# Patient Record
Sex: Male | Born: 1940 | Race: White | Hispanic: No | Marital: Married | Smoking: Former smoker
Health system: Southern US, Community
[De-identification: ages and names within clinical notes are randomized; demographics above are authoritative.]

## PROBLEM LIST (undated history)

## (undated) DIAGNOSIS — J189 Pneumonia, unspecified organism: Secondary | ICD-10-CM

## (undated) DIAGNOSIS — K259 Gastric ulcer, unspecified as acute or chronic, without hemorrhage or perforation: Secondary | ICD-10-CM

## (undated) DIAGNOSIS — N2589 Other disorders resulting from impaired renal tubular function: Secondary | ICD-10-CM

## (undated) DIAGNOSIS — I82409 Acute embolism and thrombosis of unspecified deep veins of unspecified lower extremity: Secondary | ICD-10-CM

## (undated) DIAGNOSIS — I10 Essential (primary) hypertension: Secondary | ICD-10-CM

## (undated) DIAGNOSIS — T7840XA Allergy, unspecified, initial encounter: Secondary | ICD-10-CM

## (undated) DIAGNOSIS — I219 Acute myocardial infarction, unspecified: Secondary | ICD-10-CM

## (undated) DIAGNOSIS — J449 Chronic obstructive pulmonary disease, unspecified: Secondary | ICD-10-CM

## (undated) DIAGNOSIS — R011 Cardiac murmur, unspecified: Secondary | ICD-10-CM

## (undated) DIAGNOSIS — M810 Age-related osteoporosis without current pathological fracture: Secondary | ICD-10-CM

## (undated) DIAGNOSIS — IMO0001 Reserved for inherently not codable concepts without codable children: Secondary | ICD-10-CM

## (undated) DIAGNOSIS — N184 Chronic kidney disease, stage 4 (severe): Secondary | ICD-10-CM

## (undated) DIAGNOSIS — I209 Angina pectoris, unspecified: Secondary | ICD-10-CM

## (undated) DIAGNOSIS — F419 Anxiety disorder, unspecified: Secondary | ICD-10-CM

## (undated) DIAGNOSIS — I2699 Other pulmonary embolism without acute cor pulmonale: Secondary | ICD-10-CM

## (undated) DIAGNOSIS — G709 Myoneural disorder, unspecified: Secondary | ICD-10-CM

## (undated) DIAGNOSIS — I059 Rheumatic mitral valve disease, unspecified: Secondary | ICD-10-CM

## (undated) DIAGNOSIS — D649 Anemia, unspecified: Secondary | ICD-10-CM

## (undated) DIAGNOSIS — I639 Cerebral infarction, unspecified: Secondary | ICD-10-CM

## (undated) DIAGNOSIS — K909 Intestinal malabsorption, unspecified: Secondary | ICD-10-CM

## (undated) DIAGNOSIS — H269 Unspecified cataract: Secondary | ICD-10-CM

## (undated) DIAGNOSIS — I2510 Atherosclerotic heart disease of native coronary artery without angina pectoris: Secondary | ICD-10-CM

## (undated) DIAGNOSIS — D689 Coagulation defect, unspecified: Secondary | ICD-10-CM

## (undated) DIAGNOSIS — Z8719 Personal history of other diseases of the digestive system: Secondary | ICD-10-CM

## (undated) DIAGNOSIS — Z5189 Encounter for other specified aftercare: Secondary | ICD-10-CM

## (undated) DIAGNOSIS — R17 Unspecified jaundice: Secondary | ICD-10-CM

## (undated) DIAGNOSIS — I509 Heart failure, unspecified: Secondary | ICD-10-CM

## (undated) DIAGNOSIS — R0602 Shortness of breath: Secondary | ICD-10-CM

## (undated) DIAGNOSIS — D500 Iron deficiency anemia secondary to blood loss (chronic): Secondary | ICD-10-CM

## (undated) DIAGNOSIS — E7800 Pure hypercholesterolemia, unspecified: Secondary | ICD-10-CM

## (undated) DIAGNOSIS — D631 Anemia in chronic kidney disease: Secondary | ICD-10-CM

## (undated) DIAGNOSIS — G4730 Sleep apnea, unspecified: Secondary | ICD-10-CM

## (undated) HISTORY — PX: CATARACT EXTRACTION W/ INTRAOCULAR LENS  IMPLANT, BILATERAL: SHX1307

## (undated) HISTORY — DX: Myoneural disorder, unspecified: G70.9

## (undated) HISTORY — DX: Coagulation defect, unspecified: D68.9

## (undated) HISTORY — DX: Iron deficiency anemia secondary to blood loss (chronic): D50.0

## (undated) HISTORY — DX: Intestinal malabsorption, unspecified: K90.9

## (undated) HISTORY — DX: Atherosclerotic heart disease of native coronary artery without angina pectoris: I25.10

## (undated) HISTORY — DX: Chronic obstructive pulmonary disease, unspecified: J44.9

## (undated) HISTORY — DX: Chronic kidney disease, stage 4 (severe): N18.4

## (undated) HISTORY — DX: Anemia in chronic kidney disease: D63.1

## (undated) HISTORY — DX: Age-related osteoporosis without current pathological fracture: M81.0

## (undated) HISTORY — DX: Allergy, unspecified, initial encounter: T78.40XA

## (undated) HISTORY — DX: Other disorders resulting from impaired renal tubular function: N25.89

## (undated) HISTORY — DX: Anxiety disorder, unspecified: F41.9

## (undated) HISTORY — DX: Unspecified cataract: H26.9

---

## 1985-12-03 DIAGNOSIS — I639 Cerebral infarction, unspecified: Secondary | ICD-10-CM

## 1985-12-03 HISTORY — DX: Cerebral infarction, unspecified: I63.9

## 1986-12-03 DIAGNOSIS — I219 Acute myocardial infarction, unspecified: Secondary | ICD-10-CM

## 1986-12-03 DIAGNOSIS — I2699 Other pulmonary embolism without acute cor pulmonale: Secondary | ICD-10-CM

## 1986-12-03 HISTORY — PX: CORONARY ANGIOPLASTY: SHX604

## 1986-12-03 HISTORY — DX: Other pulmonary embolism without acute cor pulmonale: I26.99

## 1986-12-03 HISTORY — DX: Acute myocardial infarction, unspecified: I21.9

## 1991-12-04 HISTORY — PX: EXPLORATORY LAPAROTOMY W/ BOWEL RESECTION: SHX1544

## 1991-12-04 HISTORY — PX: TRACHEOSTOMY: SUR1362

## 2001-12-03 HISTORY — PX: CORONARY ANGIOPLASTY WITH STENT PLACEMENT: SHX49

## 2002-08-11 ENCOUNTER — Inpatient Hospital Stay (HOSPITAL_COMMUNITY): Admission: EM | Admit: 2002-08-11 | Discharge: 2002-08-20 | Payer: Self-pay | Admitting: Cardiology

## 2002-09-23 ENCOUNTER — Inpatient Hospital Stay (HOSPITAL_COMMUNITY): Admission: EM | Admit: 2002-09-23 | Discharge: 2002-10-05 | Payer: Self-pay | Admitting: Cardiology

## 2002-09-26 ENCOUNTER — Encounter: Payer: Self-pay | Admitting: Cardiology

## 2002-09-27 ENCOUNTER — Encounter: Payer: Self-pay | Admitting: *Deleted

## 2006-02-12 ENCOUNTER — Ambulatory Visit (HOSPITAL_COMMUNITY): Admission: RE | Admit: 2006-02-12 | Discharge: 2006-02-12 | Payer: Self-pay | Admitting: Ophthalmology

## 2007-06-11 ENCOUNTER — Ambulatory Visit: Payer: Self-pay | Admitting: Physician Assistant

## 2007-06-13 ENCOUNTER — Ambulatory Visit: Payer: Self-pay | Admitting: Cardiology

## 2007-06-19 ENCOUNTER — Ambulatory Visit: Payer: Self-pay | Admitting: Cardiology

## 2007-07-01 ENCOUNTER — Ambulatory Visit: Payer: Self-pay | Admitting: Cardiology

## 2007-07-08 ENCOUNTER — Ambulatory Visit: Payer: Self-pay | Admitting: Cardiology

## 2007-07-30 ENCOUNTER — Ambulatory Visit: Payer: Self-pay | Admitting: Cardiology

## 2007-08-13 ENCOUNTER — Ambulatory Visit: Payer: Self-pay | Admitting: Cardiology

## 2007-08-21 ENCOUNTER — Ambulatory Visit: Payer: Self-pay | Admitting: Cardiology

## 2007-09-03 ENCOUNTER — Ambulatory Visit: Payer: Self-pay | Admitting: Cardiology

## 2007-09-18 ENCOUNTER — Ambulatory Visit: Payer: Self-pay | Admitting: Cardiology

## 2007-09-25 ENCOUNTER — Ambulatory Visit: Payer: Self-pay | Admitting: Cardiology

## 2007-10-09 ENCOUNTER — Ambulatory Visit: Payer: Self-pay | Admitting: Cardiology

## 2007-11-03 ENCOUNTER — Ambulatory Visit: Payer: Self-pay | Admitting: Cardiology

## 2007-11-17 ENCOUNTER — Ambulatory Visit: Payer: Self-pay | Admitting: Cardiology

## 2007-12-15 ENCOUNTER — Ambulatory Visit: Payer: Self-pay | Admitting: Cardiology

## 2007-12-29 ENCOUNTER — Ambulatory Visit: Payer: Self-pay | Admitting: Cardiology

## 2008-01-26 ENCOUNTER — Ambulatory Visit: Payer: Self-pay | Admitting: Cardiology

## 2008-02-16 ENCOUNTER — Ambulatory Visit: Payer: Self-pay | Admitting: Cardiology

## 2008-02-23 ENCOUNTER — Ambulatory Visit: Payer: Self-pay | Admitting: Cardiology

## 2008-03-22 ENCOUNTER — Encounter: Payer: Self-pay | Admitting: Cardiology

## 2008-03-23 ENCOUNTER — Ambulatory Visit: Payer: Self-pay | Admitting: Cardiology

## 2008-04-20 ENCOUNTER — Ambulatory Visit: Payer: Self-pay | Admitting: Cardiology

## 2008-05-19 ENCOUNTER — Ambulatory Visit: Payer: Self-pay | Admitting: Cardiology

## 2008-06-16 ENCOUNTER — Ambulatory Visit: Payer: Self-pay | Admitting: Cardiology

## 2008-07-08 ENCOUNTER — Ambulatory Visit: Payer: Self-pay | Admitting: Cardiology

## 2008-08-05 ENCOUNTER — Ambulatory Visit: Payer: Self-pay | Admitting: Cardiology

## 2008-09-03 ENCOUNTER — Ambulatory Visit: Payer: Self-pay | Admitting: Cardiology

## 2008-09-21 ENCOUNTER — Ambulatory Visit: Payer: Self-pay | Admitting: Cardiology

## 2008-09-26 ENCOUNTER — Encounter: Payer: Self-pay | Admitting: Cardiology

## 2008-09-27 ENCOUNTER — Encounter: Payer: Self-pay | Admitting: Cardiology

## 2008-10-11 ENCOUNTER — Encounter: Payer: Self-pay | Admitting: Cardiology

## 2008-10-19 ENCOUNTER — Ambulatory Visit: Payer: Self-pay | Admitting: Cardiology

## 2008-10-26 ENCOUNTER — Ambulatory Visit: Payer: Self-pay | Admitting: Cardiology

## 2008-10-27 ENCOUNTER — Encounter: Payer: Self-pay | Admitting: Cardiology

## 2008-11-05 ENCOUNTER — Ambulatory Visit: Payer: Self-pay | Admitting: Cardiology

## 2008-11-23 ENCOUNTER — Ambulatory Visit: Payer: Self-pay | Admitting: Cardiology

## 2008-12-03 DIAGNOSIS — J189 Pneumonia, unspecified organism: Secondary | ICD-10-CM

## 2008-12-03 HISTORY — DX: Pneumonia, unspecified organism: J18.9

## 2008-12-13 ENCOUNTER — Encounter: Payer: Self-pay | Admitting: Cardiology

## 2008-12-24 ENCOUNTER — Ambulatory Visit: Payer: Self-pay | Admitting: Cardiology

## 2009-01-21 ENCOUNTER — Ambulatory Visit: Payer: Self-pay | Admitting: Cardiology

## 2009-02-18 ENCOUNTER — Ambulatory Visit: Payer: Self-pay | Admitting: Cardiology

## 2009-03-17 ENCOUNTER — Encounter: Payer: Self-pay | Admitting: Cardiology

## 2009-03-22 ENCOUNTER — Ambulatory Visit: Payer: Self-pay | Admitting: Cardiology

## 2009-04-26 ENCOUNTER — Ambulatory Visit: Payer: Self-pay | Admitting: Cardiology

## 2009-05-10 ENCOUNTER — Ambulatory Visit: Payer: Self-pay

## 2009-05-27 ENCOUNTER — Ambulatory Visit: Payer: Self-pay

## 2009-06-24 ENCOUNTER — Ambulatory Visit: Payer: Self-pay

## 2009-07-13 ENCOUNTER — Encounter: Payer: Self-pay | Admitting: Cardiology

## 2009-07-18 ENCOUNTER — Encounter: Payer: Self-pay | Admitting: *Deleted

## 2009-07-26 ENCOUNTER — Ambulatory Visit: Payer: Self-pay | Admitting: Cardiology

## 2009-07-26 LAB — CONVERTED CEMR LAB
POC INR: 2.2
Prothrombin Time: 18.1 s

## 2009-08-16 DIAGNOSIS — I959 Hypotension, unspecified: Secondary | ICD-10-CM

## 2009-08-16 DIAGNOSIS — I2510 Atherosclerotic heart disease of native coronary artery without angina pectoris: Secondary | ICD-10-CM

## 2009-08-16 DIAGNOSIS — E785 Hyperlipidemia, unspecified: Secondary | ICD-10-CM

## 2009-08-23 ENCOUNTER — Ambulatory Visit: Payer: Self-pay | Admitting: Cardiology

## 2009-08-23 LAB — CONVERTED CEMR LAB: POC INR: 4.3

## 2009-09-09 ENCOUNTER — Ambulatory Visit: Payer: Self-pay | Admitting: Cardiology

## 2009-09-09 LAB — CONVERTED CEMR LAB: POC INR: 4.1

## 2009-09-20 ENCOUNTER — Ambulatory Visit: Payer: Self-pay | Admitting: Cardiology

## 2009-09-20 LAB — CONVERTED CEMR LAB: POC INR: 3.3

## 2009-09-27 ENCOUNTER — Encounter: Payer: Self-pay | Admitting: Cardiology

## 2009-10-11 ENCOUNTER — Ambulatory Visit: Payer: Self-pay | Admitting: Cardiology

## 2009-10-11 ENCOUNTER — Encounter (INDEPENDENT_AMBULATORY_CARE_PROVIDER_SITE_OTHER): Payer: Self-pay | Admitting: *Deleted

## 2009-10-11 ENCOUNTER — Encounter: Payer: Self-pay | Admitting: Cardiology

## 2009-10-11 DIAGNOSIS — N2589 Other disorders resulting from impaired renal tubular function: Secondary | ICD-10-CM

## 2009-10-11 DIAGNOSIS — I82409 Acute embolism and thrombosis of unspecified deep veins of unspecified lower extremity: Secondary | ICD-10-CM | POA: Insufficient documentation

## 2009-10-11 DIAGNOSIS — K55059 Acute (reversible) ischemia of intestine, part and extent unspecified: Secondary | ICD-10-CM

## 2009-10-11 DIAGNOSIS — J984 Other disorders of lung: Secondary | ICD-10-CM | POA: Insufficient documentation

## 2009-10-11 DIAGNOSIS — E119 Type 2 diabetes mellitus without complications: Secondary | ICD-10-CM | POA: Insufficient documentation

## 2009-10-11 DIAGNOSIS — I259 Chronic ischemic heart disease, unspecified: Secondary | ICD-10-CM | POA: Insufficient documentation

## 2009-10-11 DIAGNOSIS — G4733 Obstructive sleep apnea (adult) (pediatric): Secondary | ICD-10-CM | POA: Insufficient documentation

## 2009-10-11 DIAGNOSIS — I2510 Atherosclerotic heart disease of native coronary artery without angina pectoris: Secondary | ICD-10-CM | POA: Insufficient documentation

## 2009-10-11 DIAGNOSIS — Z86718 Personal history of other venous thrombosis and embolism: Secondary | ICD-10-CM

## 2009-10-12 ENCOUNTER — Encounter: Payer: Self-pay | Admitting: Cardiology

## 2009-10-20 ENCOUNTER — Encounter (INDEPENDENT_AMBULATORY_CARE_PROVIDER_SITE_OTHER): Payer: Self-pay | Admitting: *Deleted

## 2009-11-08 ENCOUNTER — Ambulatory Visit: Payer: Self-pay | Admitting: Cardiology

## 2009-11-08 LAB — CONVERTED CEMR LAB: POC INR: 3.5

## 2009-11-15 ENCOUNTER — Ambulatory Visit: Payer: Self-pay | Admitting: Cardiology

## 2009-11-15 ENCOUNTER — Encounter: Payer: Self-pay | Admitting: Cardiology

## 2009-11-16 ENCOUNTER — Encounter: Payer: Self-pay | Admitting: Cardiology

## 2009-11-22 ENCOUNTER — Encounter (INDEPENDENT_AMBULATORY_CARE_PROVIDER_SITE_OTHER): Payer: Self-pay | Admitting: *Deleted

## 2009-12-09 ENCOUNTER — Ambulatory Visit: Payer: Self-pay | Admitting: Cardiology

## 2009-12-09 LAB — CONVERTED CEMR LAB: POC INR: 2.2

## 2010-01-06 ENCOUNTER — Ambulatory Visit: Payer: Self-pay | Admitting: Cardiology

## 2010-01-06 LAB — CONVERTED CEMR LAB: POC INR: 2.4

## 2010-01-25 ENCOUNTER — Encounter: Payer: Self-pay | Admitting: Cardiology

## 2010-02-03 ENCOUNTER — Ambulatory Visit: Payer: Self-pay | Admitting: Cardiology

## 2010-02-03 LAB — CONVERTED CEMR LAB: POC INR: 2.4

## 2010-03-03 ENCOUNTER — Ambulatory Visit: Payer: Self-pay | Admitting: Cardiology

## 2010-03-03 LAB — CONVERTED CEMR LAB: POC INR: 1.4

## 2010-03-17 ENCOUNTER — Ambulatory Visit: Payer: Self-pay | Admitting: Cardiology

## 2010-04-14 ENCOUNTER — Ambulatory Visit: Payer: Self-pay | Admitting: Cardiology

## 2010-04-14 LAB — CONVERTED CEMR LAB: POC INR: 2.6

## 2010-04-17 ENCOUNTER — Encounter: Payer: Self-pay | Admitting: Physician Assistant

## 2010-05-03 ENCOUNTER — Encounter: Payer: Self-pay | Admitting: Cardiology

## 2010-05-12 ENCOUNTER — Ambulatory Visit: Payer: Self-pay | Admitting: Cardiology

## 2010-06-09 ENCOUNTER — Ambulatory Visit: Payer: Self-pay | Admitting: Cardiology

## 2010-07-14 ENCOUNTER — Ambulatory Visit: Payer: Self-pay | Admitting: Cardiology

## 2010-08-04 ENCOUNTER — Ambulatory Visit: Payer: Self-pay | Admitting: Cardiology

## 2010-08-04 LAB — CONVERTED CEMR LAB: POC INR: 2.7

## 2010-08-23 ENCOUNTER — Encounter: Payer: Self-pay | Admitting: Cardiology

## 2010-09-05 ENCOUNTER — Ambulatory Visit: Payer: Self-pay | Admitting: Cardiology

## 2010-09-05 LAB — CONVERTED CEMR LAB: POC INR: 3.3

## 2010-10-02 ENCOUNTER — Telehealth (INDEPENDENT_AMBULATORY_CARE_PROVIDER_SITE_OTHER): Payer: Self-pay | Admitting: *Deleted

## 2010-10-03 ENCOUNTER — Ambulatory Visit: Payer: Self-pay | Admitting: Cardiology

## 2010-10-13 ENCOUNTER — Ambulatory Visit: Payer: Self-pay | Admitting: Cardiology

## 2010-10-13 ENCOUNTER — Encounter: Payer: Self-pay | Admitting: Cardiology

## 2010-10-13 DIAGNOSIS — I4891 Unspecified atrial fibrillation: Secondary | ICD-10-CM

## 2010-10-20 ENCOUNTER — Ambulatory Visit: Payer: Self-pay | Admitting: Cardiology

## 2010-10-23 ENCOUNTER — Ambulatory Visit: Payer: Self-pay | Admitting: Cardiology

## 2010-10-27 ENCOUNTER — Ambulatory Visit: Payer: Self-pay | Admitting: Cardiology

## 2010-10-31 ENCOUNTER — Encounter (INDEPENDENT_AMBULATORY_CARE_PROVIDER_SITE_OTHER): Payer: Self-pay | Admitting: *Deleted

## 2010-10-31 ENCOUNTER — Encounter: Payer: Self-pay | Admitting: Cardiology

## 2010-11-02 HISTORY — PX: OTHER SURGICAL HISTORY: SHX169

## 2010-11-03 ENCOUNTER — Ambulatory Visit: Payer: Self-pay | Admitting: Cardiology

## 2010-11-03 LAB — CONVERTED CEMR LAB: POC INR: 3

## 2010-11-07 ENCOUNTER — Ambulatory Visit: Payer: Self-pay | Admitting: Cardiology

## 2010-11-07 LAB — CONVERTED CEMR LAB: POC INR: 2.7

## 2010-11-14 ENCOUNTER — Ambulatory Visit: Payer: Self-pay | Admitting: Cardiology

## 2010-11-14 LAB — CONVERTED CEMR LAB: POC INR: 3.3

## 2010-11-24 ENCOUNTER — Encounter (INDEPENDENT_AMBULATORY_CARE_PROVIDER_SITE_OTHER): Payer: Self-pay | Admitting: *Deleted

## 2010-11-24 ENCOUNTER — Ambulatory Visit: Payer: Self-pay | Admitting: Cardiology

## 2010-11-24 ENCOUNTER — Encounter: Payer: Self-pay | Admitting: Physician Assistant

## 2010-11-24 DIAGNOSIS — I5022 Chronic systolic (congestive) heart failure: Secondary | ICD-10-CM | POA: Insufficient documentation

## 2010-11-26 ENCOUNTER — Encounter: Payer: Self-pay | Admitting: Cardiology

## 2010-11-27 ENCOUNTER — Encounter: Payer: Self-pay | Admitting: Cardiology

## 2010-11-28 ENCOUNTER — Encounter: Payer: Self-pay | Admitting: Cardiology

## 2010-11-29 ENCOUNTER — Encounter: Payer: Self-pay | Admitting: Cardiology

## 2010-11-30 ENCOUNTER — Encounter: Payer: Self-pay | Admitting: Cardiology

## 2010-12-01 ENCOUNTER — Ambulatory Visit: Payer: Self-pay | Admitting: Cardiology

## 2010-12-02 ENCOUNTER — Encounter: Payer: Self-pay | Admitting: Cardiology

## 2010-12-05 ENCOUNTER — Encounter: Payer: Self-pay | Admitting: Cardiology

## 2010-12-05 ENCOUNTER — Telehealth (INDEPENDENT_AMBULATORY_CARE_PROVIDER_SITE_OTHER): Payer: Self-pay | Admitting: *Deleted

## 2010-12-07 ENCOUNTER — Ambulatory Visit: Admission: RE | Admit: 2010-12-07 | Discharge: 2010-12-07 | Payer: Self-pay | Source: Home / Self Care

## 2010-12-07 ENCOUNTER — Encounter: Payer: Self-pay | Admitting: Internal Medicine

## 2010-12-07 ENCOUNTER — Encounter: Payer: Self-pay | Admitting: Cardiology

## 2010-12-08 ENCOUNTER — Telehealth: Payer: Self-pay | Admitting: Cardiology

## 2010-12-08 ENCOUNTER — Encounter: Payer: Self-pay | Admitting: Cardiology

## 2010-12-11 ENCOUNTER — Encounter: Payer: Self-pay | Admitting: Cardiology

## 2010-12-11 ENCOUNTER — Telehealth: Payer: Self-pay | Admitting: Cardiology

## 2010-12-11 LAB — CONVERTED CEMR LAB: POC INR: 2.9

## 2010-12-12 ENCOUNTER — Encounter: Payer: Self-pay | Admitting: Cardiology

## 2010-12-13 ENCOUNTER — Encounter: Payer: Self-pay | Admitting: Cardiology

## 2010-12-13 ENCOUNTER — Telehealth (INDEPENDENT_AMBULATORY_CARE_PROVIDER_SITE_OTHER): Payer: Self-pay | Admitting: *Deleted

## 2010-12-13 ENCOUNTER — Encounter: Payer: Self-pay | Admitting: Physician Assistant

## 2010-12-14 ENCOUNTER — Telehealth: Payer: Self-pay | Admitting: Cardiology

## 2010-12-14 ENCOUNTER — Encounter: Payer: Self-pay | Admitting: Cardiology

## 2010-12-14 LAB — CONVERTED CEMR LAB: POC INR: 2.8

## 2010-12-15 ENCOUNTER — Encounter: Payer: Self-pay | Admitting: Cardiology

## 2010-12-20 ENCOUNTER — Encounter: Payer: Self-pay | Admitting: Cardiology

## 2010-12-20 ENCOUNTER — Telehealth: Payer: Self-pay | Admitting: Cardiology

## 2010-12-21 ENCOUNTER — Encounter: Payer: Self-pay | Admitting: Cardiology

## 2010-12-21 ENCOUNTER — Ambulatory Visit
Admission: RE | Admit: 2010-12-21 | Discharge: 2010-12-21 | Payer: Self-pay | Source: Home / Self Care | Attending: Cardiology | Admitting: Cardiology

## 2010-12-21 ENCOUNTER — Encounter (INDEPENDENT_AMBULATORY_CARE_PROVIDER_SITE_OTHER): Payer: Self-pay | Admitting: *Deleted

## 2010-12-21 DIAGNOSIS — Z9861 Coronary angioplasty status: Secondary | ICD-10-CM | POA: Insufficient documentation

## 2010-12-21 DIAGNOSIS — R0689 Other abnormalities of breathing: Secondary | ICD-10-CM

## 2010-12-21 DIAGNOSIS — R0600 Dyspnea, unspecified: Secondary | ICD-10-CM | POA: Insufficient documentation

## 2010-12-22 ENCOUNTER — Encounter: Payer: Self-pay | Admitting: Cardiology

## 2010-12-22 ENCOUNTER — Telehealth: Payer: Self-pay | Admitting: Cardiology

## 2010-12-26 ENCOUNTER — Telehealth (INDEPENDENT_AMBULATORY_CARE_PROVIDER_SITE_OTHER): Payer: Self-pay | Admitting: *Deleted

## 2010-12-28 ENCOUNTER — Encounter (INDEPENDENT_AMBULATORY_CARE_PROVIDER_SITE_OTHER): Payer: Self-pay | Admitting: *Deleted

## 2010-12-28 ENCOUNTER — Ambulatory Visit
Admission: RE | Admit: 2010-12-28 | Discharge: 2010-12-28 | Payer: Self-pay | Source: Home / Self Care | Attending: Cardiology | Admitting: Cardiology

## 2010-12-28 ENCOUNTER — Inpatient Hospital Stay: Admission: AD | Admit: 2010-12-28 | Payer: Self-pay | Source: Home / Self Care | Admitting: Cardiology

## 2010-12-29 ENCOUNTER — Encounter: Payer: Self-pay | Admitting: Cardiology

## 2010-12-31 LAB — CONVERTED CEMR LAB
POC INR: 2.1
POC INR: 2.5
POC INR: 3.3

## 2011-01-01 ENCOUNTER — Encounter: Payer: Self-pay | Admitting: Cardiology

## 2011-01-02 ENCOUNTER — Ambulatory Visit: Admission: RE | Admit: 2011-01-02 | Discharge: 2011-01-02 | Payer: Self-pay | Source: Home / Self Care

## 2011-01-02 ENCOUNTER — Encounter: Payer: Self-pay | Admitting: Cardiology

## 2011-01-02 NOTE — Medication Information (Signed)
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 no    Recent/future dental: no  Any missed doses?: no       Is patient compliant with meds? yes       Allergies: 1)  ! Tylenol 2)  Sulfa  Anticoagulation Management History:      The patient is taking warfarin and comes in today for a routine follow up visit.  Positive risk factors for bleeding include an age of 72 years or older and presence of serious comorbidities.  The bleeding index is 'intermediate risk'.  Positive CHADS2 values include History of Diabetes.  Negative CHADS2 values include Age > 104 years old.  The start date was 12/03/1985.  Anticoagulation responsible provider: Diona Browner MD, Remi Deter.  INR POC: 1.4.  Cuvette Lot#: [id number].  Exp: 10/11.    Anticoagulation Management Assessment/Plan:      The patient's current anticoagulation dose is Warfarin sodium 5 mg tabs: take as directed per coumadin clinic.  The target INR is 2 - 3.  The next INR is due 03/17/2010.  Anticoagulation instructions were given to patient.  Results were reviewed/authorized by Vashti Hey, RN.  He was notified by Vashti Hey RN.         Prior Anticoagulation Instructions: INR 2.4 Continue coumadin 2.5mg  once daily except 5mg  on Mondays and Fridays  Current Anticoagulation Instructions: INR 1.4 Take coumadin 10mg  tonight then resume 2.5mg  once daily except 5mg  on Mondays and Fridays States he had been taken off ASA by Dermatologist but has started back on it recently.

## 2011-01-02 NOTE — Medication Information (Signed)
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 no    Recent/future dental: no  Any missed doses?: no       Is patient compliant with meds? yes       Allergies: 1)  ! Tylenol 2)  Sulfa  Anticoagulation Management History:      The patient is taking warfarin and comes in today for a routine follow up visit.  Positive risk factors for bleeding include an age of 23 years or older and presence of serious comorbidities.  The bleeding index is 'intermediate risk'.  Positive CHADS2 values include History of Diabetes.  Negative CHADS2 values include Age > 52 years old.  The start date was 12/03/1985.  Anticoagulation responsible provider: Diona Browner MD, Remi Deter.  INR POC: 2.2.  Cuvette Lot#: [id number].  Exp: 10/11.    Anticoagulation Management Assessment/Plan:      The patient's current anticoagulation dose is Warfarin sodium 5 mg tabs: take as directed per coumadin clinic.  The target INR is 2 - 3.  The next INR is due 01/06/2010.  Anticoagulation instructions were given to patient.  Results were reviewed/authorized by Vashti Hey, RN.  He was notified by Vashti Hey RN.         Prior Anticoagulation Instructions: INR 3.5 Hold coumadin x 2 then resume 2.5mg  once daily except 5mg  on Mondays and Fridays Has another 1 1/2 weeks on Antibiotic  Current Anticoagulation Instructions: INR 2.2  Continue coumadin 2.5mg  once daily except 5mg  on Mondays and Fridays

## 2011-01-02 NOTE — Medication Information (Signed)
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 no    Recent/future dental: no  Any missed doses?: no       Is patient compliant with meds? yes       Allergies: 1)  ! Tylenol 2)  Sulfa  Anticoagulation Management History:      The patient is taking warfarin and comes in today for a routine follow up visit.  Positive risk factors for bleeding include an age of 10 years or older and presence of serious comorbidities.  The bleeding index is 'intermediate risk'.  Positive CHADS2 values include History of Diabetes.  Negative CHADS2 values include Age > 87 years old.  The start date was 12/03/1985.  Anticoagulation responsible provider: Diona Browner MD, Remi Deter.  INR POC: 1.7.  Cuvette Lot#: [id number].  Exp: 10/11.    Anticoagulation Management Assessment/Plan:      The patient's current anticoagulation dose is Warfarin sodium 5 mg tabs: take as directed per coumadin clinic.  The target INR is 2 - 3.  The next INR is due 06/30/2010.  Anticoagulation instructions were given to patient.  Results were reviewed/authorized by Vashti Hey, RN.  He was notified by Vashti Hey RN.         Prior Anticoagulation Instructions: INR 2.0 Continue coumadin 2.5mg  once daily except 5mg  on Mondays and Fridays  Current Anticoagulation Instructions: INR 1.7 Take coumadin 1 1/2 tablets tonight, 1 tablet tomorrow night then resume 1/2 tablet once daily except 1 tablet on Mondays and Fridays

## 2011-01-02 NOTE — Progress Notes (Signed)
Summary: sob with exertion      Phone Note Call from Patient   Summary of Call: At beach appro[place] one [place] ago. Got extremely SOB with going up steps.  Had OV with PMD whe he returned.  Did labs and was told to doulble up on fluid meds.  Did feel some better.  Then went back for 2 wk check after that.  Does notice some chest tightness with exertion also.  Not feeing good last couple of months, but seems to be getting worse.   Has OV scheduled for 11/22. Initial call taken by: [name], LPN,  October 02, 2010 11:26 AM  Follow-up for Phone Call        If he gets worse he needs to go to ER. I am not sure where we are  going to work him in before that time. Could you look at schedule and maybe add on split day or [facility] day this week. Not tomorrow, because Gene not here.  Follow-up by: Lewayne Bunting, MD, [facility],  October 02, 2010 2:03 PM  Additional Follow-up for Phone Call Additional follow up Details #1::        Left message to return call.  [name], LPN  October 03, 2010 4:05 PM   Left message to return call.  [name], LPN  October 04, 2010 1:58 PM   patient returned call. He has been out of town Claudette Laws  October 05, 2010 1:41 PM    Additional Follow-up for Phone Call Additional follow up Details #2::    Patient notified.   OV scheduled for Tuesday, November 8 at 8:30.   [name], LPN  October 06, 2010 11:27 AM

## 2011-01-02 NOTE — Progress Notes (Signed)
Summary: BP READINGS  BP READINGS   Imported By: Zachary George 10/27/2010 09:40:07  _____________________________________________________________________  External Attachment:    Type:   Image     Comment:   External Document

## 2011-01-02 NOTE — Medication Information (Signed)
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 no    Recent/future dental: no  Any missed doses?: no       Is patient compliant with meds? yes       Allergies: 1)  ! Tylenol 2)  Sulfa  Anticoagulation Management History:      The patient is taking warfarin and comes in today for a routine follow up visit.  Positive risk factors for bleeding include an age of 9 years or older and presence of serious comorbidities.  The bleeding index is 'intermediate risk'.  Positive CHADS2 values include History of Diabetes.  Negative CHADS2 values include Age > 77 years old.  The start date was 12/03/1985.  Anticoagulation responsible provider: Myrtis Ser MD, Tinnie Gens.  INR POC: 2.6.  Cuvette Lot#: [id number].  Exp: 10/11.    Anticoagulation Management Assessment/Plan:      The patient's current anticoagulation dose is Warfarin sodium 5 mg tabs: take as directed per coumadin clinic.  The target INR is 2 - 3.  The next INR is due 05/12/2010.  Anticoagulation instructions were given to patient.  Results were reviewed/authorized by Vashti Hey, RN.  He was notified by Vashti Hey RN.         Prior Anticoagulation Instructions: INR 2.1 Continue coumadin 2.5mg  once daily except 5mg  on Mondays and Fridays  Current Anticoagulation Instructions: INR 2.6 Continue coumadin 2.5mg  once daily except 5mg  on Mondays and Fridays

## 2011-01-02 NOTE — Miscellaneous (Signed)
Summary: Orders Update  Clinical Lists Changes  Orders: Added new Referral order of 2-D Echocardiogram (2D Echo) - Signed 

## 2011-01-02 NOTE — Medication Information (Signed)
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 no    Recent/future dental: no  Any missed doses?: no       Is patient compliant with meds? yes       Allergies: 1)  ! Tylenol 2)  Sulfa  Anticoagulation Management History:      The patient is taking warfarin and comes in today for a routine follow up visit.  Positive risk factors for bleeding include an age of 72 years or older and presence of serious comorbidities.  The bleeding index is 'intermediate risk'.  Positive CHADS2 values include History of Diabetes.  Negative CHADS2 values include Age > 24 years old.  The start date was 12/03/1985.  Anticoagulation responsible provider: Andee Lineman MD, Michelle Piper.  INR POC: 2.1.  Cuvette Lot#: [id number].  Exp: 10/11.    Anticoagulation Management Assessment/Plan:      The patient's current anticoagulation dose is Warfarin sodium 5 mg tabs: take as directed per coumadin clinic.  The target INR is 2 - 3.  The next INR is due 04/14/2010.  Anticoagulation instructions were given to patient.  Results were reviewed/authorized by Vashti Hey, RN.  He was notified by Vashti Hey RN.         Prior Anticoagulation Instructions: INR 1.4 Take coumadin 10mg  tonight then resume 2.5mg  once daily except 5mg  on Mondays and Fridays States he had been taken off ASA by Dermatologist but has started back on it recently.  Current Anticoagulation Instructions: INR 2.1 Continue coumadin 2.5mg  once daily except 5mg  on Mondays and Fridays

## 2011-01-02 NOTE — Medication Information (Signed)
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 no    Recent/future dental: no  Any missed doses?: no       Is patient compliant with meds? yes       Allergies: 1)  ! Tylenol 2)  Sulfa  Anticoagulation Management History:      The patient is taking warfarin and comes in today for a routine follow up visit.  Positive risk factors for bleeding include an age of 72 years or older and presence of serious comorbidities.  The bleeding index is 'intermediate risk'.  Positive CHADS2 values include History of Diabetes.  Negative CHADS2 values include Age > 85 years old.  The start date was 12/03/1985.  Anticoagulation responsible provider: Andee Lineman MD, Michelle Piper.  INR POC: 4.1.  Cuvette Lot#: [id number].  Exp: 10/11.    Anticoagulation Management Assessment/Plan:      The patient's current anticoagulation dose is Warfarin sodium 5 mg tabs: take as directed per coumadin clinic.  The target INR is 2 - 3.  The next INR is due 10/13/2010.  Anticoagulation instructions were given to patient.  Results were reviewed/authorized by Hayden Hey, RN.  He was notified by Hayden Hey RN.         Prior Anticoagulation Instructions: INR 3.3 Hold coumadin tonight then resume 2.5mg  once daily except 5mg  on M,W,F  Current Anticoagulation Instructions: INR 4.1 Hold coumadin tonight then decrease dose to 2.5mg  once daily except 5mg  on Mondays and Fridays Furosemide dose has been doubled

## 2011-01-02 NOTE — Medication Information (Signed)
Summary: ccr-lr  Anticoagulant Therapy  Managed by: Vashti Hey, RN PCP: Dr. 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 yes       Details: 11/01/10  Started amiodarone 400mg  x 1 month then 200mg  after that  Recent/future dental: no  Any missed doses?: no       Is patient compliant with meds? yes       Allergies: 1)  Sulfa  Anticoagulation Management History:      The patient is taking warfarin and comes in today for a routine follow up visit.  Positive risk factors for bleeding include an age of 25 years or older and presence of serious comorbidities.  The bleeding index is 'intermediate risk'.  Positive CHADS2 values include History of Diabetes.  Negative CHADS2 values include Age > 24 years old.  The start date was 12/03/1985.  Anticoagulation responsible Mayci Haning: Andee Lineman MD, Michelle Piper.  INR POC: 3.0.  Cuvette Lot#: [id number].  Exp: 10/11.    Anticoagulation Management Assessment/Plan:      The patient's current anticoagulation dose is Warfarin sodium 5 mg tabs: take as directed per coumadin clinic.  The target INR is 2 - 3.  The next INR is due 11/07/2010.  Anticoagulation instructions were given to patient.  Results were reviewed/authorized by Vashti Hey, RN.  He was notified by Vashti Hey RN.        Coagulation management information includes: 11/01/10 pending DCCV after 4 weeks on amiodarone and INRs    11/01/10  Started amiodarone 400mg  x 1 month then 200mg  after that.  Prior Anticoagulation Instructions: INR 2.5  ( 1st post DCCV) Continue coumadin 2.5mg  once daily except 5mg   on Mondays and Fridays  Current Anticoagulation Instructions: INR 3.0  (1st on amiodarone) Decrease coumadin to 2.5mg  once daily except 5mg  on Mondays

## 2011-01-02 NOTE — Medication Information (Signed)
Summary: RX Folder/ [facility]  RX Folder/ [facility]   Imported By: Dorise Hiss 04/18/2010 09:24:15  _____________________________________________________________________  External Attachment:    Type:   Image     Comment:   External Document

## 2011-01-02 NOTE — Miscellaneous (Signed)
Summary: Orders Update- cardioversion  Clinical Lists Changes  Orders: Added new Referral order of Cardioversion (Cardioversion) - Signed

## 2011-01-02 NOTE — Medication Information (Signed)
Summary: ccr-lr  Anticoagulant Therapy  Managed by: Vashti Hey, RN PCP: Dr. 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 Site: [place] INR POC 2.7  Dietary changes: no    Health status changes: no    Bleeding/hemorrhagic complications: no    Recent/future hospitalizations: yes       Details: pending DCCV  Any changes in medication regimen? no    Recent/future dental: no  Any missed doses?: no       Is patient compliant with meds? yes       Allergies: 1)  Sulfa  Anticoagulation Management History:      The patient is taking warfarin and comes in today for a routine follow up visit.  Positive risk factors for bleeding include an age of 72 years or older and presence of serious comorbidities.  The bleeding index is 'intermediate risk'.  Positive CHADS2 values include History of Diabetes.  Negative CHADS2 values include Age > 65 years old.  The start date was 12/03/1985.  Anticoagulation responsible provider: Antoine Poche MD, Fayrene Fearing.  INR POC: 2.7.  Cuvette Lot#: [id number].  Exp: 10/11.    Anticoagulation Management Assessment/Plan:      The patient's current anticoagulation dose is Warfarin sodium 5 mg tabs: take as directed per coumadin clinic.  The target INR is 2 - 3.  The next INR is due 11/14/2010.  Anticoagulation instructions were given to patient.  Results were reviewed/authorized by Vashti Hey, RN.  He was notified by Vashti Hey RN.         Prior Anticoagulation Instructions: INR 3.0  (1st on amiodarone) Decrease coumadin to 2.5mg  once daily except 5mg  on Mondays   Current Anticoagulation Instructions: INR 2.7  (2nd DCCV/on amioadrone) Continue coumadin 2.5mg  once daily except 5mg  on Mondays

## 2011-01-02 NOTE — Medication Information (Signed)
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 no    Recent/future dental: no  Any missed doses?: no       Is patient compliant with meds? yes       Allergies: 1)  ! Tylenol 2)  Sulfa  Anticoagulation Management History:      The patient is taking warfarin and comes in today for a routine follow up visit.  Positive risk factors for bleeding include an age of 72 years or older and presence of serious comorbidities.  The bleeding index is 'intermediate risk'.  Positive CHADS2 values include History of Diabetes.  Negative CHADS2 values include Age > 49 years old.  The start date was 12/03/1985.  Anticoagulation responsible provider: Andee Lineman MD, Michelle Piper.  INR POC: 3.3.  Cuvette Lot#: [id number].  Exp: 10/11.    Anticoagulation Management Assessment/Plan:      The patient's current anticoagulation dose is Warfarin sodium 5 mg tabs: take as directed per coumadin clinic.  The target INR is 2 - 3.  The next INR is due 11/03/2010.  Anticoagulation instructions were given to patient.  Results were reviewed/authorized by Vashti Hey, RN.  He was notified by Vashti Hey RN.         Prior Anticoagulation Instructions: INR 4.1 Hold coumadin tonight then decrease dose to 2.5mg  once daily except 5mg  on Mondays and Fridays Furosemide dose has been doubled  Current Anticoagulation Instructions: INR 3.3 Take coumadin 1/2 tablet tonight then resume 1/2 tablet once daily except 1 tablet on Mondays and Fridays

## 2011-01-02 NOTE — Letter (Signed)
Summary: Generic Engineer, agricultural at [facility] [address]   [place], [place] [address]   Phone: [phone]  Fax: [phone]        October 31, 2010 MRN: [id number]    [facility] [address] [place], [place]  [address]      1.  Start Amiodarone 400mg  daily for one month 2.  Decrease to 200mg  daily after above.    3.  Follow PT INR more frequently with amiodarone  4.  Reschedule patient for cardioversion after 4 weeks on Amiodarone. 5.  Simvastatin need to be discontinued due to interaction.  6.  Start Pravachol 40mg  at bedtime  Starting medication on Wednesday, 11/30 #1 PT check on Friday, 12/2           Sincerely,  [name], LPN  This letter has been electronically signed by your physician.

## 2011-01-02 NOTE — Medication Information (Signed)
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 no    Recent/future dental: no  Any missed doses?: no       Is patient compliant with meds? yes       Allergies: 1)  ! Tylenol 2)  Sulfa  Anticoagulation Management History:      The patient is taking warfarin and comes in today for a routine follow up visit.  Positive risk factors for bleeding include an age of 72 years or older and presence of serious comorbidities.  The bleeding index is 'intermediate risk'.  Positive CHADS2 values include History of Diabetes.  Negative CHADS2 values include Age > 98 years old.  The start date was 12/03/1985.  Anticoagulation responsible provider: Diona Browner MD, Remi Deter.  INR POC: 2.0.  Cuvette Lot#: [id number].  Exp: 10/11.    Anticoagulation Management Assessment/Plan:      The patient's current anticoagulation dose is Warfarin sodium 5 mg tabs: take as directed per coumadin clinic.  The target INR is 2 - 3.  The next INR is due 06/09/2010.  Anticoagulation instructions were given to patient.  Results were reviewed/authorized by Vashti Hey, RN.  He was notified by Vashti Hey RN.         Prior Anticoagulation Instructions: INR 2.6 Continue coumadin 2.5mg  once daily except 5mg  on Mondays and Fridays  Current Anticoagulation Instructions: INR 2.0 Continue coumadin 2.5mg  once daily except 5mg  on Mondays and Fridays

## 2011-01-02 NOTE — Medication Information (Signed)
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 yes       Details: Been on ceftin 500mg  bid x 10 days   Finishes today  and benzonatate 100mg  for cough  Recent/future dental: no  Any missed doses?: yes       Is patient compliant with meds? yes       Allergies: 1)  ! Tylenol 2)  Sulfa  Anticoagulation Management History:      The patient is taking warfarin and comes in today for a routine follow up visit.  Positive risk factors for bleeding include an age of 72 years or older and presence of serious comorbidities.  The bleeding index is 'intermediate risk'.  Positive CHADS2 values include History of Diabetes.  Negative CHADS2 values include Age > 66 years old.  The start date was 12/03/1985.  Anticoagulation responsible provider: Diona Browner MD, Remi Deter.  INR POC: 2.4.  Cuvette Lot#: [id number].  Exp: 10/11.    Anticoagulation Management Assessment/Plan:      The patient's current anticoagulation dose is Warfarin sodium 5 mg tabs: take as directed per coumadin clinic.  The target INR is 2 - 3.  The next INR is due 03/03/2010.  Anticoagulation instructions were given to patient.  Results were reviewed/authorized by Vashti Hey, RN.  He was notified by Vashti Hey RN.         Prior Anticoagulation Instructions: INR 2.4 Continue coumadin 2.5mg  once daily except 5mg  on Mondays and Fridays  Current Anticoagulation Instructions: Same as Prior Instructions.

## 2011-01-02 NOTE — Medication Information (Signed)
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 no    Recent/future dental: no  Any missed doses?: no       Is patient compliant with meds? yes       Allergies: 1)  ! Tylenol 2)  Sulfa  Anticoagulation Management History:      The patient is taking warfarin and comes in today for a routine follow up visit.  Positive risk factors for bleeding include an age of 70 years or older and presence of serious comorbidities.  The bleeding index is 'intermediate risk'.  Positive CHADS2 values include History of Diabetes.  Negative CHADS2 values include Age > 25 years old.  The start date was 12/03/1985.  Anticoagulation responsible provider: Myrtis Ser MD, [name].  INR POC: 3.3.  Cuvette Lot#: [id number].  Exp: 10/11.    Anticoagulation Management Assessment/Plan:      The patient's current anticoagulation dose is Warfarin sodium 5 mg tabs: take as directed per coumadin clinic.  The target INR is 2 - 3.  The next INR is due 10/03/2010.  Anticoagulation instructions were given to patient.  Results were reviewed/authorized by Hayden Hey, RN.  He was notified by Hayden Hey RN.         Prior Anticoagulation Instructions: INR 2.7 Continue coumadin 2.5mg  once daily except 5mg  on M,W,F  Current Anticoagulation Instructions: INR 3.3 Hold coumadin tonight then resume 2.5mg  once daily except 5mg  on M,W,F

## 2011-01-02 NOTE — Assessment & Plan Note (Signed)
Summary: ccr per degent  Nurse Visit  Comments EKG done (see scanned in).   INR - 2.5.  Recheck on 12/2.  See also blood pressure readings scanned in. [name], LPN  October 27, 2010 9:33 AM    Allergies: 1)  Sulfa Start amiodarone 400 mg daily for one month then change to 200 mg daily. Follow PT INR more frequently with amiodarone and reschedule patient for cardioversion after 4 weeks on amiodarone.Simvastatin last to be discontinued due to interaction. Start Pravachol 40 mg p.o. at bedtime  Patient notified.   Will send rx to [facility].  Next PT check is 12/2. [name], LPN  October 31, 2010 1:25 PM

## 2011-01-02 NOTE — Miscellaneous (Signed)
Summary: rx - pravastatin, amiodarone  Clinical Lists Changes  Medications: Added new medication of AMIODARONE HCL 400 MG TABS (AMIODARONE HCL) Take 1 tablet by mouth once a day x one month - Signed Changed medication from SIMVASTATIN 40 MG TABS (SIMVASTATIN) Take 1 tablet by mouth at bedtime to PRAVACHOL 40 MG TABS (PRAVASTATIN SODIUM) Take 1 tab by mouth at bedtime - Signed Added new medication of AMIODARONE HCL 200 MG TABS (AMIODARONE HCL) Take 1 tablet by mouth once a day - Signed Rx of AMIODARONE HCL 400 MG TABS (AMIODARONE HCL) Take 1 tablet by mouth once a day x one month;  #30 x 0;  Signed;  Entered by: [name], LPN;  Authorized by: Lewayne Bunting, MD, [facility];  Method used: Electronically to [facility]  [address] [place] [address], [place], [place], [place]  [address], Ph: [phone], Fax: [phone] Rx of PRAVACHOL 40 MG TABS (PRAVASTATIN SODIUM) Take 1 tab by mouth at bedtime;  #30 x 6;  Signed;  Entered by: [name], LPN;  Authorized by: Lewayne Bunting, MD, [facility];  Method used: Electronically to [facility]  [address] [place], [place], [place]  [address], Ph: [phone], Fax: [phone] Rx of AMIODARONE HCL 200 MG TABS (AMIODARONE HCL) Take 1 tablet by mouth once a day;  #30 x 6;  Signed;  Entered by: [name], LPN;  Authorized by: Lewayne Bunting, MD, [facility];  Method used: Faxed to [facility]  [address] [facility], [place], [place], [place]  [address], Ph: [phone], Fax: [phone]    Prescriptions: AMIODARONE HCL 200 MG TABS (AMIODARONE HCL) Take 1 tablet by mouth once a day  #30 x 6   Entered by:   [name], LPN   Authorized by:   Lewayne Bunting, MD, [facility]   Signed by:   [name], LPN on 43/32/9518   Method used:   Faxed to ...       [facility] (retail)       [address]       [place], [place]  [address]       Ph: [phone]       Fax: [phone]   RxID:   [id number] PRAVACHOL 40 MG TABS (PRAVASTATIN SODIUM) Take 1 tab by  mouth at bedtime  #30 x 6   Entered by:   [name], LPN   Authorized by:   Lewayne Bunting, MD, [facility]   Signed by:   [name], LPN on 17/61/6073   Method used:   Electronically to        [facility] (retail)       [address]       [place], [place]  [address]       Ph: [phone]       Fax: [phone]   RxID:   [id number] AMIODARONE HCL 400 MG TABS (AMIODARONE HCL) Take 1 tablet by mouth once a day x one month  #30 x 0   Entered by:   [name], LPN   Authorized by:   Lewayne Bunting, MD, [facility]   Signed by:   [name], LPN on 75/09/2584   Method used:   Electronically to        [facility] (retail)       [address]  [place], [place]  [address]       Ph: [phone]       Fax: [phone]   RxID:   [id number]

## 2011-01-02 NOTE — Miscellaneous (Signed)
Summary: Orders Update - bmet, pt/inr  Clinical Lists Changes  Orders: Added new Test order of T-Basic Metabolic Panel [phone]) - Signed Added new Test order of T-Protime, Auto ([id number]) - Signed

## 2011-01-02 NOTE — Medication Information (Signed)
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 no    Recent/future dental: no  Any missed doses?: no       Is patient compliant with meds? yes       Allergies: 1)  ! Tylenol 2)  Sulfa  Anticoagulation Management History:      The patient is taking warfarin and comes in today for a routine follow up visit.  Positive risk factors for bleeding include an age of 72 years or older and presence of serious comorbidities.  The bleeding index is 'intermediate risk'.  Positive CHADS2 values include History of Diabetes.  Negative CHADS2 values include Age > 72 years old.  The start date was 12/03/1985.  Anticoagulation responsible provider: Myrtis Ser MD, Tinnie Gens.  INR POC: 1.7.  Cuvette Lot#: [id number].  Exp: 10/11.    Anticoagulation Management Assessment/Plan:      The patient's current anticoagulation dose is Warfarin sodium 5 mg tabs: take as directed per coumadin clinic.  The target INR is 2 - 3.  The next INR is due 08/04/2010.  Anticoagulation instructions were given to patient.  Results were reviewed/authorized by Vashti Hey, RN.  He was notified by Vashti Hey RN.         Prior Anticoagulation Instructions: INR 1.7 Take coumadin 1 1/2 tablets tonight, 1 tablet tomorrow night then resume 1/2 tablet once daily except 1 tablet on Mondays and Fridays  Current Anticoagulation Instructions: INR 1.7 Take coumadin 7.5mg  tonight then increase dose to 2.5mg  once daily except 5mg  on Mondays, Wednesdays and Fridays

## 2011-01-02 NOTE — Medication Information (Signed)
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 no    Recent/future dental: no  Any missed doses?: no       Is patient compliant with meds? yes       Allergies: 1)  ! Tylenol 2)  Sulfa  Anticoagulation Management History:      The patient is taking warfarin and comes in today for a routine follow up visit.  Positive risk factors for bleeding include an age of 72 years or older and presence of serious comorbidities.  The bleeding index is 'intermediate risk'.  Positive CHADS2 values include History of Diabetes.  Negative CHADS2 values include Age > 71 years old.  The start date was 12/03/1985.  Anticoagulation responsible provider: Diona Browner MD, Remi Deter.  INR POC: 2.4.  Cuvette Lot#: [id number].  Exp: 10/11.    Anticoagulation Management Assessment/Plan:      The patient's current anticoagulation dose is Warfarin sodium 5 mg tabs: take as directed per coumadin clinic.  The target INR is 2 - 3.  The next INR is due 02/03/2010.  Anticoagulation instructions were given to patient.  Results were reviewed/authorized by Vashti Hey, RN.  He was notified by Vashti Hey RN.         Prior Anticoagulation Instructions: INR 2.2  Continue coumadin 2.5mg  once daily except 5mg  on Mondays and Fridays  Current Anticoagulation Instructions: INR 2.4 Continue coumadin 2.5mg  once daily except 5mg  on Mondays and Fridays

## 2011-01-02 NOTE — Procedures (Signed)
Summary: Holter and Event/ [facility]  Holter and Event/ [facility]   Imported By: Dorise Hiss 10/23/2010 09:10:08  _____________________________________________________________________  External Attachment:    Type:   Image     Comment:   External Document

## 2011-01-02 NOTE — Medication Information (Signed)
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 no    Recent/future dental: no  Any missed doses?: no       Is patient compliant with meds? yes       Allergies: 1)  ! Tylenol 2)  Sulfa  Anticoagulation Management History:      The patient is taking warfarin and comes in today for a routine follow up visit.  Positive risk factors for bleeding include an age of 72 years or older and presence of serious comorbidities.  The bleeding index is 'intermediate risk'.  Positive CHADS2 values include History of Diabetes.  Negative CHADS2 values include Age > 72 years old.  The start date was 12/03/1985.  Anticoagulation responsible provider: Diona Browner MD, Remi Deter.  INR POC: 2.7.  Cuvette Lot#: [id number].  Exp: 10/11.    Anticoagulation Management Assessment/Plan:      The patient's current anticoagulation dose is Warfarin sodium 5 mg tabs: take as directed per coumadin clinic.  The target INR is 2 - 3.  The next INR is due 09/05/2010.  Anticoagulation instructions were given to patient.  Results were reviewed/authorized by Vashti Hey, RN.  He was notified by Vashti Hey RN.         Prior Anticoagulation Instructions: INR 1.7 Take coumadin 7.5mg  tonight then increase dose to 2.5mg  once daily except 5mg  on Mondays, Wednesdays and Fridays  Current Anticoagulation Instructions: INR 2.7 Continue coumadin 2.5mg  once daily except 5mg  on M,W,F

## 2011-01-02 NOTE — Medication Information (Signed)
Summary: Coumadin Clinic  Anticoagulant Therapy  Managed by: Vashti Hey, RN PCP: Dr. 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 no    Recent/future dental: no  Any missed doses?: no       Is patient compliant with meds? yes       Allergies: 1)  Sulfa  Anticoagulation Management History:      The patient is taking warfarin and comes in today for a routine follow up visit.  Positive risk factors for bleeding include an age of 72 years or older and presence of serious comorbidities.  The bleeding index is 'intermediate risk'.  Positive CHADS2 values include History of Diabetes.  Negative CHADS2 values include Age > 31 years old.  The start date was 12/03/1985.  Anticoagulation responsible provider: Andee Lineman MD, Michelle Piper.  INR POC: 2.5.  Cuvette Lot#: [id number].  Exp: 10/11.    Anticoagulation Management Assessment/Plan:      The patient's current anticoagulation dose is Warfarin sodium 5 mg tabs: take as directed per coumadin clinic.  The target INR is 2 - 3.  The next INR is due 11/03/2010.  Anticoagulation instructions were given to patient.  Results were reviewed/authorized by Vashti Hey, RN.  He was notified by Vashti Hey RN.        Coagulation management information includes: S/P successful DCCV 10/23/10.  Prior Anticoagulation Instructions: INR 3.3 Take coumadin 1/2 tablet tonight then resume 1/2 tablet once daily except 1 tablet on Mondays and Fridays  Current Anticoagulation Instructions: INR 2.5  ( 1st post DCCV) Continue coumadin 2.5mg  once daily except 5mg  on Mondays and Fridays

## 2011-01-02 NOTE — Assessment & Plan Note (Signed)
Summary: dyspnea with exertion --agh   Visit Type:  Follow-up Primary Provider:  Dr. Ernestine Conrad  CC:  SOB.  History of Present Illness: telephone note/ At beach approx one month ago. Got extremely SOB with going up steps.  Had OV with PMD whe he returned.  Did labs and was told to doulble up on fluid meds.  Did feel some better.  Then went back for 2 wk check after that.  Does notice some chest tightness with exertion also.  Not feeing good last couple of months, but seems to be getting worse.  The patient is a 70-year male with history of coronary artery disease, status post inferior wall myocardial function in 1988, treated with PCI of the RCA as well as stenting to the mid LAD with bare-metal stent in 2003. The procedure was complicated by in-stent thrombosis in the setting of no Plavix therapy. There was spontaneous reperfusion at the time of catheterization. The patient's ejection fraction has improved to 72% BNP level was within normal limits The patient has moderate renal insufficiency creatinine 1.5 09 August 2010  hemoglobin 12.8 hematocrit 40.5 improvement in his creatinine from June of 2011 the patient has hypercoagulable state and is on chronic warfarin therapy. EKG demonstrates atrial fibrillation slow ventricular response and old inferior infarct pattern as well as possible old anteroseptal infarct pattern.previous EKG from 2010 shows normal sinus rhythm with left bundle branch block The patient is also on BiPAP. 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 Sees Dr Fausto Skillern. Bicycle 15 min feels SOB. Decreased exercise tolerance feels irregular heartbeat several months.  La six all at onr time.  1+ edema New on St afib. Cardiolitr 12/10 scar inferior and anteroseptal no ischemia. Ef 38%  Preventive Screening-Counseling & Management  Alcohol-Tobacco     Smoking Status: quit  Year Quit: 1988  Current Medications (verified): 1)  Carvedilol 6.25 Mg Tabs (Carvedilol) .... Take 1 Tablet By Mouth Twice A Day 2)  Simvastatin 40 Mg Tabs (Simvastatin) .... Take 1 Tablet By Mouth At Bedtime 3)  Warfarin Sodium 5 Mg Tabs (Warfarin Sodium) .... Take As Directed Per Coumadin Clinic 4)  Digoxin 0.125 Mg Tabs (Digoxin) .... Take 1 Tablet By Mouth Once A Day 5)  Furosemide 40 Mg Tabs (Furosemide) .... Take 2 Tablets By Mouth Once A Day 6)  Aspirin 81 Mg Tbec (Aspirin) .... Take 1 Tablet By Mouth Once A Day 7)  Ranitidine Hcl 150 Mg Caps (Ranitidine Hcl) .... Take 1 Tablet By Mouth Two Times A Day 8)  Gabapentin 300 Mg Caps (Gabapentin) .... Take 1 Tablet By Mouth Two Times A Day 9)  Fish Oil Double Strength 1200 Mg Caps (Omega-3 Fatty Acids) .... Take 1 Tablet By Mouth Once A Day 10)  Allopurinol 100 Mg Tabs (Allopurinol) .... Take 2 Tablet By Mouth Once A Day 11)  Suphera 10-5 % Crea (Sulfacetamide Sodium-Sulfur) .... Apply Daily To Face For Ros[place] 12)  Lorazepam 1 Mg Tabs (Lorazepam) .... Take 1 Tablet By Mouth Once A Day At Bedtime As Needed 13)  Hydrocodone-Acetaminophen 5-500 Mg Tabs (Hydrocodone-Acetaminophen) .... Take 1 Tablet By Mouth Four Times A Day As Needed 14)  Colchicine 0.6 Mg Tabs (Colchicine) .... Take 1 Tablet By Mouth Four Times A Day As Needed 15)  Glimepiride 2 Mg Tabs (Glimepiride) .... Take 1 Tablet By Mouth Two Times A Day 16)  Guaifenesin 400 Mg Tabs (Guaifenesin) .Marland KitchenMarland KitchenMarland Kitchen  One Tablet Every Six Hours As Needed  Allergies: 1)  Sulfa  Comments:  Nurse/Medical Assistant: The patient's medications were reviewed with the patient and were updated in the Medication List. Pt brought a list of medications to office visit.  Cyril Loosen, RN, BSN (October 13, 2010 10:03 AM)  Past History:  Past Medical History: Last updated: 10/11/2009 Diabetes mellitus Type 4 renal tubular acidosis Gout CAD, NATIVE VESSEL (ICD-414.01) HYPOTENSION, ORTHOSTATIC  (ICD-458.0) HYPERLIPIDEMIA-MIXED (ICD-272.4) 1. Coronary arteries with mild left ventricular dysfunction, details     above. 2. Dyslipidemia. 3. Hypertension. 4. 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 5. Gout. 6. History of cholelithiasis. 7. Type 4 renal tubular acidosis. 8. Chronic Coumadin therapy. 9. Bilateral lower extremity pain secondary to neuropathy. 10.Diabetes mellitus. 11.Recent pneumonia.     Family History: Last updated: 08/16/2009 Family History of Coronary Artery Disease:   Social History: Last updated: 08/16/2009 Married  Tobacco Use - Former.  Alcohol Use - no  Risk Factors: Smoking Status: quit (10/13/2010)  Past Cardiac History:  Cardiac Interventions: The patient is a 70-year male with history of coronary artery disease, status post inferior wall myocardial function in 1988, treated with PCI of the RCA as well as stenting to the mid LAD with bare-metal stent in 2003.  The procedure was complicated by in-stent thrombosis in the setting of no Plavix therapy.  There was spontaneous reperfusion at the time of catheterization.  The patient's ejection fraction has improved to 72%  Cardiac Catheterization, PTCA  Review of Systems       The patient complains of palpitations, shortness of breath, muscle weakness, and leg swelling.  The patient denies fatigue, malaise, fever, vision loss, decreased hearing, hoarseness, chest pain, prolonged cough, wheezing, sleep apnea, coughing up blood, abdominal pain, blood in stool, nausea, vomiting, diarrhea, heartburn, incontinence, blood in urine, joint pain, rash, skin lesions, headache, fainting, dizziness, depression, anxiety, enlarged lymph nodes, easy bruising or bleeding, and environmental allergies.    Vital Signs:  Patient profile:   70 year old male Height:      70 inches Weight:      251.25 pounds BMI:      36.18 Pulse rate:   57 / minute BP sitting:   140 / 64  (left arm) Cuff size:   large  Vitals Entered By: Cyril Loosen, RN, BSN (October 13, 2010 9:57 AM)  Nutrition Counseling: Patient's BMI is greater than 25 and therefore counseled on weight management options. CC: SOB Comments c/o shortness of breath for a while that has recently gotten worse   Physical Exam  Additional Exam:  General: Well-developed, well-nourished in no distress head: Normocephalic and atraumatic eyes PERRLA/EOMI intact, conjunctiva and lids normal nose: No deformity or lesions mouth normal dentition, normal posterior pharynx neck: Supple, no JVD.  No masses, thyromegaly or abnormal cervical nodes lungs: Normal breath sounds bilaterally without wheezing.  Normal percussion heart:a regular rate and rhythm with normal S1 and S2, no S3 or S4.  PMI is normal.  No pathological murmurs abdomen: Normal bowel sounds, abdomen is soft and nontender without masses, organomegaly or hernias noted.  No hepatosplenomegaly musculoskeletal: Back normal, normal gait muscle strength and tone normal pulsus: Pulse is normal in all 4 extremities Extremities: No peripheral pitting edema neurologic: Alert and oriented x 3 skin: Intact without lesions or rashes cervical nodes: No significant adenopathy psychologic: Normal affect    Impression &  Recommendations:  Problem # 1:  ATRIAL FIBRILLATION (ICD-427.31) the patient appears to have new-onset atrial fibrillation which is likely to begin to shortness of breath. He states fibrillation is also relatively slow. We will check a Holter monitor to make sure it is in permanent atrial fibrillation. If so we will proceed with an elective cardioversion. His updated medication list for this problem includes:    Carvedilol 6.25 Mg Tabs (Carvedilol) .[name]... Take 1 tablet by mouth twice a day    Warfarin Sodium 5 Mg Tabs (Warfarin sodium) .[name]... Take as directed per coumadin clinic     Digoxin 0.125 Mg Tabs (Digoxin) .[name]... Take 1 tablet by mouth once a day    Aspirin 81 Mg Tbec (Aspirin) .[name]... Take 1 tablet by mouth once a day  Orders: Cardionet/Event Monitor (Cardionet/Event) 2-D Echocardiogram (2D Echo)  Problem # 2:  COUMADIN THERAPY (ICD-V58.61) the patient has had hypercoagulable state that has been therapeutic and actually his INRs have been running high.the patient should be safe to proceed with cardioversion next week if he is in permanent atrial fibrillation.  Problem # 3:  PULMONARY NODULE (ICD-518.89) Assessment: Comment Only  Problem # 4:  CORONARY ARTERY DISEASE, S/P PTCA (ICD-414.9) the patient had a stress test done approximately a year ago. There was inferior and anterior scar but no ischemia. I do not think his symptoms of shortness of breath aren't angina equivalent. However we will check an echocardiogram to reassess his ejection fraction which has been anywhere between 38% and 42%. His ejection fraction is markedly declined and remained so after conversion to normal sinus rhythm for further ischemia workup may be required. His updated medication list for this problem includes:    Carvedilol 6.25 Mg Tabs (Carvedilol) .[name]... Take 1 tablet by mouth twice a day    Warfarin Sodium 5 Mg Tabs (Warfarin sodium) .[name]... Take as directed per coumadin clinic    Aspirin 81 Mg Tbec (Aspirin) .[name]... Take 1 tablet by mouth once a day  Other Orders: EKG w/ Interpretation (93000)  Patient Instructions: 1)  2D Echo  2)  48 hour holter monitor 3)  Once results available from holter - schedule Cardioversion  4)  Follow up in  6 weeks

## 2011-01-04 NOTE — Miscellaneous (Signed)
Summary: Home Care Report/ [facility]  Home Care Report/ [facility]   Imported By: Dorise Hiss 12/18/2010 16:33:38  _____________________________________________________________________  External Attachment:    Type:   Image     Comment:   External Document

## 2011-01-04 NOTE — Letter (Signed)
Summary: Lexiscan or Dobutamine Pharmacist, community at [facility]  [address]   [place], [place] [address]   Phone: [phone]  Fax: [phone]      [facility]  Lexiscan or Dobutamine Cardiolite Strss Test    [facility]  Appointment Date:_  Appointment Time:_  Your doctor has ordered a CARDIOLITE STRESS TEST using a medication to stimulate exercise so that you will not have to walk on the treadmill to determine the condition of your heart during stress. If you take blood pressure medication, ask your doctor if you should take it the day of your test. You should not have anything to eat or drink at least 4 hours before your test is scheduled, and no caffeine, including decaffeinated tea and coffee, chocolate, and soft drinks for 24 hours before your test.  You will need to register at the Outpatient/Main Entrance at the hospital 15 minutes before your appointment time. It is a good idea to bring a copy of your order with you. They will direct you to the Diagnostic Imaging (Radiology) Department.  You will be asked to undress from the waist up and given a hospital gown to wear, so dress comfortably from the waist down for example: Sweat pants, shorts, or skirt Rubber soled lace up shoes (tennis shoes)  Plan on about three hours from registration to release from the hospital

## 2011-01-04 NOTE — Progress Notes (Signed)
Summary: need O2 order   ---- Converted from flag ---- ---- 12/11/2010 10:32 AM, Vashti Hey RN wrote: Morning Gayle. Received call from Lelon Huh RN [facility] about Sena Hitch.  She has been trying to get an order for O2 for him since he came home from his cardiac arrest in [place].  States Christain Sacramento said he would fax the order but they have never received.  Have called [facility] office multiple times.  She is trying to get an order to [facility].  He is on 2L [place] continuous.  Will Dr Andee Lineman give them the order.  Don't know why she called me...last ditch effort I guess. Thanks, Misty Stanley ------------------------------  Phone Note Outgoing Call   Summary of Call: Spoke with Angelique Blonder with [facility] earlier this morning.  Did state that when nurse was out to see patient over weekend (1/1) that 02 sat  had dropped to around 83%.  Did receive initial order verbally from Christain Sacramento, NP but never received written order from him.  Advised her that we would go ahead and fax order for this initail time, but long term management would need to come from PMD.  Also, at some point will probably need to be re-evaluated for long term need anyway.  Has OV in the next couple weeks for follow up with Dr. Andee Lineman.  Denise verbalized understanding.   Initial call taken by: [name], LPN,  December 13, 2010 2:56 PM

## 2011-01-04 NOTE — Miscellaneous (Signed)
Summary: oxygen order to [facility]  Clinical Lists Changes  Medications: Added new medication of * OXYGEN may use 2 liter/minute via nasal cannula continuous - Signed Rx of OXYGEN may use 2 liter/minute via nasal cannula continuous;  #1 x 0;  Signed;  Entered by: [name], LPN;  Authorized by: Lewayne Bunting, MD, [facility];  Method used: Printed then faxed to [facility]  [address] [place], [place], [place]  [address], Ph: [phone], Fax: [phone]    Prescriptions: OXYGEN may use 2 liter/minute via nasal cannula continuous  #1 x 0   Entered by:   [name], LPN   Authorized by:   Lewayne Bunting, MD, [facility]   Signed by:   [name], LPN on 09/29/2535   Method used:   Printed then faxed to ...       [facility]  [address] [facility] (retail)       [address]       [place], [place]  [address]       Ph: [phone]       Fax: [phone]   RxID:   [id number]

## 2011-01-04 NOTE — Medication Information (Signed)
Summary: RX Folder/ [facility]  RX Folder/ [facility]   Imported By: Dorise Hiss 12/07/2010 16:41:09  _____________________________________________________________________  External Attachment:    Type:   Image     Comment:   External Document

## 2011-01-04 NOTE — Progress Notes (Signed)
Summary: holding coumadin  Phone Note Outgoing Call   Summary of Call: Notified patient to take last dose of Coumadin on Sunday, 1/22.  Pharmacy for Lovenox will be [facility].  States he has done injections before.  Advised him that you would be in contact with him on Monday.  Patient verbalized understanding.  Gayle Hill, LPN  December 22, 2010 4:33 PM   Follow-up for Phone Call        Pt did not take coumadin Sunday night as ordered so Lovenox will be started this afternoon at 100mg subcutaneously two times a day.  He will take last dose of lovenox night before procedure.  He will resume coumadin night of procedure and lovenox Saturday morning. INR on 01/02/11  Pt and wife verbalize understanding. Follow-up by: Lisa Reid RN,  December 25, 2010 2:21 PM    New/Updated Medications: ENOXAPARIN SODIUM 100 MG/ML SOLN (ENOXAPARIN SODIUM) Administer 1 syringe subcutaneously bid Prescriptions: ENOXAPARIN SODIUM 100 MG/ML SOLN (ENOXAPARIN SODIUM) Administer 1 syringe subcutaneously bid  #10 x 1   Entered by:   Lisa Reid RN   Authorized by:   Guy DeGent, MD, FACC   Signed by:   Lisa Reid RN on 12/25/2010   Method used:   Electronically to        [facility]  [address] (retail)       [address]       [place], [place]  [address]       Ph: [phone]       Fax: [phone]   RxID:   [id number]

## 2011-01-04 NOTE — Cardiovascular Report (Signed)
Summary[facility]  Card Device Clinic/ [facility]   Imported By: Dorise Hiss 12/07/2010 17:02:06  _____________________________________________________________________  External Attachment:    Type:   Image     Comment:   External Document

## 2011-01-04 NOTE — Consult Note (Signed)
Summary: Consultation Report/ [facility]  Consultation Report/ [facility]   Imported By: Dorise Hiss 12/07/2010 16:17:41  _____________________________________________________________________  External Attachment:    Type:   Image     Comment:   External Document

## 2011-01-04 NOTE — Miscellaneous (Signed)
Summary: Home Care Report/ [facility]  Home Care Report/ [facility]   Imported By: Dorise Hiss 12/12/2010 15:15:18  _____________________________________________________________________  External Attachment:    Type:   Image     Comment:   External Document

## 2011-01-04 NOTE — Progress Notes (Signed)
Summary: coumadin management  Phone Note Other Incoming   Caller: Carolin Guernsey RN [facility] Reason for Call: Discuss lab or test results Summary of Call: Called with results of INR obtained on pt today.   INR 3.2   Order given for pt to take coumadin 1.25mg  tonight then resume 2.5mg  once daily and [facility] to recheck INR on 12/27/10. Initial call taken by: Vashti Hey RN,  December 20, 2010 2:20 PM     Anticoagulant Therapy  Managed by: Vashti Hey, RN PCP: Dr. 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 no    Recent/future dental: no  Any missed doses?: no       Is patient compliant with meds? yes         Anticoagulation Management History:      His anticoagulation is being managed by telephone today.  Positive risk factors for bleeding include an age of 72 years or older and presence of serious comorbidities.  The bleeding index is 'intermediate risk'.  Positive CHADS2 values include History of CHF and History of Diabetes.  Negative CHADS2 values include Age > 19 years old.  The start date was 12/03/1985.  Anticoagulation responsible provider: Diona Browner MD, Remi Deter.  INR POC: 3.2.  Exp: 10/11.    Anticoagulation Management Assessment/Plan:      The patient's current anticoagulation dose is Warfarin sodium 5 mg tabs: take as directed per coumadin clinic.  The target INR is 2 - 3.  The next INR is due 12/27/2010.  Anticoagulation instructions were given to Carolin Guernsey RN [facility].  Results were reviewed/authorized by Vashti Hey, RN.  He was notified by Carolin Guernsey RN [facility].         Prior Anticoagulation Instructions: INR 2.8 Finished  antibiotic yesterday Continue coumadin 2.5mg  once daily   Current Anticoagulation Instructions: INR 3.2 Called with results of INR obtained on pt today.   INR 3.2   Order given for pt to take coumadin 1.25mg  tonight then resume 2.5mg  once daily and [facility] to recheck INR on 12/27/10.

## 2011-01-04 NOTE — Progress Notes (Signed)
Summary: coumadin management  Phone Note Other Incoming   Caller: Carolin Guernsey RN [facility] Reason for Call: Discuss lab or test results Summary of Call: Received call with results of INR obtained on pt today.  INR 2.8   Pt finished antibiotic yesterday.  Order given for pt to continue coumadin 2.5mg  once daily and recheck INR on 12/20/10. Initial call taken by: Vashti Hey RN,  December 14, 2010 2:23 PM     Anticoagulant Therapy  Managed by: Vashti Hey, RN PCP: Dr. 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 no    Recent/future dental: no  Any missed doses?: no       Is patient compliant with meds? yes         Anticoagulation Management History:      The patient is taking warfarin and comes in today for a routine follow up visit.  Positive risk factors for bleeding include an age of 47 years or older and presence of serious comorbidities.  The bleeding index is 'intermediate risk'.  Positive CHADS2 values include History of CHF and History of Diabetes.  Negative CHADS2 values include Age > 75 years old.  The start date was 12/03/1985.  Anticoagulation responsible provider: Diona Browner MD, Remi Deter.  INR POC: 2.8.  Exp: 10/11.    Anticoagulation Management Assessment/Plan:      The patient's current anticoagulation dose is Warfarin sodium 5 mg tabs: take as directed per coumadin clinic.  The target INR is 2 - 3.  The next INR is due 12/20/2010.  Anticoagulation instructions were given to Carolin Guernsey RN [facility].  Results were reviewed/authorized by Vashti Hey, RN.  He was notified by Carolin Guernsey RN [facility].         Prior Anticoagulation  Instructions: INR 2.9 Decrease coumadin to 2.5mg  once daily  Dr Loney Hering started pt on Azythromycin 500mg  x 3 days starting today.  Will finish 12/13/10 [facility] to recheck INR on 12/14/10  Current Anticoagulation Instructions: INR 2.8 Finished antibiotic yesterday Continue coumadin 2.5mg  once daily

## 2011-01-04 NOTE — Medication Information (Signed)
Summary: [facility]   [facility]   Imported By: Claudette Laws 12/06/2010 11:16:36  _____________________________________________________________________  External Attachment:    Type:   Image     Comment:   External Document

## 2011-01-04 NOTE — Letter (Signed)
Summary: [facility]/ [facility]  [facility] Summary/ [facility]   Imported By: Dorise Hiss 12/07/2010 16:12:30  _____________________________________________________________________  External Attachment:    Type:   Image     Comment:   External Document

## 2011-01-04 NOTE — Procedures (Signed)
Summary: [email or web address]   Current Medications (verified): 1)  Carvedilol 6.25 Mg Tabs (Carvedilol) .... Take 1 Tablet By Mouth Twice A Day 2)  Pravachol 40 Mg Tabs (Pravastatin Sodium) .... Take 1 Tab By Mouth At Bedtime 3)  Warfarin Sodium 5 Mg Tabs (Warfarin Sodium) .... Take As Directed Per [facility]madin Clinic 4)  Furosemide 40 Mg Tabs (Furosemide) .... One By Mouth Daily 5)  Ranitidine Hcl 150 Mg Caps (Ranitidine Hcl) .... Take 1 Tablet By Mouth Two Times A Day 6)  Gabapentin 300 Mg Caps (Gabapentin) .... Take 1 Tablet By Mouth Two Times A Day 7)  Fish Oil Double Strength 1200 Mg Caps (Omega-3 Fatty Acids) .... Take 1 Tablet By Mouth Once A Day 8)  Suphera 10-5 % Crea (Sulfacetamide Sodium-Sulfur) .... Apply Daily To Face For Rosesea 9)  Lorazepam 1 Mg Tabs (Lorazepam) .... Take 1 Tablet By Mouth Once A Day At Bedtime As Needed 10)  Hydrocodone-Acetaminophen 5-500 Mg Tabs (Hydrocodone-Acetaminophen) .... Take 1 Tablet By Mouth Four Times A Day As Needed 11)  Glimepiride 2 Mg Tabs (Glimepiride) .... Take 1 Tablet By Mouth Two Times A Day 12)  Guaifenesin 400 Mg Tabs (Guaifenesin) .... One Tablet Every Six Hours As Needed 13)  Ipratropium-Albuterol 0.5-2.5 (3) Mg/43ml Soln (Ipratropium-Albuterol) .... 2 Puffs Every 4 Hours As Needed  Allergies: 1)  ! * Cefuroxime 2)  Sulfa   ICD Specifications Following MD:  Hillis Range, MD     ICD Vendor:  Medtronic     ICD Model Number:  [id number]     ICD Serial Number:  [id number] ICD DOI:  11/28/2010     ICD Implanting MD:  NOT IMPLANTED BY [place]  Lead 1:    Location: RA     DOI: 11/28/2010     Model #: [id number]     Serial #: [id number]     Status: active Lead 2:    Location: RV     DOI: 11/28/2010     Model #: [id number]     Serial #: [id number]     Status: active Lead 3:    Location: LV     DOI: 11/28/2010     Model #: [id number]     Serial #: [id number]      Indications::  CM   ICD Follow Up Remote Check?  No Battery Voltage:  3.21 V     Charge Time:  8.6 seconds      Underlying rhythm:  A-FIB ICD Dependent:  No       ICD Device Measurements Atrium:  Amplitude: 0.3 mV, Impedance: 418 ohms,  Right Ventricle:  Amplitude: 7.6 mV, Impedance: 399 ohms, Threshold: 0.5 V at 0.4 msec Left Ventricle:  Impedance: 722 ohms, Threshold: 1.75 V at 0.5 msec Shock Impedance: 43/58 ohms   Episodes MS Episodes:  507     Percent Mode Switch:  16.9%     Coumadin:  Yes Shock:  0     ATP:  0     Nonsustained:  0     Atrial Pacing:  73.1%     Ventricular Pacing:  99.9%  Brady Parameters Mode DDDR     Lower Rate Limit:  60     Upper Rate Limit 130 PAV 130     Sensed AV Delay:  100  Tachy Zones VF:  207     VT1:  176     Next Cardiology Appt Due:  02/05/2011 Tech Comments:  Steri strips removed by the patient, no redness or edema.  Flutter waves today 0.26mV sensitivity programmed to the same and FFRW is apparent.  A-fib 16.9% is probably inaccurrate.  ROV 3/5 with Dr. Johney Frame in [place]. Altha Harm, LPN  December 07, 2010 5:27 PM

## 2011-01-04 NOTE — Progress Notes (Signed)
Summary: ?? if ABO okay.   Phone Note Outgoing Call   Summary of Call: Called pt to notify about change in cath plans (out pt to in pt) for hydration.  Wife states that Akon started with congestion again so they called Dr. Loney Hering.  He called in 3 day course of Azithromycin 500mg .  No fever.  Want to make sure this is okay to take before they pick up from pharm.   Initial call taken by: [name], LPN,  December 26, 2010 2:41 PM  Follow-up for Phone Call        Per Dr. Andee Lineman - okay for patient to start ABO & have pt come to office to discuss upcoming cath & recent test results.  Wife notified.  OV scheduled for Thursday, 1/26 at 11:00 a.m.   Follow-up by: [name], LPN,  December 26, 2010 4:14 PM     Appended Document: ?? if ABO okay.  noted

## 2011-01-04 NOTE — Cardiovascular Report (Signed)
Summary: Office Visit   Office Visit   Imported By: Roderic Ovens 12/12/2010 14:04:22  _____________________________________________________________________  External Attachment:    Type:   Image     Comment:   External Document

## 2011-01-04 NOTE — Progress Notes (Signed)
Summary: coumadin management  Phone Note Other Incoming   Caller: Carolin Guernsey RN [facility] Reason for Call: Discuss lab or test results Summary of Call: Called with results of INR obtained on pt today.  INR 5.2    Pt reports INR was checked on 12/04/10 and was 2.9 but do not see it documented.  Pt has been on coumadin 2.5mg  once daily except 5mg  on Mondays.  Pt has been in hospital in [place] after cardiac arrest and defib implant.  Pt has finished ceftin.  Order given for pt to hold coumadin tonight and tomorrow night, take 2.5mg  on Sunday and recheck INR on Mon 12/11/10.   Initial call taken by: Vashti Hey RN,  December 08, 2010 2:31 PM     Anticoagulant Therapy  Managed by: Vashti Hey, RN PCP: Dr. 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  Had defib implant  Any changes in medication regimen? no    Recent/future dental: no  Any missed doses?: no       Is patient compliant with meds? yes         Anticoagulation Management History:      His anticoagulation is being managed by telephone today.  Positive risk factors for bleeding include an age of 18 years or older and presence of serious comorbidities.  The bleeding index is 'intermediate risk'.  Positive CHADS2 values include History of CHF and History of Diabetes.  Negative CHADS2 values include Age > 71 years old.  The start date was 12/03/1985.  Anticoagulation responsible provider: Diona Browner MD, Remi Deter.  INR POC: 5.2.  Exp: 10/11.    Anticoagulation Management Assessment/Plan:      The patient's current anticoagulation dose is Warfarin  sodium 5 mg tabs: take as directed per coumadin clinic.  The target INR is 2 - 3.  The next INR is due 12/11/2010.  Anticoagulation instructions were given to Carrolyn Meiers.  Results were reviewed/authorized by Vashti Hey, RN.  He was notified by Carolin Guernsey RN.         Prior Anticoagulation Instructions: INR 3.3  (3rd DCCV/on Amiodarone) Continue coumadin 2.5mg  once daily except 5mg  on Mondays   Current Anticoagulation Instructions: INR 5.2 Called with results of INR obtained on pt today.  INR 5.2    Pt reports INR was checked on 12/04/10 and was 2.9 but do not see it documented.  Pt has been on coumadin 2.5mg  once daily except 5mg  on Mondays.  Pt has been in hospital in [place] after cardiac arrest and defib implant.  Pt has finished ceftin.  Order given for pt to hold coumadin tonight and tomorrow night, take 2.5mg  on Sunday and recheck INR on Mon 12/11/10.

## 2011-01-04 NOTE — Assessment & Plan Note (Signed)
Summary: rov  --agh   Visit Type:  Follow-up Primary Provider:  Dr. Ernestine Conrad   History of Present Illness: the patient is a 70 year old male with a history of ischemic cardiomyopathy prior ejection fraction 35-40%. He has a history of inferior wall myocardial infarction treated in 1988 with PCI of the RCA as well as stenting to the mid LAD with bare-metal stent in 2003. The latter procedure was complicated by in-stent thrombosis in the absence of Plavix therapy. The patient has hypercoagulable state and is on chronic Coumadin therapy. Next  Over the last several months the patient has complained of increased dyspnea. In October he was found to be back in atrial fibrillation and he was scheduled for cardioversion. Normal sinus rhythm was restored to the followup visit the patient is back in atrial fibrillation. Subsequently amiodarone was started and the patient was rescheduled for cardioversion.however her dyspnea did not improve very much. He was seen in late December by the PA who set him up for repeat cardioversion. Of note was that his last stress test was in December of 2010 which was negative for ischemia but with evidence of inferior/anterior scar and ejection fraction of 38%. The patient the next day went to the beach to visit family when he developed acute onset of shortness of breath. He was admitted to [facility] with initial presumptive diagnosis of pneumonia. While on telemetry the patient developed a bradycardic arrest and was rushed to the Cath Lab in a temporary pacemaker was inserted. He apparently required CPR. Fairly acutely also a biventricular ICD placed and a followup echocardiogram demonstrated an ejection fraction of 20-25%. Reviewing the records it's not clear what prompted a cardiac arrest whether this occurred in the setting of heart failure or whether this was a primary arrhythmogenic abnormality. No mention was made in the discharge summary the patient ruled out for  myocardial infarction. Also no diagnostic catheterization was performed either before or after the ICD implant. on discharge from [facility] the patient was still short of breath and apparently was hypoxemic. He was placed on oxygen therapy. When [facility] home health came out in January 1 his oxygen saturation was 86% and oxygen was continued however in the office today I discontinued his oxygen saturation was 95%. I also had the patient perform an abbreviated 6 minute walk any saturation actually increased to 97%. The patient still reports shortness of breath on exertion with occasional chest pressure. Aspirin was not restarted upon discharge and I asked him to reinitiate this.his PT/INR yesterday done by home health was 1.3. He is currently not on amiodarone anymore although his electrocardiogram demonstrates underlying atrial fibrillation with ventricular pacing at 64 beats per minute but with a relatively wide QRS complex and unclear if there is good left ventricular lead capture. Of note again was that the patient ejection fraction in November 2011 was 35-40% and a most recent ejection fraction was obtained immediately after cardiac arrest.  Preventive Screening-Counseling & Management  Alcohol-Tobacco     Smoking Status: quit     Year Quit: 1988  Current Medications (verified): 1)  Carvedilol 6.25 Mg Tabs (Carvedilol) .... Take 1 Tablet By Mouth Twice A Day 2)  Pravachol 40 Mg Tabs (Pravastatin Sodium) .... Take 1 Tab By Mouth At Bedtime 3)  Warfarin Sodium 5 Mg Tabs (Warfarin Sodium) .... Take As Directed Per Coumadin Clinic 4)  Furosemide 40 Mg Tabs (Furosemide) .... Take 1 Tablet By Mouth Once A Day Alternating With 2 Daily 5)  Ranitidine Hcl 150 Mg Caps (Ranitidine Hcl) .... Take 1 Tablet By Mouth Two Times A Day 6)  Gabapentin 300 Mg Caps (Gabapentin) .... Take 1 Tablet By Mouth Two Times A Day 7)  Fish Oil Double Strength 1200 Mg Caps (Omega-3 Fatty Acids) .... Take 1 Tablet By  Mouth Once A Day 8)  Suphera 10-5 % Crea (Sulfacetamide Sodium-Sulfur) .... Apply Daily To Face For Rosesea 9)  Lorazepam 1 Mg Tabs (Lorazepam) .... Take 1 Tablet By Mouth Once A Day At Bedtime As Needed 10)  Percocet 5-325 Mg Tabs (Oxycodone-Acetaminophen) .... Take One By Mouth Every 4 Hours As Needed 11)  Glimepiride 2 Mg Tabs (Glimepiride) .... Take 1 Tablet By Mouth Two Times A Day 12)  Mucinex 600 Mg Xr12h-Tab (Guaifenesin) .... Take 1 Tablet By Mouth Two Times A Day As Needed 13)  Ipratropium-Albuterol 0.5-2.5 (3) Mg/46ml Soln (Ipratropium-Albuterol) .... 2 Puffs Every 4 Hours As Needed 14)  Diphenhydramine Hcl 25 Mg Tabs (Diphenhydramine Hcl) .... As Needed 15)  Aspirin 81 Mg Tbec (Aspirin) .... Take 1 Tablet By Mouth Once A Day  Allergies (verified): 1)  ! * Cefuroxime 2)  Sulfa  Comments:  Nurse/Medical Assistant: The patient's medication list and allergies were reviewed with the patient and were updated in the Medication and Allergy Lists.  Past History:  Family History: Last updated: 08/16/2009 Family History of Coronary Artery Disease:   Social History: Last updated: 08/16/2009 Married  Tobacco Use - Former.  Alcohol Use - no  Risk Factors: Smoking Status: quit (12/21/2010)  Past Medical History: Diabetes mellitus Type 4 renal tubular acidosis Gout CAD, NATIVE VESSEL (ICD-414.01) HYPOTENSION, ORTHOSTATIC (ICD-458.0) HYPERLIPIDEMIA-MIXED (ICD-272.4) 1. Coronary arteries with mild left ventricular dysfunction,.2010 history of inferior wall myocardial infarction 1988 status post PTCI of the RCA and stent placement to the mid LAD with bare-metal stent in 2003 complicated by iin-stent thrombosis in the absence of Plavix therapy 2. Dyslipidemia. 3. Hypertension. 4. 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 5. Gout. 6. History of cholelithiasis. 7. Type 4 renal  tubular acidosis. 8. Chronic Coumadin therapy. 9. Bilateral lower extremity pain secondary to neuropathy. 10.Diabetes mellitus. 11.Recent pneumonia.  Chronic systolic heart failure Ischemic cardiomyopathy... EF 35-40%, by 2-D echo, 11/11... non-ischemic CL; inferior/inferoseptal scar, 12/10 Status post cardiac arrest questionable complete heart block versus asystole at [facility] status post CRT implantation [no ischemia evaluation performed at that time] Pneumonia December 2012 LV dysfunction December 2012 ejection fraction 20-25% post cardiac arrest and CPR    Past Cardiac History:  Cardiac Interventions: The patient is a 67-year male with history of coronary artery disease, status post inferior wall myocardial function in 1988, treated with PCI of the RCA as well as stenting to the mid LAD with bare-metal stent in 2003.  The procedure was complicated by in-stent thrombosis in the setting of no Plavix therapy.  There was spontaneous reperfusion at the time of catheterization.  The patient's ejection fraction has improved to 42%.  Cardiac Catheterization, PTCA  Review of Systems       The patient complains of shortness of breath, sleep apnea, muscle weakness, joint pain, and dizziness.  The patient denies fatigue, malaise, fever, weight gain/loss, vision loss, decreased hearing, hoarseness, chest pain, palpitations, prolonged cough, wheezing, coughing up blood, abdominal pain, blood in stool, nausea, vomiting, diarrhea, heartburn, incontinence, blood in urine, leg swelling, rash, skin lesions, headache, fainting, depression, anxiety,  enlarged lymph nodes, easy bruising or bleeding, and environmental allergies.    Vital Signs:  Patient profile:   70 year old male Height:      70 inches Weight:      226 pounds O2 Sat:      98 % on 2 L/min via [place] Pulse rate:   64 / minute BP sitting:   108 / 72  (left arm) Cuff size:   large  Vitals Entered By: Carlye Grippe (December 21, 2010 9:58 AM)  O2 Flow:  2 L/min via [place]  Physical Exam  Additional Exam:  General: no apparent distress on oxygen therapy head: Normocephalic and atraumatic eyes PERRLA/EOMI intact, conjunctiva and lids normal nose: No deformity or lesions mouth normal dentition, normal posterior pharynx neck: Supple, no JVD.  No masses, thyromegaly or abnormal cervical nodes Chest: ICD in place in the left subclavicular area. lungs: Normal breath sounds bilaterally without wheezing.  Normal percussion heart: regular rate and rhythm with normal S1 and S2, no S3 or S4.  PMI is normal.  No pathological murmurs abdomen: Normal bowel sounds, abdomen is soft and nontender without masses, organomegaly or hernias noted.  No hepatosplenomegaly musculoskeletal: Back normal, normal gait muscle strength and tone normal pulsus: Pulse is normal in all 4 extremities Extremities: No peripheral pitting edema neurologic: Alert and oriented x 3 skin: Intact without lesions or rashes cervical nodes: No significant adenopathy psychologic: Normal affect    EKG  Procedure date:  12/21/2010  Findings:      atrial fibrillation ventricular pacing heart rate 64 beats per minute. Relative wide QRS 180 ms.   ICD Specifications Following MD:  Hillis Range, MD     ICD Vendor:  Medtr[employer]     ICD Model Number:  [id number]     ICD Serial Number:  [id number] ICD DOI:  11/28/2010     ICD Implanting MD:  NOT IMPLANTED BY [place]  Lead 1:    Location: RA     DOI: 11/28/2010     Model #: [id number]     Serial #: [id number]     Status: active Lead 2:    Location: RV     DOI: 11/28/2010     Model #: 1027     Serial #: [id number]     Status: active Lead 3:    Location: LV     DOI: 11/28/2010     Model #: [id number]     Serial #: [id number]      Indications::  CM   ICD Follow Up ICD Dependent:  No      Episodes Coumadin:  Yes  Brady Parameters Mode DDDR     Lower Rate Limit:  60     Upper Rate Limit 130 PAV 130     Sensed AV Delay:   100  Tachy Zones VF:  207     VT1:  176     Impression & Recommendations:  Problem # 1:  CHRONIC SYSTOLIC HEART FAILURE (ICD-428.22) the patient post cardiac arrest developed worsening LV dysfunction although suspect that his ejection fraction has improved. I am not entirely certain his LV lead is capturing properly and we will have his Medtronic device interrogated. I will also order chest x-ray PA and lateral confirmed lead placement an echocardiogram to reassess his LV function. The patient's presentation around Christmas in [place] was rather acute sudden onset shortness of breath and I do feel further ischemia evaluation is very important. I scheduled the patient for left heart  catheterization to be done from the arm with Dr. Clarise Cruz. The patient has a hypercoagulable state and will need bridging with Lovenox. His updated medication list for this problem includes:    Carvedilol 6.25 Mg Tabs (Carvedilol) .[name]... Take 1 tablet by mouth twice a day    Warfarin Sodium 5 Mg Tabs (Warfarin sodium) .[name]... Take as directed per coumadin clinic    Furosemide 40 Mg Tabs (Furosemide) .[name]... Take 1 tablet by mouth once a day alternating with 2 daily    Aspirin 81 Mg Tbec (Aspirin) .[name]... Take 1 tablet by mouth once a day  Problem # 2:  ATRIAL FIBRILLATION (ICD-427.31) patient remains in apparent atrial fibrillation. He will need to continue long-term on warfarin but will be temporal discontinued at the time of his catheterization His updated medication list for this problem includes:    Carvedilol 6.25 Mg Tabs (Carvedilol) .[name]... Take 1 tablet by mouth twice a day    Warfarin Sodium 5 Mg Tabs (Warfarin sodium) .[name]... Take as directed per coumadin clinic    Aspirin 81 Mg Tbec (Aspirin) .[name]... 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 Will schedule for a diagnostic heart catheterization. I discussed risks and benefits of the procedure with the patient and his wife and is willing to proceed His updated medication list for this problem includes:    Carvedilol 6.25 Mg Tabs (Carvedilol) .[name]... Take 1 tablet by mouth twice a day    Warfarin Sodium 5 Mg Tabs (Warfarin sodium) .[name]... Take as directed per coumadin clinic    Aspirin 81 Mg Tbec (Aspirin) .[name]... Take 1 tablet by mouth once a day  Other Orders: T-Chest x-ray, 2 views (76160) 2-D Echocardiogram (2D Echo) Cardiac Catheterization (Cardiac Cath)  Patient Instructions: 1)  Left JV Cath next week - radial case 2)  Lovenox bridging will be needed  3)  Begin Aspirin 81mg  daily 4)  Chest x-ray  5)  2D Echo 6)  Stop Oxygen  7)  Follow up in  1 month

## 2011-01-04 NOTE — Letter (Signed)
Summary: Cardiac Cath Instructions - [facility]  [facility] [address]   [place], [place] [address]   Phone: [phone]  Fax: [phone]     12/21/2010 MRN: [id number]  [name] [address] [place], [place]  [address]  Dear [name],   You are scheduled for a Cardiac Catheterization on Friday, January 27 at 8:30 a.m. with Dr. Arvilla Meres.  Please arrive to the 1st floor of the Heart and Vascular Center at [facility] at 7:30 am on the day of your procedure. Please do not arrive before 6:30 a.m. Call the Heart and Vascular Center at [phone] if you are unable to make your appointmnet. The Code to get into the parking garage under the building is 0030. Take the elevators to the 1st floor. You must have someone to drive you home. Someone must be with you for the first 24 hours after you arrive home. Please wear clothes that are easy to get on and off and wear slip-on shoes. Do not eat or drink after midnight except water with your medications that morning. Bring all your medications and current insurance cards with you.  _X__ DO NOT take these medications before your procedure:  Warfarin (follow bridging instructions given by Misty Stanley), Furosemide - morning of procedure, Glimepiride - morning of procedure.  __X_ Make sure you take your aspirin.  ___ You may take ALL of your medications with water that morning.  ___ DO NOT take ANY medications before your procedure.  ___ Pre-med instructions:  ________________________________________________________________________  The usual length of stay after your procedure is 2 to 3 hours. This can vary.  If you have any questions, please call the office at the number listed above.  [name], LPN               Directions to the [facility]  Please Note : Park in [place] under the building not the parking deck.  From [employer]: Turn onto  [address] Left onto [address] (1st stoplight) Right at the brick entrance to the hospital ([address] circle [address]) Bear to the right and you will see a blue sign "Heart and Vascular Center" Parking garage is a sharp right'to get through the gate out in the code _______. Once you park, take the elevator to the first floor. Please do not arrive before 0630am. The building will be dark before that time.   From [address] Turn onto [address] Turn left into the brick entrance to the hospital (Main circle drive) [place] to the right and you will see a blue sign "[facility] Center" Parking garage is a sharp right, to get thru the gate put in the code ____. Once you park, take the elevator to the first floor. Please do not arrive before 0630am. The building will be dark before that time

## 2011-01-04 NOTE — Medication Information (Signed)
Summary: ccr-lr  Anticoagulant Therapy  Managed by: Vashti Hey, RN PCP: Dr. Ernestine Conrad Supervising MD: Andee Lineman MD, Michelle Piper Indication 1: Pulmonary Embolism and Infarction (ICD-415.1) Indication 2: embolism & thrombosis of unspecified site (mesente Lab Used: [facility] Site: [place] INR POC 3.3  Dietary changes: no    Health status changes: no    Bleeding/hemorrhagic complications: no    Recent/future hospitalizations: no    Any changes in medication regimen? no    Recent/future dental: no  Any missed doses?: no       Is patient compliant with meds? yes       Allergies: 1)  Sulfa  Anticoagulation Management History:      The patient is taking warfarin and comes in today for a routine follow up visit.  Positive risk factors for bleeding include an age of 72 years or older and presence of serious comorbidities.  The bleeding index is 'intermediate risk'.  Positive CHADS2 values include History of Diabetes.  Negative CHADS2 values include Age > 72 years old.  The start date was 12/03/1985.  Anticoagulation responsible provider: Andee Lineman MD, Michelle Piper.  INR POC: 3.3.  Cuvette Lot#: [id number].  Exp: 10/11.    Anticoagulation Management Assessment/Plan:      The patient's current anticoagulation dose is Warfarin sodium 5 mg tabs: take as directed per coumadin clinic.  The target INR is 2 - 3.  The next INR is due 11/24/2010.  Anticoagulation instructions were given to patient.  Results were reviewed/authorized by Vashti Hey, RN.  He was notified by Vashti Hey RN.         Prior Anticoagulation Instructions: INR 2.7  (2nd DCCV/on amioadrone) Continue coumadin 2.5mg  once daily except 5mg  on Mondays  Current Anticoagulation Instructions: INR 3.3  (3rd DCCV/on Amiodarone) Continue coumadin 2.5mg  once daily except 5mg  on Mondays

## 2011-01-04 NOTE — Consult Note (Signed)
Summary: [facility]  [facility]   Imported By: Claudette Laws 12/06/2010 11:18:09  _____________________________________________________________________  External Attachment:    Type:   Image     Comment:   External Document

## 2011-01-04 NOTE — Miscellaneous (Signed)
Summary: Home Care Report/ [facility]  Home Care Report/ [facility]   Imported By: Dorise Hiss 12/22/2010 11:57:36  _____________________________________________________________________  External Attachment:    Type:   Image     Comment:   External Document

## 2011-01-04 NOTE — Progress Notes (Signed)
[place] - p[place]/defib put [place]   Phone Note Other Incoming   Caller: wife - Johnny Bridge Summary of Call: Had episode over holdiay while at the beach.  Had pacer/defib put in.  Wants to f/u here in office to discuss med and current issues.  Also, c/o of having alot of congestion.  Advised wife that Dr. Andee Lineman is out of office till 1/5.  If having current issue with congestion, should probably call to be seen by PMD ([place]).  Also, asked pt to sign release so that we could obtain records from this recent hospitalization.  Has wound check for device scheduled for [facility]. office on 1/5 at 4:00 and Dr. Johney Frame for new device evaluation for 3/5 at 3:30.  These visits were scheduled per hospital d/c.  Nothing scheduled for primary cardiologist.  When do you want to see him and where at on your schedule as you are pretty over booked?  Also cancelled his DCCV scheduled for 1/5 at 8:30.   Initial call taken by: [name], LPN,  December 05, 2010 2:19 PM  Follow-up for Phone Call        I hope they did cardiac catheterization before putting in a defibrillator.  His ejection fraction was greater than 35% he recently.  He did have shortness of breath and chest pain and we were contemplating a cardiac catheterization.Gene saw him last. Need to review the records first in particularly in the deceiver cardiac catheterization was done probable need to see him first available Follow-up by: Lewayne Bunting, MD, [facility],  December 05, 2010 2:32 PM  Additional Follow-up for Phone Call Additional follow up Details #1::        OV scheduled for 1/19 at 9:45 with GD.   Additional Follow-up by: [name], LPN,  December 05, 2010 4:21 PM     Appended Document: episode at beach - pacer/defib put in  Medical records received from [facility] in [place], [place] today.  All notes scanned in for your review.    Appended Document: episode at beach - pacer/defib put in  reviewed

## 2011-01-04 NOTE — Letter (Signed)
Summary: Cardioversion/TEE Cardioversion Letter  All     ,     Phone:   Fax:     11/24/2010 MRN: [id number]  [facility] [address] [place], [place]  [address]    You are scheduled for a Cardioversion / TEE Cardioversion on _THURSDAY, JANUARY 5, 2012___________________________ with Dr. ___________DE GENT______________________________.   Please arrive at [facility] at _______0630______ a.m. on the day of your procedure.  1)   DIET:  A)   Nothing to eat or drink after midnight except your medications with a sip of water.  B)   May have clear liquid breakfast, then nothing to eat or drink after _________ a.m. / p.m.      Clear liquids include:  water, broth, Sprite, Ginger Ale, black coffee, tea (no sugar),      cranberry / grape / apple juice, jello (not red), popsicle from clear juices (not red).  2)   [facility] on [facility]______ at ____________ a.m./p.m. for your pre-procedure visit.  3)   MAKE SURE YOU TAKE YOUR COUMADIN.  4)   A)   DO NOT TAKE these medications before your procedure:      __HOLD COREG 48 HOURS BEFORE CARDIOVERSION. HOLD DIGOXIN AM OF CARDIOVERSION. _________________________________________________________________     ___________________________________________________________________     ___________________________________________________________________  B)   YOU MAY TAKE ALL of your remaining medications with a small amount of water.    C)   START NEW medications:       ___________________________________________________________________     ___________________________________________________________________  5)   Must have a responsible person to drive you home.  6)   Bring a current list of your medications and current insurance cards.   * Special Note:  Every effort is made to have your procedure done on time. Occasionally there are emergencies that present themselves at the hospital that may cause delays. Please be  patient if a delay does occur.  * If you have any questions after you get home, please call the office at [phone].

## 2011-01-04 NOTE — Cardiovascular Report (Signed)
Summary: [facility]ter[facility]  [facility]   Imported By: Dorise Hiss 12/07/2010 16:20:00  _____________________________________________________________________  External Attachment:    Type:   Image     Comment:   External Document

## 2011-01-04 NOTE — Progress Notes (Signed)
Summary: Coumadin management  Phone Note Other Incoming   Caller: Pat w/ Home Health Summary of Call: INR 2.9 / please call with directions / tg Initial call taken by: Raechel Ache [facility],  December 11, 2010 3:00 PM     Anticoagulant Therapy  Managed by: Vashti Hey, RN PCP: Dr. 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 yes       Details: Azythromycin 500mg  x 3 days starting today   Recent/future dental: no  Any missed doses?: no       Is patient compliant with meds? yes         Anticoagulation Management History:      His anticoagulation is being managed by telephone today.  Positive risk factors for bleeding include an age of 72 years or older and presence of serious comorbidities.  The bleeding index is 'intermediate risk'.  Positive CHADS2 values include History of CHF and History of Diabetes.  Negative CHADS2 values include Age > 72 years old.  The start date was 12/03/1985.  Anticoagulation responsible provider: Dietrich Pates MD, Molly Maduro.  INR POC: 2.9.  Exp: 10/11.    Anticoagulation Management Assessment/Plan:      The patient's current anticoagulation dose is Warfarin sodium 5 mg tabs: take as directed per coumadin clinic.  The target INR is 2 - 3.  The next INR is due 12/14/2010.  Anticoagulation instructions were given to wife.  Results were reviewed/authorized by Vashti Hey, RN.  He was notified by Vashti Hey RN.         Prior Anticoagulation Instructions: INR 5.2 Called with results of INR obtained on pt today.  INR 5.2    Pt reports INR was checked on 12/04/10 and was 2.9 but do not see it documented.  Pt has  been on coumadin 2.5mg  once daily except 5mg  on Mondays.  Pt has been in hospital in [place] after cardiac arrest and defib implant.  Pt has finished ceftin.  Order given for pt to hold coumadin tonight and tomorrow night, take 2.5mg  on Sunday and recheck INR on Mon 12/11/10.    Current Anticoagulation Instructions: INR 2.9 Decrease coumadin to 2.5mg  once daily  Dr Loney Hering started pt on Azythromycin 500mg  x 3 days starting today.  Will finish 12/13/10 [facility] to recheck INR on 12/14/10

## 2011-01-04 NOTE — Miscellaneous (Signed)
Summary: Home Care Report/ [facility]  Home Care Report/ [facility]   Imported By: Dorise Hiss 12/08/2010 09:27:46  _____________________________________________________________________  External Attachment:    Type:   Image     Comment:   External Document

## 2011-01-04 NOTE — Assessment & Plan Note (Signed)
Summary: f6w/needs INR check  --agh   Visit Type:  Follow-up Primary Chandni Gagan:  Dr. Ernestine Conrad   History of Present Illness: patient presents for post cardioversion followup.  Although NSR was initially restored, by Dr. Andee Lineman, following one application of 100 J, on November 21st, he was found to be back in atrial fibrillation, when he returned to the clinic for post procedure EKG, 4 days later. At that time, Dr. Andee Lineman initiated amiodarone treatment at 400 mg daily (x1 month Preventive Screening-Counseling & Management  Alcohol-Tobacco     Smoking Status: quit  Comments: Quit smoking 1988  Current Medications (verified): 1)  Carvedilol 6.25 Mg Tabs (Carvedilol) .... Take 1 Tablet By Mouth Twice A Day 2)  Pravachol 40 Mg Tabs (Pravastatin Sodium) .... Take 1 Tab By Mouth At Bedtime 3)  Warfarin Sodium 5 Mg Tabs (Warfarin Sodium) .... Take As Directed Per Coumadin Clinic 4)  Digoxin 0.125 Mg Tabs (Digoxin) .... Take 1 Tablet By Mouth Once A Day 5)  Furosemide 40 Mg Tabs (Furosemide) .... Take 2 Tablets By Mouth Once A Day 6)  Aspirin 81 Mg Tbec (Aspirin) .... Take 1 Tablet By Mouth Once A Day 7)  Ranitidine Hcl 150 Mg Caps (Ranitidine Hcl) .... Take 1 Tablet By Mouth Two Times A Day 8)  Gabapentin 300 Mg Caps (Gabapentin) .... Take 1 Tablet By Mouth Two Times A Day 9)  Fish Oil Double Strength 1200 Mg Caps (Omega-3 Fatty Acids) .... Take 1 Tablet By Mouth Once A Day 10)  Allopurinol 100 Mg Tabs (Allopurinol) .... Take 2 Tablet By Mouth Once A Day 11)  Suphera 10-5 % Crea (Sulfacetamide Sodium-Sulfur) .... Apply Daily To Face For Ros[place] 12)  Lorazepam 1 Mg Tabs (Lorazepam) .... Take 1 Tablet By Mouth Once A Day At Bedtime As Needed 13)  Hydrocodone-Acetaminophen 5-500 Mg Tabs (Hydrocodone-Acetaminophen) .... Take 1 Tablet By Mouth Four Times A Day As Needed 14)  Colchicine 0.6 Mg Tabs (Colchicine) .... Take 1 Tablet By Mouth Four Times A Day As Needed 15)  Glimepiride 2 Mg Tabs  (Glimepiride) .... Take 1 Tablet By Mouth Two Times A Day 16)  Guaifenesin 400 Mg Tabs (Guaifenesin) .... One Tablet Every Six Hours As Needed 17)  Amiodarone Hcl 400 Mg Tabs (Amiodarone Hcl) .... Take 1 Tablet By Mouth Once A Day X One Month 18)  Amiodarone Hcl 200 Mg Tabs (Amiodarone Hcl) .... Take 1 Tablet By Mouth Once A Day  Allergies: 1)  Sulfa  Comments:  Nurse/Medical Assistant: The patient's medications and allergies were reviewed with the patient and were updated in the Medication and Allergy Lists. Pt verbally confirmed medications.  Cyril Loosen, RN, BSN (November 24, 2010 11:08 AM)  Past History:  Past Medical History: Diabetes mellitus Type 4 renal tubular acidosis Gout CAD, NATIVE VESSEL (ICD-414.01) HYPOTENSION, ORTHOSTATIC (ICD-458.0) HYPERLIPIDEMIA-MIXED (ICD-272.4) 1. Coronary arteries with mild left ventricular dysfunction, details     above. 2. Dyslipidemia. 3. Hypertension. 4. 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 5. Gout. 6. History of cholelithiasis. 7. Type 4 renal tubular acidosis. 8. Chronic Coumadin therapy. 9. Bilateral lower extremity pain secondary to neuropathy. 10.Diabetes mellitus. 11.Recent pneumonia.  Chronic systolic heart failure Ischemic cardiomyopathy... EF 35-40%, by 2-D echo, 11/11... non-ischemic CL; inferior/inferoseptal scar, 12/10    Past Cardiac History:  Cardiac Interventions: The patient is a 70-year male with history of coronary artery disease, status post inferior  wall myocardial function in 1988, treated with PCI of the RCA as well as stenting to the mid LAD with bare-metal stent in 2003.  The procedure was complicated by in-stent thrombosis in the setting of no Plavix therapy.  There was spontaneous reperfusion at the time of catheterization.  The patient's ejection fraction has improved to 42%.  Cardiac  Catheterization, PTCA  Review of Systems       No fevers, chills, hemoptysis, dysphagia, melena, hematocheezia, hematuria, rash, claudication, orthopnea, pnd, pedal edema. All other systems negative.   Vital Signs:  Patient profile:   70 year old male Height:      70 inches Weight:      245.75 pounds Pulse rate:   61 / minute BP sitting:   147 / 70  (left arm) Cuff size:   large  Vitals Entered By: Cyril Loosen, RN, BSN (November 24, 2010 11:02 AM) Comments Follow up viist   Physical Exam  Additional Exam:  GEN: 70 year old male, obese, no distress HEENT: NCAT,PERRLA,EOMI NECK: palpable pulses, no bruits; no JVD; no TM LUNGS: CTA bilaterally HEART: irregular irregular (S1S2); no significant murmurs; no rubs; no gallops ABD: soft, NT; intact BS EXT: intact distal pulses; no significant edema SKIN: warm, dry MUSC: no obvious deformity NEURO: A/O (x3)     EKG  Procedure date:  11/24/2010  Findings:      atrial fibrillation at 61 bpm; chronic LBBB  Impression & Recommendations:  Problem # 1:  ATRIAL FIBRILLATION (ICD-427.31)  Patient remains in A. fib with SVR. Therefore, we will arrange for a second attempt at restoring NSR, with DC cardioversion next week, with Dr. Andee Lineman. Digoxin and carvedilol will be placed on hold (as before). Labs will be drawn on a.m. of procedure. Amiodarone to be continued at 200 mg daily, as previously outlined. Post cardioversion followup will be arranged, per Dr. Andee Lineman, following the procedure.  Problem # 2:  COUMADIN THERAPY (ICD-V58.61)  Patient was initially treated with Coumadin anticoagulation, for treatment of hypercoagulable state. In the event he will need cardiac catheterization in the near future, he'll require bridging overlap with heparin.  Problem # 3:  CAD, NATIVE VESSEL (ICD-414.01)  patient presents with a history of chronic stable angina. His last stress test, 12/10, was negative for definite ischemia, but with  evidence of inferior/anterior scar; EF 38%. Patient may require a restudy in near future, if he remains symptomatic and reports accelerating anginal symptoms.  Problem # 4:  CHRONIC SYSTOLIC HEART FAILURE (ICD-428.22)  patient appears euvolemic by history and presentation. He has diuresis approximately 5 pounds, since last visit.  Other Orders: EKG w/ Interpretation (93000) T-Basic Metabolic Panel (04540-98119) T-Protime, Auto ([id number]) Cardioversion (Cardioversion) T-BNP  (B Natriuretic Peptide) (30865-78469)  Patient Instructions: 1)  Your physician has recommended that you have a cardioversion (DCCV).  Electrical cardioversion uses a jolt of electricity to your heart either through paddles or wired patches attached to your chest. This is a controlled, usually prescheduled, procedure. Defibrillation is done under light anesthesia in the hospital, and you usually go home the day of the procedure. This is done to get your heart back into a normal rhythm. You are not awake for the procedure. Please see the instruction sheet given to you today.

## 2011-01-04 NOTE — Miscellaneous (Signed)
Summary: Home Care Report/ [facility]  Home Care Report/ [facility]   Imported By: Dorise Hiss 12/18/2010 16:37:36  _____________________________________________________________________  External Attachment:    Type:   Image     Comment:   External Document

## 2011-01-04 NOTE — Miscellaneous (Signed)
Summary: Home Care Report/ [facility]  Home Care Report/ [facility]   Imported By: Dorise Hiss 12/12/2010 15:14:00  _____________________________________________________________________  External Attachment:    Type:   Image     Comment:   External Document

## 2011-01-04 NOTE — Letter (Signed)
Summary: [facility]  [facility]   Imported By: Claudette Laws 12/06/2010 11:17:37  _____________________________________________________________________  External Attachment:    Type:   Image     Comment:   External Document

## 2011-01-04 NOTE — Letter (Signed)
Summary: Letter/  [facility]  Letter/  [facility]   Imported By: Dorise Hiss 12/13/2010 11:09:27  _____________________________________________________________________  External Attachment:    Type:   Image     Comment:   External Document

## 2011-01-10 ENCOUNTER — Encounter: Payer: Self-pay | Admitting: Cardiology

## 2011-01-10 ENCOUNTER — Ambulatory Visit (INDEPENDENT_AMBULATORY_CARE_PROVIDER_SITE_OTHER): Payer: Medicare Other | Admitting: Cardiology

## 2011-01-10 DIAGNOSIS — R0602 Shortness of breath: Secondary | ICD-10-CM

## 2011-01-10 DIAGNOSIS — I5022 Chronic systolic (congestive) heart failure: Secondary | ICD-10-CM

## 2011-01-10 LAB — CONVERTED CEMR LAB: POC INR: 1.5

## 2011-01-10 NOTE — Miscellaneous (Signed)
Summary: Home Care Report/ [facility]  Home Care Report/ [facility]   Imported By: Dorise Hiss 01/04/2011 10:46:14  _____________________________________________________________________  External Attachment:    Type:   Image     Comment:   External Document

## 2011-01-10 NOTE — Medication Information (Signed)
Summary: ccr-lr  Anticoagulant Therapy  Managed by: Vashti Hey, RN PCP: Dr. 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 yes       Details: see coumadin note  Recent/future dental: no  Any missed doses?: no       Is patient compliant with meds? yes       Allergies: 1)  ! * Cefuroxime 2)  Sulfa  Anticoagulation Management History:      His anticoagulation is being managed by telephone today.  Positive risk factors for bleeding include an age of 72 years or older and presence of serious comorbidities.  The bleeding index is 'intermediate risk'.  Positive CHADS2 values include History of CHF and History of Diabetes.  Negative CHADS2 values include Age > 55 years old.  The start date was 12/03/1985.  Anticoagulation responsible provider: Andee Lineman MD, Michelle Piper.  INR POC: 3.0.  Exp: 10/11.    Anticoagulation Management Assessment/Plan:      The patient's current anticoagulation dose is Warfarin sodium 5 mg tabs: take as directed per coumadin clinic.  The target INR is 2 - 3.  The next INR is due 01/08/2011.  Anticoagulation instructions were given to [facility].  Results were reviewed/authorized by Vashti Hey, RN.  He was notified by Fredia Beets RN [facility].         Prior Anticoagulation Instructions: INR 3.2 Called with results of INR obtained on pt today.   INR 3.2   Order given for pt to take coumadin 1.25mg  tonight then resume 2.5mg  once daily and [facility] to recheck INR on 12/27/10.  Current Anticoagulation Instructions: INR 3.0 INR on 1/28 was 1.5 per pt.  Took  coumadin 10mg  Sat night and 5mg  on Sun/Mon night Took last dose of Lovenox Sunday night Resume coumadin 2.5mg  once daily except 5mg  on Mondays  [facility] to recheck INR on 01/08/11

## 2011-01-10 NOTE — Miscellaneous (Signed)
Summary: Home Care Report/ [facility]  Home Care Report/ [facility]   Imported By: Dorise Hiss 01/01/2011 14:38:54  _____________________________________________________________________  External Attachment:    Type:   Image     Comment:   External Document

## 2011-01-10 NOTE — Miscellaneous (Signed)
Summary: Home Care Report/ [facility]  Home Care Report/ [facility]   Imported By: Dorise Hiss 01/04/2011 10:44:50  _____________________________________________________________________  External Attachment:    Type:   Image     Comment:   External Document

## 2011-01-17 ENCOUNTER — Telehealth: Payer: Self-pay | Admitting: Cardiology

## 2011-01-17 ENCOUNTER — Encounter: Payer: Self-pay | Admitting: Cardiology

## 2011-01-17 LAB — CONVERTED CEMR LAB: POC INR: 2.8

## 2011-01-18 ENCOUNTER — Encounter: Payer: Self-pay | Admitting: Cardiology

## 2011-01-18 NOTE — Assessment & Plan Note (Signed)
Summary: discuss test results & pending cath --agh   Visit Type:  Follow-up Primary Provider:  Dr. Ernestine Conrad   History of Present Illness: the patient is a 70 year old male with a history of an ischemic cardiomyopathy prior ejection fraction of 30-40%. He has a history of inferior wall myocardial infarction treated in 1988 with PCI of the RCA as well as stenting to the mid LAD with bare-metal stent in 2003. The latter procedure was complicated by in-stent or bolus in the absence of Plavix therapy. The patient also has a hypercoagulable state is on chronic Coumadin therapy.  More recently the patient was admitted to an outside hospital at [facility] with shortness of breath and was found to be hypoxemic. Apparently at that time the patient suffered a cardiac arrest and ICD was implanted. This appeared to be bradycardic arrest and the patient was rested the Cath Lab where temporary pacemaker was inserted. He briefly required CPR. A biventricular ICD was placed and an echocardiogram demonstrates an ejection fraction of 20-25%  Note is that prior to this event in October the patient had been cardioverted for atrial fibrillation and was restored to normal sinus rhythm. His ejection fraction was 38% at that time and a stress test in December of 2010 was negative for ischemia. The study at that time was consistent with a significant scar inferior and inferolateral as well as antral apical.  The patient did not undergo cardiac catheterization after his cardiac arrest. We initially planned to proceed with cardiac catheterization but the patient is at high risk given his renal insufficiency and him and his wife are quite concerned about proceeding with cardiac catheterization therefore I had him come back to the office to further discuss this at length. Of note is that the present time also the patient is complaining of some yellow phlegm and a cough. Early this month even have to take a  Z-Pak.  Preventive Screening-Counseling & Management  Alcohol-Tobacco     Smoking Status: quit     Year Quit: 1988  Current Medications (verified): 1)  Carvedilol 6.25 Mg Tabs (Carvedilol) .... Take 1 Tablet By Mouth Twice A Day 2)  Pravachol 40 Mg Tabs (Pravastatin Sodium) .... Take 1 Tab By Mouth At Bedtime 3)  Warfarin Sodium 5 Mg Tabs (Warfarin Sodium) .... Take As Directed Per Coumadin Clinic 4)  Furosemide 40 Mg Tabs (Furosemide) .... Take 1 Tablet By Mouth Once A Day Alternating With 2 Daily 5)  Ranitidine Hcl 150 Mg Caps (Ranitidine Hcl) .... Take 1 Tablet By Mouth Two Times A Day 6)  Gabapentin 300 Mg Caps (Gabapentin) .... Take 1 Tablet By Mouth Two Times A Day 7)  Fish Oil Double Strength 1200 Mg Caps (Omega-3 Fatty Acids) .... Take 1 Tablet By Mouth Once A Day 8)  Suphera 10-5 % Crea (Sulfacetamide Sodium-Sulfur) .... Apply Daily To Face For [place] 9)  Lorazepam 1 Mg Tabs (Lorazepam) .... Take 1 Tablet By Mouth Once A Day At Bedtime As Needed 10)  Percocet 5-325 Mg Tabs (Oxycodone-Acetaminophen) .... Take One By Mouth Every 4 Hours As Needed 11)  Glimepiride 2 Mg Tabs (Glimepiride) .... Take 1 Tablet By Mouth Two Times A Day 12)  Mucinex 600 Mg Xr12h-Tab (Guaifenesin) .... Take 1 Tablet By Mouth Two Times A Day As Needed 13)  Ipratropium-Albuterol 0.5-2.5 (3) Mg/46ml Soln (Ipratropium-Albuterol) .... 2 Puffs Every 4 Hours As Needed 14)  Diphenhydramine Hcl 25 Mg Tabs (Diphenhydramine Hcl) .... As Needed 15)  Aspirin 81 Mg  Tbec (Aspirin) .... Take 1 Tablet By Mouth Once A Day 16)  Enoxaparin Sodium 100 Mg/ml Soln (Enoxaparin Sodium) .... Administer 1 Syringe Subcutaneously Bid 17)  Azithromycin 500 Mg Tabs (Azithromycin) .... Use As Directed 18)  Medrol (Pak) 4 Mg Tabs (Methylprednisolone) .... Take As Directed  Allergies (verified): 1)  ! * Cefuroxime 2)  Sulfa  Comments:  Nurse/Medical Assistant: The patient's medication list and allergies were reviewed with the  patient and were updated in the Medication and Allergy Lists.  Past History:  Past Medical History: Last updated: 12/21/2010 Diabetes mellitus Type 4 renal tubular acidosis Gout CAD, NATIVE VESSEL (ICD-414.01) HYPOTENSION, ORTHOSTATIC (ICD-458.0) HYPERLIPIDEMIA-MIXED (ICD-272.4) 1. Coronary arteries with mild left ventricular dysfunction,.2010 history of inferior wall myocardial infarction 1988 status post PTCI of the RCA and stent placement to the mid LAD with bare-metal stent in 2003 complicated by iin-stent thrombosis in the absence of Plavix therapy 2. Dyslipidemia. 3. Hypertension. 4. 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 5. Gout. 6. History of cholelithiasis. 7. Type 4 renal tubular acidosis. 8. Chronic Coumadin therapy. 9. Bilateral lower extremity pain secondary to neuropathy. 10.Diabetes mellitus. 11.Recent pneumonia.  Chronic systolic heart failure Ischemic cardiomyopathy... EF 35-40%, by 2-D echo, 11/11... non-ischemic CL; inferior/inferoseptal scar, 12/10 Status post cardiac arrest questionable complete heart block versus asystole at [facility] status post CRT implantation [no ischemia evaluation performed at that time] Pneumonia December 2012 LV dysfunction December 2012 ejection fraction 20-25% post cardiac arrest and CPR    Family History: Last updated: 08/16/2009 Family History of Coronary Artery Disease:   Social History: Last updated: 08/16/2009 Married  Tobacco Use - Former.  Alcohol Use - no  Risk Factors: Smoking Status: quit (12/28/2010)  Past Cardiac History:  Cardiac Interventions: The patient is a 67-year male with history of coronary artery disease, status post inferior wall myocardial function in 1988, treated with PCI of the RCA as well as stenting to the mid LAD with bare-metal stent in 2003.  The procedure was complicated  by in-stent thrombosis in the setting of no Plavix therapy.  There was spontaneous reperfusion at the time of catheterization.  The patient's ejection fraction has improved to 42%.  Cardiac Catheterization, PTCA  Review of Systems       The patient complains of fatigue, shortness of breath, prolonged cough, dizziness, and anxiety.  The patient denies malaise, fever, weight gain/loss, vision loss, decreased hearing, hoarseness, chest pain, palpitations, wheezing, sleep apnea, coughing up blood, abdominal pain, blood in stool, nausea, vomiting, diarrhea, heartburn, incontinence, blood in urine, muscle weakness, joint pain, leg swelling, rash, skin lesions, headache, fainting, depression, enlarged lymph nodes, easy bruising or bleeding, and environmental allergies.    Vital Signs:  Patient profile:   70 year old male Height:      70 inches Weight:      219 pounds O2 Sat:      96 % on Room air Pulse rate:   74 / minute BP sitting:   106 / 71  (left arm) Cuff size:   large  Vitals Entered By: Carlye Grippe (December 28, 2010 1:59 PM)  O2 Flow:  Room air  Physical Exam  Additional Exam:  General: no apparent distress on oxygen therapy head: Normocephalic and atraumatic eyes PERRLA/EOMI intact, conjunctiva and lids normal nose: No deformity or lesions mouth normal dentition, normal posterior pharynx neck: Supple, no JVD.  No masses, thyromegaly  or abnormal cervical nodes Chest: ICD in place in the left subclavicular area. lungs: Normal breath sounds bilaterally without wheezing.  Normal percussion heart: regular rate and rhythm with normal S1 and S2, no S3 or S4.  PMI is normal.  No pathological murmurs abdomen: Normal bowel sounds, abdomen is soft and nontender without masses, organomegaly or hernias noted.  No hepatosplenomegaly musculoskeletal: Back normal, normal gait muscle strength and tone normal pulsus: Pulse is normal in all 4 extremities Extremities: No peripheral pitting  edema neurologic: Alert and oriented x 3 skin: Intact without lesions or rashes cervical nodes: No significant adenopathy psychologic: Normal affect     ICD Specifications Following MD:  Hillis Range, MD     ICD Vendor:  [employer]     ICD Model Number:  [id number]     ICD Serial Number:  [id number] ICD DOI:  11/28/2010     ICD Implanting MD:  NOT IMPLANTED BY [place]  Lead 1:    Location: RA     DOI: 11/28/2010     Model #: [id number]     Serial #: [id number]     Status: active Lead 2:    Location: RV     DOI: 11/28/2010     Model #: [id number]     Serial #: [id number]     Status: active Lead 3:    Location: LV     DOI: 11/28/2010     Model #: [id number]     Serial #: [id number]      Indications::  CM   ICD Follow Up ICD Dependent:  No      Episodes Coumadin:  Yes  Brady Parameters Mode DDDR     Lower Rate Limit:  60     Upper Rate Limit 130 PAV 130     Sensed AV Delay:  100  Tachy Zones VF:  207     VT1:  176     Impression & Recommendations:  Problem # 1:  POSTSURG PERCUT TRANSLUMINAL COR ANGPLSTY STS (ICD-V45.82) the patient has a severe ischemic cardiomyopathy with prior ejection fraction of 35-40% now declined to 20-25% and is status post cardiac arrest requiring a biventricular ICD. No cardiac catheterization was performed at that time. We initially scheduled the patient for cardiac catheterization procedures an ischemic substrate however the patient is a high-risk catheterization because of renal insufficiency. Him and his wife are quite concerned and therefore had brought the patient back to the office to further discuss the risk and benefits of the procedure. Given his severe LV dysfunction I felt it was reasonable to hold off on the catheterization and to proceed with another nuclear imaging study and compared to his prior study. It has continued scar which was extensive previously with no viability, and I do not think a repeat cardiac catheterization is indicated particularly because of his  high-risk status in addition to his hypercoagulable state. More recent data also do not support that revascularization is necessarily beneficial inpatient with LV dysfunction. Orders: Nuclear Med (Nuc Med)  Problem # 2:  SHORTNESS OF BREATH (ICD-786.05) the patient appears to have bronchitis. We will obtain a sputum specimen. In addition we will evaluate his nuclear scan for ischemia. He may need further adjustment of his medical therapy Ace on the findings.I also given the patient a Medrol Dosepak. His updated medication list for this problem includes:    Carvedilol 6.25 Mg Tabs (Carvedilol) .[name]... Take 1 tablet by mouth twice a day    Furosemide 40 Mg Tabs (  Furosemide) .[name]... Take 1 tablet by mouth once a day alternating with 2 daily    Aspirin 81 Mg Tbec (Aspirin) .[name]... Take 1 tablet by mouth once a day  Orders: Nuclear Med (Nuc Med) T-Culture, Sputum & Gram Stain ([id number])  Problem # 3:  ATRIAL FIBRILLATION (ICD-427.31) patient remained in normal sinus rhythm. Status post prior cardioversion and he will remain on Coumadin. His updated medication list for this problem includes:    Carvedilol 6.25 Mg Tabs (Carvedilol) .[name]... Take 1 tablet by mouth twice a day    Warfarin Sodium 5 Mg Tabs (Warfarin sodium) .[name]... Take as directed per coumadin clinic    Aspirin 81 Mg Tbec (Aspirin) .[name]... Take 1 tablet by mouth once a day  Problem # 4:  COUMADIN THERAPY (ICD-V58.61) the patient was bridged with Lovenox. I told the patient that we are not planning for catheterization and we will restart Coumadin 5 mg q. daily with close followup. I will have the patient followup with me by phone over the weekend regarding his INR.  Patient Instructions: 1)  Medrol dose pack 2)  Sputum gram stain - culture  & sensitivity  3)  Lexiscan stress test  4)  Continue Lovenox 5)  Resume Coumadin 5mg  every evening (tonight) 6)  [facility] to check PT/INR on Saturday & call results to Dr. Andee Lineman.    7)  Follow up in  2 weeks 8)  [facility]) notified that Oxygen was stopped on 1/19. 9)  Okay to drive per Dr. Andee Lineman. Prescriptions: MEDROL (PAK) 4 MG TABS (METHYLPREDNISOLONE) take as directed  #1 x 0   Entered by:   [name], LPN   Authorized by:   Lewayne Bunting, MD, [facility]   Signed by:   [name], LPN on 21/30/8657   Method used:   Electronically to        [facility] (retail)       [address]       [place], [place]  [address]       Ph: [phone]       Fax: [phone]   RxID:   [id number]

## 2011-01-18 NOTE — Medication Information (Signed)
Summary: coumadin management  Anticoagulant Therapy  Managed by: Vashti Hey, RN PCP: Dr. Ernestine Conrad Supervising MD: Andee Lineman MD, Michelle Piper Indication 1: Pulmonary Embolism and Infarction (ICD-415.1) Indication 2: embolism & thrombosis of unspecified site (mesente Lab Used: [facility] [place] Site: [place] INR POC 1.5  Dietary changes: no    Health status changes: no    Bleeding/hemorrhagic complications: no    Recent/future hospitalizations: no    Any changes in medication regimen? no    Recent/future dental: no  Any missed doses?: no       Is patient compliant with meds? yes      Comments: Received call from Carolin Guernsey RN [facility] with results of INR obtained on pt today.   INR 1.5    Order given for pt to increase coumadin to 2.5mg  once daily except 5mg  on M,W,F and recheck INR on 01/17/11.  Allergies: 1)  ! * Cefuroxime 2)  Sulfa  Anticoagulation Management History:      His anticoagulation is being managed by telephone today.  Positive risk factors for bleeding include an age of 72 years or older and presence of serious comorbidities.  The bleeding index is 'intermediate risk'.  Positive CHADS2 values include History of CHF and History of Diabetes.  Negative CHADS2 values include Age > 72 years old.  The start date was 12/03/1985.  Anticoagulation responsible provider: Andee Lineman MD, Michelle Piper.  INR POC: 1.5.  Exp: 10/11.    Anticoagulation Management Assessment/Plan:      The patient's current anticoagulation dose is Warfarin sodium 5 mg tabs: take as directed per coumadin clinic.  The target INR is 2 - 3.  The next INR is due 01/17/2011.  Anticoagulation instructions were given to Carolin Guernsey RN [facility]  Results were reviewed/authorized by Vashti Hey, RN.  He was notified by Carolin Guernsey RN [facility].         Prior Anticoagulation Instructions: INR 3.0 INR on 1/28 was 1.5 per pt.  Took coumadin 10mg  Sat night and 5mg  on Sun/Mon night Took last dose of Lovenox Sunday night Resume coumadin  2.5mg  once daily except 5mg  on Mondays  [facility] to recheck [facility]R on 01/08/11  Current Anticoagulation Instructions: INR 1.5 Received call from Carolin Guernsey RN [facility] with results of INR obtained on pt today.   INR 1.5    Order given for pt to increase coumadin to 2.5mg  once daily except 5mg  on M,W,F and recheck INR on 01/17/11.

## 2011-01-23 ENCOUNTER — Ambulatory Visit: Payer: Self-pay | Admitting: Cardiology

## 2011-01-24 NOTE — Progress Notes (Signed)
Summary: coumadin management  Phone Note Other Incoming   Caller: Thalia Bloodgood RN [facility] Reason for Call: Discuss lab or test results Summary of Call: Called with results of INR obtained on pt today.  INR 2.8  Order given for pt to continue coumadin 2.5mg  once daily except 5mg  on M,W,F and recheck INR on 2/12/11/10. Initial call taken by: Vashti Hey RN,  January 17, 2011 10:46 AM     Anticoagulant Therapy  Managed by: Vashti Hey, RN PCP: Dr. Ernestine Conrad Supervising MD: Diona Browner MD, Remi Deter Indication 1: Pulmonary Embolism and Infarction (ICD-415.1) Indication 2: embolism & thrombosis of unspecified site (mesente Lab Used: [facility] Site: [place] INR POC 2.8  Dietary changes: no    Health status changes: no    Bleeding/hemorrhagic complications: no    Recent/future hospitalizations: no    Any changes in medication regimen? no    Recent/future dental: no  Any missed doses?: no       Is patient compliant with meds? yes         Anticoagulation Management History:      His anticoagulation is being managed by telephone today.  Positive risk factors for bleeding include an age of 72 years or older and presence of serious comorbidities.  The bleeding index is 'intermediate risk'.  Positive CHADS2 values include History of CHF and History of Diabetes.  Negative CHADS2 values include Age > 72 years old.  The start date was 12/03/1985.  Anticoagulation responsible provider: Diona Browner MD, Remi Deter.  INR POC: 2.8.  Exp: 10/11.    Anticoagulation Management Assessment/Plan:      The patient's current anticoagulation dose is Warfarin sodium 5 mg tabs: take as directed per coumadin clinic.  The target INR is 2 - 3.  The next INR is due 01/31/2011.  Anticoagulation instructions were given to Thalia Bloodgood RN [facility].  Results were reviewed/authorized by Vashti Hey, RN.  He was notified by Thalia Bloodgood RN [facility].         Prior Anticoagulation Instructions: INR 1.5 Received call  from Carolin Guernsey RN [facility] with results of INR obtained on pt today.   INR 1.5    Order given for pt to increase coumadin to 2.5mg  once daily except 5mg  on M,W,F and recheck INR on 01/17/11.  Current Anticoagulation Instructions: INR 2.8 Called with results of INR obtained on pt today.  INR 2.8  Order given for pt to continue coumadin 2.5mg  once daily except 5mg  on M,W,F and recheck INR on 2/12/11/10.

## 2011-01-24 NOTE — Miscellaneous (Signed)
Summary: Home Care Report/ [facility]  Home Care Report/ [facility]   Imported By: Dorise Hiss 01/16/2011 12:02:08  _____________________________________________________________________  External Attachment:    Type:   Image     Comment:   External Document

## 2011-01-30 NOTE — Assessment & Plan Note (Signed)
Summary: 2 WK F/U PER 1/26 OV-OKAY W/GD TO ADD IN NEW PT SPOT-JM   Visit Type:  Follow-up Primary Provider:  Dr. Ernestine Conrad   History of Present Illness: the patient is a 70 year old male with a history of an ischemic cardiomyopathy prior ejection fraction of 30-40%. He has a history of inferior wall myocardial infarction treated in 1988 with PCI of the RCA as well as stenting to the mid LAD with bare-metal stent in 2003. The latter procedure was complicated by in-stent or bolus in the absence of Plavix therapy. The patient also has a hypercoagulable state is on chronic Coumadin therapy.  More recently the patient was admitted to an outside hospital at [facility] with shortness of breath and was found to be hypoxemic. Apparently at that time the patient suffered a cardiac arrest and ICD was implanted. This appeared to be bradycardic arrest and the patient was rested the Cath Lab where temporary pacemaker was inserted. He briefly required CPR. A biventricular ICD was placed and an echocardiogram demonstrates an ejection fraction of 20-25%  Note is that prior to this event in October the patient had been cardioverted for atrial fibrillation and was restored to normal sinus rhythm. His ejection fraction was 38% at that time and a stress test in December of 2010 was negative for ischemia. The study at that time was consistent with a significant scar inferior and inferolateral as well as antral apical.  The patient did not undergo cardiac catheterization after his cardiac arrest. We initially planned to proceed with cardiac catheterization but the patient is at high risk given his renal insufficiency and him and his wife are quite concerned about proceeding with cardiac catheterization therefore I had him come back to the office to further discuss this at length. Of note is that the present time also the patient is complaining of some yellow phlegm and a cough. Early this month even have to take a  Z-Pak.in the interim we proceeded with a Cardiolite stress study.  There was no viability in the inferior and inferoseptal walls.  There was a large fixed apical and basal inferior/inferoseptal defect associated with akinesis.  The findings were consistent with a prior myocardial infarction.  There was also a second medium fixed apical to mid anterior defect associated severe hypokinesis this defect was also consistent with a prior myocardial infarction.  Given the current findings of a Cardiolite study which is essentially unchanged with no definite evidence of ischemia and the fact that the patient is high risk for cardiac catheterization I have deferred the procedure.  Preventive Screening-Counseling & Management  Alcohol-Tobacco     Smoking Status: quit     Year Quit: 1988  Current Medications (verified): 1)  Carvedilol 6.25 Mg Tabs (Carvedilol) .... Take 1 Tablet By Mouth Twice A Day 2)  Pravachol 40 Mg Tabs (Pravastatin Sodium) .... Take 1 Tab By Mouth At Bedtime 3)  Warfarin Sodium 5 Mg Tabs (Warfarin Sodium) .... Take As Directed Per Coumadin Clinic 4)  Furosemide 40 Mg Tabs (Furosemide) .... Take 1 Tablet By Mouth Once A Day Alternating With 2 Daily 5)  Ranitidine Hcl 150 Mg Caps (Ranitidine Hcl) .... Take 1 Tablet By Mouth Two Times A Day 6)  Gabapentin 300 Mg Caps (Gabapentin) .... Take 1 Tablet By Mouth Two Times A Day 7)  Fish Oil Double Strength 1200 Mg Caps (Omega-3 Fatty Acids) .... Take 1 Tablet By Mouth Once A Day 8)  Suphera 10-5 % Crea (Sulfacetamide Sodium-Sulfur) .Marland KitchenMarland KitchenMarland Kitchen  Apply Daily To Face For Rosesea 9)  Lorazepam 1 Mg Tabs (Lorazepam) .... Take 1 Tablet By Mouth Once A Day At Bedtime As Needed 10)  Percocet 5-325 Mg Tabs (Oxycodone-Acetaminophen) .... Take One By Mouth Every 4 Hours As Needed 11)  Mucinex 600 Mg Xr12h-Tab (Guaifenesin) .... Take 1 Tablet By Mouth Two Times A Day As Needed 12)  Ipratropium-Albuterol 0.5-2.5 (3) Mg/75ml Soln (Ipratropium-Albuterol) .... 2  Puffs Every 4 Hours As Needed 13)  Diphenhydramine Hcl 25 Mg Tabs (Diphenhydramine Hcl) .... As Needed 14)  Aspirin 81 Mg Tbec (Aspirin) .... Take 1 Tablet By Mouth Once A Day  Allergies (verified): 1)  ! * Cefuroxime 2)  Sulfa  Comments:  Nurse/Medical Assistant: The patient's medication list and allergies were reviewed with the patient and were updated in the Medication and Allergy Lists.  Past History:  Past Medical History: Last updated: 12/21/2010 Diabetes mellitus Type 4 renal tubular acidosis Gout CAD, NATIVE VESSEL (ICD-414.01) HYPOTENSION, ORTHOSTATIC (ICD-458.0) HYPERLIPIDEMIA-MIXED (ICD-272.4) 1. Coronary arteries with mild left ventricular dysfunction,.2010 history of inferior wall myocardial infarction 1988 status post PTCI of the RCA and stent placement to the mid LAD with bare-metal stent in 2003 complicated by iin-stent thrombosis in the absence of Plavix therapy 2. Dyslipidemia. 3. Hypertension. 4. 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 5. Gout. 6. History of cholelithiasis. 7. Type 4 renal tubular acidosis. 8. Chronic Coumadin therapy. 9. Bilateral lower extremity pain secondary to neuropathy. 10.Diabetes mellitus. 11.Recent pneumonia.  Chronic systolic heart failure Ischemic cardiomyopathy... EF 35-40%, by 2-D echo, 11/11... non-ischemic CL; inferior/inferoseptal scar, 12/10 Status post cardiac arrest questionable complete heart block versus asystole at [facility] status post CRT implantation [no ischemia evaluation performed at that time] Pneumonia December 2012 LV dysfunction December 2012 ejection fraction 20-25% post cardiac arrest and CPR    Past Cardiac History:  Cardiac Interventions: The patient is a 67-year male with history of coronary artery disease, status post inferior wall myocardial function in 1988, treated with PCI of the  RCA as well as stenting to the mid LAD with bare-metal stent in 2003.  The procedure was complicated by in-stent thrombosis in the setting of no Plavix therapy.  There was spontaneous reperfusion at the time of catheterization.  The patient's ejection fraction has improved to 42%.  Cardiac Catheterization, PTCA  Review of Systems       The patient complains of shortness of breath, muscle weakness, and dizziness.  The patient denies fatigue, malaise, fever, weight gain/loss, vision loss, decreased hearing, hoarseness, chest pain, palpitations, prolonged cough, wheezing, sleep apnea, coughing up blood, abdominal pain, blood in stool, nausea, vomiting, diarrhea, heartburn, incontinence, blood in urine, joint pain, leg swelling, rash, skin lesions, headache, fainting, depression, anxiety, enlarged lymph nodes, easy bruising or bleeding, and environmental allergies.    Vital Signs:  Patient profile:   70 year old male Height:      70 inches Weight:      212 pounds Pulse rate:   76 / minute BP sitting:   108 / 76  (left arm) Cuff size:   large  Vitals Entered By: Carlye Grippe (January 10, 2011 1:51 PM)  Physical Exam  Additional Exam:  General: no apparent distress on oxygen therapy head: Normocephalic and atraumatic eyes PERRLA/EOMI intact, conjunctiva and lids normal nose: No deformity or lesions mouth normal dentition, normal posterior pharynx neck: Supple, no JVD.  No masses, thyromegaly or abnormal cervical nodes Chest: ICD in place in the left subclavicular area. lungs: Normal breath sounds bilaterally without wheezing.  Normal percussion heart: regular rate and rhythm with normal S1 and S2, no S3 or S4.  PMI is normal.  No pathological murmurs abdomen: Normal bowel sounds, abdomen is soft and nontender without masses, organomegaly or hernias noted.  No hepatosplenomegaly musculoskeletal: Back normal, normal gait muscle strength and tone normal pulsus: Pulse is normal in all 4  extremities Extremities: No peripheral pitting edema neurologic: Alert and oriented x 3 skin: Intact without lesions or rashes cervical nodes: No significant adenopathy psychologic: Normal affect     ICD Specifications Following MD:  Hillis Range, MD     ICD Vendor:  [employer]     ICD Model Number:  [id number]     ICD Serial Number:  [id number] ICD DOI:  11/28/2010     ICD Implanting MD:  NOT IMPLANTED BY [place]  Lead 1:    Location: RA     DOI: 11/28/2010     Model #: [id number]     Serial #: [id number]     Status: active Lead 2:    Location: RV     DOI: 11/28/2010     Model #: [id number]     Serial #: [id number]     Status: active Lead 3:    Location: LV     DOI: 11/28/2010     Model #: 1324     Serial #: [id number]      Indications::  CM   ICD Follow Up ICD Dependent:  No      Episodes Coumadin:  Yes  Brady Parameters Mode DDDR     Lower Rate Limit:  60     Upper Rate Limit 130 PAV 130     Sensed AV Delay:  100  Tachy Zones VF:  207     VT1:  176     Impression & Recommendations:  Problem # 1:  CHRONIC SYSTOLIC HEART FAILURE (ICD-428.22) the patient has severe LV dysfunction.his ejection fraction remains around 20 to 25%.  For now will continue medical therapy.  Is also status-post a CRT-D. The following medications were removed from the medication list:    Enoxaparin Sodium 100 Mg/ml Soln (Enoxaparin sodium) .[name]... Administer 1 syringe subcutaneously bid His updated medication list for this problem includes:    Carvedilol 6.25 Mg Tabs (Carvedilol) .[name]... Take 1 tablet by mouth twice a day    Warfarin Sodium 5 Mg Tabs (Warfarin sodium) .[name]... Take as directed per coumadin clinic    Furosemide 40 Mg Tabs (Furosemide) .[name]... Take 1 tablet by mouth once a day alternating with 2 daily    Aspirin 81 Mg Tbec (Aspirin) .[name]... Take 1 tablet by mouth once a day  Problem # 2:  ATRIAL FIBRILLATION (ICD-427.31) the patient remained chronically for relation.  He will continue on Coumadin. His updated  medication list for this problem includes:    Carvedilol 6.25 Mg Tabs (Carvedilol) .[name]... Take 1 tablet by mouth twice a day    Warfarin Sodium 5 Mg Tabs (Warfarin sodium) .[name]... Take as directed per coumadin clinic    Aspirin 81 Mg Tbec (Aspirin) .[name]... Take 1 tablet by mouth once a day  Problem # 3:  CORONARY ARTERY DISEASE, S/P PTCA (ICD-414.9) the patient does not need any further bridging.  There are no immediate plans for cardiac catheterization.  Medical therapy will be continued. The following medications were removed from the medication  list:    Enoxaparin Sodium 100 Mg/ml Soln (Enoxaparin sodium) .[name]... Administer 1 syringe subcutaneously bid His updated medication list for this problem includes:    Carvedilol 6.25 Mg Tabs (Carvedilol) .[name]... Take 1 tablet by mouth twice a day    Warfarin Sodium 5 Mg Tabs (Warfarin sodium) .[name]... Take as directed per coumadin clinic    Aspirin 81 Mg Tbec (Aspirin) .[name]... Take 1 tablet by mouth once a day  Patient Instructions: 1)  Appointment scheduled for:    March 5 at 3:30 with Dr. Johney Frame here in [place] office 2)  Follow up in  6 months

## 2011-01-30 NOTE — Miscellaneous (Signed)
Summary: Home Care Report/ [facility]  Home Care Report/ [facility]   Imported By: Dorise Hiss 01/23/2011 10:51:50  _____________________________________________________________________  External Attachment:    Type:   Image     Comment:   External Document

## 2011-01-31 ENCOUNTER — Encounter: Payer: Self-pay | Admitting: Cardiology

## 2011-01-31 ENCOUNTER — Telehealth (INDEPENDENT_AMBULATORY_CARE_PROVIDER_SITE_OTHER): Payer: Self-pay | Admitting: *Deleted

## 2011-02-01 ENCOUNTER — Encounter: Payer: Self-pay | Admitting: Cardiology

## 2011-02-05 ENCOUNTER — Encounter: Payer: Self-pay | Admitting: Internal Medicine

## 2011-02-05 ENCOUNTER — Encounter (INDEPENDENT_AMBULATORY_CARE_PROVIDER_SITE_OTHER): Payer: Medicare Other | Admitting: Internal Medicine

## 2011-02-05 ENCOUNTER — Encounter: Payer: Self-pay | Admitting: Cardiology

## 2011-02-05 DIAGNOSIS — I469 Cardiac arrest, cause unspecified: Secondary | ICD-10-CM

## 2011-02-05 DIAGNOSIS — I428 Other cardiomyopathies: Secondary | ICD-10-CM

## 2011-02-05 DIAGNOSIS — I5022 Chronic systolic (congestive) heart failure: Secondary | ICD-10-CM

## 2011-02-05 DIAGNOSIS — I4891 Unspecified atrial fibrillation: Secondary | ICD-10-CM

## 2011-02-05 LAB — CONVERTED CEMR LAB: POC INR: 2.4

## 2011-02-06 ENCOUNTER — Encounter: Payer: Self-pay | Admitting: Cardiology

## 2011-02-08 NOTE — Progress Notes (Signed)
Summary: Coumadin Management  Phone Note Other Incoming Call back at [phone]   Caller: [name] at [facility] Summary of Call: Returned call from [place] at [facility] concerning INR management.  Pt normally takes Coumadin 2.5 mg everday besides 5 mg on MWF.  Last INR on 2/19 was 2.8.  INR tonight was 3.14 with therapeutic range 2-3.  I have asked pt to take 2.5 mg this evening and then continue regular regimen.  Pt and [name] voiced understanding.  Of note the patient is being discharged from home health this eveening and will continue INR checks with [place].  He has an appt to have his pacer checked on Monday 3/5 and I have asked patient to have his INR rechecked at this time. He voiced understanding.  Initial call taken by: Robbi Garter NP-PA,  January 31, 2011 8:51 PM

## 2011-02-08 NOTE — Miscellaneous (Signed)
Summary: Home Care Report/  [facility]  Home Care Report/  [facility]   Imported By: Dorise Hiss 02/02/2011 11:02:28  _____________________________________________________________________  External Attachment:    Type:   Image     Comment:   External Document

## 2011-02-08 NOTE — Medication Information (Addendum)
Summary: Coumadin Clinic  Anticoagulant Therapy  Managed by: Robbi Garter NP PCP: Dr. Ernestine Conrad Supervising MD: Diona Browner MD, Remi Deter Indication 1: Pulmonary Embolism and Infarction (ICD-415.1) Indication 2: embolism & thrombosis of unspecified site (mesente Lab Used: [facility] [place] Site: [place] INR POC 3.14  Dietary changes: no    Health status changes: no    Bleeding/hemorrhagic complications: no    Recent/future hospitalizations: no    Any changes in medication regimen? no    Recent/future dental: no  Any missed doses?: no       Is patient compliant with meds? yes       Allergies: 1)  ! * Cefuroxime 2)  Sulfa  Anticoagulation Management History:      His anticoagulation is being managed by telephone today.  Positive risk factors for bleeding include an age of 72 years or older and presence of serious comorbidities.  The bleeding index is 'intermediate risk'.  Positive CHADS2 values include History of CHF and History of Diabetes.  Negative CHADS2 values include Age > 24 years old.  The start date was 12/03/1985.  Anticoagulation responsible provider: Diona Browner MD, Remi Deter.  INR POC: 3.14.  Exp: 10/11.    Anticoagulation Management Assessment/Plan:      The patient's current anticoagulation dose is Warfarin sodium 5 mg tabs: take as directed per coumadin clinic.  The target INR is 2 - 3.  The next INR is due 02/05/2011.  Anticoagulation instructions were given to Carolin Guernsey  RN [facility].  Results were reviewed/authorized by Robbi Garter NP.  He was notified by Carolin Guernsey  RN [facility].         Prior Anticoagulation Instructions: INR 2.8 Called with results of INR obtained on pt today.  INR 2.8  Order given for pt to continue coumadin 2.5mg  once daily except 5mg  on M,W,F and recheck INR on 2/12/11/10.  Current Anticoagulation Instructions: INR 3.14 Take coumadin 2.5mg  tonight then resume 2.5mg  once daily except 5mg  on M,W,F

## 2011-02-13 NOTE — Assessment & Plan Note (Signed)
Summary: NEW PACER PLACED WEEK OF 12/28 [facility]   Visit Type:  Follow-up Primary Provider:  Dr. Ernestine Conrad   History of Present Illness: [name] is a 70 year old male with a history of CAD s/p PCI of the RCA/LAD, ischemic cardiomyopathy (EF 20-25%) , and recent cardiac arrest who presents today to establish care in the EP device clinic.  [place] the [place] when [place] developed CHF.  While in the hospital, he had a bradycardic arrest and subsequently had a BiV ICD implanted. He has a h/o atrial fibrillation as well as a hypercoagulable state and therefore has been treated previously with coumadin.  The patient did not undergo cardiac catheterization after his cardiac arrest. This has been deferred due to chronic renal failure.  The patient reports doing reasonably well since his discharge.  He feels that his CHF has improved with CRT therapy.  He reports ongoing chest wall soreness s/p CPR but denies exertional symptoms.  He feels that his SOB and edema have significantly improved since his hospital discharge.  He denies palpitations, orthopnea, PND, presyncope, syncope, or ICD shocks.  Preventive Screening-Counseling & Management  Alcohol-Tobacco     Smoking Status: quit     Year Quit: 1988  Current Medications (verified): 1)  Carvedilol 6.25 Mg Tabs (Carvedilol) .... Take 1 Tablet By Mouth Twice A Day 2)  Pravachol 40 Mg Tabs (Pravastatin Sodium) .... Take 1 Tab By Mouth At Bedtime 3)  Warfarin Sodium 5 Mg Tabs (Warfarin Sodium) .... Take As Directed Per Coumadin Clinic 4)  Bumetanide 1 Mg Tabs (Bumetanide) .... Take 1 Tablet By Mouth Once A Day 5)  Ranitidine Hcl 150 Mg Caps (Ranitidine Hcl) .... Take 1 Tablet By Mouth Two Times A Day 6)  Gabapentin 300 Mg Caps (Gabapentin) .... Take 1 Tablet By Mouth Two Times A Day 7)  Fish Oil Double Strength 1200 Mg Caps (Omega-3 Fatty Acids) .... Take 1 Tablet By Mouth Once A Day 8)  Suphera 10-5 % Crea (Sulfacetamide  Sodium-Sulfur) .... Apply Daily To Face For Rosesea 9)  Lorazepam 1 Mg Tabs (Lorazepam) .... Take 1 Tablet By Mouth Once A Day At Bedtime As Needed 10)  Percocet 5-325 Mg Tabs (Oxycodone-Acetaminophen) .... Take One By Mouth Every 4 Hours As Needed 11)  Mucinex 600 Mg Xr12h-Tab (Guaifenesin) .... Take 1 Tablet By Mouth Two Times A Day As Needed 12)  Ipratropium-Albuterol 0.5-2.5 (3) Mg/85ml Soln (Ipratropium-Albuterol) .... 2 Puffs Every 4 Hours As Needed 13)  Diphenhydramine Hcl 25 Mg Tabs (Diphenhydramine Hcl) .... As Needed 14)  Aspirin 81 Mg Tbec (Aspirin) .... Take 1 Tablet By Mouth Once A Day 15)  Prednisone 20 Mg Tabs (Prednisone) .... Use As Directed 16)  Allopurinol 300 Mg Tabs (Allopurinol) .... Take 1 Tablet By Mouth Once A Day  Allergies (verified): 1)  ! * Cefuroxime 2)  Sulfa  Comments:  Nurse/Medical Assistant: The patient's medication list and allergies were reviewed with the patient and were updated in the Medication and Allergy Lists.  Past History:  Past Medical History: Reviewed history from 12/21/2010 and no changes required. Diabetes mellitus Type 4 renal tubular acidosis Gout CAD, NATIVE VESSEL (ICD-414.01) HYPOTENSION, ORTHOSTATIC (ICD-458.0) HYPERLIPIDEMIA-MIXED (ICD-272.4) 1. Coronary arteries with mild left ventricular dysfunction,.2010 history of inferior wall myocardial infarction 1988 status post PTCI of the RCA and stent placement to the mid LAD with bare-metal stent in 2003 complicated by iin-stent thrombosis in the absence of Plavix therapy 2. Dyslipidemia. 3. Hypertension. 4.  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 5. Gout. 6. History of cholelithiasis. 7. Type 4 renal tubular acidosis. 8. Chronic Coumadin therapy. 9. Bilateral lower extremity pain secondary to neuropathy. 10.Diabetes mellitus. 11.Recent pneumonia. Chronic systolic heart  failure Ischemic cardiomyopathy... EF 35-40%, by 2-D echo, 11/11... non-ischemic CL; inferior/inferoseptal scar, 12/10 Status post cardiac arrest questionable complete heart block versus asystole at [facility] status post CRT implantation [no ischemia evaluation performed at that time] Pneumonia December 2012 LV dysfunction December 2012 ejection fraction 20-25% post cardiac arrest and CPR    Past Surgical History: s/p BIV ICD 12/11  Past Cardiac History:  Cardiac Interventions: The patient is a 67-year male with history of coronary artery disease, status post inferior wall myocardial function in 1988, treated with PCI of the RCA as well as stenting to the mid LAD with bare-metal stent in 2003.  The procedure was complicated by in-stent thrombosis in the setting of no Plavix therapy.  There was spontaneous reperfusion at the time of catheterization.  The patient's ejection fraction has improved to 42%.  Cardiac Catheterization, PTCA  Family History: Reviewed history from 08/16/2009 and no changes required. Family History of Coronary Artery Disease:   Social History: Reviewed history from 08/16/2009 and no changes required. Married  Tobacco Use - Former.  Alcohol Use - no  Review of Systems       All systems are reviewed and negative except as listed in the HPI.   Vital Signs:  Patient profile:   70 year old male Height:      70 inches Weight:      219 pounds Pulse rate:   71 / minute BP sitting:   94 / 61  (left arm) Cuff size:   large  Vitals Entered By: Carlye Grippe (February 05, 2011 4:16 PM)  Physical Exam  General:  Well developed, well nourished, in no acute distress. Head:  normocephalic and atraumatic Eyes:  PERRLA/EOM intact; conjunctiva and lids normal. Mouth:  Teeth, gums and palate normal. Oral mucosa normal. Neck:  supple, JVP 8cm Chest Wall:  ICD pocket is well healed Lungs:  Clear bilaterally to auscultation and percussion. Heart:  RRR  (paced), no m/r/g Abdomen:  Bowel sounds positive; abdomen soft and non-tender without masses, organomegaly, or hernias noted. No hepatosplenomegaly. Msk:  Back normal, normal gait. Muscle strength and tone normal. Extremities:  No clubbing or cyanosis.  1+BLE edema Neurologic:  Alert and oriented x 3. Skin:  Intact without lesions or rashes. Psych:  Normal affect.    ICD Specifications Following MD:  Hillis Range, MD     ICD Vendor:  [employer]     ICD Model Number:  [id number]     ICD Serial Number:  [id number] ICD DOI:  11/28/2010     ICD Implanting MD:  NOT IMPLANTED BY [place]  Lead 1:    Location: RA     DOI: 11/28/2010     Model #: [id number]     Serial #: [id number]     Status: active Lead 2:    Location: RV     DOI: 11/28/2010     Model #: [id number]     Serial #: [id number]     Status: active Lead 3:    Location: LV     DOI: 11/28/2010     Model #: [id number]     Serial #: [id number]  Indications::  CM   ICD Follow Up ICD Dependent:  No      Episodes Coumadin:  Yes  Brady Parameters Mode DDDR     Lower Rate Limit:  60     Upper Rate Limit 130 PAV 130     Sensed AV Delay:  100  Tachy Zones VF:  207     VT1:  176     MD Comments:  see scanned report in PACEART  Impression & Recommendations:  Problem # 1:  CHRONIC SYSTOLIC HEART FAILURE (ICD-428.22) Though the patient's optivol is elevated, he appears to be stable at this time.  Salt restriction is advised.   He was recently switched from lasix to Bumex for unclear reasons.  He will weight daily and increase bumex to 2mg  daily if his weight increases by more than 2 lbs. follow-up with Dr Andee Lineman as scheduled.  Problem # 2:  CARDIAC ARREST (ICD-427.5) s/p BIV ICD after a bradycardic arrest normal device function see scanned report in paceart  Problem # 3:  ATRIAL FIBRILLATION (ICD-427.31) The patient has returned to atrial fibrillation, but does not appear to be overly symptomatic I think that rate control and coumadin are a reasonable  longterm strategy  Problem # 4:  CORONARY ARTERY DISEASE, S/P PTCA (ICD-414.9) no ischemic symptoms no changes today given renal failure, cath is deferred  Other Orders: [facility] INR (16109)  Patient Instructions: 1)  Salt restriction 2)  Daily weights 3)  Follow up with Dr. Andee Lineman   Anticoagulant Therapy  Managed by: Robbi Garter NP PCP: Dr. Ernestine Conrad Supervising MD: Diona Browner MD, Remi Deter Indication 1: Pulmonary Embolism and Infarction (ICD-415.1) Indication 2: embolism & thrombosis of unspecified site (mesente Lab Used: [facility] [place] Site: [place]  Vital Signs: Weight: 219 lbs.  Pulse Rate: 71  Blood Pressure:  94 / 61   Dietary changes: no    Health status changes: yes       Details: gout flare,rash on arms,torso  Bleeding/hemorrhagic complications: no    Recent/future hospitalizations: no    Any changes in medication regimen? yes       Details: prednison,allopurinol  Recent/future dental: no  Any missed doses?: no       Is patient compliant with meds? yes

## 2011-02-13 NOTE — Medication Information (Signed)
Summary: Coumadin Clinic  Anticoagulant Therapy  Managed by: [name], LPN Referring MD: Andee Lineman PCP: Dr. Ernestine Conrad Supervising MD: Diona Browner MD, Remi Deter Indication 1: Pulmonary Embolism and Infarction (ICD-415.1) Indication 2: embolism & thrombosis of unspecified site (mesente Lab Used: [facility] [place] Site: [place] INR POC 2.4  Dietary changes: no    Health status changes: no    Bleeding/hemorrhagic complications: no    Recent/future hospitalizations: no    Any changes in medication regimen? no    Recent/future dental: no  Any missed doses?: no       Is patient compliant with meds? yes       Allergies: 1)  ! * Cefuroxime 2)  Sulfa  Anticoagulation Management History:      The patient is taking warfarin and comes in today for a routine follow up visit.  Positive risk factors for bleeding include an age of 73 years or older and presence of serious comorbidities.  The bleeding index is 'intermediate risk'.  Positive CHADS2 values include History of CHF and History of Diabetes.  Negative CHADS2 values include Age > 70 years old.  The start date was 12/03/1985.  Anticoagulation responsible provider: Diona Browner MD, Remi Deter.  INR POC: 2.4.  Cuvette Lot#: [id number].  Exp: 10/11.    Anticoagulation Management Assessment/Plan:      The patient's current anticoagulation dose is Warfarin sodium 5 mg tabs: take as directed per coumadin clinic.  The target INR is 2 - 3.  The next INR is due 02/23/2011.  Anticoagulation instructions were given to patient.  Results were reviewed/authorized by [name], LPN.  He was notified by [name], LPN.         Prior Anticoagulation Instructions: INR 3.14 Take coumadin 2.5mg  tonight then resume 2.5mg  once daily except 5mg  on M,W,F  Current Anticoagulation Instructions: INR 2.4 Continue coumadin 2.5mg  once daily except 5mg  on M,W,F

## 2011-02-20 NOTE — Cardiovascular Report (Signed)
Summary: Office Visit   Office Visit   Imported By: Roderic Ovens 02/12/2011 14:27:09  _____________________________________________________________________  External Attachment:    Type:   Image     Comment:   External Document

## 2011-02-21 ENCOUNTER — Encounter: Payer: Self-pay | Admitting: *Deleted

## 2011-02-21 DIAGNOSIS — Z86718 Personal history of other venous thrombosis and embolism: Secondary | ICD-10-CM

## 2011-02-21 DIAGNOSIS — I82409 Acute embolism and thrombosis of unspecified deep veins of unspecified lower extremity: Secondary | ICD-10-CM

## 2011-02-21 DIAGNOSIS — K55059 Acute (reversible) ischemia of intestine, part and extent unspecified: Secondary | ICD-10-CM

## 2011-02-21 DIAGNOSIS — I4891 Unspecified atrial fibrillation: Secondary | ICD-10-CM

## 2011-02-21 DIAGNOSIS — Z7901 Long term (current) use of anticoagulants: Secondary | ICD-10-CM

## 2011-02-23 ENCOUNTER — Ambulatory Visit (INDEPENDENT_AMBULATORY_CARE_PROVIDER_SITE_OTHER): Payer: Medicare Other | Admitting: *Deleted

## 2011-02-23 DIAGNOSIS — I82409 Acute embolism and thrombosis of unspecified deep veins of unspecified lower extremity: Secondary | ICD-10-CM

## 2011-02-23 DIAGNOSIS — Z86718 Personal history of other venous thrombosis and embolism: Secondary | ICD-10-CM

## 2011-02-23 DIAGNOSIS — Z7901 Long term (current) use of anticoagulants: Secondary | ICD-10-CM

## 2011-02-23 DIAGNOSIS — I4891 Unspecified atrial fibrillation: Secondary | ICD-10-CM

## 2011-02-23 DIAGNOSIS — K55059 Acute (reversible) ischemia of intestine, part and extent unspecified: Secondary | ICD-10-CM

## 2011-02-23 LAB — POCT INR: INR: 3.5

## 2011-03-20 ENCOUNTER — Ambulatory Visit: Payer: Medicare Other | Admitting: Cardiology

## 2011-03-20 ENCOUNTER — Ambulatory Visit (INDEPENDENT_AMBULATORY_CARE_PROVIDER_SITE_OTHER): Payer: Medicare Other | Admitting: *Deleted

## 2011-03-20 DIAGNOSIS — Z7901 Long term (current) use of anticoagulants: Secondary | ICD-10-CM

## 2011-03-20 DIAGNOSIS — I4891 Unspecified atrial fibrillation: Secondary | ICD-10-CM

## 2011-03-20 DIAGNOSIS — I82409 Acute embolism and thrombosis of unspecified deep veins of unspecified lower extremity: Secondary | ICD-10-CM

## 2011-03-20 DIAGNOSIS — K55059 Acute (reversible) ischemia of intestine, part and extent unspecified: Secondary | ICD-10-CM

## 2011-03-20 DIAGNOSIS — Z86718 Personal history of other venous thrombosis and embolism: Secondary | ICD-10-CM

## 2011-03-20 LAB — POCT INR: INR: 2.8

## 2011-04-17 ENCOUNTER — Ambulatory Visit (INDEPENDENT_AMBULATORY_CARE_PROVIDER_SITE_OTHER): Payer: Medicare Other | Admitting: *Deleted

## 2011-04-17 DIAGNOSIS — I82409 Acute embolism and thrombosis of unspecified deep veins of unspecified lower extremity: Secondary | ICD-10-CM

## 2011-04-17 DIAGNOSIS — K55059 Acute (reversible) ischemia of intestine, part and extent unspecified: Secondary | ICD-10-CM

## 2011-04-17 DIAGNOSIS — I4891 Unspecified atrial fibrillation: Secondary | ICD-10-CM

## 2011-04-17 DIAGNOSIS — Z86718 Personal history of other venous thrombosis and embolism: Secondary | ICD-10-CM

## 2011-04-17 DIAGNOSIS — Z7901 Long term (current) use of anticoagulants: Secondary | ICD-10-CM

## 2011-04-17 NOTE — Assessment & Plan Note (Signed)
[facility]                          [facility] NOTE   NAME:Reffett, NIVIN BRANIFF                        MRN:          [id number]  DATE:07/29/2008                            DOB:          Aug 25, 1941    REFERRING PHYSICIAN:  Linward Foster   HISTORY OF PRESENT ILLNESS:  The patient is a 70 year old male with a  history of coronary artery disease, status post inferior wall myocardial  infarction in 1998, treated with PCI to the RCA as well as stenting to  the mid LAD with a bare-metal stent in September 2003.  The procedure  was complicated by in-stent thrombosis in the setting of no Plavix  therapy.  There was spontaneous reperfusion at the time of  catheterization.  The patient's ejection fraction has been variable, but  on the most recent echocardiogram was approximately 50%.  The patient  has had some problems with chronic dyspnea.  At one point we decided to  consider catheterization; however, given the patient's renal  insufficiency, a Cardiolite was performed first and this demonstrated  moderate anterior and periapical defect which was partially reversible  and also a large inferior defect consistent with prior infarct and his  ejection fraction was 42%.  In the interim, the patient also has been  diagnosed with type IV RTA with persistent hyperkalemia.  The patient  required Kayexalate and also started on Florinef; this caused some  hypertension and now the patient has added hydrochlorothiazide to his  medical regimen.  Overall, he has been doing quite well; if anything,  his dyspnea has now improved.  He started an exercise program.  He has  no chest pain, orthopnea or PND.  He is seeing the nephrologist per my  referral.   MEDICATIONS:  1. __________ 200 mg p.o. b.i.d.  2. Warfarin 5 mg injected.  3. Ranitidine.  4. Carvedilol 6.25 b.i.d.  5. Digoxin 0.125 mg p.o. daily.  6. __________  325 daily.  7. Simvastatin 40 mg p.o.  nightly.  8. Florinef 0.1 mg half a tablet b.i.d.   PHYSICAL EXAMINATION:  VITAL SIGNS:  Blood pressure 140/64, heart rate  64.  Weight is 235 pounds.  NECK:  Normal carotid upstroke and no carotid bruits.  LUNGS:  Clear breath sounds bilaterally.  HEART:  Regular rate and rhythm.  Normal S1 and S2.  No murmurs, rubs,  or gallops.  ABDOMEN:  Soft and nontender.  No rebound or guarding.  Good bowel  sounds.  EXTREMITIES:  No cyanosis, clubbing or edema.  NEUROLOGIC:  The patient is alert, oriented and grossly nonfocal.   PROBLEM LIST:  1. Dyspnea.      a.     Multifactorial.      b.     Mild left ventricular dysfunction.      c.     Stable volume status, BNP 287.      d.     Deconditioning.      e.     Ischemic heart disease.  2. Coronary artery disease (see details above).  3. Dyslipidemia.  4. Hypertension.  5.  Hypercoagulable state, followed by Dr. 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  6. Gout.  7. History of cholelithiasis.  8. Type IV renal tubular acidosis.  9. Chronic Coumadin therapy.  10.Bilateral lower extremity pains secondary to neuropathy.  11.Diabetes mellitus.   PLAN:  1. From a cardiovascular perspective, the patient if anything is doing      better.  2. The patient has mild renal insufficiency and has no recurrent      substernal chest pressure.  Based on the findings of his Cardiolite      study, I do feel we can continue to treat him medically for now.  3. I have asked the patient to discuss with Dr. Kenney Houseman for treatments      for his neuropathy.  4. The patient is to follow up now with a nephrologist for his      hyperkalemia secondary to type IV RTA.  5. Potassium was reviewed from a week ago and it was stable.  6. The patient will follow up with me in 6 months.     Learta Codding, MD,FACC  Electronically Signed    GED/MedQ  DD: 07/30/2007  DT:  07/31/2007  Job #: [id number]   cc:   Linward Foster

## 2011-04-17 NOTE — Assessment & Plan Note (Signed)
[facility]                          [facility] NOTE   NAME:[name], [name]                        MRN:          [id number]  DATE:02/16/2008                            DOB:          06-Mar-1941    PRIMARY CARDIOLOGIST:  Dr. Lewayne Bunting.   REASON FOR VISIT:  58-month followup.   [name] returns to the clinic since last seen here in August, by Dr.  Andee Lineman, for ongoing management of known coronary artery disease, type 4  renal tubular acidosis (for which he was placed on fludrocortisone by  Dr. Andee Lineman), mild LV dysfunction, and hypercoagulable state on chronic  Coumadin, previously followed by Dr. Cephas Darby.   Since his last visit, the patient seems to suggest no significant change  in his baseline status.  He continues to participate in cardiac rehab,  which he has done the last several years, and denies any associated  chest discomfort.  He does have some easy fatigability when walking,  however, but also refers to significant peripheral neuropathy.  He was  recently placed on gabapentin for this.   The patient otherwise denies any development of orthopnea, PND, or  worsening of his chronic lower extremity edema.   The patient is followed closely in our Coumadin clinic and is due for a  Pro Time next week.   MEDICATIONS:  Coumadin 5 mg as directed, ranitidine, carvedilol 6.25  b.i.d., digoxin 0.125 daily, coated aspirin 325 daily, simvastatin 40  daily, fludrocortisone 0.05 mg b.i.d. gabapentin 300 b.i.d. CPAP  prednisone of 20 mg p.r.n., Lasix 20 mg p.r.n.   Most recent blood work notable for potassium of 4.5, BUN 38, creatinine  1.5.   PHYSICAL EXAMINATION:  Blood pressure 144/80, pulse 60, regular weight  237.8 (up 2 pounds), sats 97% on room air.  General 70 year old male sitting upright in no distress.  HEENT:  Normocephalic, atraumatic.  NECK:  Palpable bilateral carotid pulses without bruits; no JVD.  LUNGS:  Clear  to auscultation all fields.  HEART:  Regular rate and rhythm (S1, S2) no significant murmurs.  ABDOMEN:  Protuberant, nontender, intact bowel sounds.  EXTREMITIES:  Minimally palpable peripheral pulses with 1+ nonpitting  edema.  NEURO:  Flat affect, but no focal deficit.   IMPRESSION:  1. Multivessel coronary artery disease.      a.     Status post stenting of LAD, September 2003.      b.     Status post acute anterior MI, October 2003 (off Plavix):       spontaneous reperfusion with no residual stenosis, by cardiac       catheterization.      c.     EF 23% by cath; 50% by 2-D echo, July 2008.      d.     Low-risk exercise stress Cardiolite; EF 42%, with peri-       infarct ischemia, July 2008.  2. Hypercoagulable state.      a.     Chronic Coumadin.      b.     History of recurrent DVT, pulmonary embolus,  and history of       mesenteric vein thrombosis.  3. Renal tubular acidosis (type 4).      a.     Started on hydrocortisone, per Dr. Lewayne Bunting.  4. Dyslipidemia.  5. Hypertension.  6. Chronic lower extremity edema.  7. Peripheral neuropathy.  8. Chronic renal insufficiency.   PLAN:  1. Continue current medication regimen.  Down titrate aspirin to 81 mg      daily, given the patient is on concomitant Coumadin.  2. Schedule return clinic followup with myself and Dr. Andee Lineman in 6      months.      Gene Serpe, PA-C  Electronically Signed      Learta Codding, MD,FACC  Electronically Signed   GS/MedQ  DD: 02/16/2008  DT: 02/16/2008  Job #: [id number]   cc:   Linward Foster

## 2011-04-17 NOTE — Assessment & Plan Note (Signed)
[facility]                          [facility] NOTE   NAME:[name], [name]                        MRN:          [id number]  DATE:11/05/2008                            DOB:          July 22, 1941    HISTORY OF PRESENT ILLNESS:  The patient is a 67-year male with history  of coronary artery disease, status post inferior wall myocardial  function in 1988, treated with PCI of the RCA as well as stenting to the  mid LAD with bare-metal stent in 2003.  The procedure was complicated by  in-stent thrombosis in the setting of no Plavix therapy.  There was  spontaneous reperfusion at the time of catheterization.  The patient's  ejection fraction has improved to 42%.  The patient denies any  substernal chest pain.  He was recently hospitalized with pneumonia  under the care of Dr. Gerhard Munch.  He also developed renal insufficiency and  there were number changes made in his medical regimen including his  hydrochlorothiazide that was discontinued as well as his Florinef.  He  will be seeing Dr. Orson Aloe for some concerning adenopathy.  From a  cardiovascular standpoint, however, he is doing well.   MEDICATIONS:  1. Furosemide 40 mg p.o. daily.  2. Aspirin 81 mg a day.  3. Fish oil 1200 mg p.o. daily.  4. Allopurinol 200 mg p.o. daily.  5. Glimepiride 2 mg one-half a tablet p.o. b.i.d.  6. Warfarin 5 mg as directed.  7. Ranitidine 150 mg p.o. daily.  8. Carvedilol 6.5 b.i.d.  9. Digoxin 0.125 daily.  10.Simvastatin 40 mg p.o. nightly.  11.Gabapentin 300 mg b.i.d.   PHYSICAL EXAMINATION:  VITAL SIGNS:  Blood pressure 122/74, heart rate  62, and weight 235 pounds.  NECK:  Normal carotid upstroke.  No carotid bruits.  LUNGS:  Clear breath sounds bilaterally.  HEART:  Regular rate and rhythm.  Normal sinus rhythm.  No murmurs,  rubs, or gallops.  ABDOMEN:  Soft and nontender.  No rebound or guarding.  Good bowel  sounds.  EXTREMITIES:  No cyanosis,  clubbing, or edema.  NEUROLOGIC:  The patient is alert and oriented, grossly nonfocal.   PROBLEM LIST:  1. Coronary arteries with mild left ventricular dysfunction, details      above.  2. Dyslipidemia.  3. Hypertension.  4. Hypercoagulable state, recurrent deep venous thrombosis and      pulmonary embolism and history of intestinal mesenteric vein      thrombosis.  5. Gout.  6. History of cholelithiasis.  7. Type 4 renal tubular acidosis.  8. Chronic Coumadin therapy.  9. Bilateral lower extremity pain secondary to neuropathy.  10.Diabetes mellitus.  11.Recent pneumonia.   PLAN:  1. From a cardiovascular standpoint, The patient is stable.  I made no      change in medical regimen.  2. We will follow the patient after 6 months.      Learta Codding, MD,FACC  Electronically Signed    GED/MedQ  DD: 11/05/2008  DT: 11/06/2008  Job #: [id number]

## 2011-04-17 NOTE — Assessment & Plan Note (Signed)
[facility]                          [facility]   NAME:Glick, DOYCE SALING                        MRN:          [id number]  DATE:06/11/2007                            DOB:          Aug 08, 1941    CARDIOLOGIST:  Dr. Lewayne Bunting.   PRIMARY CARE PHYSICIAN:  Was Dr. Eliberto Ivory, he will be re-established with  someone at Dr. Magnus Ivan office.   CHIEF COMPLAINT:  Two year followup.   HISTORY OF PRESENT ILLNESS:  Mr. Mccadden is a 69 year old male patient  with a history of coronary artery disease status post inferior  myocardial infarction 1988, treated with angioplasty to the RCA at [facility] who underwent stenting of the mid LAD with a  bare metal stent in September of 2003.  The patient also has a history  of hypercoagulable state and has been on Coumadin for quite some time.  He apparently has a history of DVT as well as pulmonary embolus and  mesenteric ischemia, secondary to embolus.  The patient apparently came  off of his Plavix a week after his stent placement in 2003 and re-  presented with acute anterior wall myocardial infarction.  His EF was  depressed at 23%, his LAD had reperfused at the time.  The results of  his catheterization are outlined below in the problem list.  He saw Dr.  Andee Lineman back in followup in October of 2005.  At that point in time he  was set up for a followup echocardiogram.  At that time his EF was 55%  with mild hypokinesis at the base of the inferior wall and hypokinesis  of the distal septum, mild LVH, right heart function was okay.  The  patient returns for a 2 year followup today.   The patient continues to go to cardiac rehab 5 days a week.  He works on  [employer] and bike for several minutes at a time.  He has no symptoms  with this.  However, over the last several months he has noticed  increasing substernal chest discomfort.  He can not really describe  this.  It is not like his  myocardial infarction pain.  There is no  radiation to his neck and arms.  There is no associated nausea,  diaphoresis.  He is short of breath with this.  He notes it mainly with  going up steps or going up hills.  He describes New [place] Heart  Association Class II symptoms.  Denies any orthopnea or paroxysmal  nocturnal dyspnea.  He does have pedal edema but this is chronic and  there has been no significant change recently.  His weight has been  stable.  He denies syncope or near syncope.  He has occasional  palpitations that he describes as skipping.   MEDICATIONS:  1. Ursodiol 200 mg b.i.d.  2. Warfarin as directed, followed by Dr. Magnus Ivan office.  3. Ranitidine 150 mg b.i.d.  4. Carvedilol 6.25 mg b.i.d.  5. Diovan 80 mg a day.  6. Digoxin 0.25 mg a day.  7. Ecotrin 325 mg daily.  8. Allopurinol 450 mg daily.  9. Hydrocodone/APAP 5/500 mg daily.  10.Suphera cream.  11.Lipitor--he stopped this 2 months ago secondary to leg pain.   ALLERGIES:  CARDIZEM, LOPRESSOR, AND ZESTRIL CAUSE A RASH.   SOCIAL HISTORY:  He is an ex smoker.  He quit in 1988.  Denies alcohol  abuse.  He is married.   FAMILY HISTORY:  Positive for coronary artery disease.   REVIEW OF SYSTEMS:  Please see HPI.  He has complaint of bilateral lower  extremity pain.  This has been ongoing for quite some time.  He  describes it as tingling.  He thinks it may be worse at times with  exertion.  He does wear compression hose for venous insufficiency.  He  apparently notes increasing pain when his compression hose are off.  He  denies any fevers, chills, cough, melena, hematochezia, hematuria,  dysuria.  The rest of the review of systems are negative.   PHYSICAL EXAMINATION:  He is a well-nourished, well-developed male in no  distress.  Blood pressure 135/72, pulse 63, weight 245 pounds.  HEENT:  Normal.  NECK:  Without JVD.  CARDIO:  Normal S1-S2; regular rate and rhythm without murmurs, clicks,  rubs, or  gallops.  LUNGS:  Clear to auscultation bilaterally without wheezing, rhonchi, or  rales.  ABDOMEN:  Soft, nontender with normoactive bowel sounds, no  organomegaly.  Midline scar noted.  EXTREMITIES:  With trace edema bilaterally.  Calves soft, nontender.  SKIN:  Warm and dry.  NEUROLOGIC:  He is alert and oriented x3, cranial nerves II-XII grossly  intact.  Carotids without bruits bilaterally.  Femoral artery pulses are 2+  bilaterally without bruits.  Distal pulses are intact.   Electrocardiogram reveals sinus rhythm with heart rate of 65, normal  axis, no acute changes, inferior Q waves.   IMPRESSION:  1. Chest discomfort.  2. Dyspnea on exertion.  3. Coronary artery disease.      a.     Status post inferior wall myocardial infarction 1988 treated       with angioplasty at [facility].      b.     Status post bare metal stenting to the mid left anterior       descending in September of 2003.      c.     Status post acute anterior wall myocardial infarction       October of 2003, secondary to apparent stent thrombosis in the       setting of no Plavix therapy--spontaneously re-perfused at the       time of catheterization.      d.     Cardiac catheterization results September 18, 2002:  Left       anterior descending mid stent patent, 40% mid left anterior       descending stenosis; circumflex provides robust collaterals to the       distal right coronary artery; right coronary artery totally       occluded proximally collateral as from the circumflex.  4. Ischemic cardiomyopathy with an ejection fraction of 23% at cardiac      catheterization in October 2003.      a.     Ejection fraction recovered to 55% at echocardiogram September 28, 2004.  5. Dyslipidemia.  6. Hypertension.  7. Hypercoagulable state.  8. Gout.  9. History of cholelithiasis.  10.Chronic Coumadin therapy followed by Dr. Magnus Ivan office.  11.Bilateral lower extremity  pain.      a.      R/O Claudication   PLAN:  The patient presents to the office today for routine followup.  He does have a complaint of chest discomfort, shortness of breath, as  well as leg pain.  It has been quite some time since he has been worked  up for ischemia.  It has been several years since his last cardiac  catheterization.  His symptoms are somewhat atypical.  At this point in  time I recommend proceeding with stress Cardiolite study.  I discussed  this with Dr. Andee Lineman, who agreed.  Also, will set him up for a followup  2D echocardiogram.  He will also have some blood work to include a CMET,  CBC, TSH, and a BNP.  He tried stopping his Lipitor to see if this  resolved his leg pain.  This did not occur.  He has had some trouble  affording some of his medications and I have offered changing him from  Lipitor to simvastatin 40 mg nightly.  He can get this for 4 dollars.  We will make sure he has followup lipids and LFTs in about 12 weeks.  Regarding his leg pain, this is somewhat atypical for peripheral  arterial disease, but this needs to be ruled out.  At this point in time  I have recommended we go ahead and set him up for ABIs with arterial  Dopplers.  We will have him return for early followup in a couple of  weeks.  If his ABIs are completely normal he may require referral to  Neurology.      Tereso Newcomer, PA-C  Electronically Signed      Learta Codding, MD,FACC  Electronically Signed   SW/MedQ  DD: 06/11/2007  DT: 06/11/2007  Job #: [id number]

## 2011-04-17 NOTE — Assessment & Plan Note (Signed)
[facility]                          [facility] OFFICE NOTE   NAME:[name], [name]                        MRN:          [id number]  DATE:07/01/2007                            DOB:          [birth year]    HISTORY OF PRESENT ILLNESS:  The patient is a 70 year old male with a  history of coronary artery disease status post inferior wall myocardial  infarction in 1988 treated with PCI to the RCA as well as stenting of  the mid LAD with a bare-metal stent September 2003.  This procedure was  complicated by in-stent thrombosis in the setting of no Plavix therapy.  There was spontaneous reperfusion at the time of catheterization,  though.  The patient's ejection fraction has been variable.  On the most  recent echocardiogram, ejection fraction was estimated at 50%.  By  recent nuclear studies, ejection fraction was 42%.  Due to dyspnea, the  patient was recently evaluated with a Cardiolite imaging study.  The  patient was able to achieve 7 METs and had a [facility] exercise treadmill  score of greater than 5, suggesting low-risk exercise parameters.  However, perfusion data were notable for a modest anterior and  periapical defect which was partially reversible consistent with prior  infarct or scarring and residual ischemia.  There was also a large  inferior defect consistent with prior infarct and/or scar.  During the  last office visit on June 19, 2007, although the patient had dyspnea, I  felt no immediate need to proceed with catheterization based on the  nuclear data, particularly in light of the low-risk exercise parameters.  The patient also reported no substernal chest pain.  In addition, he was  found to have renal insufficiency with a grade 2-3 kidney disease.  The  patient has never been treated for diabetes but on fasting blood sugars  he had elevated blood sugars and I suspect the patient has glucose  intolerance.   However, another predominant  complaint the patient had at the last  office visit was pain and burning of the lower extremities suggestive of  a neuropathy.  In the past he has taken prednisone on a p.r.n. basis.  Doppler studies showed no significant arterial insufficiency.   When laboratory work was drawn during this particular office visit the  patient also had a creatinine of 1.4 and a potassium of 5.7.  His blood  sugar was 127 fasting.  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  Reviewing of his laboratory work indeed  revealed persistent elevations of potassium levels.  His EKG at time,  however, showed no untoward cardiac effects from hyperkalemia; in  particular, there were no tented T waves.  I felt at that time that the  patient could likely be treated with a low-dose diuretic in order to  reverse the hyperkalemia.  We have followed the blood work in the  interim very closely and despite diuresis, the patient actually became  more prerenal with a creatinine up to 1.7 to 1.8 and with no appreciable  drop in his  potassium levels.  This was also despite the fact that the  patient has now cut back on his potassium-containing foods.  In  particular, he does not drink his orange juice anymore.  In the past he  used to eat bananas and he has also cut these out.  He is very conscious  now of avoiding excess potassium in his diet.  In addition, we had also  held the patient's Diovan, but again, there has been no appreciable  effect in the lowering of his potassium levels.  He tells me that the  only thing that works for his burning in the lower extremities is  prednisone.  Indeed, the patient took a dose of prednisone 10 mg last  night.  He states that Vicodin does little.  Of note is that the patient  does not have any substernal chest pain.  He has got chronic dyspnea,  NYHA class IIb.  He does not appear to have any unstable symptoms of  chest pain, however.   MEDICATIONS:  1.  Ursodiol 200 mg p.o. b.i.d.  2. Warfarin as directed.  3. Ranitidine 150 mg p.o. b.i.d.  4. Carvedilol 6.25 mg p.o. b.i.d.  5. Digoxin 0.125 mg p.o. daily.  6. Ecotrin 325 mg p.o. daily.  7. Allopurinol 450 mg p.o. daily.  8. Hydrocodone/APAP 5/500 mg p.o. daily.  9. Lipitor discontinue 2 months ago.  10.Simvastatin 40 mg p.o. q.h.s.  11.Lasix 20 mg p.o. daily.   PHYSICAL EXAMINATION:  VITAL SIGNS:  Blood pressure is 132/68, heart  rate is 57, weight not done.  HEENT:  Neck exam with normal carotid upstroke, no carotid bruits.  LUNGS:  Clear breath sounds bilaterally.  HEART:  Regular rate and rhythm.  Normal S1, S2.  No murmurs, rubs, or  gallops.  ABDOMEN:  Soft, nontender.  No rebound or guarding.  Good bowel sounds.  EXTREMITIES:  No cyanosis, clubbing or edema.  NEUROLOGIC:  The patient is alert, oriented, and grossly nonfocal.   PROBLEM LIST:  1. Dyspnea (multifactorial).      a.     Mild left ventricular systolic dysfunction, ejection       fraction 42%.      b.     Rule out volume overload (no peripheral edema currently post       Lasix therapy).      c.     Deconditioning.  2. Coronary artery disease.      a.     Status post inferior wall myocardial infarction 1988 treated       with percutaneous coronary intervention.      b.     Status post bare-metal stent to the mid left anterior       descending artery September 2003.      c.     Status post acute anterior wall myocardial infarction       October 2003 secondary to in-stent thrombosis in the setting of no       Plavix therapy (spontaneous reperfusion during catheterization).      d.     Catheterization September 18, 2002:  Left anterior descending       artery stent patent, 40% mid left anterior descending artery,       circumflex provides for both collaterals to the distal right       coronary artery with the right coronary artery totally occluded       proximally (Q waves on EKG in inferior leads).  3.  Dyslipidemia.  4. Hypertension, stable.  5. Hypercoagulable state (followed by Dr. Cyndie Chime).      a.     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      b.     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  6. Gout.  7. History of cholelithiasis.  8. Chronic Coumadin therapy secondary to hypercoagulable state.  9. Bilateral lower extremity pain secondary to presumed neuropathy.  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  11.(Possible) diabetes mellitus.   PLAN:  1. The patient presents an extremely complicated situation.  However,      it does appear that he has what I suspect is stable coronary artery      disease.  His 12-lead electrocardiogram demonstrates inferior Q      waves consistent with an old inferior wall myocardial infarction      and a chronically occluded right coronary artery.  As a matter of      fact, his Cardiolite stress study shows scar in this area and also      predominantly fixed defect in the anterior wall with a very mild      degree of reversibility.  His ejection fraction is 42%.  In the      absence of symptoms of chest pain and worsening dyspnea, I do not      think a catheterization is indicated.  As a matter of fact, there      is a significant contraindication to proceed with cardiac      catheterization due to the patient's now worsening renal      insufficiency with a creatinine of 1.7 to 1.8.  2. Despite the above measures as outlined above to treat the patient's      what I presume is a type 4 RTA, we have not been able to make an      appreciable dent in his potassium levels.  I did an EKG in the      office today and there is no definite evidence of tented T waves or      evidence of hyperkalemia changes on the electrocardiogram.  I do      feel that we should proceed with giving the patient a dose of      Kayexalate to lower his  potassium levels on a one-time basis.  I      attempted to lower the patient's potassium level with Lasix, but      this has not caused a drop in his potassium levels despite cutting      out potassium in his diet as well as stopping his Diovan.  If      anything, the Lasix has now caused some prerenal azotemia with an      increase in his creatinine, suggesting that the patient is not      volume overloaded; if anything, slightly volume depleted.      Therefore, based on this interpretation, I have started the patient      on fludrocortisone 0.2 mg p.o. daily.  We will need to monitor the      patient's blood pressure very carefully, particularly now that he      is off Diovan.  He is normotensive in the office today.  I also      told him that he can take prednisone on  a p.r.n. basis in      conjunction with his fludrocortisone, as there is no direct      contraindication in untoward effects for his potassium levels.  He      also reports to me that prednisone is only thing that helps him in      regards to his lower extremity pain.  I do not suspect this is gout      but could have a nonspecific arthritis.  3. I have asked the patient to be set up with a primary care physician      which he has done so, with Dr. Gerhard Munch, and he has an appointment in      the next couple of weeks.  4. I also had called earlier Dr. Susa Griffins office but unfortunately,      they have not gotten in touch with the patient yet and we will      reschedule this appointment.  5. From a practical perspective, the patient will now hold his Lasix,      hold his allopurinol in the setting of his worsening renal      insufficiency, I have encouraged him to drink fluids, we have      started him on fludrocortisone 0.2 mg a day, I have also given him      a one-time dose of Kayexalate 30 g x1 today.  6. We will repeat the patient's BMET and BNP level as well as a      digoxin level in the morning,      and I have  asked him to monitor carefully his blood pressure on      fludrocortisone.  7. I will monitor the patient's potassium level in the next couple of      days very carefully.     Learta Codding, MD,FACC  Electronically Signed    GED/MedQ  DD: 07/01/2007  DT: 07/02/2007  Job #: [id number]

## 2011-04-17 NOTE — Assessment & Plan Note (Signed)
[facility]                          [facility] NOTE   NAME:[name], [name]                        MRN:          [id number]  DATE:06/19/2007                            DOB:          Feb 18, 1941    HISTORY OF PRESENT ILLNESS:  The patient is a 70 year old male with  history of coronary artery disease status post inferior wall myocardial  infarction in 1988, treated with PCI to the RCA as well as stenting of  the mid LAD with a bare metal stent in September 2003.  This procedure  was complicated by in-stent thrombosis in the setting of no Plavix  therapy.  There was spontaneous reperfusion at the time of  catheterization.  The patient's ejection fraction has been variable.  On  most recent echocardiogram, the ejection fraction was estimated at 50%.  By recent nuclear study, his ejection fraction was 42%.  At some point  in the past, it actually was down to 25%.   The patient now reports increased symptoms of dyspnea and decreased  exercise tolerance.  Unfortunately, he does little activity.  He also  seems to be complaining of pain and burning in the lower extremities  which I suspect is a neuropathy.  Dopplers were done which actually  showed that they were within normal limits bilaterally with no evidence  of PAD.  Of note, the patient had laboratory work done during this last  visit with significant hyperkalemia with a potassium of 5.7.  He also  has mild chronic renal insufficiency with a creatinine of 1.4.  He was  advised not occupational therapy take any potassium supplement as well  as avoid any potassium-containing foods.  However, on repeat, his  potassium has remained elevated.  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   The patient denies any substernal chest pain.  He also has been recently  diagnosed with sleep apnea and has been placed on CPAP.   PHYSICAL EXAMINATION:  VITAL SIGNS:   Blood pressure is 145/76.  Heart  rate is 54 beats per minute.  Weight is 245 pounds.  GENERAL:  Overweight white male in no apparent distress.  HEENT:  Normal carotid upstroke.  No carotid bruits.  LUNGS:  Clear.  Breath sounds bilaterally.  HEART:  Regular rate and rhythm, normal S1, S2.  No murmurs, rubs, or  gallops.  ABDOMEN:  Soft and nontender.  No rebound or guarding.  Good bowel  sounds.  EXTREMITIES:  No cyanosis, clubbing.  There is 3+ peripheral pitting  edema; however.  NEUROLOGIC:  The patient alert and oriented and grossly nonfocal.   PROBLEM LIST:  1. Dyspnea.      a.     Multifactorial.      b.     Mild left ventricular dysfunction.      c.     Volume overload with peripheral edema.      d.     Deconditioning.  2. Coronary artery disease.      a.     Status post inferior wall  myocardial infarction in 1988 that       treated with angioplasty.      b.     Status post bare metal stent to mid left anterior descending       September 2003.      c.     Status post acute anterior wall myocardial infarction       October 2003 secondary to in-stent thrombosis in the setting of no       Plavix therapy, spontaneously perfuse abdominal catheterization.      d.     Catheterization September 18, 2002, left anterior descending       stent patent, 40% mid left anterior descending, circumflex       provides for both collaterals to the distal right coronary artery.       Right coronary artery totally occluded proximally.  3. Dyslipidemia.  4. Hypertension.  5. Hypercoagulable state.  6. Gout.  7. History of cholelithiasis.  8. Chronic Coumadin therapy.  9. Bilateral lower extremity pain secondary to neuropathy.  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  11.Newly diagnosed diabetes mellitus.  Seeking blood sugars 146 and      127, respectively.   PLAN:  1. The patient's cardinal symptom is dyspnea.  The patient had a      Cardiolite study done which was  ordered by Tereso Newcomer.  This      showed a fixed inferior defect and a partial reversible anterior      defect; however, reviewing the patient's catheterization report and      his coronary anatomy, I suspect his findings are chronic.  He has      no chest pain, and I am not sure that his dyspnea is secondary to      worsening left anterior descending disease.  I still told the      patient that he may ultimately require cardiac catheterization, but      that other issues are determining the clinical picture.  2. I suspect the patient's dyspnea may be secondary to volume overload      in setting of renal insufficiency.  I placed him on Lasix 20 mg      p.o. daily.  He has also significant peripheral edema.  He also has      hyperkalemia which I suspect is due to type 4 hyperaldosteronism in      the setting of newly diagnosed diabetes mellitus.  We will recheck      his potassium next week.  I suspect the patient's lower extremity      pain is likely secondary to diabetic neuropathy.  I have ordered a      hemoglobin A1c to confirm his diagnosis of diabetes.  I asked him      to go see Dr. Channing Mutters to establish a primary care physician as I      suspect he will soon need treatment with oral hypoglycemic therapy.  3. The patient will come back to [place] in the next couple of weeks and if      there is no improvement in his level of dyspnea, we will ultimately      consider cardiac catheterization.  This will be a left and right      heart catheterization.  The patient, however, is concerned that he      is not willing to proceed at this point in time.  Also, the issue      is complicated by  the fact that he has a hypercoagulable state and      likely needs to be bridged with Lovenox therapy prior to an      outpatient cardiac catheterization.    Learta Codding, MD,FACC  Electronically Signed   GED/MedQ  DD: 06/19/2007  DT: 06/20/2007  Job #: [id number]   cc:   Linward Foster

## 2011-04-17 NOTE — Assessment & Plan Note (Signed)
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  We have mainly followed  him over the last several weeks for his hyperkalemia.  He received  Kayexalate, and we placed the patient on Florinef 0.2 mg p.o. daily.  His potassium has been decreased to 5.1.  Unfortunately, he did develop  increasing blood pressure and some lower extremity edema, which I  anticipate.  Overall, the patient states that he is stable and has no  new symptoms of chest pain or shortness of breath.  We have discussed on  the last office visit implications of his Cardiolite study.   MEDICATIONS:  1. Ursodiol 200 mg p.o. b.i.d.  2. Warfarin 5 mg as directed.  3. Ranitidine 150 mg p.o. b.i.d.  4. Carvedilol 6.25 mg b.i.d.  5. Digoxin 0.125 mg p.o. daily.  6. Ecotrin 325 mg p.o. daily.  7. Simvastatin 40 mg p.o. nightly.  8. Florinef 0.2 mg p.o. daily.   PHYSICAL EXAMINATION:  VITAL SIGNS:  Blood pressure 160/81, heart rate  65 beats per minute.  Weight is 240 pounds.  NECK:  Normal carotid upstrokes.  No carotid bruits.  LUNGS:  Clear.  HEART:  Regular rate and rhythm.  Normal S1 and S2.  No murmurs, gallops  or rubs.  ABDOMEN:  Soft, nontender.  No rebound or guarding.  Good bowel sounds.  EXTREMITIES:  No edema.   PROBLEM LIST:  1. Please see my dictated note from July 01, 2007.  2. Hyperkalemia:  Improved, potassium 5.1.  3. Lower extremity edema secondary to Florinef therapy.   PLAN:  1. I have adjusted the patient's Florinef down to 0.1 mg p.o. daily.      I also started him on hydrochlorothiazide 25 mg p.o. daily, which  should help with his hyperkalemia and at the same time hopefully      improve his lower extremity edema and counteract some of the salt      retention.  2. BMET and BNP will be drawn tomorrow.  3. Patient will be seen by Dr. Kristian Covey.  4. I have also told the patient that his blood sugars remain elevated,      and he likely needs to start on treatment for diabetes mellitus.     Learta Codding, MD,FACC  Electronically Signed    GED/MedQ  DD: 07/08/2007  DT: 07/08/2007  Job #: [id number]

## 2011-04-20 NOTE — Cardiovascular Report (Signed)
NAME:  [name], [name]                           ACCOUNT NO.:  [id number]   MEDICAL RECORD NO.:  [id number]                   PATIENT TYPE:  INP   LOCATION:  6525                                 FACILITY:  [facility]   PHYSICIAN:  Salvadore Farber, M.D. [facility]         DATE OF BIRTH:  1941-06-10   DATE OF PROCEDURE:  08/14/2002  DATE OF DISCHARGE:                              CARDIAC CATHETERIZATION   PROCEDURES:  Coronary angiography, left heart catheterization, left  ventriculography, stent to left anterior descending, failed Perclose of the  right femoral artery.   INDICATIONS:  The patient is a 70 year old man who suffered an inferior  myocardial infarction in 1988.  He underwent angioplasty to the RCA at  [facility] then.  He has done relatively well since. He recently has  developed chest pain including chest pain at rest. He was admitted to the  hospital and adenosine Cardiolite demonstrated inferior fixed defect with  superimposed ischemia and anterior reversible defect. He is therefore  referred for diagnostic angiography.   DIAGNOSTIC TECHNIQUE:  Informed consent was obtained. Under 2% lidocaine  local anesthesia, a 6 French sheath was placed in the right femoral artery  using the modified Seldinger technique. Diagnostic angiography was performed  using 6 [place] JL4 and JR4 catheters.  A pigtail catheter was advanced into  the left ventricle.  Pressures were measured. Left ventriculography was  performed by power injection. The case then proceeded to intervention.   DIAGNOSTIC FINDINGS:  1. LV:  EF equals 46% with posterobasal akinesis.  2. No aortic stenosis on pullback.  3. No mitral regurgitation.  4. Left main:  Angiographically normal.  5. LAD:  The LAD is a large vessel which supplies a single very large     diagonal branch. There is a 90% stenosis of the mid vessel just after the     origin of the diagonal branch. Just down stream from this stenosis there     is a  second 60% stenosis. Both lesions are calcified. There is a 50%     stenosis in the ostium of the diagonal branch. The LAD supplies     collateral flow to the distal right coronary.  6. Circumflex:  There is a 30% stenosis of the mid vessel extending into the     ostium of the single obtuse marginal branch. This obtuse marginal also     supplies collateral flow to the PLV.  7. RCA:  The proximal right coronary is totally occluded over a long     segment. There are modest bridging collaterals which supply the distal     vessel.   DIAGNOSTIC IMPRESSION:  Chronically occluded right coronary artery  collateralized from both the right and the left.  The culprit lesion appears  to be a 90% stenosis in the mid left anterior descending. We will therefore  proceed to intervention of the left anterior descending.   INTERVENTIONAL TECHNIQUE:  Anticoagulation was initiated with Integrilin and  heparin to achieve an ACT of greater than 250 seconds.  A 6 French CLS 3.5  guide was advanced over a wire and engaged in the ostium of the left main  coronary artery. The lesion was crossed with a BMW wire which was positioned  in the distal LAD.  A 2.5 x 12 mm Quantum balloon was used to pre-dilate the  lesion at 12 atmospheres. A 2.5 x 12 mm Express stent was advanced and  positioned across the lesion, set just distal to the ostium of the first  diagonal branch. It was then deployed at 14 atmospheres.  The diagonal  remained uncompromised. The stent was then post-dilated with a 2.75 x 12 mm  Quantum at 12 atmospheres. Final angiograms demonstrated no residual  stenosis and TIMI-3 flow. The stenosis at the ostium of the diagonal branch  was unchanged.   IMPRESSION/PLAN:  Successful stenting of the mid left anterior descending  resulting in no residual stenosis. The chronically occluded right coronary  artery is treated with medical therapy.  Unfortunately, he had an  unsuccessful Perclose closure of the  right femoral artery. The artery is  stable with Fem-stop pressure.   We will continue Integrilin for 18 hours. Plavix will be continued for 30  days.  Aspirin will be continued indefinitely.  Given his multiple arterial  and venous thrombotic events, we will keep him in the hospital while he is  re-coumadinized. For 30 days he will be on a combination of aspirin,  Coumadin and Plavix. The patient will need to be cautioned about elevator  risk of bleeding event on this combination of therapies.                                                                 Salvadore Farber, M.D. [facility]    WED/MEDQ  D:  08/14/2002  T:  08/16/2002  Job:  [id number]

## 2011-04-20 NOTE — Op Note (Signed)
NAME:  [name]                           ACCOUNT NO.:  [id number]   MEDICAL RECORD NO.:  [id number]                   PATIENT TYPE:  INP   LOCATION:  4712                                 FACILITY:  [facility]   PHYSICIAN:  Lowell C. Catha Gosselin, M.D.               DATE OF BIRTH:  1940-12-14   DATE OF PROCEDURE:  09/26/2002  DATE OF DISCHARGE:                                 OPERATIVE REPORT   REASON FOR CONSULTATION:  Mr. [name] was seen today by request of  Cardiology Service.   HISTORY OF PRESENT ILLNESS:  His past medical history is quite impressive  from a coagulopathy standpoint.  He is certainly a very well known patient  in the oncology clinic.  He is 70 years old and followed by Dr. Cyndie Hamilton  in [place].  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  He had pulmonary emboli in 1988 treated with Coumadin.  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  He was in the hospital three months with  multiple complications including dialysis for a time.  He has a history of  cholelithiasis but that has been considered too high a risk for gallbladder  surgery. He has not smoked at least since 1998.  No alcohol use.  He has had  a very complete hypercoagulable work-up in the past including a lupus  anticoagulant panel done by hexagonal phase phospholipid assay which is  state of the art.  He has had normal antithrombin 3 level.  He had a  negative factor V of Leiden study and negative study for prothrombin gene  mutation.  He has had a negative study for hyperhomocysteinemia.  Protein S  and C levels have not been checked because he has been on chronic Coumadin  use.   RECOMMENDATIONS:  From a hematology standpoint, it is clear that he needs  lifelong coumadinization with an INR of 2.5 to 3.  When he came into the  hospital he actually had a therapeutic INR during this admission.  On  September 24, 2002 his INR was 2.5.  He was not on any anti platelet agents,  however.  He had taken some aspirin in the past but had taken off of it.   Since being here he has been discovered to have a non Q-wave myocardial  infarction.  There is some suggestion of an LAD thrombosis.  Right now he is  on medical treatment with heparin, Integrilin and aspirin, beta blockers and  nitrates.  It is planned that he will go to cardiac catheterization on  Monday.  His INR is being allowed to drift down while he is on heparin prior  to this procedure.   From our standpoint, it looks pretty clear that he is going  to need lifelong  Coumadinization to be restarted.  That will provide him with venous  protection from thrombosis.  He also will likely need lifelong antiplatelet  therapy as well to protect his coronary arteries.  Some of this will depend  on what is found at cardiac catheterization but again it looks like he will  require antiplatelet agents to protect the arteries and Coumadin to protect  his veins.  There has never been able to be an exact name put on his  coagulopathy but it is pretty clear from his history that he has one and  those will be our best recommendations.   I have discussed this in detail with the patient and his wife and how the  coagulation system works.  They understand this.  At discharge I will set a  return visit up to clinic with  Dr. Cyndie Hamilton in [place].  I left some clinic  records in the chart for your perusal.   If you have any further questions, please feel free to call me.                                               Lowell C. Catha Gosselin, M.D.    LCS/MEDQ  D:  09/26/2002  T:  09/28/2002  Job:  [id number]   cc:   Hayden Millers, MD [facility]   [name], M.D.   [name], M.D.  [address] [facility]  [place]  [place] [address]  Fax: [phone]   [name], M.D.

## 2011-04-20 NOTE — H&P (Signed)
NAME:  [name], [name]                           ACCOUNT NO.:  [id number]   MEDICAL RECORD NO.:  [id number]                   PATIENT TYPE:  INP   LOCATION:  2101                                 FACILITY:  [facility]   PHYSICIAN:  Doylene Canning. Ladona Ridgel, M.D. [facility]           DATE OF BIRTH:  Aug 24, 1941   DATE OF ADMISSION:  09/23/2002  DATE OF DISCHARGE:                                HISTORY & PHYSICAL   ADMISSION DIAGNOSIS:  Acute coronary syndrome with probable non-Q-wave  myocardial infarction.   HISTORY OF PRESENT ILLNESS:  The patient is a very pleasant 70 year old man  who was initially admitted for evaluation back in September.  At that time  he had substernal chest pain.  He subsequently underwent left heart  catheterization demonstrating occluded right coronary artery with minimal  collateralization, and a 90% mid left anterior descending stenosis.  He  subsequently underwent percutaneous angioplasty by Dr. Samule Ohm with stenting.  Because of his chronic Coumadin therapy that he has for his recurrent deep  vein thromboses and pulmonary embolism, a coated stent was not used.  The  patient was re-treated with Coumadin and discharged home.  He did well until  today when at approximately 4:00 this evening he developed recurrent  substernal pain associated with diaphoresis and minimal shortness of breath.  He states that his pain was just like it was prior to his prior  intervention, but more intense.  He presented to the [facility]  where the initial EKG demonstrates inferior ST elevation of less than 1 mm  in lead 3 and lead aVF.  There was ST segment depression in leads V3, 4, 5,  and 6, and questionable T-waves in lead V2 and V1.  The patient was treated  with IV nitroglycerin and heparin, and transferred here for additional  evaluation.  His chest pain has improved, but still present.  He  subsequently has been treated with IV Lopressor resulting in his heart rate  decreasing from  95 down to approximately 80 with improvement in his  symptoms.  The patient denies shortness of breath at the present time.  He  denies any history of palpitations.   PAST MEDICAL HISTORY:  1. Hypertension.  2. Recurrent deep vein thromboses.  3. Chronic Coumadin therapy secondary to his deep vein thromboses.   SOCIAL HISTORY:  The patient smoked cigarettes until 1988 when he stopped  smoking, and has not done so since.  He denies any alcohol abuse.  He is  married.   FAMILY HISTORY:  Positive for coronary disease.   REVIEW OF SYMPTOMS:  Negative for vision or hearing problems.  He denies any  arthritic complaints.  He denies any skin problems.  He denies any chest  pain except as previously noted.  He denies shortness of breath, hemoptysis,  or cough.  He denies nausea, vomiting, diarrhea, constipation, except as  previously noted.  He denies any arthritic  symptoms.  He denies any skin  changes.  He denies any bleeding or other hematologic problems at the  present time.   PHYSICAL EXAMINATION:  GENERAL:  He is a very pleasant middle-aged man who  is somewhat ill-appearing and mildly dyspneic.  VITAL SIGNS:  His blood pressure was initially 145/90, and after 10 of  Lopressor it has decreased to 130/70, heart rate of 70.  Respirations are  now 18.  Previously they were 22.  HEENT:  Normocephalic, atraumatic.  Pupils are equal and round.  The  oropharynx is moist.  NECK:  There is 7 to 8 cm of jugular venous distention, the carotids were 2+  and symmetric, trachea was midline.  There is no obvious thyromegaly.  LUNGS:  Clear bilaterally to auscultation except for rales in the bases  bilaterally.  CARDIOVASCULAR:  Regular rate and rhythm with a S4 gallop.  ABDOMEN:  Soft, nontender, nondistended, no organomegaly.  EXTREMITIES:  No cyanosis, clubbing, or edema.  The pulses were 2+ and  symmetric.   LABORATORY DATA:  The EKG demonstrates normal sinus rhythm with inferior   myocardial infarction, but resolution of the ST-T wave abnormalities  previously described.   IMPRESSION:  1. Acute coronary syndrome with probable non-Q-wave myocardial infarction,     likely secondary to left anterior descending thrombosis.  2. History of hypertension.  3. Hypercoagulable state.  4. Chronic Coumadin therapy with therapeutic INR of 2.2.   DISCUSSION:  I have discussed the treatment options with the family.  I am  concerned about bleeding complications, and for this reason we will not  initially proceed with urgent heart catheterization.  Rather, we will start  the patient on Integrilin, continue IV Lopressor in an attempt to get his  rate pressure product under control.  If his chest discomfort cannot be  relieved, then we will plan to proceed with a somewhat increased risk of  percutaneous coronary intervention.  We will hold Coumadin for now.                                               Doylene Canning. Ladona Ridgel, M.D. [facility]    GWT/MEDQ  D:  09/23/2002  T:  09/23/2002  Job:  [id number]   cc:   Winona Legato, M.D.  Charyl Bigger, M.D. [facility]   Salvadore Farber, M.D. [facility]  [address] [place], [place] [address]  Fax: [phone]

## 2011-04-20 NOTE — Discharge Summary (Signed)
NAME:  [name], [name]                           ACCOUNT NO.:  [id number]   MEDICAL RECORD NO.:  [id number]                   PATIENT TYPE:  INP   LOCATION:  4712                                 FACILITY:  [facility]   PHYSICIAN:  Chinita Pester, C.R.N.P. [facility]           DATE OF BIRTH:  02/20/1941   DATE OF ADMISSION:  09/23/2002  DATE OF DISCHARGE:  10/05/2002                                 DISCHARGE SUMMARY   HISTORY OF PRESENT ILLNESS:  This is a 70 year old gentleman who was  initially admitted back in September. At that time he had substernal chest  pain and subsequently underwent a left heart catheterization demonstrating  occluding right coronary artery with minimal collateral and a 90% mid left  anterior descending stenosis. He subsequently underwent PCI by Dr. Samule Ohm  with stenting. Because of his chronic Coumadin therapy that he has had for  recurrent DVT and pulmonary embolism, a coated stent was not used. The  patient was  retreated with Coumadin and discharged home. He did well until  the day of admission, when at approximately 4 that evening he developed  recurrent substernal pain associated with diaphoresis and minimal shortness  of breath. He states that the pain was just like it was prior to his  intervention but more intense. He presented to [facility] where the  initial EKG demonstrated inferior ST elevation of less than 1 mm in lead 3  and  VF, ST depression in V3, 4, 5 and 6, and T-wave in leads V2 and V1. The  patient was  treated with IV nitroglycerin and heparin and transferred to  [facility].   The patient was treated with Integrelin. The patient was seen  by Dr.  Cyndie Chime for his hypercoagulable state. The patient developed a rash  after he received Lopressor. The Lopressor was then discontinued. He  underwent a cardiac catheterization on September 28, 2002, which showed left  main coronary angiography normal,  LAD descending previously placed stent  in  the mid LAD is widely patent, TIMI 3 flow to the distal vessel, 40% stenosis  in the mid LAD just to the stent. The circumflex had mild luminal  irregularity. The distal circumflex provides robust collaterals to the  distal RCA. The RCA to the right coronary is proximally occluded. The distal  artery is collateralized primarily from the circumflex. His EF was 23% with  posterior basilar and anterolateral akinesis. No aortic stenosis, 1+ mitral  regurgitation.   It was thought that the patient had spontaneous reperfusion with no  residual. He was restarted on Coumadin therapy, as the patient was  to be  treated for his cardiomyopathy. He was started on Coreg, Aldactone and  digoxin and Cozaar. After the patient's INR was therapeutic on the day of  discharge it was 2.1, he was discharged to home in stable condition.   DISCHARGE MEDICATIONS:  1. Plavix 75 mg q.d. x  3 months.  2. Niacin 500 nightly.  3. Zantac 150 q.d.  4. Digoxin 0.125 q.d.  5. Cozaar 25 q.d.  6. Coated aspirin 81 q.d.  7. Coreg 6.25 b.i.d.  8. Aldactone  25 q.d.  9. Coumadin 5 mg alternating with 7.5 mg nightly.  10.      Actigall as before.  11.      Pain management was not applicable.   DISCHARGE INSTRUCTIONS:  Activity had no restrictions. Diet low fat, low  cholesterol, low salt diet. He was to call for any drainage or if he  developed a lump in his groin. A PT, INR was scheduled for Wednesday at 8:30  p.m.   FOLLOW UP:  He was to follow in [facility] with Dr. Cyndie Chime on  October 28, 2002, at 2:40. He was scheduled to see the physician assistant  at [facility] on October 19, 2002, at 1:30 p.m. and Dr. Diona Browner within four  to six weeks and the office will notify him for that.                                                 Chinita Pester, C.R.N.P. [facility]    DS/MEDQ  D:  10/05/2002  T:  10/06/2002  Job:  [id number]   cc:   Doylene Canning. Ladona Ridgel, M.D. [facility]   Genene Churn. Cyndie Chime, M.D.  [address]  [facility]  [place]  [place] [address]  Fax: [phone]

## 2011-04-20 NOTE — Discharge Summary (Signed)
   NAME:  [name], [name]                           ACCOUNT NO.:  [id number]   MEDICAL RECORD NO.:  [id number]                   PATIENT TYPE:  INP   LOCATION:  4714                                 FACILITY:  [facility]   PHYSICIAN:  CBrita Romp, P.A. [facility]            DATE OF BIRTH:  [birth year]   DATE OF ADMISSION:  08/11/2002  DATE OF DISCHARGE:                                 DISCHARGE SUMMARY   ADDENDUM:  Previous dictation number [id number].                                               Annett Fabian, P.A. [facility]    CKM/MEDQ  D:  08/20/2002  T:  08/23/2002  Job:  [id number]   cc:   Genene Churn. Cyndie Chime, M.D.  [address] [facility]  [place]  [place] [address]  Fax: [phone]

## 2011-04-20 NOTE — Discharge Summary (Signed)
NAME:  ELLSWORTH, [name]                           ACCOUNT NO.:  [id number]   MEDICAL RECORD NO.:  [id number]                   PATIENT TYPE:  INP   LOCATION:  4714                                 FACILITY:  [facility]   PHYSICIAN:  CBrita Romp, P.A. [facility]            DATE OF BIRTH:  02-10-1941   DATE OF ADMISSION:  08/11/2002  DATE OF DISCHARGE:                                 DISCHARGE SUMMARY   DISCHARGE DIAGNOSES:  1. Coronary artery disease status post cardiac catheterization for     percutaneous coronary intervention.  2. Hypercoagulable state, history of deep venous thrombosis/pulmonary     embolism/thrombus.  3. Dyslipidemia.  4. Hypertension.   HOSPITAL COURSE:  [name] is a 70 year old male who was initially seen at  [facility] where he underwent a cardiac exam.  This showed  significant ischemia, and the patient was subsequently transferred to [facility] for further evaluation.   On 09/10, the patient was seen by Dr. Andee Lineman.  The patient's INR was 3.8,  and as a result his Coumadin was held.  The plan was to start heparin when  his INR was below 2.  Dr. Andee Lineman noted that the patient's catheterization  would have to be done on heparin given his hypercoagulable state.   On 09/12, the patient was taken to the cath lab by Dr. Randa Evens.   Catheterization results:  1. Left main coronary artery:  Normal.  2. Left anterior descending colon:  90% stenosis just after the first     diagonal followed by a 60% stenosis.  There was also a 50% osteal lesion     in the first diagonal.  3. Left circumflex:  30% lesion in the mid vessel.  4. Right coronary artery:  Chronically totally occluded; no faint bridging     collaterals proximally and distally there was a left to PLV collaterals.  5. Left ventriculogram:  Ejection fraction 46%, posterior basal akinesis.  6. PCI:  Dr. Samule Ohm performed angioplasty extending to the lesion, 90% left     anterior descending  coronary artery.  He elected to use an express tube     stent rather than a cipher because of the patient's need for long-term     Coumadin therapy.  There is no residual stenosis in the vessel and TIMI-     III flow resulted.  The patient was restarted on his Coumadin the evening     after catheterization.   Over the next few days, the patient did well.  No chest pain or shortness of  breath.  He was kept on heparin as his INR was still subtherapeutic.  On  09/15, the patient was seen by Dr. Samule Ohm.  He noted some episodes of  accelerated ventricular rhythm.  However given the patient's lack of syncope  and ejection fraction of 46%, we continued beta blocker.   On 09/16, the  patient underwent an ultrasound of the right groin which  showed no pseudoaneurysm or AV fistula.   On 09/18, the patient was seen by Dr. Andee Lineman.  The patient had no chest  pain, shortness of breath, or nausea and vomiting, and Dr. Andee Lineman felt the  patient was ready for discharge and close followup.   DISCHARGE MEDICATIONS:  1. Enteric-coated aspirin 81 mg q.d.  2. Plavix 75 mg q.d. until gone (30 days).  3. Atenolol 50 mg q.d.  4. Diovan 320 mg q.d.  5. Zantac 150 mg b.i.d.  6. Imdur 60 mg q.d.  7. Coumadin 5 mg q.d.  8. Niaspan 500 mg q.h.s.  9. Actigall as previously taken.   LABORATORY DATA:  White count 4.9, hemoglobin 11.0, hematocrit 32.9,  platelets 150,000, INR 2.1, sodium 145, potassium 4.4, chloride 109, C02 29,  BUN 19, creatinine 1.4, glucose 100, total cholesterol 158, triglycerides  141, HDL 38, LDL 92, VLDL 28, total cholesterol to HDL ratio 4.2.   Electrocardiogram shows a sinus bradycardia of 55.  PR interval 154, QRS  102, QTC 428, axis 6.   DISCHARGE INSTRUCTIONS:  The patient is to return to his normal level of  activity.  He is to follow a low-fat, low-cholesterol diet.  He is to have a  Coumadin check done on 09/19 at 9 in the morning at the [facility] in  [place].  He is to  followup with a PA visit in the [place] office on 09/25 at 2:45  in the afternoon.  Next, he is to followup with Dr. Eliberto Ivory as needed or  scheduled.                                               Annett Fabian, P.A. [facility]    CKM/MEDQ  D:  08/20/2002  T:  08/23/2002  Job:  [id number]   cc:   Weyman Pedro, M.D.  [facility]

## 2011-04-20 NOTE — Cardiovascular Report (Signed)
NAME:  [name], [name]                           ACCOUNT NO.:  [id number]   MEDICAL RECORD NO.:  [id number]                   PATIENT TYPE:  INP   LOCATION:  4712                                 FACILITY:  [facility]   PHYSICIAN:  Salvadore Farber, M.D. [facility]         DATE OF BIRTH:  1941-10-23   DATE OF PROCEDURE:  09/28/2002  DATE OF DISCHARGE:                              CARDIAC CATHETERIZATION   PROCEDURES:  1. Left heart catheterization.  2. Left ventriculography.  3. Coronary angiography.   CARDIOLOGIST:  Salvadore Farber, M.D.   INDICATIONS:  Mr. Mccombie is a 70 year old gentleman who underwent stenting of  his mid LAD on August 14, 2002.  Initially did well post procedure but  represented on September 23, 2002, one week after the cessation of Plavix,  with anterior myocardial infarction.  CK peaked at 2500 and he developed  anterior T-waves.  On arriving to the hospital, he had already developed Q-  waves anteriorly and was pain-free.  Therefore, redo catheterization was  deferred until his INR was less than 1.6.  He is referred for diagnostic  angiography today.   DIAGNOSTIC TECHNIQUE:  Under 1% lidocaine local anesthesia, a 6 French  sheath was placed in the right femoral artery using modified Seldinger  technique.  JL4, JR4 and 6 French pigtail catheters were used to perform  angiography and ventriculography.  The patient tolerated the procedure well  and was transferred to the holding room in stable condition.  She  ______there.   COMPLICATIONS:  None.   FINDINGS:  1. Left main coronary artery:  Angiographically normal.  2. Left anterior descending:  The previously placed stent in the mid LAD is     widely patent.  There is TIMI-3 flow to the distal vessel.  There is a     40% stenosis in the mid LAD just to the stent.  3. Circumflex:  The circumflex has mild luminal irregularities.  The distal     circumflex provides robust collaterals to the distal RCA.  4. Right  coronary artery:  The right coronary artery is proximally occluded.     The distal artery is collateralized primarily from the circumflex.  5. Left ventricle:  EF is 23% with posterior basal and anterolateral     akinesis.  No aortic stenosis.  1+ mitral regurgitation.   IMPRESSION/PLAN:  The patient has clearly suffered an anterior myocardial  infarction.  However, today's films would suggest spontaneous reperfusion  with no residual stenosis.  Given his long-standing history of  hypercoagulability with multiple arterial and venous thrombotic events and  the occurrence of this event one week after cessation of his Plavix, it  seems most likely that he has transiently thrombosed his stent and  subsequently recanalized it.  He now has severe impaired LV systolic  function.  We will begin angiotension receptor inhibitor given his allergy  to Lisinopril (hives).  Given his multiple  thrombotic events we will plan on  bridging Coumadin to  heparin.  His Integrelin will be discontinued.  Heparin will be begun four  hours after the sheath is removed.  Strong consideration will need to given  to long-term therapy with aspirin, Plavix and Coumadin with the risks of  potential bleeding weighed against risks of further thrombotic events.                                               Salvadore Farber, M.D. [facility]    WED/MEDQ  D:  09/28/2002  T:  09/29/2002  Job:  [id number]   cc:   Learta Codding, M.D. [facility]

## 2011-05-10 ENCOUNTER — Encounter: Payer: Medicare Other | Admitting: *Deleted

## 2011-05-15 ENCOUNTER — Encounter: Payer: Self-pay | Admitting: *Deleted

## 2011-05-15 ENCOUNTER — Ambulatory Visit (INDEPENDENT_AMBULATORY_CARE_PROVIDER_SITE_OTHER): Payer: Medicare Other | Admitting: *Deleted

## 2011-05-15 DIAGNOSIS — K55059 Acute (reversible) ischemia of intestine, part and extent unspecified: Secondary | ICD-10-CM

## 2011-05-15 DIAGNOSIS — Z86718 Personal history of other venous thrombosis and embolism: Secondary | ICD-10-CM

## 2011-05-15 DIAGNOSIS — Z7901 Long term (current) use of anticoagulants: Secondary | ICD-10-CM

## 2011-05-15 DIAGNOSIS — I82409 Acute embolism and thrombosis of unspecified deep veins of unspecified lower extremity: Secondary | ICD-10-CM

## 2011-05-15 DIAGNOSIS — I4891 Unspecified atrial fibrillation: Secondary | ICD-10-CM

## 2011-05-15 LAB — POCT INR: INR: 2.5

## 2011-05-17 ENCOUNTER — Ambulatory Visit (INDEPENDENT_AMBULATORY_CARE_PROVIDER_SITE_OTHER): Payer: Medicare Other | Admitting: *Deleted

## 2011-05-17 DIAGNOSIS — I5022 Chronic systolic (congestive) heart failure: Secondary | ICD-10-CM

## 2011-05-17 DIAGNOSIS — I428 Other cardiomyopathies: Secondary | ICD-10-CM

## 2011-05-17 DIAGNOSIS — I469 Cardiac arrest, cause unspecified: Secondary | ICD-10-CM

## 2011-05-21 NOTE — Progress Notes (Signed)
icd remote check  

## 2011-05-24 ENCOUNTER — Encounter: Payer: Self-pay | Admitting: *Deleted

## 2011-06-12 ENCOUNTER — Encounter: Payer: Medicare Other | Admitting: *Deleted

## 2011-06-19 ENCOUNTER — Encounter: Payer: Medicare Other | Admitting: *Deleted

## 2011-06-19 ENCOUNTER — Telehealth: Payer: Self-pay | Admitting: *Deleted

## 2011-06-19 NOTE — Telephone Encounter (Signed)
Wife called to reschedule pt's coumadin appt today.  Pt has gout in both knees and is in too much pain.  Pt will be starting prednisone 20mg  qd until symptoms are better.  Pt to decrease coumadin to 2.5mg  daily and recheck INR on 06/22/11.  Wife verbalized understanding.

## 2011-06-22 ENCOUNTER — Ambulatory Visit (INDEPENDENT_AMBULATORY_CARE_PROVIDER_SITE_OTHER): Payer: Medicare Other | Admitting: *Deleted

## 2011-06-22 DIAGNOSIS — Z7901 Long term (current) use of anticoagulants: Secondary | ICD-10-CM

## 2011-06-22 DIAGNOSIS — I4891 Unspecified atrial fibrillation: Secondary | ICD-10-CM

## 2011-06-22 DIAGNOSIS — Z86718 Personal history of other venous thrombosis and embolism: Secondary | ICD-10-CM

## 2011-06-22 DIAGNOSIS — K55059 Acute (reversible) ischemia of intestine, part and extent unspecified: Secondary | ICD-10-CM

## 2011-06-22 DIAGNOSIS — I82409 Acute embolism and thrombosis of unspecified deep veins of unspecified lower extremity: Secondary | ICD-10-CM

## 2011-06-24 ENCOUNTER — Other Ambulatory Visit: Payer: Self-pay | Admitting: Cardiology

## 2011-07-20 ENCOUNTER — Encounter: Payer: Medicare Other | Admitting: *Deleted

## 2011-07-23 ENCOUNTER — Encounter: Payer: Self-pay | Admitting: *Deleted

## 2011-07-24 ENCOUNTER — Ambulatory Visit (INDEPENDENT_AMBULATORY_CARE_PROVIDER_SITE_OTHER): Payer: Medicare Other | Admitting: *Deleted

## 2011-07-24 DIAGNOSIS — I82409 Acute embolism and thrombosis of unspecified deep veins of unspecified lower extremity: Secondary | ICD-10-CM

## 2011-07-24 DIAGNOSIS — Z7901 Long term (current) use of anticoagulants: Secondary | ICD-10-CM

## 2011-07-24 DIAGNOSIS — K55059 Acute (reversible) ischemia of intestine, part and extent unspecified: Secondary | ICD-10-CM

## 2011-07-24 DIAGNOSIS — Z86718 Personal history of other venous thrombosis and embolism: Secondary | ICD-10-CM

## 2011-07-24 DIAGNOSIS — I4891 Unspecified atrial fibrillation: Secondary | ICD-10-CM

## 2011-08-21 ENCOUNTER — Ambulatory Visit (INDEPENDENT_AMBULATORY_CARE_PROVIDER_SITE_OTHER): Payer: Medicare Other | Admitting: *Deleted

## 2011-08-21 DIAGNOSIS — I4891 Unspecified atrial fibrillation: Secondary | ICD-10-CM

## 2011-08-21 DIAGNOSIS — Z7901 Long term (current) use of anticoagulants: Secondary | ICD-10-CM

## 2011-08-21 DIAGNOSIS — Z86718 Personal history of other venous thrombosis and embolism: Secondary | ICD-10-CM

## 2011-08-21 DIAGNOSIS — I82409 Acute embolism and thrombosis of unspecified deep veins of unspecified lower extremity: Secondary | ICD-10-CM

## 2011-08-21 DIAGNOSIS — K55059 Acute (reversible) ischemia of intestine, part and extent unspecified: Secondary | ICD-10-CM

## 2011-08-22 ENCOUNTER — Encounter: Payer: Self-pay | Admitting: Cardiology

## 2011-08-23 ENCOUNTER — Ambulatory Visit: Payer: Medicare Other | Admitting: Cardiology

## 2011-08-23 ENCOUNTER — Encounter: Payer: Self-pay | Admitting: Internal Medicine

## 2011-08-23 ENCOUNTER — Other Ambulatory Visit: Payer: Self-pay

## 2011-08-23 ENCOUNTER — Ambulatory Visit (INDEPENDENT_AMBULATORY_CARE_PROVIDER_SITE_OTHER): Payer: Medicare Other | Admitting: *Deleted

## 2011-08-23 ENCOUNTER — Encounter: Payer: Self-pay | Admitting: Cardiology

## 2011-08-23 DIAGNOSIS — I509 Heart failure, unspecified: Secondary | ICD-10-CM

## 2011-08-23 DIAGNOSIS — I428 Other cardiomyopathies: Secondary | ICD-10-CM

## 2011-08-26 LAB — REMOTE ICD DEVICE
AL AMPLITUDE: 0.4 mv
AL THRESHOLD: 0.75 V
FVT: 0
LV LEAD THRESHOLD: 1.75 V
PACEART VT: 0
RV LEAD AMPLITUDE: 13.6 mv
RV LEAD IMPEDENCE ICD: 456 Ohm
RV LEAD THRESHOLD: 0.5 V
TZAT-0001ATACH: 1
TZAT-0001ATACH: 3
TZAT-0002ATACH: NEGATIVE
TZAT-0002ATACH: NEGATIVE
TZAT-0011SLOWVT: 10 ms
TZAT-0012ATACH: 150 ms
TZAT-0012FASTVT: 200 ms
TZAT-0018ATACH: NEGATIVE
TZAT-0018ATACH: NEGATIVE
TZAT-0018FASTVT: NEGATIVE
TZAT-0019ATACH: 6 V
TZAT-0019ATACH: 6 V
TZAT-0019FASTVT: 8 V
TZAT-0019SLOWVT: 8 V
TZAT-0020FASTVT: 1.5 ms
TZAT-0020SLOWVT: 1.5 ms
TZST-0001ATACH: 4
TZST-0001ATACH: 5
TZST-0001ATACH: 6
TZST-0001FASTVT: 5
TZST-0001FASTVT: 6
TZST-0001SLOWVT: 3
TZST-0001SLOWVT: 4
TZST-0001SLOWVT: 6
TZST-0002ATACH: NEGATIVE
TZST-0002ATACH: NEGATIVE
TZST-0002FASTVT: NEGATIVE
TZST-0002FASTVT: NEGATIVE
TZST-0003SLOWVT: 35 J
TZST-0003SLOWVT: 35 J
TZST-0003SLOWVT: 35 J
VF: 0

## 2011-08-28 ENCOUNTER — Ambulatory Visit (INDEPENDENT_AMBULATORY_CARE_PROVIDER_SITE_OTHER): Payer: Medicare Other | Admitting: Cardiology

## 2011-08-28 ENCOUNTER — Encounter: Payer: Self-pay | Admitting: Cardiology

## 2011-08-28 VITALS — BP 135/82 | HR 84 | Ht 70.0 in | Wt 227.0 lb

## 2011-08-28 DIAGNOSIS — R609 Edema, unspecified: Secondary | ICD-10-CM | POA: Insufficient documentation

## 2011-08-28 DIAGNOSIS — R0602 Shortness of breath: Secondary | ICD-10-CM

## 2011-08-28 DIAGNOSIS — G629 Polyneuropathy, unspecified: Secondary | ICD-10-CM

## 2011-08-28 DIAGNOSIS — I469 Cardiac arrest, cause unspecified: Secondary | ICD-10-CM

## 2011-08-28 DIAGNOSIS — Z7901 Long term (current) use of anticoagulants: Secondary | ICD-10-CM

## 2011-08-28 DIAGNOSIS — I2510 Atherosclerotic heart disease of native coronary artery without angina pectoris: Secondary | ICD-10-CM

## 2011-08-28 DIAGNOSIS — I259 Chronic ischemic heart disease, unspecified: Secondary | ICD-10-CM

## 2011-08-28 DIAGNOSIS — I4891 Unspecified atrial fibrillation: Secondary | ICD-10-CM

## 2011-08-28 MED ORDER — CARVEDILOL 6.25 MG PO TABS: ORAL_TABLET | ORAL | Status: DC

## 2011-08-28 NOTE — Assessment & Plan Note (Signed)
Dependent edema. No definite evidence of volume overload and I will not increase his diuretic given his renal insufficiency.

## 2011-08-28 NOTE — Assessment & Plan Note (Addendum)
LV function as low as 20-25% at time of cardiac arrest. Prior ejection fraction 38% by nuclear perfusion study. We'll proceed with followup echocardiogram in 3 months. Increase beta blocker, i.e. carvedilol to 9. 375 mg by mouth twice a day. Patient not a candidate for ACE inhibitor secondary to his renal insufficiency.

## 2011-08-28 NOTE — Assessment & Plan Note (Signed)
Recurrent cough of yellow phlegm. I asked him to followup with his primary care physician regarding this to rule out that he does not have chronic bronchitis.

## 2011-08-28 NOTE — Progress Notes (Signed)
HPI The patient is a 70 year old male with history of ischemic cardio myopathy, status post cardiac arrest and status post CRT-D implantation. The patient has underlying chronic renal insufficiency. Is on beta blocker therapy but currently not aspirin. He also has hypercoagulable state and is on chronic Coumadin therapy. He has a prior history of predicted coronary intervention as well as in-stent thrombosis in the absence of Plavix. The patient presents today for followup. He reports numbness and tingling in both lower extremities from the mid thigh down to the feet. He also has some lower extremity edema which is chronic. He still reports a cough with yellow phlegm. This is now chronic. He denies any chest pain shortness of breath orthopnea or PND. He reports no defibrillator discharges.  Allergies  Allergen Reactions  . Cefuroxime   . Sulfonamide Derivatives     Current Outpatient Prescriptions on File Prior to Visit  Medication Sig Dispense Refill  . allopurinol (ZYLOPRIM) 300 MG tablet Take 300 mg by mouth daily.        [name] aspirin 81 MG tablet Take 81 mg by mouth daily.        . bumetanide (BUMEX) 1 MG tablet Take 1 mg by mouth daily.        [name] dextromethorphan-guaiFENesin (MUCINEX DM) 30-600 MG per 12 hr tablet Take 1 tablet by mouth 2 (two) times daily as needed.       . diphenhydrAMINE (SOMINEX) 25 MG tablet Take 25 mg by mouth as needed.        . gabapentin (NEURONTIN) 300 MG capsule Take 300 mg by mouth 2 (two) times daily.        [name] ipratropium-albuterol (DUONEB) 0.5-2.5 (3) MG/3ML SOLN Take 3 mLs by nebulization.        [name] LORazepam (ATIVAN) 1 MG tablet Take 1 mg by mouth at bedtime.       [name] oxyCODONE-acetaminophen (PERCOCET) 5-325 MG per tablet Take 1 tablet by mouth every 4 (four) hours as needed.        . pravastatin (PRAVACHOL) 40 MG tablet Take 40 mg by mouth daily.        . predniSONE (DELTASONE) 20 MG tablet Take 20 mg by mouth as directed.        . ranitidine (ZANTAC) 150 MG  capsule Take 150 mg by mouth 2 (two) times daily.        . Sulfacetamide Sodium-Sulfur (SUPHERA) 10-5 % CREA Apply topically daily.        [name] warfarin (COUMADIN) 5 MG tablet Take 5 mg by mouth as directed.         Past Medical History  Diagnosis Date  . Diabetes mellitus   . 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 Gout   . CAD (coronary artery disease)   . Gout   . Hypotension     ORTHOSTATIC    Past Surgical History  Procedure Date  . Dvt   . Biv     ICD 12/11 MEDTRONIC    No family history on file.  History   Social History  . Marital Status: Married    Spouse Name: N/A    Number of Children: N/A  . Years of Education: N/A   Occupational History  . Not on file.   Social History Main Topics  . Smoking status: Former Smoker -- 1.0 packs/day for 30 years    Types: Cigarettes    Quit date: 12/03/1986  . Smokeless tobacco: Never Used  .  Alcohol Use: No  . Drug Use: Not on file  . Sexually Active: Not on file   Other Topics Concern  . Not on file   Social History Narrative  . No narrative on file   [id number] positives as outlined above. The remainder of the 18  point review of systems is negativ  PHYSICAL EXAM BP 135/82  Pulse 84  Ht 5\' 10"  (1.778 m)  Wt 227 lb (102.967 kg)  BMI 32.57 kg/m2 General: Well-developed, well-nourished in no distress Head: Normocephalic and atraumatic Eyes:PERRLA/EOMI intact, conjunctiva and lids normal Ears: No deformity or lesions Mouth:normal dentition, normal posterior pharynx Neck: Supple, no JVD.  No masses, thyromegaly or abnormal cervical nodes Lungs: Normal breath sounds bilaterally without wheezing.  Normal percussion Cardiac: regular rate and rhythm with normal S1 and S2, no S3 or S4.  PMI is normal.  No pathological murmurs Abdomen: Normal bowel sounds, abdomen is soft and nontender without masses, organomegaly or hernias noted.  No hepatosplenomegaly MSK: Back normal, normal gait muscle strength and tone  normal, patient walks with a cane. Vascular: Pulse is normal in all 4 extremities Extremities: 2+ peripheral pitting edema bilaterally  Neurologic: Alert and oriented x 3 Skin: Intact without lesions or rashes Lymphatics: No significant adenopathy Psychologic: Normal affect   ECG: Not available  ASSESSMENT AND PLAN

## 2011-08-28 NOTE — Assessment & Plan Note (Signed)
Continue chronic Coumadin therapy for hypocoagulable state and chronic atrial fibrillation as well as a prior history of pulmonary embolism.

## 2011-08-28 NOTE — Assessment & Plan Note (Signed)
Status post on cardiac death, status post CRT-D implantation. No defibrillator discharges

## 2011-08-28 NOTE — Assessment & Plan Note (Signed)
Status post prior stent placement. PCI of the RCA stenting of the mid LAD with bare-metal stent in 2003. Status post prior in-stent thrombosis. History of hypercoagulable state. No recurrent chest pain. Cardiolite stress study earlier this year no viability inferior and inferoseptal walls. Fixed defects no ischemia

## 2011-08-28 NOTE — Assessment & Plan Note (Signed)
Patient is in chronic atrial fibrillation. Rate control. Continue medical therapy

## 2011-08-28 NOTE — Patient Instructions (Signed)
   Increase Carvedilol to 9.75mg  twice a day  Echo in 3 months - just before next visit Follow up in  3 months - see above

## 2011-08-28 NOTE — Assessment & Plan Note (Signed)
Patient reports burning in both lower extremities. Suspect peripheral polyneuropathy. I asked him to followup with his primary care physician. No evidence of vascular insufficiency and no definite indication to obtain arterial Dopplers.

## 2011-08-31 NOTE — Progress Notes (Signed)
ICD/ICM checked by remote. 

## 2011-09-18 ENCOUNTER — Ambulatory Visit (INDEPENDENT_AMBULATORY_CARE_PROVIDER_SITE_OTHER): Payer: Medicare Other | Admitting: *Deleted

## 2011-09-18 DIAGNOSIS — I82409 Acute embolism and thrombosis of unspecified deep veins of unspecified lower extremity: Secondary | ICD-10-CM

## 2011-09-18 DIAGNOSIS — I4891 Unspecified atrial fibrillation: Secondary | ICD-10-CM

## 2011-09-18 DIAGNOSIS — Z86718 Personal history of other venous thrombosis and embolism: Secondary | ICD-10-CM

## 2011-09-18 DIAGNOSIS — K55059 Acute (reversible) ischemia of intestine, part and extent unspecified: Secondary | ICD-10-CM

## 2011-09-18 DIAGNOSIS — Z7901 Long term (current) use of anticoagulants: Secondary | ICD-10-CM

## 2011-10-02 ENCOUNTER — Encounter: Payer: Medicare Other | Admitting: *Deleted

## 2011-10-05 ENCOUNTER — Ambulatory Visit (INDEPENDENT_AMBULATORY_CARE_PROVIDER_SITE_OTHER): Payer: Medicare Other | Admitting: *Deleted

## 2011-10-05 DIAGNOSIS — I4891 Unspecified atrial fibrillation: Secondary | ICD-10-CM

## 2011-10-05 DIAGNOSIS — Z7901 Long term (current) use of anticoagulants: Secondary | ICD-10-CM

## 2011-10-05 DIAGNOSIS — K55059 Acute (reversible) ischemia of intestine, part and extent unspecified: Secondary | ICD-10-CM

## 2011-10-05 DIAGNOSIS — Z86718 Personal history of other venous thrombosis and embolism: Secondary | ICD-10-CM

## 2011-10-05 DIAGNOSIS — I82409 Acute embolism and thrombosis of unspecified deep veins of unspecified lower extremity: Secondary | ICD-10-CM

## 2011-10-17 ENCOUNTER — Other Ambulatory Visit: Payer: Self-pay | Admitting: Cardiology

## 2011-10-17 DIAGNOSIS — I4891 Unspecified atrial fibrillation: Secondary | ICD-10-CM

## 2011-10-17 DIAGNOSIS — I2510 Atherosclerotic heart disease of native coronary artery without angina pectoris: Secondary | ICD-10-CM

## 2011-10-26 ENCOUNTER — Ambulatory Visit (INDEPENDENT_AMBULATORY_CARE_PROVIDER_SITE_OTHER): Payer: Medicare Other | Admitting: *Deleted

## 2011-10-26 DIAGNOSIS — Z86718 Personal history of other venous thrombosis and embolism: Secondary | ICD-10-CM

## 2011-10-26 DIAGNOSIS — I4891 Unspecified atrial fibrillation: Secondary | ICD-10-CM

## 2011-10-26 DIAGNOSIS — K55059 Acute (reversible) ischemia of intestine, part and extent unspecified: Secondary | ICD-10-CM

## 2011-10-26 DIAGNOSIS — Z7901 Long term (current) use of anticoagulants: Secondary | ICD-10-CM

## 2011-10-26 DIAGNOSIS — I82409 Acute embolism and thrombosis of unspecified deep veins of unspecified lower extremity: Secondary | ICD-10-CM

## 2011-11-15 ENCOUNTER — Other Ambulatory Visit (INDEPENDENT_AMBULATORY_CARE_PROVIDER_SITE_OTHER): Payer: Medicare Other | Admitting: *Deleted

## 2011-11-15 DIAGNOSIS — I2510 Atherosclerotic heart disease of native coronary artery without angina pectoris: Secondary | ICD-10-CM

## 2011-11-15 DIAGNOSIS — R0602 Shortness of breath: Secondary | ICD-10-CM

## 2011-11-15 DIAGNOSIS — I4891 Unspecified atrial fibrillation: Secondary | ICD-10-CM

## 2011-11-20 ENCOUNTER — Encounter: Payer: Self-pay | Admitting: *Deleted

## 2011-11-20 ENCOUNTER — Ambulatory Visit (INDEPENDENT_AMBULATORY_CARE_PROVIDER_SITE_OTHER): Payer: Medicare Other | Admitting: Cardiology

## 2011-11-20 ENCOUNTER — Encounter: Payer: Self-pay | Admitting: Cardiology

## 2011-11-20 ENCOUNTER — Ambulatory Visit (INDEPENDENT_AMBULATORY_CARE_PROVIDER_SITE_OTHER): Payer: Medicare Other | Admitting: *Deleted

## 2011-11-20 ENCOUNTER — Telehealth: Payer: Self-pay | Admitting: *Deleted

## 2011-11-20 VITALS — BP 121/79 | HR 82 | Ht 70.0 in | Wt 244.0 lb

## 2011-11-20 DIAGNOSIS — I2510 Atherosclerotic heart disease of native coronary artery without angina pectoris: Secondary | ICD-10-CM

## 2011-11-20 DIAGNOSIS — N289 Disorder of kidney and ureter, unspecified: Secondary | ICD-10-CM | POA: Insufficient documentation

## 2011-11-20 DIAGNOSIS — I4891 Unspecified atrial fibrillation: Secondary | ICD-10-CM

## 2011-11-20 DIAGNOSIS — Z7901 Long term (current) use of anticoagulants: Secondary | ICD-10-CM

## 2011-11-20 DIAGNOSIS — K55059 Acute (reversible) ischemia of intestine, part and extent unspecified: Secondary | ICD-10-CM

## 2011-11-20 DIAGNOSIS — Z86718 Personal history of other venous thrombosis and embolism: Secondary | ICD-10-CM

## 2011-11-20 DIAGNOSIS — I340 Nonrheumatic mitral (valve) insufficiency: Secondary | ICD-10-CM

## 2011-11-20 DIAGNOSIS — R0602 Shortness of breath: Secondary | ICD-10-CM

## 2011-11-20 DIAGNOSIS — I059 Rheumatic mitral valve disease, unspecified: Secondary | ICD-10-CM

## 2011-11-20 DIAGNOSIS — I82409 Acute embolism and thrombosis of unspecified deep veins of unspecified lower extremity: Secondary | ICD-10-CM

## 2011-11-20 DIAGNOSIS — I5023 Acute on chronic systolic (congestive) heart failure: Secondary | ICD-10-CM | POA: Insufficient documentation

## 2011-11-20 LAB — POCT INR: INR: 2.6

## 2011-11-20 MED ORDER — BUMETANIDE 2 MG PO TABS: 2.0000 mg | ORAL_TABLET | Freq: Every day | ORAL | Status: DC

## 2011-11-20 NOTE — Assessment & Plan Note (Signed)
Worsening of creatinine to 1.87 over the last several months. We'll monitor this closely on increase Bumex therapy.

## 2011-11-20 NOTE — Patient Instructions (Signed)
Follow up as scheduled. Bumex 2 mg daily. Your physician recommends that you go to the [facility] for lab work: BMET/BNP-DO IN 1 WEEK Referral to [facility]-Dr. Earnestine Leys will arrange Your physician has requested that you have a TEE. During a TEE, sound waves are used to create images of your heart. It provides your doctor with information about the size and shape of your heart and how well your heart's chambers and valves are working. In this test, a transducer is attached to the end of a flexible tube that's guided down your throat and into your esophagus (the tube leading from you mouth to your stomach) to get a more detailed image of your heart. You are not awake for the procedure. Please see the instruction sheet given to you today. For further information please visit [email or web address]

## 2011-11-20 NOTE — Assessment & Plan Note (Signed)
Patient needs to remain on Coumadin. No bleeding complications.

## 2011-11-20 NOTE — Assessment & Plan Note (Signed)
Patient has marked volume overload with a significant increase in weight. He has symptoms of vascular congestion. I've asked him to increase his Bumex to 2 mg daily. Actually told them despite having already taking Bumex this morning that he should take an additional 2 mg a day. I also suggested for the patient received intravenous Lasix in the short stay unit but he declined this. I've asked to restrict his fluid intake up for his wife to e-mail me on a regular basis next week 2 weeks with his daily weights as in may need further adjustments of his diuretic therapy. The patient has significant LV dysfunction but now also has moderate to severe mitral regurgitation. After diuresis this will require further evaluation and the patient will be scheduled for a transesophageal echocardiogram in 2 weeks.

## 2011-11-20 NOTE — Assessment & Plan Note (Signed)
May be significantly contributing to dyspnea. We'll evaluate with TEE after diuresis the degree and etiology of his mitral regurgitation.

## 2011-11-20 NOTE — Telephone Encounter (Signed)
TEE at [facility] December 04, 2010 Arrive 12:00 [facility]) for 1:00 pm procedure Dr. Earnestine Leys

## 2011-11-20 NOTE — Assessment & Plan Note (Signed)
Significant coronary artery disease but with no ischemia by Cardiolite earlier this year and no viability in the inferior and inferoposterior wall.

## 2011-11-20 NOTE — Telephone Encounter (Signed)
No precert required 

## 2011-11-20 NOTE — Progress Notes (Signed)
Hayden Bottoms, MD, [facility] ABIM Board Certified in Adult Cardiovascular Medicine,Internal Medicine and Critical Care Medicine    CC: Followup patient with ischemic heart disease complaining of shortness of breath  HPI:  The patient is a 70 year old male with a history of chronic renal insufficiency, ischemic cardiomyopathy ejection fraction 35-40%. He status post CRT-D placement after cardiac arrest. Ejection fraction by nuclear imaging more recently is 38% with multiple fixed defects without any viability in the inferior and inferoposterior walls. The patient reports a marked increase in his shortness of breath. He states that he has no energy. He has frequent coughing spells. He has one pillow orthopnea he has no PND mainly because with he sleeps with a CPAP mask at night. He states on the slightest things making short of breath. His in NYHA class III symptoms improved with resting. He also has been taking frequent doses of prednisone for what he presumes is gout. His weight is markedly increased since his last office visit from 227 pounds now up to 244 pounds. He reports significant increase in lower extremity edema. He denies any chest pain. He reports no palpitations or presyncope or syncope.     PMH: reviewed and listed in Problem List in Electronic Records (and see below) Past Medical History  Diagnosis Date  . Diabetes mellitus   . Renal tubular acidosis type II creatinine 1.87 GFR of 36 mL per minute      TYPE 4  . Gout   . CAD (coronary artery disease)   . Gout   . Hypotension     ORTHOSTATIC   Hypercoagulable state on chronic warfarin therapy.    Past Surgical History  Procedure Date  . Dvt   . Biv     ICD 12/11 MEDTRONIC      Allergies/SH/FHX : available in Electronic Records for review and reviewed today.   Medications: Current Outpatient Prescriptions  Medication Sig Dispense Refill  . allopurinol (ZYLOPRIM) 300 MG tablet Take 300 mg by mouth daily.          [name] aspirin 81 MG tablet Take 81 mg by mouth daily.        . carvedilol (COREG) 6.25 MG tablet Take 1 1/2 tabs (9.75mg ) twice a day  270 tablet  3  . dextromethorphan-guaiFENesin (MUCINEX DM) 30-600 MG per 12 hr tablet Take 1 tablet by mouth 2 (two) times daily as needed.       . diphenhydrAMINE (SOMINEX) 25 MG tablet Take 25 mg by mouth as needed.        . ergocalciferol (VITAMIN D2) 50000 UNITS capsule Take 50,000 Units by mouth once a week.        . fish oil-omega-3 fatty acids 1000 MG capsule Take 1 g by mouth daily.        [name] gabapentin (NEURONTIN) 300 MG capsule Take 300 mg by mouth 2 (two) times daily.        [name] ipratropium-albuterol (DUONEB) 0.5-2.5 (3) MG/3ML SOLN Take 3 mLs by nebulization.        [name] LORazepam (ATIVAN) 1 MG tablet Take 1 mg by mouth at bedtime.       [name] oxyCODONE-acetaminophen (PERCOCET) 5-325 MG per tablet Take 1 tablet by mouth every 6 (six) hours as needed.       . pravastatin (PRAVACHOL) 40 MG tablet Take 40 mg by mouth daily.        . predniSONE (DELTASONE) 20 MG tablet Take 20 mg by mouth as directed.        [name]  ranitidine (ZANTAC) 150 MG capsule Take 150 mg by mouth 2 (two) times daily.        . Sulfacetamide Sodium-Sulfur (SUPHERA) 10-5 % CREA Apply topically daily.        [name] warfarin (COUMADIN) 2.5 MG tablet Take 2.5 mg by mouth as directed.        . warfarin (COUMADIN) 5 MG tablet Take 5 mg by mouth as directed.       . bumetanide (BUMEX) 2 MG tablet Take 1 tablet (2 mg total) by mouth daily.  30 tablet  6    ROS: No nausea or vomiting. No fever or chills.No melena or hematochezia.No bleeding.No claudication  Physical Exam: BP 121/79  Pulse 82  Ht 5\' 10"  (1.778 m)  Wt 244 lb (110.678 kg)  BMI 35.01 kg/m2 General: Well-nourished white male walking with a cane. Visibly short of breath on minimal exertion Neck: JVP is 12 cm. No thyromegaly nonnodular thyroid. Normal carotid upstroke and no carotid bruits Lungs: Faint crackles at the bases bilaterally Cardiac:  Regular rate and rhythm with normal S1-S2, S3 present no definite evidence of significant pathological murmurs Vascular: 3+ peripheral pitting edema. Dorsalis pedis and posterior tibial pulses are 1+ bilaterally Skin: Warm and dry  12lead ECG: Limited bedside ECHO:N/A   Assessment and Plan  Counseling was provided regarding the current medical condition and included: . Diagnosis, impressions, prognosis, recommended diagnostic studies  . Risks and benefits of treatment options  . Instructions for management, treatment and/or follow-up care  . Importance of compliance with treatment, risk factor reduction  . Patient and/or family education    Time spent counseling was 45 minutes and recorded in the Problem List.

## 2011-11-20 NOTE — Assessment & Plan Note (Signed)
Chronic and rate control. Heart rate of only 82 beats per minute today. The patient remains on warfarin. Consideration may be given to 48 hour Holter monitor to see what his rates are during activity.

## 2011-11-22 ENCOUNTER — Encounter: Payer: Self-pay | Admitting: Internal Medicine

## 2011-11-22 ENCOUNTER — Telehealth: Payer: Self-pay | Admitting: *Deleted

## 2011-11-22 ENCOUNTER — Other Ambulatory Visit: Payer: Self-pay | Admitting: Internal Medicine

## 2011-11-22 ENCOUNTER — Ambulatory Visit (INDEPENDENT_AMBULATORY_CARE_PROVIDER_SITE_OTHER): Payer: Medicare Other | Admitting: *Deleted

## 2011-11-22 DIAGNOSIS — I469 Cardiac arrest, cause unspecified: Secondary | ICD-10-CM

## 2011-11-22 DIAGNOSIS — I5023 Acute on chronic systolic (congestive) heart failure: Secondary | ICD-10-CM

## 2011-11-22 NOTE — Telephone Encounter (Signed)
Calling to report weight on husband.  Stated that she tried to send you e-mail, but not going thru.  Wanted to thank-you for taking care of Albeiro.    Baseline weight at home:  236.7 on 12/18 and this morning was 230.  Does she need to monitor daily?

## 2011-11-23 ENCOUNTER — Telehealth: Payer: Self-pay | Admitting: *Deleted

## 2011-11-23 NOTE — Telephone Encounter (Signed)
Pt walked into the office stating he would like to know if he can get IV Lasix recommended by Dr. Earnestine Leys at recent office visit. Pt states he changed meds as recommended. He lost 6 lbs the first day and hasn't lost any since. He saw kidney doctor today and was told he might want to proceed with getting fluid removed (Lasix). Pt also states he is having some "stinging" in his mid chest. He is aware to go to ER for evaluation of increased fluid and stinging in his chest. Pt verbalized understanding.

## 2011-11-27 NOTE — Telephone Encounter (Signed)
Yes should wget weight daily- if less then 225- will need BMET/BNP and see how much more we need to diurese.

## 2011-11-28 NOTE — Telephone Encounter (Signed)
Left message to return call 

## 2011-11-29 LAB — REMOTE ICD DEVICE
AL IMPEDENCE ICD: 456 Ohm
AL THRESHOLD: 0.75 V
BATTERY VOLTAGE: 3.1214 V
LV LEAD IMPEDENCE ICD: 760 Ohm
RV LEAD AMPLITUDE: 12.5 mv
TOT-0002: 0
TOT-0006: 20111227000000
TZAT-0001FASTVT: 1
TZAT-0001SLOWVT: 1
TZAT-0002ATACH: NEGATIVE
TZAT-0002ATACH: NEGATIVE
TZAT-0002FASTVT: NEGATIVE
TZAT-0004SLOWVT: 8
TZAT-0012ATACH: 150 ms
TZAT-0012ATACH: 150 ms
TZAT-0012FASTVT: 200 ms
TZAT-0013SLOWVT: 3
TZAT-0018FASTVT: NEGATIVE
TZAT-0019ATACH: 6 V
TZAT-0019ATACH: 6 V
TZAT-0019ATACH: 6 V
TZAT-0019SLOWVT: 8 V
TZAT-0020ATACH: 1.5 ms
TZAT-0020ATACH: 1.5 ms
TZAT-0020ATACH: 1.5 ms
TZAT-0020SLOWVT: 1.5 ms
TZON-0003ATACH: 350 ms
TZON-0004SLOWVT: 20
TZON-0005SLOWVT: 12
TZST-0001ATACH: 4
TZST-0001FASTVT: 4
TZST-0001FASTVT: 6
TZST-0001SLOWVT: 2
TZST-0001SLOWVT: 4
TZST-0002ATACH: NEGATIVE
TZST-0002FASTVT: NEGATIVE
TZST-0002FASTVT: NEGATIVE
TZST-0002FASTVT: NEGATIVE
TZST-0003SLOWVT: 35 J
TZST-0003SLOWVT: 35 J
TZST-0003SLOWVT: 35 J

## 2011-11-29 NOTE — Telephone Encounter (Signed)
Patient left message on voice mail this morning returning call.  Returned call & answering machine picked up again.  Left information below on machine.

## 2011-11-30 NOTE — Progress Notes (Signed)
ICD remote with ICM 

## 2011-12-03 ENCOUNTER — Other Ambulatory Visit: Payer: Self-pay | Admitting: *Deleted

## 2011-12-03 DIAGNOSIS — I5023 Acute on chronic systolic (congestive) heart failure: Secondary | ICD-10-CM

## 2011-12-03 DIAGNOSIS — R0602 Shortness of breath: Secondary | ICD-10-CM

## 2011-12-05 ENCOUNTER — Encounter: Payer: Self-pay | Admitting: *Deleted

## 2011-12-05 DIAGNOSIS — I059 Rheumatic mitral valve disease, unspecified: Secondary | ICD-10-CM

## 2011-12-12 ENCOUNTER — Other Ambulatory Visit: Payer: Self-pay | Admitting: Cardiology

## 2011-12-12 ENCOUNTER — Encounter (HOSPITAL_COMMUNITY): Payer: Self-pay | Admitting: Respiratory Therapy

## 2011-12-12 DIAGNOSIS — I509 Heart failure, unspecified: Secondary | ICD-10-CM

## 2011-12-12 DIAGNOSIS — I340 Nonrheumatic mitral (valve) insufficiency: Secondary | ICD-10-CM

## 2011-12-12 NOTE — H&P (Signed)
 Guy De Gent, MD, FACC ABIM Board Certified in Adult Cardiovascular Medicine,Internal Medicine and Critical Care Medicine    CC:ADMISSION FOR RIGHT HEART CATHETERIZATION AND INTRAVENOUS MILRINONE  HPI:  The patient is a 71-year-old male with a history of chronic renal insufficiency, ischemic cardiomyopathy ejection fraction 35-40%. He status post CRT-D placement after cardiac arrest. Ejection fraction by nuclear imaging more recently is 38% with multiple fixed defects without any viability in the inferior and inferoposterior walls.  The patient reports a marked increase in his shortness of breath. He states that he has no energy. He has frequent coughing spells. He has one pillow orthopnea he has no PND mainly because with he sleeps with a CPAP mask at night. He states on the slightest things making short of breath. His in NYHA class III symptoms improved with resting.  He also has been taking frequent doses of prednisone for what he presumes is gout.  His weight is markedly increased since his last office visit from 227 pounds now up to 244 pounds. He reports significant increase in lower extremity edema.  He denies any chest pain. He reports no palpitations or presyncope or syncope. The patient had a TEE last week which showed that he had severe LV systolic dysfunction, ejection fraction approximately 20% and moderate to severe mitral regurgitation secondary to a dilated left ventricle with poor coaptation of the mitral valve leaflets. The patient's functional status has worsened and after discussion with the advanced heart failure team who felt the patient would benefit from an admission and obtain a right heart catheterization to obtain hemodynamics.  Subsequently, he would receive intravenous milrinone in the hope to improve his functional status.   PMH: reviewed and listed in Problem List in Electronic Records (and see below) Past Medical History  Diagnosis Date  . Diabetes mellitus     . 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 Gout   . CAD (coronary artery disease)   . Gout   . Hypotension     ORTHOSTATIC   Past Surgical History  Procedure Date  . Dvt   . Biv     ICD 12/11 MEDTRONIC    Allergies/SH/FHX : available in Electronic Records for review  Allergies  Allergen Reactions  . Cefuroxime   . Sulfonamide Derivatives    History   Social History  . Marital Status: Married    Spouse Name: N/A    Number of Children: N/A  . Years of Education: N/A   Occupational History  . Not on file.   Social History Main Topics  . Smoking status: Former Smoker -- 1.0 packs/day for 30 years    Types: Cigarettes    Quit date: 12/03/1986  . Smokeless tobacco: Never Used  . Alcohol Use: No  . Drug Use: Not on file  . Sexually Active: Not on file   Other Topics Concern  . Not on file   Social History Narrative  . No narrative on file   No family history on file.  Medications: Current Outpatient Prescriptions  Medication Sig Dispense Refill  . allopurinol (ZYLOPRIM) 300 MG tablet Take 300 mg by mouth daily.        . aspirin 81 MG tablet Take 81 mg by mouth daily.        . bumetanide (BUMEX) 2 MG tablet Take 2 mg by mouth daily.      . carvedilol (COREG) 6.25 MG tablet Take 9.375 mg by mouth 2 (two) times   daily with a meal.      . dextromethorphan-guaiFENesin (MUCINEX DM) 30-600 MG per 12 hr tablet Take 1 tablet by mouth 2 (two) times daily as needed. For cough      . diphenhydrAMINE (SOMINEX) 25 MG tablet Take 25 mg by mouth as needed.        . ergocalciferol (VITAMIN D2) 50000 UNITS capsule Take 50,000 Units by mouth once a week.        . fish oil-omega-3 fatty acids 1000 MG capsule Take 1 g by mouth daily.        . gabapentin (NEURONTIN) 300 MG capsule Take 300 mg by mouth 2 (two) times daily.        . LORazepam (ATIVAN) 1 MG tablet Take 1 mg by mouth at bedtime.       . pravastatin (PRAVACHOL) 40 MG tablet Take 40 mg by mouth daily.        .  predniSONE (DELTASONE) 20 MG tablet Take 20 mg by mouth as directed. Take for gout flare ups      . ranitidine (ZANTAC) 150 MG capsule Take 150 mg by mouth 2 (two) times daily.        . Sulfacetamide Sodium-Sulfur (SUPHERA) 10-5 % CREA Apply 1 application topically daily.         ROS: No nausea or vomiting. No fever or chills.No melena or hematochezia.No bleeding.No claudication  Physical Exam: There were no vitals taken for this visit.vitals will be taken on admission.  Vitals were taken during the last office visit .  Used to last office visit as the present history and physical. Refer for physical examination to recent office note. The patient and his family were counseled for approximately 45 min. On Monday prior to this admission to discuss the issues around right heart catheterization, intravenous milrinone and possible evaluation for a future left ventricular assistive device.  12lead ECG: Limited bedside ECHO:N/A Counseling was provided regarding the current medical condition and included: . Diagnosis, impressions, prognosis, recommended diagnostic studies  . Risks and benefits of treatment options  . Instructions for management, treatment and/or follow-up care  . Importance of compliance with treatment, risk factor reduction  . Patient and/or family education    Time spent counseling was 45 minutes and recorded in the Problem List.   Patient Active Problem List  Diagnoses  . DM  . HYPERLIPIDEMIA-MIXED  . OBSTRUCTIVE SLEEP APNEA  . CAD, NATIVE VESSEL  . CORONARY ARTERY DISEASE, S/P PTCA  . ATRIAL FIBRILLATION  . LEFT VENTRICULAR FUNCTION, DECREASED-minimal viability with results as outlined above  . DVT-warfarin therapy  . HYPOTENSION, ORTHOSTATIC  . PULMONARY NODULE  . 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 OTH SPEC D/O RESULT FROM IMPAIRED RENAL FUNCTION  . PULMONARY EMBOLISM, HX OF  . CHRONIC SYSTOLIC HEART FAILURE-with worsening ejection  fraction approximately 20% by recent TEE  . SHORTNESS OF BREATH  . POSTSURG PERCUT TRANSLUMINAL COR ANGPLSTY STS  . CARDIAC ARRESTstatus post ICD  . Encounter for long-term (current) use of anticoagulants  . Edema  . Peripheral neuropathy  . Acute on chronic systolic heart failure-NYHA class IIIB  . Mitral regurgitation-moderate to severe  . Renal insufficiency    PLAN   The patient will be admitted on 12/13/2011, and will be kept n.p.o. After midnight.    The plan is to proceed with a right heart catheterizationOn 12/14/2011, with Dr. Stuckey in the morning.  Coumadin was held prior to admission, but patient does not need a   left heart catheterization.  He has little viable tissue as assessed by a prior nuclear perfusion study.  Right heart catheterization can be performed but a slightly elevated PT INR.  After right heart catheterization if the INR is less than 2 patient will need to be on full dose intravenous heparin and heparin subcutaneous for DVT prophylaxis can be discontinued.  Warfarin can be resumed with overlap of heparin until PT INR is between 2 and 3  Treatment will be guided in hospital IV advanced heart failure team and the plan is to start milrinone after right heart catheterization.  Decision will be made by the advanced heart failure team.  The patient will benefit from intermittent milrinone or hold IV milrinone.  An evaluation for left ventricular assist device will also be made if felt to be appropriate.        

## 2011-12-13 ENCOUNTER — Inpatient Hospital Stay (HOSPITAL_COMMUNITY)
Admission: AD | Admit: 2011-12-13 | Discharge: 2011-12-18 | DRG: 287 | Disposition: A | Discharge status: Home / Self Care | Payer: Medicare Other | Source: Ambulatory Visit | Attending: Cardiology | Admitting: Cardiology

## 2011-12-13 ENCOUNTER — Encounter (HOSPITAL_COMMUNITY)
Admission: AD | Disposition: A | Discharge status: Home / Self Care | Payer: Self-pay | Source: Ambulatory Visit | Attending: Cardiology

## 2011-12-13 ENCOUNTER — Other Ambulatory Visit: Payer: Self-pay

## 2011-12-13 ENCOUNTER — Encounter (HOSPITAL_COMMUNITY): Payer: Self-pay | Admitting: General Practice

## 2011-12-13 ENCOUNTER — Ambulatory Visit (HOSPITAL_COMMUNITY): Admission: RE | Admit: 2011-12-13 | Payer: Medicare Other | Source: Ambulatory Visit | Admitting: Cardiology

## 2011-12-13 DIAGNOSIS — N184 Chronic kidney disease, stage 4 (severe): Secondary | ICD-10-CM | POA: Diagnosis present

## 2011-12-13 DIAGNOSIS — Z7901 Long term (current) use of anticoagulants: Secondary | ICD-10-CM

## 2011-12-13 DIAGNOSIS — Z87891 Personal history of nicotine dependence: Secondary | ICD-10-CM

## 2011-12-13 DIAGNOSIS — G609 Hereditary and idiopathic neuropathy, unspecified: Secondary | ICD-10-CM | POA: Diagnosis present

## 2011-12-13 DIAGNOSIS — R911 Solitary pulmonary nodule: Secondary | ICD-10-CM | POA: Diagnosis present

## 2011-12-13 DIAGNOSIS — I2789 Other specified pulmonary heart diseases: Secondary | ICD-10-CM | POA: Diagnosis present

## 2011-12-13 DIAGNOSIS — N289 Disorder of kidney and ureter, unspecified: Secondary | ICD-10-CM

## 2011-12-13 DIAGNOSIS — E785 Hyperlipidemia, unspecified: Secondary | ICD-10-CM | POA: Diagnosis present

## 2011-12-13 DIAGNOSIS — I4729 Other ventricular tachycardia: Secondary | ICD-10-CM | POA: Diagnosis not present

## 2011-12-13 DIAGNOSIS — I5022 Chronic systolic (congestive) heart failure: Secondary | ICD-10-CM

## 2011-12-13 DIAGNOSIS — I2589 Other forms of chronic ischemic heart disease: Secondary | ICD-10-CM | POA: Diagnosis present

## 2011-12-13 DIAGNOSIS — Z9861 Coronary angioplasty status: Secondary | ICD-10-CM

## 2011-12-13 DIAGNOSIS — I4891 Unspecified atrial fibrillation: Secondary | ICD-10-CM

## 2011-12-13 DIAGNOSIS — G4733 Obstructive sleep apnea (adult) (pediatric): Secondary | ICD-10-CM | POA: Diagnosis present

## 2011-12-13 DIAGNOSIS — M109 Gout, unspecified: Secondary | ICD-10-CM | POA: Diagnosis present

## 2011-12-13 DIAGNOSIS — Z8674 Personal history of sudden cardiac arrest: Secondary | ICD-10-CM

## 2011-12-13 DIAGNOSIS — IMO0002 Reserved for concepts with insufficient information to code with codable children: Secondary | ICD-10-CM

## 2011-12-13 DIAGNOSIS — E119 Type 2 diabetes mellitus without complications: Secondary | ICD-10-CM | POA: Diagnosis present

## 2011-12-13 DIAGNOSIS — Z95810 Presence of automatic (implantable) cardiac defibrillator: Secondary | ICD-10-CM

## 2011-12-13 DIAGNOSIS — Z882 Allergy status to sulfonamides status: Secondary | ICD-10-CM

## 2011-12-13 DIAGNOSIS — I509 Heart failure, unspecified: Secondary | ICD-10-CM

## 2011-12-13 DIAGNOSIS — I059 Rheumatic mitral valve disease, unspecified: Secondary | ICD-10-CM | POA: Diagnosis present

## 2011-12-13 DIAGNOSIS — I2510 Atherosclerotic heart disease of native coronary artery without angina pectoris: Secondary | ICD-10-CM | POA: Diagnosis present

## 2011-12-13 DIAGNOSIS — I472 Ventricular tachycardia, unspecified: Secondary | ICD-10-CM | POA: Diagnosis not present

## 2011-12-13 DIAGNOSIS — Z888 Allergy status to other drugs, medicaments and biological substances status: Secondary | ICD-10-CM

## 2011-12-13 DIAGNOSIS — Z86718 Personal history of other venous thrombosis and embolism: Secondary | ICD-10-CM

## 2011-12-13 DIAGNOSIS — I340 Nonrheumatic mitral (valve) insufficiency: Secondary | ICD-10-CM

## 2011-12-13 DIAGNOSIS — I5023 Acute on chronic systolic (congestive) heart failure: Principal | ICD-10-CM | POA: Diagnosis present

## 2011-12-13 DIAGNOSIS — Z86711 Personal history of pulmonary embolism: Secondary | ICD-10-CM

## 2011-12-13 HISTORY — DX: Rheumatic mitral valve disease, unspecified: I05.9

## 2011-12-13 HISTORY — DX: Unspecified jaundice: R17

## 2011-12-13 HISTORY — DX: Heart failure, unspecified: I50.9

## 2011-12-13 HISTORY — DX: Gastric ulcer, unspecified as acute or chronic, without hemorrhage or perforation: K25.9

## 2011-12-13 HISTORY — DX: Reserved for inherently not codable concepts without codable children: IMO0001

## 2011-12-13 HISTORY — DX: Acute myocardial infarction, unspecified: I21.9

## 2011-12-13 HISTORY — DX: Shortness of breath: R06.02

## 2011-12-13 HISTORY — DX: Encounter for other specified aftercare: Z51.89

## 2011-12-13 HISTORY — DX: Sleep apnea, unspecified: G47.30

## 2011-12-13 HISTORY — DX: Anemia, unspecified: D64.9

## 2011-12-13 HISTORY — PX: CARDIAC CATHETERIZATION: SHX172

## 2011-12-13 HISTORY — DX: Personal history of other diseases of the digestive system: Z87.19

## 2011-12-13 HISTORY — DX: Pure hypercholesterolemia, unspecified: E78.00

## 2011-12-13 HISTORY — DX: Cardiac murmur, unspecified: R01.1

## 2011-12-13 HISTORY — DX: Angina pectoris, unspecified: I20.9

## 2011-12-13 HISTORY — DX: Pneumonia, unspecified organism: J18.9

## 2011-12-13 HISTORY — DX: Essential (primary) hypertension: I10

## 2011-12-13 HISTORY — DX: Cerebral infarction, unspecified: I63.9

## 2011-12-13 HISTORY — PX: RIGHT HEART CATHETERIZATION: SHX5447

## 2011-12-13 HISTORY — DX: Other pulmonary embolism without acute cor pulmonale: I26.99

## 2011-12-13 HISTORY — DX: Acute embolism and thrombosis of unspecified deep veins of unspecified lower extremity: I82.409

## 2011-12-13 LAB — CBC
HCT: 40.2 % (ref 39.0–52.0)
Hemoglobin: 12.7 g/dL — ABNORMAL LOW (ref 13.0–17.0)
MCV: 87 fL (ref 78.0–100.0)
RDW: 17.7 % — ABNORMAL HIGH (ref 11.5–15.5)
WBC: 12.8 10*3/uL — ABNORMAL HIGH (ref 4.0–10.5)

## 2011-12-13 LAB — COMPREHENSIVE METABOLIC PANEL
Albumin: 3.4 g/dL — ABNORMAL LOW (ref 3.5–5.2)
Alkaline Phosphatase: 95 U/L (ref 39–117)
BUN: 39 mg/dL — ABNORMAL HIGH (ref 6–23)
CO2: 34 mEq/L — ABNORMAL HIGH (ref 19–32)
Chloride: 97 mEq/L (ref 96–112)
Creatinine, Ser: 1.98 mg/dL — ABNORMAL HIGH (ref 0.50–1.35)
GFR calc Af Amer: 37 mL/min — ABNORMAL LOW (ref >90)
GFR calc non Af Amer: 32 mL/min — ABNORMAL LOW (ref >90)
Glucose, Bld: 89 mg/dL (ref 70–99)
Total Bilirubin: 0.4 mg/dL (ref 0.3–1.2)

## 2011-12-13 LAB — PROTIME-INR
INR: 2.1 — ABNORMAL HIGH (ref 0.00–1.49)
Prothrombin Time: 23.9 seconds — ABNORMAL HIGH (ref 11.6–15.2)

## 2011-12-13 LAB — DIFFERENTIAL
Basophils Absolute: 0.1 10*3/uL (ref 0.0–0.1)
Basophils Relative: 1 % (ref 0–1)
Eosinophils Absolute: 0.3 10*3/uL (ref 0.0–0.7)
Neutro Abs: 9 10*3/uL — ABNORMAL HIGH (ref 1.7–7.7)
Neutrophils Relative %: 71 % (ref 43–77)

## 2011-12-13 LAB — POCT I-STAT 3, VENOUS BLOOD GAS (G3P V)
Acid-Base Excess: 2 mmol/L (ref 0.0–2.0)
Acid-Base Excess: 3 mmol/L — ABNORMAL HIGH (ref 0.0–2.0)
Acid-Base Excess: 6 mmol/L — ABNORMAL HIGH (ref 0.0–2.0)
Bicarbonate: 28.6 mEq/L — ABNORMAL HIGH (ref 20.0–24.0)
O2 Saturation: 52 %
O2 Saturation: 57 %
TCO2: 30 mmol/L (ref 0–100)
TCO2: 32 mmol/L (ref 0–100)
pCO2, Ven: 53.4 mmHg — ABNORMAL HIGH (ref 45.0–50.0)
pCO2, Ven: 53.8 mmHg — ABNORMAL HIGH (ref 45.0–50.0)
pH, Ven: 7.337 — ABNORMAL HIGH (ref 7.250–7.300)
pH, Ven: 7.388 — ABNORMAL HIGH (ref 7.250–7.300)
pO2, Ven: 31 mmHg (ref 30.0–45.0)

## 2011-12-13 LAB — TSH: TSH: 2.934 u[IU]/mL (ref 0.350–4.500)

## 2011-12-13 SURGERY — RIGHT HEART CATH
Anesthesia: LOCAL

## 2011-12-13 MED ORDER — SODIUM CHLORIDE 0.9 % IV SOLN: 250.0000 mL | INTRAVENOUS | Status: DC | PRN

## 2011-12-13 MED ORDER — NON FORMULARY: Freq: Every day | Status: DC

## 2011-12-13 MED ORDER — OMEGA-3 FATTY ACIDS 1000 MG PO CAPS: 1.0000 g | ORAL_CAPSULE | Freq: Every day | ORAL | Status: DC

## 2011-12-13 MED ORDER — SODIUM CHLORIDE 0.9 % IJ SOLN: 3.0000 mL | Freq: Two times a day (BID) | INTRAMUSCULAR | Status: DC

## 2011-12-13 MED ORDER — MILRINONE IN DEXTROSE 200-5 MCG/ML-% IV SOLN
0.1250 ug/kg/min | INTRAVENOUS | Status: DC
  Administered 2011-12-13 – 2011-12-15 (×4): 0.25 ug/kg/min via INTRAVENOUS
  Administered 2011-12-16: 0.125 ug/kg/min via INTRAVENOUS
  Administered 2011-12-16: 0.25 ug/kg/min via INTRAVENOUS
  Filled 2011-12-13 (×6): qty 100

## 2011-12-13 MED ORDER — SODIUM CHLORIDE 0.9 % IJ SOLN: 3.0000 mL | INTRAMUSCULAR | Status: DC | PRN

## 2011-12-13 MED ORDER — ONDANSETRON HCL 4 MG/2ML IJ SOLN: 4.0000 mg | Freq: Four times a day (QID) | INTRAMUSCULAR | Status: DC | PRN

## 2011-12-13 MED ORDER — PRAVASTATIN SODIUM 40 MG PO TABS
40.0000 mg | ORAL_TABLET | Freq: Every day | ORAL | Status: DC
  Administered 2011-12-13 – 2011-12-17 (×5): 40 mg via ORAL
  Filled 2011-12-13 (×6): qty 1

## 2011-12-13 MED ORDER — ZOLPIDEM TARTRATE 5 MG PO TABS: 10.0000 mg | ORAL_TABLET | Freq: Every evening | ORAL | Status: DC | PRN

## 2011-12-13 MED ORDER — CARVEDILOL 6.25 MG PO TABS
6.2500 mg | ORAL_TABLET | Freq: Two times a day (BID) | ORAL | Status: DC
  Administered 2011-12-14 – 2011-12-18 (×9): 6.25 mg via ORAL
  Filled 2011-12-13 (×11): qty 1

## 2011-12-13 MED ORDER — FUROSEMIDE 10 MG/ML IJ SOLN
80.0000 mg | Freq: Two times a day (BID) | INTRAMUSCULAR | Status: DC
  Administered 2011-12-14: 80 mg via INTRAVENOUS
  Filled 2011-12-13 (×3): qty 8

## 2011-12-13 MED ORDER — ALUM & MAG HYDROXIDE-SIMETH 200-200-20 MG/5ML PO SUSP: 30.0000 mL | Freq: Four times a day (QID) | ORAL | Status: DC | PRN

## 2011-12-13 MED ORDER — ALLOPURINOL 100 MG PO TABS
200.0000 mg | ORAL_TABLET | Freq: Every day | ORAL | Status: DC
  Administered 2011-12-13 – 2011-12-17 (×5): 200 mg via ORAL
  Filled 2011-12-13 (×6): qty 2

## 2011-12-13 MED ORDER — ASPIRIN 81 MG PO TABS: 81.0000 mg | ORAL_TABLET | Freq: Every day | ORAL | Status: DC

## 2011-12-13 MED ORDER — FAMOTIDINE 20 MG PO TABS
20.0000 mg | ORAL_TABLET | Freq: Two times a day (BID) | ORAL | Status: DC
  Administered 2011-12-13 – 2011-12-18 (×10): 20 mg via ORAL
  Filled 2011-12-13 (×13): qty 1

## 2011-12-13 MED ORDER — SODIUM CHLORIDE 0.9 % IJ SOLN
3.0000 mL | Freq: Two times a day (BID) | INTRAMUSCULAR | Status: DC
  Administered 2011-12-13 – 2011-12-18 (×6): 3 mL via INTRAVENOUS

## 2011-12-13 MED ORDER — SODIUM CHLORIDE 0.9 % IJ SOLN
3.0000 mL | Freq: Two times a day (BID) | INTRAMUSCULAR | Status: DC
  Administered 2011-12-13 – 2011-12-18 (×4): 3 mL via INTRAVENOUS

## 2011-12-13 MED ORDER — ONDANSETRON HCL 4 MG PO TABS: 4.0000 mg | ORAL_TABLET | Freq: Four times a day (QID) | ORAL | Status: DC | PRN

## 2011-12-13 MED ORDER — FENTANYL CITRATE 0.05 MG/ML IJ SOLN
INTRAMUSCULAR | Status: AC
  Filled 2011-12-13: qty 2

## 2011-12-13 MED ORDER — ACETAMINOPHEN 650 MG RE SUPP: 650.0000 mg | Freq: Four times a day (QID) | RECTAL | Status: DC | PRN

## 2011-12-13 MED ORDER — LIDOCAINE HCL (PF) 1 % IJ SOLN
INTRAMUSCULAR | Status: AC
  Filled 2011-12-13: qty 30

## 2011-12-13 MED ORDER — POTASSIUM CHLORIDE CRYS ER 20 MEQ PO TBCR
20.0000 meq | EXTENDED_RELEASE_TABLET | Freq: Two times a day (BID) | ORAL | Status: DC
  Administered 2011-12-13 – 2011-12-16 (×6): 20 meq via ORAL
  Filled 2011-12-13 (×7): qty 1

## 2011-12-13 MED ORDER — LORAZEPAM 0.5 MG PO TABS
1.0000 mg | ORAL_TABLET | Freq: Every evening | ORAL | Status: DC | PRN
  Administered 2011-12-14: 1 mg via ORAL
  Filled 2011-12-13: qty 2

## 2011-12-13 MED ORDER — GABAPENTIN 300 MG PO CAPS
300.0000 mg | ORAL_CAPSULE | Freq: Two times a day (BID) | ORAL | Status: DC
  Administered 2011-12-13 – 2011-12-18 (×10): 300 mg via ORAL
  Filled 2011-12-13 (×12): qty 1

## 2011-12-13 MED ORDER — ACETAMINOPHEN 325 MG PO TABS
650.0000 mg | ORAL_TABLET | ORAL | Status: DC | PRN
  Administered 2011-12-15 – 2011-12-17 (×3): 650 mg via ORAL
  Filled 2011-12-13 (×3): qty 2

## 2011-12-13 MED ORDER — HEPARIN (PORCINE) IN NACL 2-0.9 UNIT/ML-% IJ SOLN
INTRAMUSCULAR | Status: AC
  Filled 2011-12-13: qty 1000

## 2011-12-13 MED ORDER — ACETAMINOPHEN 325 MG PO TABS
650.0000 mg | ORAL_TABLET | Freq: Four times a day (QID) | ORAL | Status: DC | PRN
  Administered 2011-12-14: 650 mg via ORAL

## 2011-12-13 MED ORDER — HEPARIN SODIUM (PORCINE) 5000 UNIT/ML IJ SOLN
5000.0000 [IU] | Freq: Three times a day (TID) | INTRAMUSCULAR | Status: DC
  Administered 2011-12-13: 5000 [IU] via SUBCUTANEOUS
  Filled 2011-12-13 (×6): qty 1

## 2011-12-13 MED ORDER — ASPIRIN EC 81 MG PO TBEC
81.0000 mg | DELAYED_RELEASE_TABLET | Freq: Every day | ORAL | Status: DC
  Administered 2011-12-13 – 2011-12-18 (×6): 81 mg via ORAL
  Filled 2011-12-13 (×6): qty 1

## 2011-12-13 MED ORDER — MIDAZOLAM HCL 2 MG/2ML IJ SOLN
INTRAMUSCULAR | Status: AC
  Filled 2011-12-13: qty 2

## 2011-12-13 MED ORDER — ACETAMINOPHEN 500 MG PO TABS
500.0000 mg | ORAL_TABLET | Freq: Two times a day (BID) | ORAL | Status: DC | PRN
  Filled 2011-12-13: qty 1

## 2011-12-13 MED ORDER — OMEGA-3-ACID ETHYL ESTERS 1 G PO CAPS
1.0000 g | ORAL_CAPSULE | Freq: Two times a day (BID) | ORAL | Status: DC
  Administered 2011-12-13 – 2011-12-18 (×10): 1 g via ORAL
  Filled 2011-12-13 (×12): qty 1

## 2011-12-13 NOTE — H&P (Signed)
   Brief admit note:  71 y/o male with CHF and iCM EF 20%, CRI and severe MR followed by Dr. Andee Lineman admitted for worsening HF (wt up 227 ->244) admitted for RHC and evaluation for possible advanced therapies.   RHC cath 1/10:  RA = 14  RV = 51/10/13  PA = 53/28 (37)  PCW = mean 31 with v waves to 45  Fick cardiac output/index = 4.2/1.9  Thermodilution CO/CI = 4.6/2.1  PVR = 1.7  FA sat = 94%  PA sat = 52%, 54%    Plan: Given class IIIb/IV symptoms will start IV milrinone to assist diuresis. Will plan tune-up then possible inpatient CPX to evaluate for possible advanced therapies.   Truman Hayward 6:28 PM

## 2011-12-13 NOTE — Interval H&P Note (Signed)
History and Physical Interval Note:  12/13/2011 5:33 PM  [name]  has presented today for surgery, with the diagnosis of heart failure  The various methods of treatment have been discussed with the patient and family. After consideration of risks, benefits and other options for treatment, the patient has consented to  Procedure(s): RIGHT HEART CATH as a surgical intervention .  The patients' history has been reviewed, patient examined, no change in status, stable for surgery.  I have reviewed the patients' chart and labs.  Questions were answered to the patient's satisfaction.     Gayathri Futrell

## 2011-12-13 NOTE — H&P (View-Only) (Signed)
Hayden Bottoms, MD, [facility] ABIM Board Certified in Adult Cardiovascular Medicine,Internal Medicine and Critical Care Medicine    ZO:[id number] FOR RIGHT HEART CATHETERIZATION AND INTRAVENOUS MILRINONE  HPI:  The patient is a 71 year old male with a history of chronic renal insufficiency, ischemic cardiomyopathy ejection fraction 35-40%. He status post CRT-D placement after cardiac arrest. Ejection fraction by nuclear imaging more recently is 38% with multiple fixed defects without any viability in the inferior and inferoposterior walls.  The patient reports a marked increase in his shortness of breath. He states that he has no energy. He has frequent coughing spells. He has one pillow orthopnea he has no PND mainly because with he sleeps with a CPAP mask at night. He states on the slightest things making short of breath. His in NYHA class III symptoms improved with resting.  He also has been taking frequent doses of prednisone for what he presumes is gout.  His weight is markedly increased since his last office visit from 227 pounds now up to 244 pounds. He reports significant increase in lower extremity edema.  He denies any chest pain. He reports no palpitations or presyncope or syncope. The patient had a TEE last week which showed that he had severe LV systolic dysfunction, ejection fraction approximately 20% and moderate to severe mitral regurgitation secondary to a dilated left ventricle with poor coaptation of the mitral valve leaflets. The patient's functional status has worsened and after discussion with the advanced heart failure team who felt the patient would benefit from an admission and obtain a right heart catheterization to obtain hemodynamics.  Subsequently, he would receive intravenous milrinone in the hope to improve his functional status.   PMH: reviewed and listed in Problem List in Electronic Records (and see below) Past Medical History  Diagnosis Date  . Diabetes mellitus     . 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 Gout   . CAD (coronary artery disease)   . Gout   . Hypotension     ORTHOSTATIC   Past Surgical History  Procedure Date  . Dvt   . Biv     ICD 12/11 MEDTRONIC    Allergies/SH/FHX : available in Electronic Records for review  Allergies  Allergen Reactions  . Cefuroxime   . Sulfonamide Derivatives    History   Social History  . Marital Status: Married    Spouse Name: N/A    Number of Children: N/A  . Years of Education: N/A   Occupational History  . Not on file.   Social History Main Topics  . Smoking status: Former Smoker -- 1.0 packs/day for 30 years    Types: Cigarettes    Quit date: 12/03/1986  . Smokeless tobacco: Never Used  . Alcohol Use: No  . Drug Use: Not on file  . Sexually Active: Not on file   Other Topics Concern  . Not on file   Social History Narrative  . No narrative on file   No family history on file.  Medications: Current Outpatient Prescriptions  Medication Sig Dispense Refill  . allopurinol (ZYLOPRIM) 300 MG tablet Take 300 mg by mouth daily.        [name] aspirin 81 MG tablet Take 81 mg by mouth daily.        . bumetanide (BUMEX) 2 MG tablet Take 2 mg by mouth daily.      . carvedilol (COREG) 6.25 MG tablet Take 9.375 mg by mouth 2 (two) times  daily with a meal.      . dextromethorphan-guaiFENesin (MUCINEX DM) 30-600 MG per 12 hr tablet Take 1 tablet by mouth 2 (two) times daily as needed. For cough      . diphenhydrAMINE (SOMINEX) 25 MG tablet Take 25 mg by mouth as needed.        . ergocalciferol (VITAMIN D2) 50000 UNITS capsule Take 50,000 Units by mouth once a week.        . fish oil-omega-3 fatty acids 1000 MG capsule Take 1 g by mouth daily.        [name] gabapentin (NEURONTIN) 300 MG capsule Take 300 mg by mouth 2 (two) times daily.        [name] LORazepam (ATIVAN) 1 MG tablet Take 1 mg by mouth at bedtime.       . pravastatin (PRAVACHOL) 40 MG tablet Take 40 mg by mouth daily.        .  predniSONE (DELTASONE) 20 MG tablet Take 20 mg by mouth as directed. Take for gout flare ups      . ranitidine (ZANTAC) 150 MG capsule Take 150 mg by mouth 2 (two) times daily.        . Sulfacetamide Sodium-Sulfur (SUPHERA) 10-5 % CREA Apply 1 application topically daily.         ROS: No nausea or vomiting. No fever or chills.No melena or hematochezia.No bleeding.No claudication  Physical Exam: There were no vitals taken for this visit.vitals will be taken on admission.  Vitals were taken during the last office visit .  Used to last office visit as the present history and physical. Refer for physical examination to recent office note. The patient and his family were counseled for approximately 45 min. On Monday prior to this admission to discuss the issues around right heart catheterization, intravenous milrinone and possible evaluation for a future left ventricular assistive device.  12lead ECG: Limited bedside ECHO:N/A Counseling was provided regarding the current medical condition and included: . Diagnosis, impressions, prognosis, recommended diagnostic studies  . Risks and benefits of treatment options  . Instructions for management, treatment and/or follow-up care  . Importance of compliance with treatment, risk factor reduction  . Patient and/or family education    Time spent counseling was 45 minutes and recorded in the Problem List.   Patient Active Problem List  Diagnoses  . DM  . HYPERLIPIDEMIA-MIXED  . OBSTRUCTIVE SLEEP APNEA  . CAD, NATIVE VESSEL  . CORONARY ARTERY DISEASE, S/P PTCA  . ATRIAL FIBRILLATION  . LEFT VENTRICULAR FUNCTION, DECREASED-minimal viability with results as outlined above  . DVT-warfarin therapy  . HYPOTENSION, ORTHOSTATIC  . PULMONARY NODULE  . 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 OTH SPEC D/O RESULT FROM IMPAIRED RENAL FUNCTION  . PULMONARY EMBOLISM, HX OF  . CHRONIC SYSTOLIC HEART FAILURE-with worsening ejection  fraction approximately 20% by recent TEE  . SHORTNESS OF BREATH  . POSTSURG PERCUT TRANSLUMINAL COR ANGPLSTY STS  . CARDIAC ARRESTstatus post ICD  . Encounter for long-term (current) use of anticoagulants  . Edema  . Peripheral neuropathy  . Acute on chronic systolic heart failure-NYHA class IIIB  . Mitral regurgitation-moderate to severe  . Renal insufficiency    PLAN   The patient will be admitted on 12/13/2011, and will be kept n.p.o. After midnight.    The plan is to proceed with a right heart catheterizationOn 12/14/2011, with Dr. Riley Kill in the morning.  Coumadin was held prior to admission, but patient does not need a  left heart catheterization.  He has little viable tissue as assessed by a prior nuclear perfusion study.  Right heart catheterization can be performed but a slightly elevated PT INR.  After right heart catheterization if the INR is less than 2 patient will need to be on full dose intravenous heparin and heparin subcutaneous for DVT prophylaxis can be discontinued.  Warfarin can be resumed with overlap of heparin until PT INR is between 2 and 3  Treatment will be guided in hospital IV advanced heart failure team and the plan is to start milrinone after right heart catheterization.  Decision will be made by the advanced heart failure team.  The patient will benefit from intermittent milrinone or hold IV milrinone.  An evaluation for left ventricular assist device will also be made if felt to be appropriate.

## 2011-12-13 NOTE — Op Note (Signed)
Cardiac Cath Procedure Note:  Indication: CHF  Procedures performed:  1) Right heart catheterization  Description of procedure:   The risks and indication of the procedure were explained. Consent was signed and placed on the chart. An appropriate timeout was taken prior to the procedure. The right groin was prepped and draped in the routine sterile fashion and anesthetized with 1% local lidocaine.   A 7 FR venous sheath was placed in the right femoral vein using a modified Seldinger technique. A standard Swan-Ganz catheter was used for the procedure.   Complications: None apparent.  Findings:  RA = 14 RV =  51/10/13 PA =   53/28 (37) PCW = mean 31 with v waves to 45 Fick cardiac output/index = 4.2/1.9 Thermodilution CO/CI      = 4.6/2.1 PVR = 1.7 FA sat = 94% PA sat = 52%, 54%   Assessment:  1) CHF with markedly elevated L-sided filling pressures and severe MR 2) Low normal cardiac output 3) Moderate pulmonary venous HTN  Plan/Discussion:  Given class IIIb/IV symptoms will start IV milrinone to assist diuresis. Will need to review TEE with Dr. Andee Lineman to review options of high-risk MVR vs clipping or possible advanced therapies.   [name] 6:12 PM

## 2011-12-14 ENCOUNTER — Other Ambulatory Visit: Payer: Self-pay

## 2011-12-14 ENCOUNTER — Inpatient Hospital Stay (HOSPITAL_COMMUNITY): Payer: Medicare Other

## 2011-12-14 DIAGNOSIS — I059 Rheumatic mitral valve disease, unspecified: Secondary | ICD-10-CM

## 2011-12-14 DIAGNOSIS — I4891 Unspecified atrial fibrillation: Secondary | ICD-10-CM

## 2011-12-14 DIAGNOSIS — I5023 Acute on chronic systolic (congestive) heart failure: Principal | ICD-10-CM

## 2011-12-14 LAB — BASIC METABOLIC PANEL
BUN: 39 mg/dL — ABNORMAL HIGH (ref 6–23)
Calcium: 9.1 mg/dL (ref 8.4–10.5)
Creatinine, Ser: 1.87 mg/dL — ABNORMAL HIGH (ref 0.50–1.35)
GFR calc Af Amer: 40 mL/min — ABNORMAL LOW (ref >90)

## 2011-12-14 LAB — PROTIME-INR: Prothrombin Time: 22.6 seconds — ABNORMAL HIGH (ref 11.6–15.2)

## 2011-12-14 MED ORDER — WARFARIN SODIUM 7.5 MG PO TABS
7.5000 mg | ORAL_TABLET | Freq: Once | ORAL | Status: AC
  Administered 2011-12-14: 7.5 mg via ORAL
  Filled 2011-12-14: qty 1

## 2011-12-14 MED ORDER — FUROSEMIDE 10 MG/ML IJ SOLN
80.0000 mg | Freq: Three times a day (TID) | INTRAMUSCULAR | Status: DC
  Administered 2011-12-14 – 2011-12-15 (×4): 80 mg via INTRAVENOUS
  Filled 2011-12-14 (×5): qty 8

## 2011-12-14 MED ORDER — WARFARIN SODIUM 6 MG PO TABS
6.0000 mg | ORAL_TABLET | Freq: Once | ORAL | Status: DC
  Filled 2011-12-14: qty 1

## 2011-12-14 MED ORDER — HEPARIN SOD (PORCINE) IN D5W 100 UNIT/ML IV SOLN
1600.0000 [IU]/h | INTRAVENOUS | Status: DC
  Administered 2011-12-14: 1600 [IU]/h via INTRAVENOUS
  Filled 2011-12-14 (×2): qty 250

## 2011-12-14 NOTE — Progress Notes (Signed)
12/14/11 Nursing Note: Pt ambulated to nursing station and back to room. Pt has no complaints of SOB. Pt free of falls during ambulation. Will continue to monitor pt. Diona Peregoy Scientist, clinical (histocompatibility and immunogenetics).

## 2011-12-14 NOTE — Plan of Care (Signed)
Problem: Phase I Progression Outcomes Goal: Other Phase I Outcomes/Goals Outcome: Completed/Met Date Met:  12/14/11 Post cath orders

## 2011-12-14 NOTE — Plan of Care (Signed)
Problem: Food- and Nutrition-Related Knowledge Deficit (NB-1.1) Goal: Nutrition education Formal process to instruct or train a patient/client in a skill or to impart knowledge to help patients/clients voluntarily manage or modify food choices and eating behavior to maintain or improve health.  Outcome: Completed/Met Date Met:  12/14/11 RD spoke patient and patients wife regarding the diet they follow at home. Wife stated they mostly cook, but she uses lite salt and kosher salt in cooking. RD explained the proper amount to use of these. Patients wife had many questions about a general healthy diet. RD answered all questions. Patient was provided with the low sodium handout from the [employer] of [employer]. RD explained what types of foods to limit and avoid. All questions were answered. Rd reviewed chart, no additional nutrition interventions needed at this time. RD expects fair to good compliance with diet.   [name] MARIE [phone]

## 2011-12-14 NOTE — Progress Notes (Signed)
Advanced Heart Failure Rounding Note   Subjective:    71 y/o male with CHF and iCM EF 20%, CRI and severe MR followed by Dr. Andee Lineman admitted for worsening HF (wt up 227 ->244) admitted for RHC and evaluation for possible advanced therapies.  (Weight down 14 lbs at home since evaluation with Dr. Andee Lineman, currently running 222)  RHC 12-15-10: RA = 14, RV = 5January 13, 2013, PA = 53/28 (37), PCW = mean 31 with v waves to 45, Fick cardiac output/index = 4.2/1.9, PVR = 1.7   IV milrinone started post cath.  Diuresis sluggish but IV lasix just given.  NO SOB/CP/orthopnea/PND.       Objective:    Vital Signs:   Temp:  [97.5 F (36.4 C)-98.5 F (36.9 C)] 97.5 F (36.4 C) (01/11 1400) Pulse Rate:  [71-90] 77  (01/11 1400) Resp:  [18-20] 20  (01/11 1400) BP: (102-136)/(63-87) 102/66 mmHg (01/11 1400) SpO2:  [95 %-98 %] 95 % (01/11 1400) Weight:  [101.9 kg (224 lb 10.4 oz)-102.331 kg (225 lb 9.6 oz)] 102.331 kg (225 lb 9.6 oz) (01/11 0551) Last BM Date: 16-Dec-2011  24-hour weight change: Weight change:   Intake/Output:   Intake/Output Summary (Last 24 hours) at 12/14/11 1448 Last data filed at 12/14/11 1300  Gross per 24 hour  Intake 670.35 ml  Output   1750 ml  Net -1079.65 ml     Physical Exam: General: Well-nourished  Neck: supple, JVP jaw. No thyromegaly nonnodular thyroid. Normal carotid upstroke and no carotid bruits  Lungs: clear to auscultation  Cardiac: Regular rate and rhythm with normal S1-S2,  Vascular: 1+ peripheral pitting edema. Dorsalis pedis and posterior tibial pulses are 1+ bilaterally  Skin: Warm and dry  Telemetry: Vpaced with occ PVCs  Labs: Basic Metabolic Panel:  Lab 12/14/11 1610 12/16/11 1701  NA 143 139  K 4.6 4.4  CL 103 97  CO2 32 34*  GLUCOSE 102* 89  BUN 39* 39*  CREATININE 1.87* 1.98*  CALCIUM 9.1 9.4  MG -- --  PHOS -- --    Liver Function Tests:  Lab December 16, 2011 1701  AST 16  ALT 10  ALKPHOS 95  BILITOT 0.4  PROT 6.9  ALBUMIN 3.4*    No results found for this basename: LIPASE:5,AMYLASE:5 in the last 168 hours No results found for this basename: AMMONIA:3 in the last 168 hours  CBC:  Lab Dec 16, 2011 1701  WBC 12.8*  NEUTROABS 9.0*  HGB 12.7*  HCT 40.2  MCV 87.0  PLT 401*    Cardiac Enzymes: No results found for this basename: CKTOTAL:5,CKMB:5,CKMBINDEX:5,TROPONINI:5 in the last 168 hours  BNP: No components found with this basename: POCBNP:5  CBG: No results found for this basename: GLUCAP:5 in the last 168 hours  Coagulation Studies:  Basename 12/14/11 0500 2011-12-16 1701  LABPROT 22.6* 23.9*  INR 1.95* 2.10*    Other results:   Imaging: X-ray Chest Pa And Lateral   12/14/2011  *RADIOLOGY REPORT*  Clinical Data: Chest pain.  Heart failure.  CHEST - 2 VIEW  Comparison: None.  Findings: 0652 hours. The cardiopericardial silhouette is enlarged. There is pulmonary vascular congestion without overt pulmonary edema.  There is some linear and patchy air density in the left midlung, obscured by the battery pack for the pacer / AICD. Prominent right paratracheal opacity noted.  Probable small left pleural effusion. Bones are diffusely demineralized.  IMPRESSION: Cardiomegaly with vascular congestion and small left pleural effusion.  Question atelectasis or scarring behind the battery pack for the  permanent pacemaker.  As neoplasm cannot be excluded given this appearance, CT chest without contrast is recommended to further evaluate.  Prominent right paratracheal opacity.  This is probably related to ectasia of the arch vessel anatomy, but can also be further assessed at the time of CT.  Original Report Authenticated By: ERIC A. MANSELL, M.D.     Medications:     Scheduled Medications:    . allopurinol  200 mg Oral QHS  . aspirin EC  81 mg Oral Daily  . carvedilol  6.25 mg Oral BID WC  . famotidine  20 mg Oral BID  . fentaNYL      . furosemide  80 mg Intravenous TID  . gabapentin  300 mg Oral BID  .  heparin      . lidocaine      . midazolam      . omega-3 acid ethyl esters  1 g Oral BID  . potassium chloride  20 mEq Oral BID  . pravastatin  40 mg Oral q1800  . sodium chloride  3 mL Intravenous Q12H  . sodium chloride  3 mL Intravenous Q12H  . warfarin  6 mg Oral ONCE-1800  . DISCONTD: aspirin  81 mg Oral Daily  . DISCONTD: fish oil-omega-3 fatty acids  1 g Oral Daily  . DISCONTD: furosemide  80 mg Intravenous BID  . DISCONTD: heparin  5,000 Units Subcutaneous Q8H  . DISCONTD: NON FORMULARY   Oral Daily  . DISCONTD: sodium chloride  3 mL Intravenous Q12H    Infusions:    . heparin 1,600 Units/hr (12/14/11 0636)  . milrinone 0.25 mcg/kg/min (12/14/11 0926)    PRN Medications: sodium chloride, sodium chloride, acetaminophen, alum & mag hydroxide-simeth, LORazepam, ondansetron (ZOFRAN) IV, sodium chloride, zolpidem, DISCONTD: sodium chloride, DISCONTD: acetaminophen, DISCONTD: acetaminophen, DISCONTD: acetaminophen, DISCONTD: ondansetron (ZOFRAN) IV, DISCONTD: ondansetron, DISCONTD: sodium chloride, DISCONTD: sodium chloride   Assessment:   1) Acute on chronic systolic CHF with markedly elevated L-sided filling pressures  2) Low normal cardiac output  3) Moderate pulmonary venous HTN 4) Severe mitral regurgitation  5) CAD 6) iCM EF 20% 7) Chronic renal failure, stage 4   Plan/Discussion:    Renal function improving.  Urine output sluggish. Will continue milrinone and increase lasix.  INR 1.9 so can stop coumadin. Consider in patient CPX test next week as part of work-up for advanced therapies.    Length of Stay: 1  12/14/2011, 2:48 PM Truman Hayward 2:58 PM

## 2011-12-14 NOTE — Progress Notes (Signed)
UR Completed.  [name] 12/14/2011 [phone]  

## 2011-12-14 NOTE — Progress Notes (Addendum)
PHARMACY - ANTICOAGULATION CONSULT INITIAL NOTE  Pharmacy Consult for: Coumadin and Heparin  Indication: H/o VTE    Patient Data:   Allergies: Allergies  Allergen Reactions  . Levaquin Other (See Comments)    Doesn't tolerate  . Sulfonamide Derivatives Other (See Comments)    unknown  . Cefuroxime Rash    Patient Measurements: Height: 5\' 10"  (177.8 cm) Weight: 224 lb 10.4 oz (101.9 kg) (scale A) IBW/kg (Calculated) : 73  Adjusted Body Weight: 94 kg  Vital Signs: Temp:  [98.2 F (36.8 C)-98.5 F (36.9 C)] 98.5 F (36.9 C) (01/10 2015) Pulse Rate:  [71-90] 88  (01/11 0000) Resp:  [20] 20  (01/11 0000) BP: (105-136)/(63-87) 127/63 mmHg (01/11 0000) SpO2:  [95 %-98 %] 98 % (01/11 0000) Weight:  [224 lb 10.4 oz (101.9 kg)] 224 lb 10.4 oz (101.9 kg) (01/10 1646)  Intake/Output from previous day:  Intake/Output Summary (Last 24 hours) at 12/14/11 0554 Last data filed at 12/14/11 0000  Gross per 24 hour  Intake      3 ml  Output    600 ml  Net   -597 ml    Labs:  Basename 12/14/11 0500 12/13/11 1701  HGB -- 12.7*  HCT -- 40.2  PLT -- 401*  APTT -- 38*  LABPROT 22.6* 23.9*  INR 1.95* 2.10*  HEPARINUNFRC -- --  CREATININE -- 1.98*  CKTOTAL -- --  CKMB -- --  TROPONINI -- --   Estimated Creatinine Clearance: 40.4 ml/min (by C-G formula based on Cr of 1.98).  Medical History: Past Medical History  Diagnosis Date  . Diabetes mellitus   . 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 CAD (coronary artery disease)   . Gout   . Hypotension     ORTHOSTATIC  . DVT of lower extremity (deep venous thrombosis)     left  . Hypertension   . High cholesterol   . Pulmonary embolism on left 1988  . Myocardial infarction 1988  . Angina   . CHF (congestive heart failure)   . Heart murmur   . Mitral valve disorder   . Pneumonia 1993; 2009    "both serious cases"  . Pneumonia 2010    "not hospitalized"  . Shortness of breath on exertion   . Sleep apnea   .  Blood transfusion   . Anemia   . H/O hiatal hernia   . Stomach ulcer 1987-1988  . Jaundice     "@ birth & when I was in 7th grade"  . 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 allopurinol  200 mg Oral QHS  . aspirin EC  81 mg Oral Daily  . carvedilol  6.25 mg Oral BID WC  . famotidine  20 mg Oral BID  . fentaNYL      . furosemide  80 mg Intravenous BID  . gabapentin  300 mg Oral BID  . heparin      . heparin  5,000 Units Subcutaneous Q8H  . lidocaine      . midazolam      . omega-3 acid ethyl esters  1 g Oral BID  . potassium chloride  20 mEq Oral BID  . pravastatin  40 mg Oral q1800  . sodium chloride  3 mL Intravenous Q12H  . sodium chloride  3 mL Intravenous Q12H  . DISCONTD: aspirin  81  mg Oral Daily  . DISCONTD: fish oil-omega-3 fatty acids  1 g Oral Daily  . DISCONTD: NON FORMULARY   Oral Daily  . DISCONTD: sodium chloride  3 mL Intravenous Q12H     Assessment:  71 y.o. male admitted on 12/13/2011, with h/o VTE now s/p right heart cath. Pharmacy consulted to manage IV heparin and Coumadin as INR < 2. Home Coumadin regimen of 2.5mg  daily, except 5mg  on Monday and Friday.   Goal of Therapy:  1. Heparin level 0.3-0.7 units/ml 2. INR 2-3  Plan:  1. Heparin IV infusion of 1600 units/hr.  2. Heparin level in 8 hours.  3. Daily CBC, heparin level  4. Coumadin 6mg  po today.   [name], PharmD 12/14/2011, 5:54 AM   Addendum On HF rounds decision was made to stop heparin with INR 1.95 goal 2-3.  Coumadin 7.5 mg ordered x1 home dose 5mg  Mon and Fri and 2.5mg  daily Check INR daily    [name] 12/14/2011

## 2011-12-15 LAB — BASIC METABOLIC PANEL
BUN: 44 mg/dL — ABNORMAL HIGH (ref 6–23)
CO2: 27 mEq/L (ref 19–32)
Calcium: 8.7 mg/dL (ref 8.4–10.5)
Chloride: 96 mEq/L (ref 96–112)
GFR calc non Af Amer: 31 mL/min — ABNORMAL LOW (ref >90)
Glucose, Bld: 184 mg/dL — ABNORMAL HIGH (ref 70–99)
Potassium: 3.8 mEq/L (ref 3.5–5.1)

## 2011-12-15 LAB — CBC
Hemoglobin: 11.1 g/dL — ABNORMAL LOW (ref 13.0–17.0)
MCH: 26.9 pg (ref 26.0–34.0)
MCHC: 31.3 g/dL (ref 30.0–36.0)
MCV: 86.2 fL (ref 78.0–100.0)

## 2011-12-15 LAB — PROTIME-INR: Prothrombin Time: 19.5 seconds — ABNORMAL HIGH (ref 11.6–15.2)

## 2011-12-15 MED ORDER — ISOSORBIDE MONONITRATE ER 30 MG PO TB24
30.0000 mg | ORAL_TABLET | Freq: Every day | ORAL | Status: DC
  Administered 2011-12-15 – 2011-12-18 (×4): 30 mg via ORAL
  Filled 2011-12-15 (×4): qty 1

## 2011-12-15 MED ORDER — HEPARIN SODIUM (PORCINE) 5000 UNIT/ML IJ SOLN
5000.0000 [IU] | Freq: Three times a day (TID) | INTRAMUSCULAR | Status: DC
  Administered 2011-12-15 – 2011-12-17 (×6): 5000 [IU] via SUBCUTANEOUS
  Filled 2011-12-15 (×10): qty 1

## 2011-12-15 MED ORDER — WARFARIN SODIUM 5 MG PO TABS
5.0000 mg | ORAL_TABLET | Freq: Once | ORAL | Status: AC
  Administered 2011-12-15: 5 mg via ORAL
  Filled 2011-12-15: qty 1

## 2011-12-15 MED ORDER — FUROSEMIDE 10 MG/ML IJ SOLN
80.0000 mg | Freq: Two times a day (BID) | INTRAMUSCULAR | Status: DC
  Filled 2011-12-15 (×3): qty 8

## 2011-12-15 MED ORDER — HYDRALAZINE HCL 25 MG PO TABS
12.5000 mg | ORAL_TABLET | Freq: Three times a day (TID) | ORAL | Status: DC
  Administered 2011-12-15: 25 mg via ORAL
  Administered 2011-12-15 – 2011-12-18 (×8): 12.5 mg via ORAL
  Filled 2011-12-15 (×12): qty 0.5

## 2011-12-15 NOTE — Progress Notes (Signed)
ANTICOAGULATION CONSULT NOTE - Follow Up Consult  Pharmacy Consult for Coumadin Indication: Hx of VTE  Allergies  Allergen Reactions  . Levaquin Other (See Comments)    Doesn't tolerate  . Sulfonamide Derivatives Other (See Comments)    unknown  . Cefuroxime Rash    Patient Measurements: Height: 5\' 10"  (177.8 cm) Weight: 223 lb (101.152 kg) (scale A) IBW/kg (Calculated) : 73    Vital Signs: Temp: 98.1 F (36.7 C) (01/12 0520) Temp src: Oral (01/12 0520) BP: 130/68 mmHg (01/12 0520) Pulse Rate: 83  (01/12 0520)  Labs:  Basename 12/15/11 0831 12/15/11 0630 12/14/11 0500 12/13/11 1701  HGB -- 11.1* -- 12.7*  HCT -- 35.5* -- 40.2  PLT -- 319 -- 401*  APTT -- -- -- 38*  LABPROT -- 19.5* 22.6* 23.9*  INR -- 1.62* 1.95* 2.10*  HEPARINUNFRC -- -- -- --  CREATININE 2.06* -- 1.87* 1.98*  CKTOTAL -- -- -- --  CKMB -- -- -- --  TROPONINI -- -- -- --   Estimated Creatinine Clearance: 38.6 ml/min (by C-G formula based on Cr of 2.06).   Medications:  Scheduled:    . allopurinol  200 mg Oral QHS  . aspirin EC  81 mg Oral Daily  . carvedilol  6.25 mg Oral BID WC  . famotidine  20 mg Oral BID  . furosemide  80 mg Intravenous TID  . gabapentin  300 mg Oral BID  . hydrALAZINE  12.5 mg Oral Q8H  . isosorbide mononitrate  30 mg Oral Daily  . omega-3 acid ethyl esters  1 g Oral BID  . potassium chloride  20 mEq Oral BID  . pravastatin  40 mg Oral q1800  . sodium chloride  3 mL Intravenous Q12H  . sodium chloride  3 mL Intravenous Q12H  . warfarin  7.5 mg Oral ONCE-1800  . DISCONTD: furosemide  80 mg Intravenous BID  . DISCONTD: warfarin  6 mg Oral ONCE-1800    Assessment: 71 y.o. male admitted on 12/13/2011, with h/o VTE now s/p right heart cath on 1/11. Home Coumadin regimen of 2.5mg  daily, except 5mg  on Monday and Friday.  INR today of 1.62 subtherapeutic; trend down from yesterday's INR of 1.95. INR on admission 2.10, Coumadin held for right heart cath. Received Coumadin  7.5 mg x1 yesterday. CBC stable.  Goal of Therapy:  INR 2-3   Plan:  1. Coumadin 5mg  PO x1 today 2. Will add heparin 5000 units SQ TID until INR>2 3. Follow up AM labs  Concha Norway 12/15/2011,10:09 AM

## 2011-12-15 NOTE — Progress Notes (Signed)
Advanced Heart Failure Rounding Note   Subjective:    71 y/o male with CHF and iCM EF 20%, CRI and severe MR followed by Dr. Andee Lineman admitted for worsening HF (wt up 227 ->244) admitted for RHC and evaluation for possible advanced therapies.  (Weight down 14 lbs at home since evaluation with Dr. Andee Lineman, currently running 222)  RHC 01/02/2011: RA = 14, RV = 52013-01-31, PA = 53/28 (37), PCW = mean 31 with v waves to 45, Fick cardiac output/index = 4.2/1.9, PVR = 1.7   IV milrinone started post cath.  Beginning to feel better. Ambulating hall without too much difficulty. Weight down 3 pounds - probably not too far from baseline according to previous notes. BMET pending    NO SOB/CP/orthopnea/PND.       Objective:    Vital Signs:   Temp:  [97.5 F (36.4 C)-98.1 F (36.7 C)] 98.1 F (36.7 C) (01/12 0520) Pulse Rate:  [71-83] 83  (01/12 0520) Resp:  [18-20] 18  (01/12 0520) BP: (102-130)/(66-74) 130/68 mmHg (01/12 0520) SpO2:  [95 %-96 %] 96 % (01/12 0520) Weight:  [101.152 kg (223 lb)] 101.152 kg (223 lb) (01/12 0520) Last BM Date: 12/14/11  24-hour weight change: Weight change: -0.748 kg (-1 lb 10.4 oz)  Intake/Output:   Intake/Output Summary (Last 24 hours) at 12/15/11 0801 Last data filed at 12/15/11 0721  Gross per 24 hour  Intake  956.4 ml  Output   2600 ml  Net -1643.6 ml     Physical Exam: General: Well-nourished  Neck: supple, JVP 10. No thyromegaly nonnodular thyroid. Normal carotid upstroke and no carotid bruits  Lungs: clear to auscultation  Cardiac: Regular rate and rhythm with normal S1-S2, 3/6 MR Vascular: 1+ peripheral pitting edema. Dorsalis pedis and posterior tibial pulses are 1+ bilaterally  Skin: Warm and dry  Telemetry: Vpaced with occ PVCs  Labs: Basic Metabolic Panel:  Lab 12/14/11 1610 01-03-2012 1701  NA 143 139  K 4.6 4.4  CL 103 97  CO2 32 34*  GLUCOSE 102* 89  BUN 39* 39*  CREATININE 1.87* 1.98*  CALCIUM 9.1 9.4  MG -- --  PHOS -- --     Liver Function Tests:  Lab 01-03-2012 1701  AST 16  ALT 10  ALKPHOS 95  BILITOT 0.4  PROT 6.9  ALBUMIN 3.4*   No results found for this basename: LIPASE:5,AMYLASE:5 in the last 168 hours No results found for this basename: AMMONIA:3 in the last 168 hours  CBC:  Lab 01-03-2012 1701  WBC 12.8*  NEUTROABS 9.0*  HGB 12.7*  HCT 40.2  MCV 87.0  PLT 401*    Cardiac Enzymes: No results found for this basename: CKTOTAL:5,CKMB:5,CKMBINDEX:5,TROPONINI:5 in the last 168 hours  BNP: No components found with this basename: POCBNP:5  CBG: No results found for this basename: GLUCAP:5 in the last 168 hours  Coagulation Studies:  Basename 12/14/11 0500 01/03/12 1701  LABPROT 22.6* 23.9*  INR 1.95* 2.10*    Other results:   Imaging: X-ray Chest Pa And Lateral   12/14/2011  *RADIOLOGY REPORT*  Clinical Data: Chest pain.  Heart failure.  CHEST - 2 VIEW  Comparison: None.  Findings: 0652 hours. The cardiopericardial silhouette is enlarged. There is pulmonary vascular congestion without overt pulmonary edema.  There is some linear and patchy air density in the left midlung, obscured by the battery pack for the pacer / AICD. Prominent right paratracheal opacity noted.  Probable small left pleural effusion. Bones are diffusely demineralized.  IMPRESSION:  Cardiomegaly with vascular congestion and small left pleural effusion.  Question atelectasis or scarring behind the battery pack for the permanent pacemaker.  As neoplasm cannot be excluded given this appearance, CT chest without contrast is recommended to further evaluate.  Prominent right paratracheal opacity.  This is probably related to ectasia of the arch vessel anatomy, but can also be further assessed at the time of CT.  Original Report Authenticated By: ERIC A. MANSELL, M.D.     Medications:     Scheduled Medications:    . allopurinol  200 mg Oral QHS  . aspirin EC  81 mg Oral Daily  . carvedilol  6.25 mg Oral BID WC  .  famotidine  20 mg Oral BID  . furosemide  80 mg Intravenous TID  . gabapentin  300 mg Oral BID  . omega-3 acid ethyl esters  1 g Oral BID  . potassium chloride  20 mEq Oral BID  . pravastatin  40 mg Oral q1800  . sodium chloride  3 mL Intravenous Q12H  . sodium chloride  3 mL Intravenous Q12H  . warfarin  7.5 mg Oral ONCE-1800  . DISCONTD: furosemide  80 mg Intravenous BID  . DISCONTD: warfarin  6 mg Oral ONCE-1800    Infusions:    . milrinone 0.25 mcg/kg/min (12/14/11 2215)  . 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 Will continue milrinone and lasix. Watch renal fx closely. Add low dose HDL and NTG.  Continue coumadin. Consider inpatient CPX test early next week as part of work-up for advanced therapies. Suspect he may be good VAD candidate. Will need to discuss possibility of coronary angio with Dr. Andee Lineman at some point prior to VAD work-up. However, viability study suggest may not benefit from revasc even if possible and renal function is tenuous.   Length of Stay: 2  12/15/2011, 8:01 AM Truman Hayward 8:01 AM

## 2011-12-16 LAB — CBC
HCT: 35.9 % — ABNORMAL LOW (ref 39.0–52.0)
MCV: 85.5 fL (ref 78.0–100.0)
RBC: 4.2 MIL/uL — ABNORMAL LOW (ref 4.22–5.81)
WBC: 10 10*3/uL (ref 4.0–10.5)

## 2011-12-16 LAB — BASIC METABOLIC PANEL
CO2: 29 mEq/L (ref 19–32)
Chloride: 98 mEq/L (ref 96–112)
Glucose, Bld: 222 mg/dL — ABNORMAL HIGH (ref 70–99)
Potassium: 4.5 mEq/L (ref 3.5–5.1)
Sodium: 138 mEq/L (ref 135–145)

## 2011-12-16 MED ORDER — BUMETANIDE 2 MG PO TABS
2.0000 mg | ORAL_TABLET | Freq: Every day | ORAL | Status: DC
  Administered 2011-12-17 – 2011-12-18 (×2): 2 mg via ORAL
  Filled 2011-12-16 (×2): qty 1

## 2011-12-16 MED ORDER — WARFARIN SODIUM 5 MG PO TABS
5.0000 mg | ORAL_TABLET | Freq: Once | ORAL | Status: AC
  Administered 2011-12-16: 5 mg via ORAL
  Filled 2011-12-16: qty 1

## 2011-12-16 NOTE — Progress Notes (Signed)
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 Home Coumadin regimen of 2.5mg  daily, except 5mg  on Monday and Friday.  INR today just below range at 1.93. INR on admission 2.10, Coumadin held for right heart cath. Received Coumadin 7.5 mg x1 yesterday. CBC stable. SQ heparin was started to cover until INR therapeutic.  Per Dr. Gala Romney, if DVT/PE in past year or previous stroke will need heparin drip to bridge, if not then heparin for DVT ppx. Per pt, no blood clots in past year, says last one in 1993.  Pharmacy System-Based Medication Review: Cardiovascular: CHF EF 20%, severe MR, CAD, mod pulm HTN: s/p RHC milrinone started post-cath. May be a VAD candidate. Meds- ASA 81mg , Coreg, hydralazine, imdur, milrinone 0.125 mcg/kg/min, Lovaza, pravastatin. Weight down 1-2 lbs. Lasix changed back to home bumex  Nephrology: Hx/o CKD stage 4- SCr increased to 2.29-->1.87 (1/11); lytes ok Hematology: CBC ok and stable Home meds: allopurinol 200mg  daily resumed; home meds addressed except ergocalciferol monthly (bumex on hold-receiving furosemide), prednisone on hold (prn) Best practices: Coumadin, famotidine PO   Goal of Therapy:  INR 2-3   Plan:  Coumadin 5mg  PO x1 today. Check INR in AM. Discontinue SQ heparin once INR therapeutic.  [name]. Saul Fordyce, PharmD  12/16/2011 11:42 AM    Allergies  Allergen Reactions  . Levaquin Other (See Comments)    Doesn't tolerate  . Sulfonamide Derivatives Other (See Comments)    unknown  . Cefuroxime Rash    Patient Measurements: Height: 5\' 10"  (177.8 cm) Weight: 224 lb 3.2 oz (101.696 kg) (scale a) IBW/kg (Calculated) : 73    Vital Signs: Temp: 97.8 F (36.6 C) (01/13 0512) BP: 106/70 mmHg (01/13 0512) Pulse Rate: 82  (01/13 0512)  Labs:  Basename 12/16/11 0850 12/16/11 0652 12/15/11 0831 12/15/11 0630  12/14/11 0500 12/13/11 1701  HGB -- 11.3* -- 11.1* -- --  HCT -- 35.9* -- 35.5* -- 40.2  PLT -- 325 -- 319 -- 401*  APTT -- -- -- -- -- 38*  LABPROT -- 22.4* -- 19.5* 22.6* --  INR -- 1.93* -- 1.62* 1.95* --  HEPARINUNFRC -- -- -- -- -- --  CREATININE 2.29* -- 2.06* -- 1.87* --  CKTOTAL -- -- -- -- -- --  CKMB -- -- -- -- -- --  TROPONINI -- -- -- -- -- --   Estimated Creatinine Clearance: 34.8 ml/min (by C-G formula based on Cr of 2.29).   Medications:  Scheduled:     . allopurinol  200 mg Oral QHS  . aspirin EC  81 mg Oral Daily  . bumetanide  2 mg Oral Daily  . carvedilol  6.25 mg Oral BID WC  . famotidine  20 mg Oral BID  . gabapentin  300 mg Oral BID  . heparin subcutaneous  5,000 Units Subcutaneous Q8H  . hydrALAZINE  12.5 mg Oral Q8H  . isosorbide mononitrate  30 mg Oral Daily  . omega-3 acid ethyl esters  1 g Oral BID  . pravastatin  40 mg Oral q1800  . sodium chloride  3 mL Intravenous Q12H  . sodium chloride  3 mL Intravenous Q12H  . warfarin  5 mg Oral ONCE-1800  . DISCONTD: furosemide  80 mg Intravenous TID  . DISCONTD: furosemide  80 mg Intravenous BID  . DISCONTD: potassium chloride  20 mEq Oral BID

## 2011-12-16 NOTE — Progress Notes (Signed)
Advanced Heart Failure Rounding Note   Subjective:    71 y/o male with CHF and iCM EF 20%, CRI and severe MR followed by Dr. Andee Lineman admitted for worsening HF (wt up 227 ->244) admitted for RHC and evaluation for possible advanced therapies.  (Weight down 14 lbs at home since evaluation with Dr. Andee Lineman, currently running 222)  RHC 12/12/10: RA = 14, RV = 51/10/13, PA = 53/28 (37), PCW = mean 31 with v waves to 45, Fick cardiac output/index = 4.2/1.9, PVR = 1.7   IV milrinone started post cath.  Beginning to feel better. Ambulating hall without too much difficulty. Weight down 1-2 pounds - probably not too far from baseline according to previous notes. BMET pending today. Cr 1.87->2.06 yesterday so lasix cut back.     No SOB/CP/orthopnea/PND.    HDL/NTG added yesterday.   Objective:    Vital Signs:   Temp:  [97.4 F (36.3 C)-97.8 F (36.6 C)] 97.8 F (36.6 C) (01/13 0512) Pulse Rate:  [82-89] 82  (01/13 0512) Resp:  [18-20] 20  (01/13 0512) BP: (103-131)/(64-76) 106/70 mmHg (01/13 0512) SpO2:  [94 %-96 %] 94 % (01/13 0512) Weight:  [101.696 kg (224 lb 3.2 oz)] 101.696 kg (224 lb 3.2 oz) (01/13 0512) Last BM Date: 12/15/11  24-hour weight change: Weight change: 0.544 kg (1 lb 3.2 oz)  Intake/Output:   Intake/Output Summary (Last 24 hours) at 12/16/11 0732 Last data filed at 12/16/11 0600  Gross per 24 hour  Intake 1178.55 ml  Output   1350 ml  Net -171.45 ml     Physical Exam: General: Well-nourished  Neck: supple, JVP 10. No thyromegaly nonnodular thyroid. Normal carotid upstroke and no carotid bruits  Lungs: clear to auscultation  Cardiac: Regular rate and rhythm with normal S1-S2, 3/6 MR Vascular: 1+ peripheral pitting edema. Dorsalis pedis and posterior tibial pulses are 1+ bilaterally  Skin: Warm and dry  Telemetry: Vpaced with occ PVCs  Labs: Basic Metabolic Panel:  Lab 12/15/11 1610 12/15/11 0500 12/13/11 1701  NA 134* 143 139  K 3.8 4.6 4.4  CL 96 103 97    CO2 27 32 34*  GLUCOSE 184* 102* 89  BUN 44* 39* 39*  CREATININE 2.06* 1.87* 1.98*  CALCIUM 8.7 9.1 9.4  MG -- -- --  PHOS -- -- --    Liver Function Tests:  Lab 12/13/11 1701  AST 16  ALT 10  ALKPHOS 95  BILITOT 0.4  PROT 6.9  ALBUMIN 3.4*   No results found for this basename: LIPASE:5,AMYLASE:5 in the last 168 hours No results found for this basename: AMMONIA:3 in the last 168 hours  CBC:  Lab 12/15/11 0630 12/13/11 1701  WBC 8.3 12.8*  NEUTROABS -- 9.0*  HGB 11.1* 12.7*  HCT 35.5* 40.2  MCV 86.2 87.0  PLT 319 401*    Cardiac Enzymes: No results found for this basename: CKTOTAL:5,CKMB:5,CKMBINDEX:5,TROPONINI:5 in the last 168 hours  BNP: No components found with this basename: POCBNP:5  CBG: No results found for this basename: GLUCAP:5 in the last 168 hours  Coagulation Studies:  Basename 12/15/11 0630 12/15/11 0500 12/13/11 1701  LABPROT 19.5* 22.6* 23.9*  INR 1.62* 1.95* 2.10*    Other results:   Imaging: No results found.   Medications:     Scheduled Medications:    . allopurinol  200 mg Oral QHS  . aspirin EC  81 mg Oral Daily  . carvedilol  6.25 mg Oral BID WC  . famotidine  20 mg  Oral BID  . furosemide  80 mg Intravenous BID  . gabapentin  300 mg Oral BID  . heparin subcutaneous  5,000 Units Subcutaneous Q8H  . hydrALAZINE  12.5 mg Oral Q8H  . isosorbide mononitrate  30 mg Oral Daily  . omega-3 acid ethyl esters  1 g Oral BID  . potassium chloride  20 mEq Oral BID  . pravastatin  40 mg Oral q1800  . sodium chloride  3 mL Intravenous Q12H  . sodium chloride  3 mL Intravenous Q12H  . warfarin  5 mg Oral ONCE-1800  . DISCONTD: furosemide  80 mg Intravenous TID    Infusions:    . 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 Weight at baseline. Will cut milrinone to 0.125. Switch back to home bumex.   Consider inpatient CPX test early next week as part of work-up for advanced therapies once off of milrinone. Suspect he may be good VAD candidate. Will need to discuss possibility of coronary angio with Dr. Andee Lineman at some point prior to VAD work-up. However, viability study suggest may not benefit from revasc even if possible and renal function is tenuous.   Length of Stay: 3  12/16/2011, 7:32 AM Truman Hayward 7:32 AM

## 2011-12-17 ENCOUNTER — Other Ambulatory Visit (HOSPITAL_COMMUNITY): Payer: Self-pay | Admitting: Adult Health

## 2011-12-17 DIAGNOSIS — I5022 Chronic systolic (congestive) heart failure: Secondary | ICD-10-CM

## 2011-12-17 DIAGNOSIS — I2589 Other forms of chronic ischemic heart disease: Secondary | ICD-10-CM

## 2011-12-17 LAB — CBC
Hemoglobin: 10.5 g/dL — ABNORMAL LOW (ref 13.0–17.0)
MCH: 27.2 pg (ref 26.0–34.0)
MCV: 85 fL (ref 78.0–100.0)
RBC: 3.86 MIL/uL — ABNORMAL LOW (ref 4.22–5.81)

## 2011-12-17 LAB — ANTITHROMBIN III: AntiThromb III Func: 73 % — ABNORMAL LOW (ref 75–120)

## 2011-12-17 MED ORDER — WARFARIN SODIUM 2.5 MG PO TABS
2.5000 mg | ORAL_TABLET | Freq: Once | ORAL | Status: AC
  Administered 2011-12-17: 2.5 mg via ORAL
  Filled 2011-12-17: qty 1

## 2011-12-17 NOTE — Consult Note (Signed)
[address]            [place] [address]          [phone]       [facility] Medical Record #[id number] Date of Birth: [birth year]  Referring: No ref. provider found Primary Care: VYAS,DHRUV B., MD, MD  Chief Complaint:   Progressive dyspnea on exertion with declined and exercise tolerance over the past 6 months  History of Present Illness:      [name] is a 71 year old Caucasian male nondiabetic ex-smoker who was admitted to the hospital for evaluation of advanced heart failure. The patient has a diagnosis of ischemic cardiomyopathy and has had previous myocardial infarction and coronary PCI 10-15 years previously. He is been in atrial fibrillation on chronic Coumadin for approximately 15-20 years. He was last admitted for CHF when visiting relatives one year ago in [place] and was admitted [facility]. At that time he had heart block and arrhythmias and required placement of a biventricular pacer-defibrillator. He states he has not been  admitted to the hospital since then for heart failure. However his exercise tolerance is very poor and his progressively declined. He recently had a 2-D echo at the [facility] office showing moderate to severe mitral regurgitation with left and good dilatation and EF 25-30%. There is a reported viability study which showed little if any myocardial viability. Patient is on chronic Coumadin for his atrial fibrillation. The patient had a right heart cath at this admission demonstrating elevated PA pressures of 55/25 cardiac index of 1.9 CVP of 20 and his creatinine is 1.9. The patient has not had previous heart surgery. His AICD has not discharged.  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 The patient was evaluated by Dr. Justine Null  for a hypercoagulable state but the records from that evaluation are pending. The patient's functional status before his fairly acute decline was good. He enjoys doing yard work exercise machines and travel. He is retired from [employer], [employer].   Current Activity/ Functional Status: Good. No memory problems, no swallowing difficulty.     Past Surgical History  Procedure Date  . Biv 11/2010    ICD 12/11 MEDTRONIC  . Cataract extraction w/ intraocular lens  implant, bilateral 1990's  . Exploratory laparotomy w/ bowel resection 1993  . Tracheostomy 1993  . Coronary angioplasty 1988  . Coronary angioplasty with stent placement 2003    "1"  . Cardiac catheterization 12/13/11    History  Smoking status  . Former Smoker -- 1.0 packs/day for 30 years  . Types: Cigarettes  . Quit date: 12/03/1986  Smokeless tobacco  . Never Used    History  Alcohol Use No    History   Social History  . Marital Status: Married    Spouse Name: N/A    Number of Children: N/A  . Years of Education: N/A   Occupational History  . Not on file.   Social History Main Topics  . Smoking status: Former Smoker -- 1.0 packs/day for 30 years    Types: Cigarettes  Quit date: 12/03/1986  . Smokeless tobacco: Never Used  . Alcohol Use: No  . Drug Use: No  . Sexually Active: No   Other Topics Concern  . Not on file   Social History Narrative  . No narrative on file    Allergies  Allergen Reactions  . Levaquin Other (See Comments)    Doesn't tolerate  . Sulfonamide Derivatives Other (See Comments)    unknown  . Cefuroxime Rash    Current Facility-Administered Medications  Medication Dose Route Frequency Provider Last Rate Last Dose  . 0.9 %  sodium chloride  infusion  250 mL Intravenous PRN Peyton Bottoms, MD 10 mL/hr at 12/17/11 0700 250 mL at 12/17/11 0700  . 0.9 %  sodium chloride infusion  250 mL Intravenous PRN Hadassah Pais, PA      . acetaminophen (TYLENOL) tablet 650 mg  650 mg Oral Q4H PRN Dolores Patty, MD   650 mg at 12/17/11 0428  . allopurinol (ZYLOPRIM) tablet 200 mg  200 mg Oral QHS Dolores Patty, MD   200 mg at 12/16/11 2212  . alum & mag hydroxide-simeth (MAALOX/MYLANTA) 200-200-20 MG/5ML suspension 30 mL  30 mL Oral Q6H PRN Peyton Bottoms, MD      . aspirin EC tablet 81 mg  81 mg Oral Daily Arman Filter, RPH   81 mg at 12/17/11 1027  . bumetanide (BUMEX) tablet 2 mg  2 mg Oral Daily Dolores Patty, MD   2 mg at 12/17/11 1027  . carvedilol (COREG) tablet 6.25 mg  6.25 mg Oral BID WC Dolores Patty, MD   6.25 mg at 12/17/11 1620  . famotidine (PEPCID) tablet 20 mg  20 mg Oral BID Dolores Patty, MD   20 mg at 12/17/11 1027  . gabapentin (NEURONTIN) capsule 300 mg  300 mg Oral BID Dolores Patty, MD   300 mg at 12/17/11 1027  . hydrALAZINE (APRESOLINE) tablet 12.5 mg  12.5 mg Oral Q8H Dolores Patty, MD   12.5 mg at 12/17/11 1721  . isosorbide mononitrate (IMDUR) 24 hr tablet 30 mg  30 mg Oral Daily Dolores Patty, MD   30 mg at 12/17/11 1027  . LORazepam (ATIVAN) tablet 1 mg  1 mg Oral QHS PRN Dolores Patty, MD   1 mg at 12/14/11 2144  . omega-3 acid ethyl esters (LOVAZA) capsule 1 g  1 g Oral BID Peyton Bottoms, MD   1 g at 12/17/11 1027  . ondansetron (ZOFRAN) injection 4 mg  4 mg Intravenous Q6H PRN Dolores Patty, MD      . pravastatin (PRAVACHOL) tablet 40 mg  40 mg Oral q1800 Peyton Bottoms, MD   40 mg at 12/17/11 1721  . sodium chloride 0.9 % injection 3 mL  3 mL Intravenous Q12H Peyton Bottoms, MD   3 mL at 12/15/11 0950  . sodium chloride 0.9 % injection 3 mL  3 mL Intravenous Q12H Hadassah Pais, PA   3 mL at 12/16/11 2217  . sodium chloride 0.9 % injection 3 mL  3 mL Intravenous PRN  Hadassah Pais, PA      . warfarin (COUMADIN) tablet 2.5 mg  2.5 mg Oral ONCE-1800 [address], [facility]   2.5 mg at 12/17/11 1721  . zolpidem (AMBIEN) tablet 10 mg  10 mg Oral QHS PRN Peyton Bottoms, MD      . DISCONTD: heparin injection 5,000 Units  5,000 Units Subcutaneous Q8H Dolores Patty, MD   5,000 Units at 12/17/11 [phone]  . DISCONTD: milrinone (PRIMACOR) infusion 200 mcg/mL (0.2 mg/mL)  0.125 mcg/kg/min Intravenous Continuous Dolores Patty, MD   0.125 mcg/kg/min at 12/16/11 2229    Prescriptions prior to admission  Medication Sig Dispense Refill  . acetaminophen (TYLENOL) 500 MG tablet Take 500 mg by mouth 2 (two) times daily as needed. For pain      . allopurinol (ZYLOPRIM) 100 MG tablet Take 200 mg by mouth at bedtime.      [name] aluminum acetate (A-MANTLE) CREA Apply 1 application topically daily as needed. For dry hands and feet      . aspirin 81 MG tablet Take 81 mg by mouth daily.        . bumetanide (BUMEX) 2 MG tablet Take 2 mg by mouth daily.      . carvedilol (COREG) 6.25 MG tablet Take 6.25 mg by mouth 2 (two) times daily with a meal.       . ergocalciferol (VITAMIN D2) 50000 UNITS capsule Take 50,000 Units by mouth once a week. Take on Thursdays      . fish oil-omega-3 fatty acids 1000 MG capsule Take 1 g by mouth daily.       [name] gabapentin (NEURONTIN) 300 MG capsule Take 300 mg by mouth 2 (two) times daily.       [name] LORazepam (ATIVAN) 1 MG tablet Take 1 mg by mouth at bedtime as needed. For anxiety      . pravastatin (PRAVACHOL) 40 MG tablet Take 40 mg by mouth daily.       . predniSONE (DELTASONE) 20 MG tablet Take 20 mg by mouth daily as needed. Take for gout flare ups      . ranitidine (ZANTAC) 150 MG capsule Take 150 mg by mouth 2 (two) times daily.        . Sulfacetamide Sodium-Sulfur (SUPHERA) 10-5 % CREA Apply 1 application topically daily.       [name] warfarin (COUMADIN) 5 MG tablet Take 2.5-5 mg by mouth daily. Take 5mg  on Mondays and Fridays.   Take 2.5mg  on all other days.        History reviewed. No pertinent family history.   Review of Systems:     Cardiac Review of Systems: Y or N  Chest Pain [ n   ]  Resting SOB [   ] Exertional SOB  Cove.Etienne   ]  Orthopnea Cove.Etienne  ]   Pedal Edema [  y    Palpitations [  ] Syncope  Milo.Brash  ]   Presyncope [n   ]  General Review of Systems: [Y] = yes [  ]=no Constitional: recent weight change [  n]; anorexia [n  ]; fatigue yn[  ]; nausea [ n ]; night sweats [n  ]; fever [  ]; or chills [ n];  Dental: poor dentition[  ]; Last Dentist visit:   Eye : blurred vision [n  ]; diplopia [   ]; vision changes [n];  Amaurosis fugax[  ]; Resp: cough [  ];  wheezing[  ];  hemoptysis[ n ]; shortness of breath[  y]; paroxysmal nocturnal dyspnea[y  ]; dyspnea on exertion[ y ]; or orthopnea[  ];  GI:  gallstones[n  ], vomiting[  ];  dysphagia[  n]; melena[  ];  hematochezia [ n ]; heartburn[  ];   Hx of  Colonoscopy[  ]; GU: kidney stones [ n ]; hematuria[  ];   dysuria [  ];  nocturia[  ];  history of     obstruction [  ];             Skin: rash, swelling[  ];, hair loss[  ];  peripheral edema[  ];  or itching[  ]; Musculosketetal: myalgiasn[  ];  joint swelling[  n];  joint erythema[  ];  joint pain[  ];  back pain[  ];  Heme/Lymph: bruising[  ];  bleeding[n  ];  anemia[n  ];  Neuro: TIA[  ];  headaches[n  ];  strokey[ y ];  vertigo[  ];  seizures[n  ];   paresthesias[  ];  difficulty walking[  ];  Psych:depression[n  ]; Kendell Bane  ];  Endocrine: diabetes[  ];  thyroid dysfunction[n  ];  Immunizations: Flu [  ]; Pneumococcal[  ];  Other:  Physical Exam: BP 116/59  Pulse 69  Temp(Src) 97.3 F (36.3 C) (Oral)  Resp 20  Ht 5\' 10"  (1.778 m)  Wt 225 lb 9.6 oz (102.331 kg)  BMI 32.37 kg/m2  SpO2 97%  Physical exam General appearance pleasant 71 year old Caucasian male ambulating in  room in no distress HEENT bilateral cataract surgery dentition good normocephalic Neck no JVD or bruit Lymphatics none palpable adenopathy Thorax well-healed left AICD pocket, clear breath sounds no deformity Cardiac atrial for ablation 2/6 holosystolic murmur Abdomen well-healed midline laparotomy scar no pulsatile mass Extremities no clubbing cyanosis edema or tenderness Neurologic alert oriented no focal motor deficit normal gait   Diagnostic Studies & Laboratory data:   Right heart Date are reviewed as noted exercise physiology testing pending report of 2-D echo performed at [facility] hospital reviewed chest x-ray taken in this addition reviewed. Hypercoagulable panel pending  Recent Radiology Findings:   No results found.    Recent Lab Findings: Lab Results  Component Value Date   WBC 10.4 12/17/2011   HGB 10.5* 12/17/2011   HCT 32.8* 12/17/2011   PLT 280 12/17/2011   GLUCOSE 222* 12/16/2011   ALT 10 12/13/2011   AST 16 12/13/2011   NA 138 12/16/2011   K 4.5 12/16/2011   CL 98 12/16/2011   CREATININE 2.29* 12/16/2011   BUN 51* 12/16/2011   CO2 29 12/16/2011   TSH 2.934 12/13/2011   INR 2.35* 12/17/2011      Assessment / Plan:     Severe ischemic cardiomyopathy class III to class for CHF with severe ischemic mitral regurgitation and poor myocardial viability. Although the patient has not been admitted for heart failure in the past year his exercise tolerance is clearly on the decline. I doubt that mitral valve repair-replacement would benefit the patient due to his severe ventricular dysfunction. He appears to be an acceptable candidate for evaluation for destination elevated therapy however his history of thromboembolic events(posterior basilar stroke, mesentery venous thrombosis, pulmonary embolus) would be of concern with implantation of a  mechanical heart pump. However on Coumadin for the past 20 years he has been free of thromboembolic problems. We'll try to obtain the hematology  evaluation by Dr. Cephas Darby. Will follow.       @[email or web address] @ 12/17/2011 6:17 PM

## 2011-12-17 NOTE — Progress Notes (Signed)
Inpatient Diabetes Program Recommendations  AACE/ADA: New Consensus Statement on Inpatient Glycemic Control (2009)  Target Ranges:  Prepandial:   less than 140 mg/dL      Peak postprandial:   less than 180 mg/dL (1-2 hours)      Critically ill patients:  140 - 180 mg/dL   Reason for Visit: H & P documents history of diabetes mellitus. No home meds for diabetes documented.  Inpatient Diabetes Program Recommendations Correction (SSI): Please check cbg's and start with sensitive correction tidwc as needed. Diet: Pt has documented history of diabetes. Please add carbohydrate modified to diet orders.  Note: May want to check HgBA1C as well.   Thank you, Lenor Coffin, RN, CNS, Diabetes Coordinator [phone])

## 2011-12-17 NOTE — Progress Notes (Signed)
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 Home Coumadin regimen of 2.5mg  daily, except 5mg  on Monday and Friday. Per pt, no blood clots in past year, says last one in 1993.  Pharmacy System-Based Medication Review: Anticoagulation:  H/o VTE, INR 2.35 no bleeding noted, has required more than home dose to get therapeutic. Will give 2.5 mg today and follow trend. DC SQ heparin Cardiovascular: CHF EF 20%, severe MR, CAD, mod pulm HTN: s/p RHC milrinone started post-cath. May be a VAD candidate. Meds- ASA 81mg , Coreg, hydralazine, imdur, milrinone 0.125 mcg/kg/min, Lovaza, pravastatin, bumex 2 mg daily. Ongoing evaluation for advanced therapies. Nephrology: Hx/o CKD stage 4, SCr 2.29 up from 1.87 on admission Home meds: allopurinol 200mg  daily resumed; home meds addressed except ergocalciferol monthly  Best practices: Coumadin, famotidine PO   Goal of Therapy:  INR 2-3   Plan:  Coumadin 2.5mg  PO x1 today.  Check INR in AM. Discontinue SQ heparin.   Allergies  Allergen Reactions  . Levaquin Other (See Comments)    Doesn't tolerate  . Sulfonamide Derivatives Other (See Comments)    unknown  . Cefuroxime Rash    Patient Measurements: Height: 5\' 10"  (177.8 cm) Weight: 225 lb 9.6 oz (102.331 kg) (scale a) IBW/kg (Calculated) : 73    Vital Signs: Temp: 97.9 F (36.6 C) (01/14 0415) Temp src: Oral (01/14 0415) BP: 113/69 mmHg (01/14 0415) Pulse Rate: 100  (01/14 0415)  Labs:  [name] 12/17/11 0546 12/16/11 0850 12/16/11 0652 12/15/11 0831 12/15/11 0630  HGB 10.5* -- 11.3* -- --  HCT 32.8* -- 35.9* -- 35.5*  PLT 280 -- 325 -- 319  APTT -- -- -- -- --  LABPROT 26.1* -- 22.4* -- 19.5*  INR 2.35* -- 1.93* -- 1.62*  HEPARINUNFRC -- -- -- -- --  CREATININE -- 2.29* -- 2.06* --  CKTOTAL -- -- -- -- --  CKMB -- -- -- -- --  TROPONINI --  -- -- -- --   Estimated Creatinine Clearance: 34.9 ml/min (by C-G formula based on Cr of 2.29).   Medications:  Scheduled:     . allopurinol  200 mg Oral QHS  . aspirin EC  81 mg Oral Daily  . bumetanide  2 mg Oral Daily  . carvedilol  6.25 mg Oral BID WC  . famotidine  20 mg Oral BID  . gabapentin  300 mg Oral BID  . heparin subcutaneous  5,000 Units Subcutaneous Q8H  . hydrALAZINE  12.5 mg Oral Q8H  . isosorbide mononitrate  30 mg Oral Daily  . omega-3 acid ethyl esters  1 g Oral BID  . pravastatin  40 mg Oral q1800  . sodium chloride  3 mL Intravenous Q12H  . sodium chloride  3 mL Intravenous Q12H  . warfarin  5 mg Oral ONCE-1800   [name] Pharm.D., BCPS 12/17/2011 11:02 AM [phone]

## 2011-12-17 NOTE — Progress Notes (Signed)
Subjective:  71 y/o male with CHF and iCM EF 20%, CRI and severe MR followed by Dr. Andee Lineman admitted for worsening HF (wt up 227 ->244) admitted for RHC and evaluation for possible advanced therapies. (Weight down 14 lbs at home since evaluation with Dr. Andee Lineman, currently running 222) . On chronic Coumadin for history DVT/PE. Intolerant ACE/ARB due to renal insufficiency.  RHC 12/12/10: RA = 14, RV = 51/10/13, PA = 53/28 (37), PCW = mean 31 with v waves to 45, Fick cardiac output/index = 4.2/1.9, PVR = 1.7   IV milrinone started post cath 1/10. Diuresed only mildly. Cr bumped 1.87>2.06>2.29. IV lasix held.  Bumex resumed yesterday. Milrinone decreased yesterday to 0.125 which has tolerated well.   Feels ok. Denies SOB/PND/Orhtopnea/CP. Ambulating in halls without SOB. 4 beats NSVT this am.      Intake/Output Summary (Last 24 hours) at 12/17/11 1249 Last data filed at 12/17/11 1246  Gross per 24 hour  Intake 1319.34 ml  Output   1175 ml  Net 144.34 ml    Current meds:    . allopurinol  200 mg Oral QHS  . aspirin EC  81 mg Oral Daily  . bumetanide  2 mg Oral Daily  . carvedilol  6.25 mg Oral BID WC  . famotidine  20 mg Oral BID  . gabapentin  300 mg Oral BID  . hydrALAZINE  12.5 mg Oral Q8H  . isosorbide mononitrate  30 mg Oral Daily  . omega-3 acid ethyl esters  1 g Oral BID  . pravastatin  40 mg Oral q1800  . sodium chloride  3 mL Intravenous Q12H  . sodium chloride  3 mL Intravenous Q12H  . warfarin  2.5 mg Oral ONCE-1800  . warfarin  5 mg Oral ONCE-1800  . DISCONTD: heparin subcutaneous  5,000 Units Subcutaneous Q8H   Infusions:    . milrinone 0.125 mcg/kg/min (12/16/11 2229)     Objective:  Blood pressure 113/69, pulse 100, temperature 97.9 F (36.6 C), temperature source Oral, resp. rate 20, height 5\' 10"  (1.778 m), weight 102.331 kg (225 lb 9.6 oz), SpO2 95.00%. Weight change: 0.635 kg (1 lb 6.4 oz)  Physical Exam: General:  Well appearing. No resp  difficulty HEENT: normal Neck: supple. JVP 8-9. Carotids 2+ bilat; no bruits. No lymphadenopathy or thryomegaly appreciated. Cor: PMI nondisplaced. Regular rate & rhythm. No rubs, gallops or murmurs. Lungs: clear Abdomen: soft, nontender, nondistended. No hepatosplenomegaly. No bruits or masses. Good bowel sounds. Extremities: no cyanosis, clubbing, rash, trace edema Neuro: alert & orientedx3, cranial nerves grossly intact. moves all 4 extremities w/o difficulty. Affect pleasant  Telemetry: V-Paced NSVT   Lab Results: Basic Metabolic Panel:  Lab 12/16/11 9147 12/15/11 0831 12/14/11 0500 12/13/11 1701  NA 138 134* 143 139  K 4.5 3.8 -- --  CL 98 96 103 97  CO2 29 27 32 34*  GLUCOSE 222* 184* 102* 89  BUN 51* 44* 39* 39*  CREATININE 2.29* 2.06* 1.87* 1.98*  CALCIUM 9.2 8.7 9.1 9.4  MG -- -- -- --  PHOS -- -- -- --   Liver Function Tests:  Lab 12/13/11 1701  AST 16  ALT 10  ALKPHOS 95  BILITOT 0.4  PROT 6.9  ALBUMIN 3.4*   No results found for this basename: LIPASE:5,AMYLASE:5 in the last 168 hours No results found for this basename: AMMONIA:5 in the last 168 hours CBC:  Lab 12/17/11 0546 12/16/11 0652 12/15/11 0630 12/13/11 1701  WBC 10.4 10.0 8.3 12.8*  NEUTROABS -- -- -- 9.0*  HGB 10.5* 11.3* 11.1* 12.7*  HCT 32.8* 35.9* 35.5* 40.2  MCV 85.0 85.5 86.2 87.0  PLT 280 325 319 401*   Cardiac Enzymes: No results found for this basename: CKTOTAL:5,CKMB:5,CKMBINDEX:5,TROPONINI:5 in the last 168 hours BNP: No components found with this basename: POCBNP:5 CBG: No results found for this basename: GLUCAP:5 in the last 168 hours Microbiology: No results found for this basename: cult   No results found for this basename: CULT:2,SDES:2 in the last 168 hours  Imaging: No results found.   ASSESSMENT:  1) Acute on chronic systolic CHF with markedly elevated L-sided filling pressures  2) Low normal cardiac output  3) Moderate pulmonary venous HTN  4) Severe mitral  regurgitation  5) CAD  6) ICM EF 20%  7) Chronic renal failure, stage 4  8) NSVT    PLAN/DISCUSSION: Doing well without dyspnea. Will stop Milrinone. Volume status stable. Plan for CPX tomorrow if exercise physiologist available.   Suspect he may be good VAD candidate if CR OK. Will need to discuss possibility of coronary angio with Dr. Andee Lineman at some point prior to VAD work-up. However, viability study suggest may not benefit from revasc even if possible and renal function is tenuous.    LOS: 4 days    Arvilla Meres, MD 12/17/2011, 12:49 PM

## 2011-12-18 ENCOUNTER — Other Ambulatory Visit (HOSPITAL_COMMUNITY): Payer: Self-pay

## 2011-12-18 ENCOUNTER — Encounter: Payer: Medicare Other | Admitting: *Deleted

## 2011-12-18 DIAGNOSIS — I5022 Chronic systolic (congestive) heart failure: Secondary | ICD-10-CM

## 2011-12-18 LAB — LUPUS ANTICOAGULANT PANEL
DRVVT: 56.3 secs — ABNORMAL HIGH (ref 34.1–42.2)
Lupus Anticoagulant: NOT DETECTED
PTT Lupus Anticoagulant: 55.4 secs — ABNORMAL HIGH (ref 28.0–43.0)
PTTLA 4:1 Mix: 47.4 secs — ABNORMAL HIGH (ref 28.0–43.0)
PTTLA Confirmation: 0 secs (ref <8.0)
dRVVT Incubated 1:1 Mix: 36.8 secs (ref 34.1–42.2)

## 2011-12-18 LAB — BASIC METABOLIC PANEL
BUN: 44 mg/dL — ABNORMAL HIGH (ref 6–23)
Chloride: 100 mEq/L (ref 96–112)
GFR calc Af Amer: 38 mL/min — ABNORMAL LOW (ref >90)
Glucose, Bld: 100 mg/dL — ABNORMAL HIGH (ref 70–99)
Potassium: 4 mEq/L (ref 3.5–5.1)
Sodium: 139 mEq/L (ref 135–145)

## 2011-12-18 LAB — CBC
Hemoglobin: 10.7 g/dL — ABNORMAL LOW (ref 13.0–17.0)
MCH: 26.6 pg (ref 26.0–34.0)
MCV: 86.4 fL (ref 78.0–100.0)
RBC: 4.03 MIL/uL — ABNORMAL LOW (ref 4.22–5.81)

## 2011-12-18 LAB — CARDIOLIPIN ANTIBODIES, IGG, IGM, IGA
Anticardiolipin IgA: 5 APL U/mL — ABNORMAL LOW (ref <22)
Anticardiolipin IgG: 8 GPL U/mL — ABNORMAL LOW (ref <23)
Anticardiolipin IgM: 4 MPL U/mL — ABNORMAL LOW (ref <11)

## 2011-12-18 LAB — BETA-2-GLYCOPROTEIN I ABS, IGG/M/A
Beta-2 Glyco I IgG: 0 G Units (ref <20)
Beta-2-Glycoprotein I IgA: 2 A Units (ref <20)
Beta-2-Glycoprotein I IgM: 3 M Units (ref <20)

## 2011-12-18 LAB — PROTIME-INR: Prothrombin Time: 31.9 seconds — ABNORMAL HIGH (ref 11.6–15.2)

## 2011-12-18 LAB — PROTEIN C ACTIVITY: Protein C Activity: <15 % (ref 75–133)

## 2011-12-18 LAB — PROTEIN S ACTIVITY: Protein S Activity: <15 % (ref 69–129)

## 2011-12-18 LAB — FACTOR 5 LEIDEN

## 2011-12-18 LAB — HOMOCYSTEINE: Homocysteine: 21.3 umol/L — ABNORMAL HIGH (ref 4.0–15.4)

## 2011-12-18 MED ORDER — ISOSORBIDE MONONITRATE ER 30 MG PO TB24: 30.0000 mg | ORAL_TABLET | Freq: Every day | ORAL | Status: DC

## 2011-12-18 MED ORDER — HYDRALAZINE HCL 25 MG PO TABS: 12.5000 mg | ORAL_TABLET | Freq: Three times a day (TID) | ORAL | Status: DC

## 2011-12-18 NOTE — Progress Notes (Signed)
ANTICOAGULATION CONSULT NOTE - Follow Up Consult  Pharmacy Consult for Coumadin Indication: Hx of VTE   Assessment: 71 y.o. male admitted with h/o VTE now s/p right heart cath on 1/11. Home Coumadin regimen of 2.5mg  daily, except 5mg  on Monday and Friday. Per pt, no blood clots in past year, says last one in 1993.   Pharmacy System-Based Medication Review: Anticoagulation: H/o VTE, INR 3.03 no bleeding noted, has required more than home dose to get therapeutic; now with quick INR rise. Will give 2.5 mg today and follow trend. Had hypercoag w/u with [place], results pending.   Cardiovascular: CHF EF 20%, severe MR, CAD, mod pulm HTN: s/p RHC milrinone started post-cath. May be a VAD candidate, but hx VTEs of concern. Meds- ASA 81mg , Coreg, hydralazine, imdur, now off milrinone, Lovaza, pravastatin, bumex 2 mg daily. No ACE due to renal insufficiency. Ongoing evaluation for advanced therapies.  BP 122/71; HR 66; Wt essentially unchanged from admit Nephrology: Hx/o CKD stage 4, Scr dec to 1.93 today, CrCl ~ 40 ml/min; I/O = 927/2105;  UOP = 0.9 ml/kg/hr. Endocrine: TCTS note says non-diabetic; but has hx DM, no meds PTA.   Home meds: allopurinol 200mg  daily resumed; home meds addressed except ergocalciferol monthly  Best practices: Coumadin, famotidine PO  Goal of Therapy:  INR 2-3  Plan:  Hold Coumadin for today, can likely resume 2.5 mg tomorrow.  Check INR in AM.   Allergies  Allergen Reactions  . Levaquin Other (See Comments)    Doesn't tolerate  . Sulfonamide Derivatives Other (See Comments)    unknown  . Cefuroxime Rash    Patient Measurements: Height: 5\' 10"  (177.8 cm) Weight: 226 lb 6.6 oz (102.7 kg) (a scale) IBW/kg (Calculated) : 73    Vital Signs: Temp: 97.5 F (36.4 C) (01/15 0532) BP: 122/71 mmHg (01/15 0532) Pulse Rate: 66  (01/15 0532)  Labs:  Basename 12/18/11 0620 12/17/11 0546 12/16/11 0850 12/16/11 0652  HGB 10.7* 10.5* -- --  HCT 34.8* 32.8* --  35.9*  PLT 276 280 -- 325  APTT -- -- -- --  LABPROT 31.9* 26.1* -- 22.4*  INR 3.03* 2.35* -- 1.93*  HEPARINUNFRC -- -- -- --  CREATININE 1.93* -- 2.29* --  CKTOTAL -- -- -- --  CKMB -- -- -- --  TROPONINI -- -- -- --   Estimated Creatinine Clearance: 41.5 ml/min (by C-G formula based on Cr of 1.93).   Medications:  Scheduled:     . allopurinol  200 mg Oral QHS  . aspirin EC  81 mg Oral Daily  . bumetanide  2 mg Oral Daily  . carvedilol  6.25 mg Oral BID WC  . famotidine  20 mg Oral BID  . gabapentin  300 mg Oral BID  . hydrALAZINE  12.5 mg Oral Q8H  . isosorbide mononitrate  30 mg Oral Daily  . omega-3 acid ethyl esters  1 g Oral BID  . pravastatin  40 mg Oral q1800  . sodium chloride  3 mL Intravenous Q12H  . sodium chloride  3 mL Intravenous Q12H  . warfarin  2.5 mg Oral ONCE-1800  . DISCONTD: heparin subcutaneous  5,000 Units Subcutaneous Q8H  Reece Leader, Pharm D 12/18/2011 9:20 AM

## 2011-12-19 ENCOUNTER — Other Ambulatory Visit: Payer: Self-pay | Admitting: Cardiology

## 2011-12-19 LAB — PROTEIN S, TOTAL: Protein S Ag, Total: 49 % — ABNORMAL LOW (ref 60–150)

## 2011-12-19 LAB — PROTEIN C, TOTAL: Protein C, Total: 48 % — ABNORMAL LOW (ref 72–160)

## 2011-12-20 NOTE — Discharge Planning (Signed)
Patient ID: BRITAIN SABER MRN: [id number] DOB/AGE: 06-14-1941 71 y.o.  Admit date: 12/13/2011 Discharge date: 12/18/2011  Primary Discharge Diagnosis 1) Acute on chronic systolic CHF with markedly elevated 2) L-sided filling pressures  2)Low normal cardiac output  3) Moderate pulmonary venous HTN  4) Severe mitral regurgitation  5) CAD  6) ICM EF 20%  7) Chronic renal failure, stage 4  8) NSVT  Significant Diagnostic Studies:  12/18/11 CPX Test-  Peak VO2 13.1 Peak predicted 58.6%  RER 1.25 max effort VE/VCO2 slope 36.9 Oues 1.38   RHC cath 1/10: RA = 14 RV = 51/10/13 PA = 53/28 (37) PCW = mean 31 with v waves to 45 Fick cardiac output/index = 4.2/1.9 Thermodilution CO/CI = 4.6/2.1 PVR = 1.7 FA sat = 94% PA sat = 52%, 54%     Hospital Course:  71 y/o male with CHF and iCM, CRI and severe MR followed by Dr. Andee Lineman admitted for worsening HF admitted for RHC and evaluation for possible advanced therapies.  Patient has had steadily worsening functional capacity over past 6 months. TEE by Dr. Andee Lineman showed EF 20% with severe MR. Previous viability study showed little viability. Admitted to begin evaluation for advanced therapies.   On admit underwent RHC with results as above. Post-cath started on milrinone and IV lasix with improvement in his symptoms however diuresis was very limited as Cr bumped from 1.8 to 2.2. Switcehd back to oral bumex and metolazone weaned. Hydralazine and imdur added as he has been intolerant of ACE/ARB due to worsening renal insufficiency.  Prior to d/c underwent CPX as noted above. Felt to be candidate for mechanical support and Dr. Donata Clay was consulted and records faxed to Dr. Paulino Rily in the [facility] clinic for evalaution.    Discharge weight 102.7 kg (101.() . Renal function monitored closely with discharge creatinine 1.93. He will be discharged home in stable condition. He will be followed closely in the Heart Failure Clinic .    CONSULTATIONS 12/17/11 Dr Maren Beach provided consultations 12/17/11 for consideration of advanced heart therapies and thought he may be an acceptable candidate for evaluation for destination VAD therapy however his history of thromboembolic events(posterior basilar stroke, mesentery venous thrombosis, pulmonary embolus) would be of concern with implantation of a mechanical heart pump. He also recommended follow with Dr Cyndie Chime in the outpatient .     Blood pressure 122/71, pulse 66, temperature 97.5 F (36.4 C), temperature source Oral, resp. rate 18, height 5\' 10"  (1.778 m), weight 102.7 kg (226 lb 6.6 oz), SpO2 95.00%.  Weight change: 0.369 kg (13 oz)  Physical Exam:  General: Well appearing. No resp difficulty  HEENT: normal  Neck: supple. JVP 8 . Carotids 2+ bilat; no bruits. No lymphadenopathy or thryomegaly appreciated.  Cor: PMI laterally displaced. Regular rate & rhythm. No rubs 3/6 MR  +s3 Lungs: clear  Abdomen: soft, nontender, nondistended. No hepatosplenomegaly. No bruits or masses. Good bowel sounds.  Extremities: no cyanosis, clubbing, rash, edema  Neuro: alert & orientedx3, cranial nerves grossly intact. moves all 4 extremities w/o difficulty. Affect pleasant   Telemetry: V Paced   Discharge Info: Blood pressure 124/74, pulse 72, temperature 98.7 F (37.1 C), temperature source Oral, resp. rate 18, height 5\' 10"  (1.778 m), weight 102.7 kg (226 lb 6.6 oz), SpO2 97.00%.   Weight change:  No results found for this or any previous visit (from the past 24 hour(s)).   Discharge Medications: Discharge Medication List as of 12/18/2011  4:15 PM  START taking these medications   Details  hydrALAZINE (APRESOLINE) 25 MG tablet Take 0.5 tablets (12.5 mg total) by mouth every 8 (eight) hours., Starting 12/18/2011, Until Wed 12/17/12, Normal    isosorbide mononitrate (IMDUR) 30 MG 24 hr tablet Take 1 tablet (30 mg total) by mouth daily., Starting 12/18/2011, Until Wed 12/17/12,  Normal      CONTINUE these medications which have NOT CHANGED   Details  acetaminophen (TYLENOL) 500 MG tablet Take 500 mg by mouth 2 (two) times daily as needed. For pain, Until Discontinued, Historical Med    allopurinol (ZYLOPRIM) 100 MG tablet Take 200 mg by mouth at bedtime., Until Discontinued, Historical Med    aluminum acetate (A-MANTLE) CREA Apply 1 application topically daily as needed. For dry hands and feet, Until Discontinued, Historical Med    aspirin 81 MG tablet Take 81 mg by mouth daily.  , Until Discontinued, Historical Med    bumetanide (BUMEX) 2 MG tablet Take 2 mg by mouth daily., Until Discontinued, Historical Med    carvedilol (COREG) 6.25 MG tablet Take 6.25 mg by mouth 2 (two) times daily with a meal. , Until Discontinued, Historical Med    ergocalciferol (VITAMIN D2) 50000 UNITS capsule Take 50,000 Units by mouth once a week. Take on Thursdays, Until Discontinued, Historical Med    fish oil-omega-3 fatty acids 1000 MG capsule Take 1 g by mouth daily. , Until Discontinued, Historical Med    gabapentin (NEURONTIN) 300 MG capsule Take 300 mg by mouth 2 (two) times daily. , Until Discontinued, Historical Med    LORazepam (ATIVAN) 1 MG tablet Take 1 mg by mouth at bedtime as needed. For anxiety, Until Discontinued, Historical Med    predniSONE (DELTASONE) 20 MG tablet Take 20 mg by mouth daily as needed. Take for gout flare ups, Until Discontinued, Historical Med    ranitidine (ZANTAC) 150 MG capsule Take 150 mg by mouth 2 (two) times daily.  , Until Discontinued, Historical Med    Sulfacetamide Sodium-Sulfur (SUPHERA) 10-5 % CREA Apply 1 application topically daily. , Until Discontinued, Historical Med    warfarin (COUMADIN) 5 MG tablet Take 2.5-5 mg by mouth daily. Take 5mg  on Mondays and Fridays.  Take 2.5mg  on all other days., Until Discontinued, Historical Med    pravastatin (PRAVACHOL) 40 MG tablet Take 40 mg by mouth daily. , Until Discontinued, Historical  Med        Follow-up Plans & Instructions: Discharge Orders    Future Appointments: Provider: Department: Dept Phone: Center:   12/24/2011 2:30 PM [facility] [phone] None   12/25/2011 10:40 AM Louanna Raw, RN [facility] [phone] [facility]   02/19/2012 8:30 AM Peyton Bottoms, MD [facility] [phone] [facility]     Future Orders Please Complete By Expires   Diet - low sodium heart healthy      Increase activity slowly      (HEART FAILURE PATIENTS) Call MD:  Anytime you have any of the following symptoms: 1) 3 pound weight gain in 24 hours or 5 pounds in 1 week 2) shortness of breath, with or without a dry hacking cough 3) swelling in the hands, feet or stomach 4) if you have to sleep on extra pillows at night in order to breathe.      Discharge instructions      Comments:   Do the following things EVERYDAY: Weigh yourself in the morning before breakfast. Write it down and keep it in a log. Take your  medicines as prescribed Eat low salt foods-Limit salt (sodium) to 2000mg  per day.  Stay as active as you can everyday   Heart Failure patients record your daily weight using the same scale at the same time of day      Contraindication to ACEI at discharge      Scheduling Instructions:   Renal insufficiency. Discharge on hydralazine and Imdur     Follow-up Information    Follow up with Arvilla Meres, MD on 12/24/2011. (2:30 [place] Code [phone])    Contact information:   [facility] [address] [place] [address] [phone]       Follow up with Peyton Bottoms, MD on 12/25/2011. (10:40 PT/INR)    Contact information:   [address] [place] [address] [phone]       Follow up with Levert Feinstein, MD. (They will contact you for an appointment)    Contact information:   [address] [place] [address] [phone]           BRING ALL  MEDICATIONS WITH YOU TO FOLLOW UP APPOINTMENTS  Time spent with patient to include physician time: 10 Signed:  Arvilla Meres, MD 12/18/11 10:45 am

## 2011-12-24 ENCOUNTER — Encounter (HOSPITAL_COMMUNITY): Payer: Self-pay

## 2011-12-24 ENCOUNTER — Ambulatory Visit (HOSPITAL_COMMUNITY)
Admission: RE | Admit: 2011-12-24 | Discharge: 2011-12-24 | Disposition: A | Discharge status: Home / Self Care | Payer: Medicare Other | Source: Ambulatory Visit | Attending: Internal Medicine | Admitting: Internal Medicine

## 2011-12-24 VITALS — BP 136/78 | HR 96 | Wt 226.0 lb

## 2011-12-24 DIAGNOSIS — I5022 Chronic systolic (congestive) heart failure: Secondary | ICD-10-CM | POA: Insufficient documentation

## 2011-12-24 DIAGNOSIS — I059 Rheumatic mitral valve disease, unspecified: Secondary | ICD-10-CM | POA: Insufficient documentation

## 2011-12-24 DIAGNOSIS — I340 Nonrheumatic mitral (valve) insufficiency: Secondary | ICD-10-CM

## 2011-12-24 MED ORDER — HYDRALAZINE HCL 25 MG PO TABS: 25.0000 mg | ORAL_TABLET | Freq: Three times a day (TID) | ORAL | Status: DC

## 2011-12-24 NOTE — Assessment & Plan Note (Addendum)
He is mildly improved. NYHA III-IIIb. Volume status looks good. Given severe MR and scar burden very unlikely that his LV will recover. He has great family support and is very motivated. Thus, I think he is likely an excellent candidate for LVAD placement as destination therapy. We discussed this and he and his wife are both eager to consider. I think we need to move relatively quickly so that he does not have a fuerther decline in his renal dysfunction.  I have discussed again with Dr. Donata Clay who has also discussed with Dr. Romona Curls at [facility] who also agrees that LVAD is an appropriate therapy. We will await evaluation by [facility] transplant/[facility] service. Will increase hydralazine as BP tolerates.   Time spent 50 minutes.

## 2011-12-24 NOTE — Progress Notes (Signed)
Patient ID: [name], male   DOB: [birth year], 71 y.o.   MRN: [id number]   HPI:70 y/o male with chroinic systolic heart failure,  ICM EF 20%, CRF Stage 4, severe MR , NSVT, and CAD. Followed by [name] admitted 12/13/11 for worsening HF admitted for RHC and evaluation for possible advanced therapies.   Admitted last week for steadily worsening functional capacity over past 6 months. TEE by [name] showed EF 20% with severe MR. Previous viability study showed little viability. Admitted to begin evaluation for advanced therapies.   12/18/11 CPX Test- Peak VO2 13.1 Peak predicted 58.6% RER 1.25 max effort VE/VCO2 slope 36.9 Oues 1.38  RHC cath 1/10: RA = 14 RV = 51/10/13 PA = 53/28 (37) PCW = mean 31 with v waves to 45 Fick cardiac output/index = 4.2/1.9 Thermodilution CO/CI = 4.6/2.1 PVR = 1.7 FA sat = 94% PA sat = 52%, 54%   He is intolerant Ace/Arb due to renal insufficiency. Hydralazine and IMDUR initiated in the hospital. He did receive a short course of Milrinone with mild symptoamtic improvement. While in hospital Cr bumped with diuresis from 1.9 to 2.3 and back down to 1.9  Discharge creatinine 1.93 weight 225 pounds.  [name] evaluated for possible Advanced Heart Therapies however due to history of PE/CVA/mesenteric thrombus outpatient follow up with [name] is requested prior to any decision. However has been on coumadin for over 25 years without any recurrent clots. Information provided to [facility] for consideration for Advanced Heart Therapies.    He is here for post hospitalization.  Denies PND/Orthopnea/dizziness. SOB hills/ up stairs. Sleeps on 1 pillow at night.  Weight at home 220.8 Complains of L wrist pain. Continues to wear  CPAP. No lower extremity edema.  Compliant with medications. He has not received any information from [facility]. He has not resumed cardiac rehab but plans to start this week.   ROS: All systems negative except as listed in HPI, PMH and Problem  List.  Past Medical History  Diagnosis Date  . Diabetes mellitus   . 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 CAD (coronary artery disease)   . Gout   . Hypotension     ORTHOSTATIC  . DVT of lower extremity (deep venous thrombosis)     left  . Hypertension   . High cholesterol   . Pulmonary embolism on left 1988  . Myocardial infarction 1988  . Angina   . CHF (congestive heart failure)   . Heart murmur   . Mitral valve disorder   . Pneumonia 1993; 2009    "both serious cases"  . Pneumonia 2010    "not hospitalized"  . Shortness of breath on exertion   . Sleep apnea   . Blood transfusion   . Anemia   . H/O hiatal hernia   . Stomach ulcer 1987-1988  . Jaundice     "@ birth & when I was in 7th grade"  . 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 acetaminophen (TYLENOL) 500 MG tablet Take 500 mg by mouth 2 (two) times daily as needed. For pain      . allopurinol (ZYLOPRIM) 100 MG tablet Take 200 mg by mouth at bedtime.      [name] aluminum acetate (A-MANTLE) CREA Apply 1 application topically daily as needed. For dry hands and feet      .  aspirin 81 MG tablet Take 81 mg by mouth daily.        . bumetanide (BUMEX) 2 MG tablet Take 2 mg by mouth daily.      . carvedilol (COREG) 6.25 MG tablet Take 6.25 mg by mouth 2 (two) times daily with a meal.       . ergocalciferol (VITAMIN D2) 50000 UNITS capsule Take 50,000 Units by mouth once a week. Take on Thursdays      . fish oil-omega-3 fatty acids 1000 MG capsule Take 1 g by mouth daily.       [name] gabapentin (NEURONTIN) 300 MG capsule Take 300 mg by mouth 2 (two) times daily.       . hydrALAZINE (APRESOLINE) 25 MG tablet Take 1 tablet (25 mg total) by mouth every 8 (eight) hours.  90 tablet  6  . isosorbide mononitrate (IMDUR) 30 MG 24 hr tablet Take 1 tablet (30 mg total) by mouth daily.  30 tablet  6  . LORazepam  (ATIVAN) 1 MG tablet Take 1 mg by mouth at bedtime as needed. For anxiety      . pravastatin (PRAVACHOL) 40 MG tablet TAKE ONE TABLET BY MOUTH AT BEDTIME  30 tablet  6  . ranitidine (ZANTAC) 150 MG capsule Take 150 mg by mouth 2 (two) times daily.        . Sulfacetamide Sodium-Sulfur (SUPHERA) 10-5 % CREA Apply 1 application topically daily.       [name] warfarin (COUMADIN) 5 MG tablet Take 2.5-5 mg by mouth daily. Take 5mg  on Mondays and Fridays.  Take 2.5mg  on all other days.      . predniSONE (DELTASONE) 20 MG tablet Take 20 mg by mouth daily as needed. Take for gout flare ups         PHYSICAL EXAM: Filed Vitals:   12/24/11 1428  BP: 136/78  Pulse: 96   Weight change:  226 pounds (226) General:  Well appearing. No resp difficulty HEENT: normal Neck: supple. JVP flat. Carotids 2+ bilaterally; no bruits. No lymphadenopathy or thryomegaly appreciated. Cor: PMI normal. Regular rate & rhythm. No rubs, gallops or Mitral murmurs. Lungs: clear Abdomen: soft, nontender, nondistended. No hepatosplenomegaly. No bruits or masses. Good bowel sounds. Extremities: no cyanosis, clubbing, rash, edema Neuro: alert & orientedx3, cranial nerves grossly intact. Moves all 4 extremities w/o difficulty. Affect pleasant.    ECG:   ASSESSMENT & PLAN:

## 2011-12-24 NOTE — Assessment & Plan Note (Signed)
See above

## 2011-12-24 NOTE — Patient Instructions (Addendum)
Do the following things EVERYDAY: 1) Weigh yourself in the morning before breakfast. Write it down and keep it in a log. 2) Take your medicines as prescribed 3) Eat low salt foods-Limit salt (sodium) to 2000mg  per day.  4) Stay as active as you can everyday  Take hydralazine 25 mg three times a day  Follow up in 1 month

## 2011-12-25 ENCOUNTER — Ambulatory Visit (INDEPENDENT_AMBULATORY_CARE_PROVIDER_SITE_OTHER): Payer: Medicare Other | Admitting: *Deleted

## 2011-12-25 DIAGNOSIS — I82409 Acute embolism and thrombosis of unspecified deep veins of unspecified lower extremity: Secondary | ICD-10-CM

## 2011-12-25 DIAGNOSIS — Z7901 Long term (current) use of anticoagulants: Secondary | ICD-10-CM

## 2011-12-25 DIAGNOSIS — Z86718 Personal history of other venous thrombosis and embolism: Secondary | ICD-10-CM

## 2011-12-25 DIAGNOSIS — K55059 Acute (reversible) ischemia of intestine, part and extent unspecified: Secondary | ICD-10-CM

## 2011-12-25 DIAGNOSIS — I4891 Unspecified atrial fibrillation: Secondary | ICD-10-CM

## 2011-12-30 NOTE — Discharge Summary (Signed)
Patient ID: BANKS CHAIKIN MRN: [id number] DOB/AGE: 73-24-1942 73 y.o.  Admit date: 12/13/2011 Discharge date: 12/18/2011  Primary Discharge Diagnosis 1) Acute on chronic systolic CHF with markedly elevated 2) L-sided filling pressures  2)Low normal cardiac output  3) Moderate pulmonary venous HTN  4) Severe mitral regurgitation  5) CAD  6) ICM EF 20%  7) Chronic renal failure, stage 4  8) NSVT  Significant Diagnostic Studies:  12/18/11 CPX Test-  Peak VO2 13.1 Peak predicted 58.6%  RER 1.25 max effort VE/VCO2 slope 36.9 Oues 1.38   RHC cath 1/10: RA = 14 RV = 51/10/13 PA = 53/28 (37) PCW = mean 31 with v waves to 45 Fick cardiac output/index = 4.2/1.9 Thermodilution CO/CI = 4.6/2.1 PVR = 1.7 FA sat = 94% PA sat = 52%, 54%     Hospital Course:  71 y/o male with CHF and iCM, CRI and severe MR followed by Dr. Andee Lineman admitted for worsening HF admitted for RHC and evaluation for possible advanced therapies.  Patient has had steadily worsening functional capacity over past 6 months. TEE by Dr. Andee Lineman showed EF 20% with severe MR. Previous viability study showed little viability. Admitted to begin evaluation for advanced therapies.   On admit underwent RHC with results as above. Post-cath started on milrinone and IV lasix with improvement in his symptoms however diuresis was very limited as Cr bumped from 1.8 to 2.2. Switcehd back to oral bumex and metolazone weaned. Hydralazine and imdur added as he has been intolerant of ACE/ARB due to worsening renal insufficiency.  Prior to d/c underwent CPX as noted above. Felt to be candidate for mechanical support and Dr. Donata Clay was consulted and records faxed to Dr. Paulino Rily in the [facility] Transplant clinic for evalaution.    Discharge weight 102.7 kg (101.() . Renal function monitored closely with discharge creatinine 1.93. He will be discharged home in stable condition. He will be followed closely in the Heart Failure Clinic .    CONSULTATIONS 12/17/11 Dr Maren Beach provided consultations 12/17/11 for consideration of advanced heart therapies and thought he may be an acceptable candidate for evaluation for destination VAD therapy however his history of thromboembolic events(posterior basilar stroke, mesentery venous thrombosis, pulmonary embolus) would be of concern with implantation of a mechanical heart pump. He also recommended follow with Dr Cyndie Chime in the outpatient .     Blood pressure 122/71, pulse 66, temperature 97.5 F (36.4 C), temperature source Oral, resp. rate 18, height 5\' 10"  (1.778 m), weight 102.7 kg (226 lb 6.6 oz), SpO2 95.00%.  Weight change: 0.369 kg (13 oz)  Physical Exam:  General: Well appearing. No resp difficulty  HEENT: normal  Neck: supple. JVP 8 . Carotids 2+ bilat; no bruits. No lymphadenopathy or thryomegaly appreciated.  Cor: PMI laterally displaced. Regular rate & rhythm. No rubs 3/6 MR  +s3 Lungs: clear  Abdomen: soft, nontender, nondistended. No hepatosplenomegaly. No bruits or masses. Good bowel sounds.  Extremities: no cyanosis, clubbing, rash, edema  Neuro: alert & orientedx3, cranial nerves grossly intact. moves all 4 extremities w/o difficulty. Affect pleasant   Telemetry: V Paced   Discharge Info: Blood pressure 124/74, pulse 72, temperature 98.7 F (37.1 C), temperature source Oral, resp. rate 18, height 5\' 10"  (1.778 m), weight 102.7 kg (226 lb 6.6 oz), SpO2 97.00%.   Weight change:  No results found for this or any previous visit (from the past 24 hour(s)).   Discharge Medications: Discharge Medication List as of 12/18/2011  4:15 PM  START taking these medications   Details  isosorbide mononitrate (IMDUR) 30 MG 24 hr tablet Take 1 tablet (30 mg total) by mouth daily., Starting 12/18/2011, Until Wed 12/17/12, Normal    hydrALAZINE (APRESOLINE) 25 MG tablet Take 0.5 tablets (12.5 mg total) by mouth every 8 (eight) hours., Starting 12/18/2011, Until Wed 12/17/12,  Normal      CONTINUE these medications which have NOT CHANGED   Details  acetaminophen (TYLENOL) 500 MG tablet Take 500 mg by mouth 2 (two) times daily as needed. For pain, Until Discontinued, Historical Med    allopurinol (ZYLOPRIM) 100 MG tablet Take 200 mg by mouth at bedtime., Until Discontinued, Historical Med    aluminum acetate (A-MANTLE) CREA Apply 1 application topically daily as needed. For dry hands and feet, Until Discontinued, Historical Med    aspirin 81 MG tablet Take 81 mg by mouth daily.  , Until Discontinued, Historical Med    bumetanide (BUMEX) 2 MG tablet Take 2 mg by mouth daily., Until Discontinued, Historical Med    carvedilol (COREG) 6.25 MG tablet Take 6.25 mg by mouth 2 (two) times daily with a meal. , Until Discontinued, Historical Med    ergocalciferol (VITAMIN D2) 50000 UNITS capsule Take 50,000 Units by mouth once a week. Take on Thursdays, Until Discontinued, Historical Med    fish oil-omega-3 fatty acids 1000 MG capsule Take 1 g by mouth daily. , Until Discontinued, Historical Med    gabapentin (NEURONTIN) 300 MG capsule Take 300 mg by mouth 2 (two) times daily. , Until Discontinued, Historical Med    LORazepam (ATIVAN) 1 MG tablet Take 1 mg by mouth at bedtime as needed. For anxiety, Until Discontinued, Historical Med    predniSONE (DELTASONE) 20 MG tablet Take 20 mg by mouth daily as needed. Take for gout flare ups, Until Discontinued, Historical Med    ranitidine (ZANTAC) 150 MG capsule Take 150 mg by mouth 2 (two) times daily.  , Until Discontinued, Historical Med    Sulfacetamide Sodium-Sulfur (SUPHERA) 10-5 % CREA Apply 1 application topically daily. , Until Discontinued, Historical Med    warfarin (COUMADIN) 5 MG tablet Take 2.5-5 mg by mouth daily. Take 5mg  on Mondays and Fridays.  Take 2.5mg  on all other days., Until Discontinued, Historical Med    pravastatin (PRAVACHOL) 40 MG tablet Take 40 mg by mouth daily. , Until Discontinued, Historical  Med        Follow-up Plans & Instructions: Discharge Orders    Future Appointments: Provider: Department: Dept Phone: Center:   01/08/2012 10:50 AM Louanna Raw, RN [facility] [phone] [facility]   01/28/2012 1:45 PM [facility] [phone] None   02/19/2012 8:30 AM Peyton Bottoms, MD [facility] [phone] [facility]     Future Orders Please Complete By Expires   Diet - low sodium heart healthy      Increase activity slowly      (HEART FAILURE PATIENTS) Call MD:  Anytime you have any of the following symptoms: 1) 3 pound weight gain in 24 hours or 5 pounds in 1 week 2) shortness of breath, with or without a dry hacking cough 3) swelling in the hands, feet or stomach 4) if you have to sleep on extra pillows at night in order to breathe.      Discharge instructions      Comments:   Do the following things EVERYDAY: Weigh yourself in the morning before breakfast. Write it down and keep it in a log. Take your  medicines as prescribed Eat low salt foods-Limit salt (sodium) to 2000mg  per day.  Stay as active as you can everyday   Heart Failure patients record your daily weight using the same scale at the same time of day      Contraindication to ACEI at discharge      Scheduling Instructions:   Renal insufficiency. Discharge on hydralazine and Imdur     Follow-up Information    Follow up with Arvilla Meres, MD on 12/24/2011. (2:30 [place] Code [phone])    Contact information:   [facility] [address] [place] [address] [phone]       Follow up with Peyton Bottoms, MD on 12/25/2011. (10:40 PT/INR)    Contact information:   [address] [place] [address] [phone]       Follow up with Levert Feinstein, MD. (They will contact you for an appointment)    Contact information:   [address] [place] [address] [phone]           BRING ALL MEDICATIONS  WITH YOU TO FOLLOW UP APPOINTMENTS  Time spent with patient to include physician time: 25 Signed:  Arvilla Meres, MD 12/18/11 10:45 am

## 2012-01-02 ENCOUNTER — Telehealth (HOSPITAL_COMMUNITY): Payer: Self-pay | Admitting: *Deleted

## 2012-01-02 DIAGNOSIS — I5022 Chronic systolic (congestive) heart failure: Secondary | ICD-10-CM

## 2012-01-02 NOTE — Telephone Encounter (Signed)
Message copied by Noralee Space on Wed Jan 02, 2012  3:24 PM ------      Message from: Vanoss, Virginia D      Created: Mon Dec 31, 2011  1:31 PM       Please order PFTs with [facility].      Thank you!      ----- Message -----         From: Dolores Patty, MD         Sent: 12/29/2011  12:29 PM           To: [name], NP

## 2012-01-02 NOTE — Telephone Encounter (Signed)
Pt aware and order placed will schedule

## 2012-01-08 ENCOUNTER — Ambulatory Visit (INDEPENDENT_AMBULATORY_CARE_PROVIDER_SITE_OTHER): Payer: Medicare Other | Admitting: *Deleted

## 2012-01-08 DIAGNOSIS — I82409 Acute embolism and thrombosis of unspecified deep veins of unspecified lower extremity: Secondary | ICD-10-CM

## 2012-01-08 DIAGNOSIS — Z86718 Personal history of other venous thrombosis and embolism: Secondary | ICD-10-CM

## 2012-01-08 DIAGNOSIS — K55059 Acute (reversible) ischemia of intestine, part and extent unspecified: Secondary | ICD-10-CM

## 2012-01-08 DIAGNOSIS — Z7901 Long term (current) use of anticoagulants: Secondary | ICD-10-CM

## 2012-01-08 DIAGNOSIS — I4891 Unspecified atrial fibrillation: Secondary | ICD-10-CM

## 2012-01-08 LAB — POCT INR: INR: 3

## 2012-01-10 ENCOUNTER — Ambulatory Visit (HOSPITAL_COMMUNITY): Payer: Medicare Other

## 2012-01-15 ENCOUNTER — Ambulatory Visit (HOSPITAL_COMMUNITY)
Admission: RE | Admit: 2012-01-15 | Discharge: 2012-01-15 | Disposition: A | Discharge status: Home / Self Care | Payer: Medicare Other | Source: Ambulatory Visit | Attending: Internal Medicine | Admitting: Internal Medicine

## 2012-01-15 DIAGNOSIS — I509 Heart failure, unspecified: Secondary | ICD-10-CM | POA: Insufficient documentation

## 2012-01-15 DIAGNOSIS — I5022 Chronic systolic (congestive) heart failure: Secondary | ICD-10-CM

## 2012-01-15 LAB — PULMONARY FUNCTION TEST

## 2012-01-15 MED ORDER — ALBUTEROL SULFATE (5 MG/ML) 0.5% IN NEBU
2.5000 mg | INHALATION_SOLUTION | Freq: Once | RESPIRATORY_TRACT | Status: AC
  Administered 2012-01-15: 2.5 mg via RESPIRATORY_TRACT

## 2012-01-17 ENCOUNTER — Telehealth (HOSPITAL_COMMUNITY): Payer: Self-pay | Admitting: *Deleted

## 2012-01-17 NOTE — Telephone Encounter (Signed)
Spoke w/pt he has a rash on his arms, chest and back which appeared about a week ago, red whelps and itchy he took benadryl last night with not much relief, discussed w/Dr Bensimhon and plan for pt to stop Hydralazine and call back early next week with update, pt is agreeable

## 2012-01-17 NOTE — Telephone Encounter (Signed)
[name] called today.  [name] has developed an itchy rash and has no energy.  She believes it is related to his recent increase to his hydralazine.  She would like a call back.  Thanks.

## 2012-01-23 ENCOUNTER — Other Ambulatory Visit (HOSPITAL_COMMUNITY): Payer: Self-pay | Admitting: Physician Assistant

## 2012-01-23 ENCOUNTER — Telehealth (HOSPITAL_COMMUNITY): Payer: Self-pay | Admitting: *Deleted

## 2012-01-23 NOTE — Telephone Encounter (Signed)
Left mess for pt that I will let Dr Gala Romney know

## 2012-01-23 NOTE — Telephone Encounter (Signed)
[name] called today.  He has stopped taking the hydralazine, which was causing his to have a rash, it seems to have cleared up.  He was suppose to call on Monday but forgot.  He has an appointment at [facility] today, so you can leave a message on his phone for when he comes home.  Thanks.

## 2012-01-24 NOTE — Telephone Encounter (Signed)
Agree needs to stop hydralazine.

## 2012-01-24 NOTE — Telephone Encounter (Signed)
He has stopped and will remain off med

## 2012-01-28 ENCOUNTER — Ambulatory Visit (HOSPITAL_COMMUNITY)
Admission: RE | Admit: 2012-01-28 | Discharge: 2012-01-28 | Disposition: A | Discharge status: Home / Self Care | Payer: Medicare Other | Source: Ambulatory Visit | Attending: Internal Medicine | Admitting: Internal Medicine

## 2012-01-28 VITALS — BP 128/66 | HR 95 | Wt 224.5 lb

## 2012-01-28 DIAGNOSIS — I5022 Chronic systolic (congestive) heart failure: Secondary | ICD-10-CM | POA: Insufficient documentation

## 2012-01-28 DIAGNOSIS — I059 Rheumatic mitral valve disease, unspecified: Secondary | ICD-10-CM | POA: Insufficient documentation

## 2012-01-28 DIAGNOSIS — I340 Nonrheumatic mitral (valve) insufficiency: Secondary | ICD-10-CM

## 2012-01-28 NOTE — Progress Notes (Signed)
Patient ID: [name], male   DOB: [birth year], 71 y.o.   MRN: [id number] Patient ID: [name], male   DOB: Oct 05, 1941, 71 y.o.   MRN: [id number]  HPI:71 y/o male with chroinic systolic heart failure,  ICM EF 20%, CRF Stage 4, severe MR , NSVT, and CAD. Followed by Dr. Andee Lineman admitted 12/13/11 for worsening HF admitted for RHC and evaluation for possible advanced therapies.   Admitted last week for steadily worsening functional capacity over past 6 months. TEE by Dr. Andee Lineman showed EF 20% with severe MR. Previous viability study showed little viability. Admitted to begin evaluation for advanced therapies.   12/18/11 CPX Test- Peak VO2 13.1 Peak predicted 58.6% RER 1.25 max effort VE/VCO2 slope 36.9 Oues 1.38  RHC cath 1/10: RA = 14 RV = 51/10/13 PA = 53/28 (37) PCW = mean 31 with v waves to 45 Fick cardiac output/index = 4.2/1.9 Thermodilution CO/CI = 4.6/2.1 PVR = 1.7 FA sat = 94% PA sat = 52%, 54%   He is intolerant Ace/Arb due to renal insufficiency. Hydralazine and IMDUR initiated in the hospital. He did receive a short course of Milrinone with mild symptoamtic improvement. While in hospital Cr bumped with diuresis from 1.9 to 2.3 and back down to 1.9  Discharge creatinine 1.93 weight 225 pounds.  Dr Maren Beach evaluated for possible Advanced Heart Therapies however due to history of PE/CVA/mesenteric thrombus outpatient follow up with Dr Cyndie Chime is requested prior to any decision. However has been on coumadin for over 25 years without any recurrent clots. Information provided to [facility] for consideration for Advanced Heart Therapies.    He is not on an Ace Inhibitor due to renal insufficiency.  12/20/11 1.9  He is here for follow up. He has been evaluated by Dr Ardyth Harps and he spoke to Dr Romona Curls last week.  [facility] continuing obtain information. Denies PND/Orthopnea/dizziness. SOB hills/ up stairs. Sleeps on 1 pillow at night.  Weight at home 218.  Complains of R great toe pain and started  Prednisone. Continues to wear CPAP. Continues to attend cardiac rehab. No lower extremity edema.  Compliant with medications.  Hydralazine stopped due to rash and this has resolved.    ROS: All systems negative except as listed in HPI, PMH and Problem List.  Past Medical History  Diagnosis Date  . Diabetes mellitus   . 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 CAD (coronary artery disease)   . Gout   . Hypotension     ORTHOSTATIC  . DVT of lower extremity (deep venous thrombosis)     left  . Hypertension   . High cholesterol   . Pulmonary embolism on left 1988  . Myocardial infarction 1988  . Angina   . CHF (congestive heart failure)   . Heart murmur   . Mitral valve disorder   . Pneumonia 1993; 2009    "both serious cases"  . Pneumonia 2010    "not hospitalized"  . Shortness of breath on exertion   . Sleep apnea   . Blood transfusion   . Anemia   . H/O hiatal hernia   . Stomach ulcer 1987-1988  . Jaundice     "@ birth & when I was in 7th grade"  . 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 acetaminophen (TYLENOL) 500 MG tablet Take 500 mg by  mouth 2 (two) times daily as needed. For pain      . allopurinol (ZYLOPRIM) 100 MG tablet Take 200 mg by mouth at bedtime.      [name] aluminum acetate (A-MANTLE) CREA Apply 1 application topically daily as needed. For dry hands and feet      . aspirin 81 MG tablet Take 81 mg by mouth daily.        . bumetanide (BUMEX) 2 MG tablet Take 2 mg by mouth daily.      . carvedilol (COREG) 6.25 MG tablet Take 6.25 mg by mouth 2 (two) times daily with a meal.       . ergocalciferol (VITAMIN D2) 50000 UNITS capsule Take 50,000 Units by mouth once a week. Take on Thursdays      . fish oil-omega-3 fatty acids 1000 MG capsule Take 1 g by mouth daily.       [name] gabapentin (NEURONTIN) 300 MG capsule Take 300 mg by mouth 2 (two) times  daily.       . isosorbide mononitrate (IMDUR) 30 MG 24 hr tablet Take 1 tablet (30 mg total) by mouth daily.  30 tablet  6  . LORazepam (ATIVAN) 1 MG tablet Take 1 mg by mouth at bedtime as needed. For anxiety      . pravastatin (PRAVACHOL) 40 MG tablet TAKE ONE TABLET BY MOUTH AT BEDTIME  30 tablet  6  . predniSONE (DELTASONE) 20 MG tablet Take 20 mg by mouth daily as needed. Take for gout flare ups      . ranitidine (ZANTAC) 150 MG capsule Take 150 mg by mouth 2 (two) times daily.        . Sulfacetamide Sodium-Sulfur (SUPHERA) 10-5 % CREA Apply 1 application topically daily.       [name] warfarin (COUMADIN) 5 MG tablet Take 2.5-5 mg by mouth daily. Take 5mg  on Mondays and Fridays.  Take 2.5mg  on all other days.         PHYSICAL EXAM: Filed Vitals:   01/28/12 1358  BP: 128/66  Pulse: 95   Weight change:  224 pounds (226) General:  Well appearing. No resp difficulty HEENT: normal Neck: supple. JVP 7 Carotids 2+ bilaterally; no bruits. No lymphadenopathy or thryomegaly appreciated. Cor: PMI normal. Regular rate & rhythm. No rubs, gallops or Mitral murmurs. Lungs: clear Abdomen: soft, nontender, nondistended. No hepatosplenomegaly. No bruits or masses. Good bowel sounds. Extremities: no cyanosis, clubbing, rash,  bilateral compression stockings on edema Neuro: alert & orientedx3, cranial nerves grossly intact. Moves all 4 extremities w/o difficulty. Affect pleasant.    ASSESSMENT & PLAN:

## 2012-01-28 NOTE — Patient Instructions (Signed)
Follow up in 3 weeks  Do the following things EVERYDAY: 1) Weigh yourself in the morning before breakfast. Write it down and keep it in a log. 2) Take your medicines as prescribed 3) Eat low salt foods--Limit salt (sodium) to 2000mg  per day.  4) Stay as active as you can everyday  [facility]  [address] [place], [place] [address]

## 2012-01-28 NOTE — Assessment & Plan Note (Signed)
As above awaiting LVAD.

## 2012-01-28 NOTE — Assessment & Plan Note (Addendum)
Remains stable with NYHA III-IIIB symptoms. Volume status stable. LVAD evaluation currently underway at [facility] and expect implant likely in the next several weeks. Continue current medications. Unfortunately unable to tolerate hydralazine due to rash. He spoke with our VAD coordinator at length regarding: the need to notify fire station/EMS/power company and encouraged to obtain 3  Prong outlet in the bedroom.  He will follow up 02/04/12 with Dr Ardyth Harps. Follow up in 4 weeks.

## 2012-01-29 ENCOUNTER — Ambulatory Visit (INDEPENDENT_AMBULATORY_CARE_PROVIDER_SITE_OTHER): Payer: Medicare Other | Admitting: *Deleted

## 2012-01-29 DIAGNOSIS — I4891 Unspecified atrial fibrillation: Secondary | ICD-10-CM

## 2012-01-29 DIAGNOSIS — Z86718 Personal history of other venous thrombosis and embolism: Secondary | ICD-10-CM

## 2012-01-29 DIAGNOSIS — K55059 Acute (reversible) ischemia of intestine, part and extent unspecified: Secondary | ICD-10-CM

## 2012-01-29 DIAGNOSIS — Z7901 Long term (current) use of anticoagulants: Secondary | ICD-10-CM

## 2012-01-29 DIAGNOSIS — I82409 Acute embolism and thrombosis of unspecified deep veins of unspecified lower extremity: Secondary | ICD-10-CM

## 2012-02-06 ENCOUNTER — Encounter: Payer: Self-pay | Admitting: Internal Medicine

## 2012-02-06 ENCOUNTER — Other Ambulatory Visit (HOSPITAL_COMMUNITY): Payer: Self-pay | Admitting: Adult Health

## 2012-02-06 ENCOUNTER — Encounter (HOSPITAL_COMMUNITY): Payer: Self-pay | Admitting: *Deleted

## 2012-02-06 ENCOUNTER — Ambulatory Visit (INDEPENDENT_AMBULATORY_CARE_PROVIDER_SITE_OTHER): Payer: Medicare Other | Admitting: Internal Medicine

## 2012-02-06 ENCOUNTER — Telehealth (HOSPITAL_COMMUNITY): Payer: Self-pay | Admitting: *Deleted

## 2012-02-06 DIAGNOSIS — I4891 Unspecified atrial fibrillation: Secondary | ICD-10-CM

## 2012-02-06 DIAGNOSIS — I5022 Chronic systolic (congestive) heart failure: Secondary | ICD-10-CM

## 2012-02-06 DIAGNOSIS — I5023 Acute on chronic systolic (congestive) heart failure: Secondary | ICD-10-CM

## 2012-02-06 DIAGNOSIS — I2699 Other pulmonary embolism without acute cor pulmonale: Secondary | ICD-10-CM

## 2012-02-06 DIAGNOSIS — I469 Cardiac arrest, cause unspecified: Secondary | ICD-10-CM

## 2012-02-06 LAB — ICD DEVICE OBSERVATION
AL IMPEDENCE ICD: 456 Ohm
ATRIAL PACING ICD: 27.9 pct
BAMS-0001: 170 {beats}/min
CHARGE TIME: 9.138 s
FVT: 0
PACEART VT: 0
TOT-0001: 0
TOT-0002: 0
TZAT-0001ATACH: 2
TZAT-0001FASTVT: 1
TZAT-0001SLOWVT: 1
TZAT-0002ATACH: NEGATIVE
TZAT-0002FASTVT: NEGATIVE
TZAT-0004SLOWVT: 8
TZAT-0005SLOWVT: 88 pct
TZAT-0012ATACH: 150 ms
TZAT-0012SLOWVT: 200 ms
TZAT-0013SLOWVT: 3
TZAT-0018ATACH: NEGATIVE
TZAT-0018FASTVT: NEGATIVE
TZAT-0019ATACH: 6 V
TZAT-0020ATACH: 1.5 ms
TZAT-0020ATACH: 1.5 ms
TZAT-0020ATACH: 1.5 ms
TZON-0003ATACH: 350 ms
TZON-0003SLOWVT: 340 ms
TZON-0003VSLOWVT: 450 ms
TZON-0004SLOWVT: 20
TZON-0004VSLOWVT: 32
TZON-0005SLOWVT: 12
TZST-0001ATACH: 6
TZST-0001FASTVT: 2
TZST-0001FASTVT: 3
TZST-0001FASTVT: 4
TZST-0001FASTVT: 6
TZST-0001SLOWVT: 5
TZST-0002ATACH: NEGATIVE
TZST-0002FASTVT: NEGATIVE
TZST-0002FASTVT: NEGATIVE
TZST-0002FASTVT: NEGATIVE
TZST-0003SLOWVT: 35 J
TZST-0003SLOWVT: 35 J
VENTRICULAR PACING ICD: 77.59 pct

## 2012-02-06 NOTE — Assessment & Plan Note (Signed)
Normal BiV ICD function See Pace Art report No changes today  No ventricular arrhythmias

## 2012-02-06 NOTE — Telephone Encounter (Signed)
VQ scan scheduled for fri 3/8 at 11 am, cath scheduled for 2:30

## 2012-02-06 NOTE — Progress Notes (Signed)
PCP:  Ignatius Specking., MD, MD Primary Cardiologist:  DeGent Primary CHF:  Dr Gala Romney  The patient presents today for routine electrophysiology followup.  Since last being seen in our clinic, the patient has had progressive CHF.  He was admitted 1/13 and evaluated by the advanced heart failure team.  He is presently being evaluated at [facility] for LVAD.  The patient has stable fatigue but significantly decreased exercise tolerance.  Today, he denies symptoms of palpitations, chest pain,dizziness, presyncope, syncope, or ICD shocks.  The patient feels that he is tolerating medications without difficulties and is otherwise without complaint today.   Past Medical History  Diagnosis Date  . Diabetes mellitus   . 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 CAD (coronary artery disease)   . Gout   . Hypotension     ORTHOSTATIC  . DVT of lower extremity (deep venous thrombosis)     left  . Hypertension   . High cholesterol   . Pulmonary embolism on left 1988  . Myocardial infarction 1988  . Angina   . CHF (congestive heart failure)   . Heart murmur   . Mitral valve disorder   . Pneumonia 1993; 2009    "both serious cases"  . Pneumonia 2010    "not hospitalized"  . Shortness of breath on exertion   . Sleep apnea   . Blood transfusion   . Anemia   . H/O hiatal hernia   . Stomach ulcer 1987-1988  . Jaundice     "@ birth & when I was in 7th grade"  . 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 Biv 11/2010    ICD 12/11 MEDTRONIC  . Cataract extraction w/ intraocular lens  implant, bilateral 1990's  . Exploratory laparotomy w/ bowel resection 1993  . Tracheostomy 1993  . Coronary angioplasty 1988  . Coronary angioplasty with stent placement 2003    "1"  . Cardiac catheterization 12/13/11    Current Outpatient Prescriptions  Medication Sig Dispense Refill  . acetaminophen (TYLENOL) 500 MG tablet  Take 500 mg by mouth 2 (two) times daily as needed. For pain      . allopurinol (ZYLOPRIM) 100 MG tablet Take 200 mg by mouth at bedtime.      [name] aluminum acetate (A-MANTLE) CREA Apply 1 application topically daily as needed. For dry hands and feet      . aspirin 81 MG tablet Take 81 mg by mouth daily.        . bumetanide (BUMEX) 2 MG tablet Take 2 mg by mouth daily.      . carvedilol (COREG) 6.25 MG tablet Take 6.25 mg by mouth 2 (two) times daily with a meal.       . ergocalciferol (VITAMIN D2) 50000 UNITS capsule Take 50,000 Units by mouth once a week. Take on Thursdays      . fish oil-omega-3 fatty acids 1000 MG capsule Take 1 g by mouth daily.       [name] gabapentin (NEURONTIN) 300 MG capsule Take 300 mg by mouth 2 (two) times daily.       . isosorbide mononitrate (IMDUR) 30 MG 24 hr tablet Take 1 tablet (30 mg total) by mouth daily.  30 tablet  6  . LORazepam (ATIVAN) 1 MG tablet Take 1 mg by mouth at bedtime as needed. For anxiety      .  pravastatin (PRAVACHOL) 40 MG tablet TAKE ONE TABLET BY MOUTH AT BEDTIME  30 tablet  6  . predniSONE (DELTASONE) 20 MG tablet Take 20 mg by mouth daily as needed. Take for gout flare ups      . ranitidine (ZANTAC) 150 MG capsule Take 150 mg by mouth 2 (two) times daily.        . Sulfacetamide Sodium-Sulfur (SUPHERA) 10-5 % CREA Apply 1 application topically daily.       [name] warfarin (COUMADIN) 5 MG tablet Take 2.5-5 mg by mouth daily. Take 5mg  on Mondays and Fridays.  Take 2.5mg  on all other days.        Allergies  Allergen Reactions  . Levaquin Other (See Comments)    Doesn't tolerate  . Sulfonamide Derivatives Other (See Comments)    unknown  . Cefuroxime Rash  . Hydralazine Rash    History   Social History  . Marital Status: Married    Spouse Name: N/A    Number of Children: N/A  . Years of Education: N/A   Occupational History  . Not on file.   Social History Main Topics  . Smoking status: Former Smoker -- 1.0 packs/day for 30 years     Types: Cigarettes    Quit date: 12/03/1986  . Smokeless tobacco: Never Used  . Alcohol Use: No  . Drug Use: No  . Sexually Active: No   Other Topics Concern  . Not on file   Social History Narrative  . No narrative on file    Physical Exam: Filed Vitals:   02/06/12 0909  BP: 120/74  Pulse: 84  Height: 5\' 10"  (1.778 m)  Weight: 224 lb (101.606 kg)  SpO2: 97%    GEN- The patient is well appearing, alert and oriented x 3 today.   Head- normocephalic, atraumatic Eyes-  Sclera clear, conjunctiva pink Ears- hearing intact Oropharynx- clear Neck- supple, JVP 9cm Lungs- Clear to ausculation bilaterally, normal work of breathing Chest- ICD pocket is well healed Heart- Regular rate and rhythm (paced) GI- soft, NT, ND, + BS Extremities- no clubbing, cyanosis, + dependant edema  ICD interrogation- reviewed in detail today,  See PACEART report  Assessment and Plan:

## 2012-02-06 NOTE — Telephone Encounter (Signed)
Per Dr Gala Romney pt needs a VQ Scan and a right heart cath per Dr Ardyth Harps at [facility], will arrange

## 2012-02-06 NOTE — Telephone Encounter (Signed)
Per Dr Gala Romney pt needs to hold coumadin 3/6 and 3/7 pt is aware of both test and instructions reviewed with him via phone

## 2012-02-06 NOTE — Assessment & Plan Note (Signed)
Stable  I have spoken with Dr Gala Romney today who will arrange VQ scan and RHC as requested by Dr Ardyth Harps at [facility].

## 2012-02-06 NOTE — Assessment & Plan Note (Signed)
Longstanding persistent afib in setting of severe structural heart disease and severe MR Continue rate control and anticoagulation long term

## 2012-02-07 ENCOUNTER — Encounter (HOSPITAL_COMMUNITY): Payer: Self-pay | Admitting: Pharmacy Technician

## 2012-02-08 ENCOUNTER — Other Ambulatory Visit: Payer: Self-pay

## 2012-02-08 ENCOUNTER — Encounter (HOSPITAL_COMMUNITY): Payer: Self-pay | Admitting: Internal Medicine

## 2012-02-08 ENCOUNTER — Ambulatory Visit (HOSPITAL_COMMUNITY)
Admission: RE | Admit: 2012-02-08 | Discharge: 2012-02-08 | Disposition: A | Discharge status: Home / Self Care | Payer: Medicare Other | Source: Ambulatory Visit | Attending: Internal Medicine | Admitting: Internal Medicine

## 2012-02-08 ENCOUNTER — Encounter (HOSPITAL_COMMUNITY)
Admission: RE | Disposition: A | Discharge status: Home / Self Care | Payer: Self-pay | Source: Ambulatory Visit | Attending: Internal Medicine

## 2012-02-08 DIAGNOSIS — I509 Heart failure, unspecified: Secondary | ICD-10-CM

## 2012-02-08 DIAGNOSIS — N184 Chronic kidney disease, stage 4 (severe): Secondary | ICD-10-CM | POA: Insufficient documentation

## 2012-02-08 DIAGNOSIS — I5022 Chronic systolic (congestive) heart failure: Secondary | ICD-10-CM | POA: Insufficient documentation

## 2012-02-08 DIAGNOSIS — I2589 Other forms of chronic ischemic heart disease: Secondary | ICD-10-CM | POA: Insufficient documentation

## 2012-02-08 DIAGNOSIS — I2699 Other pulmonary embolism without acute cor pulmonale: Secondary | ICD-10-CM

## 2012-02-08 DIAGNOSIS — I472 Ventricular tachycardia, unspecified: Secondary | ICD-10-CM | POA: Insufficient documentation

## 2012-02-08 DIAGNOSIS — I059 Rheumatic mitral valve disease, unspecified: Secondary | ICD-10-CM | POA: Insufficient documentation

## 2012-02-08 DIAGNOSIS — Z01812 Encounter for preprocedural laboratory examination: Secondary | ICD-10-CM | POA: Insufficient documentation

## 2012-02-08 DIAGNOSIS — R0602 Shortness of breath: Secondary | ICD-10-CM | POA: Insufficient documentation

## 2012-02-08 DIAGNOSIS — I4729 Other ventricular tachycardia: Secondary | ICD-10-CM | POA: Insufficient documentation

## 2012-02-08 DIAGNOSIS — I2510 Atherosclerotic heart disease of native coronary artery without angina pectoris: Secondary | ICD-10-CM | POA: Insufficient documentation

## 2012-02-08 HISTORY — PX: RIGHT HEART CATHETERIZATION: SHX5447

## 2012-02-08 LAB — BASIC METABOLIC PANEL
Calcium: 8.6 mg/dL (ref 8.4–10.5)
Creatinine, Ser: 1.73 mg/dL — ABNORMAL HIGH (ref 0.50–1.35)
GFR calc Af Amer: 43 mL/min — ABNORMAL LOW (ref >90)
Sodium: 138 mEq/L (ref 135–145)

## 2012-02-08 LAB — CBC
Platelets: 322 10*3/uL (ref 150–400)
RBC: 3.97 MIL/uL — ABNORMAL LOW (ref 4.22–5.81)
RDW: 17.2 % — ABNORMAL HIGH (ref 11.5–15.5)
WBC: 10.1 10*3/uL (ref 4.0–10.5)

## 2012-02-08 LAB — POCT I-STAT 3, VENOUS BLOOD GAS (G3P V)
TCO2: 28 mmol/L (ref 0–100)
pH, Ven: 7.393 — ABNORMAL HIGH (ref 7.250–7.300)
pH, Ven: 7.396 — ABNORMAL HIGH (ref 7.250–7.300)

## 2012-02-08 LAB — PROTIME-INR
INR: 2.16 — ABNORMAL HIGH (ref 0.00–1.49)
Prothrombin Time: 24.5 seconds — ABNORMAL HIGH (ref 11.6–15.2)

## 2012-02-08 LAB — GLUCOSE, CAPILLARY: Glucose-Capillary: 94 mg/dL (ref 70–99)

## 2012-02-08 SURGERY — RIGHT HEART CATH
Anesthesia: LOCAL

## 2012-02-08 MED ORDER — SODIUM CHLORIDE 0.9 % IV SOLN
250.0000 mL | INTRAVENOUS | Status: DC | PRN
  Administered 2012-02-08: 14:00:00 via INTRAVENOUS

## 2012-02-08 MED ORDER — LIDOCAINE HCL (PF) 1 % IJ SOLN
INTRAMUSCULAR | Status: AC
  Filled 2012-02-08: qty 30

## 2012-02-08 MED ORDER — TECHNETIUM TO 99M ALBUMIN AGGREGATED
3.0000 | Freq: Once | INTRAVENOUS | Status: AC | PRN
  Administered 2012-02-08: 3 via INTRAVENOUS

## 2012-02-08 MED ORDER — SODIUM CHLORIDE 0.9 % IJ SOLN: 3.0000 mL | Freq: Two times a day (BID) | INTRAMUSCULAR | Status: DC

## 2012-02-08 MED ORDER — DIAZEPAM 5 MG PO TABS
5.0000 mg | ORAL_TABLET | ORAL | Status: AC
  Administered 2012-02-08: 5 mg via ORAL
  Filled 2012-02-08: qty 1

## 2012-02-08 MED ORDER — HEPARIN (PORCINE) IN NACL 2-0.9 UNIT/ML-% IJ SOLN
INTRAMUSCULAR | Status: AC
  Filled 2012-02-08: qty 2000

## 2012-02-08 MED ORDER — SODIUM CHLORIDE 0.9 % IJ SOLN: 3.0000 mL | INTRAMUSCULAR | Status: DC | PRN

## 2012-02-08 MED ORDER — ONDANSETRON HCL 4 MG/2ML IJ SOLN: 4.0000 mg | Freq: Four times a day (QID) | INTRAMUSCULAR | Status: DC | PRN

## 2012-02-08 MED ORDER — ACETAMINOPHEN 325 MG PO TABS: 650.0000 mg | ORAL_TABLET | ORAL | Status: DC | PRN

## 2012-02-08 MED ORDER — SODIUM CHLORIDE 0.9 % IV SOLN: 250.0000 mL | INTRAVENOUS | Status: DC | PRN

## 2012-02-08 MED ORDER — SODIUM CHLORIDE 0.9 % IV SOLN: INTRAVENOUS | Status: DC

## 2012-02-08 NOTE — Discharge Instructions (Signed)

## 2012-02-08 NOTE — Op Note (Signed)
Cardiac Cath Procedure Note:  Indication: HF  Procedures performed:  1) Right heart catheterization  Description of procedure:   The risks and indication of the procedure were explained. Consent was signed and placed on the chart. An appropriate timeout was taken prior to the procedure. The right groin was prepped and draped in the routine sterile fashion and anesthetized with 1% local lidocaine.   A 7 FR venous sheath was placed in the right femoral vein using a modified Seldinger technique. A standard Swan-Ganz catheter was used for the procedure.   Complications: None apparent.  Findings:  RA = 10 RV =  48/6/9 PA =  44/17 (31) PCW = 19 (v = 35) Fick cardiac output/index = 5.2/2.4 Thermo CO/CI = 4.7/2.1 PVR = 2.0 FA sat = 94% PA sat = 56%, 54%  Assessment:  1. Well compensated hemodynamics with prominent v-wave in PCWP tracing suggestive of MR 2. Normal PVR  Plan/Discussion:  Will review with [facility] regarding candidacy for LVAD.   Fahad Cisse 3:42 PM

## 2012-02-08 NOTE — H&P (View-Only) (Signed)
Patient ID: [name], male   DOB: 05/07/1941, 71 y.o.   MRN: [id number] Patient ID: [name], male   DOB: 10/25/1941, 71 y.o.   MRN: [id number]  HPI:71 y/o male with chroinic systolic heart failure,  ICM EF 20%, CRF Stage 4, severe MR , NSVT, and CAD. Followed by Dr. DeGent admitted 12/13/11 for worsening HF admitted for RHC and evaluation for possible advanced therapies.   Admitted last week for steadily worsening functional capacity over past 6 months. TEE by Dr. Degent showed EF 20% with severe MR. Previous viability study showed little viability. Admitted to begin evaluation for advanced therapies.   12/18/11 CPX Test- Peak VO2 13.1 Peak predicted 58.6% RER 1.25 max effort VE/VCO2 slope 36.9 Oues 1.38  RHC cath 1/10: RA = 14 RV = 51/10/13 PA = 53/28 (37) PCW = mean 31 with v waves to 45 Fick cardiac output/index = 4.2/1.9 Thermodilution CO/CI = 4.6/2.1 PVR = 1.7 FA sat = 94% PA sat = 52%, 54%   He is intolerant Ace/Arb due to renal insufficiency. Hydralazine and IMDUR initiated in the hospital. He did receive a short course of Milrinone with mild symptoamtic improvement. While in hospital Cr bumped with diuresis from 1.9 to 2.3 and back down to 1.9  Discharge creatinine 1.93 weight 225 pounds.  Dr Vantrigt evaluated for possible Advanced Heart Therapies however due to history of PE/CVA/mesenteric thrombus outpatient follow up with Dr Granfortuna is requested prior to any decision. However has been on coumadin for over 25 years without any recurrent clots. Information provided to [facility] for consideration for Advanced Heart Therapies.    He is not on an Ace Inhibitor due to renal insufficiency.  12/20/11 1.9  He is here for follow up. He has been evaluated by Dr Hernandez and he spoke to Dr Milano last week.  [facility] continuing obtain information. Denies PND/Orthopnea/dizziness. SOB hills/ up stairs. Sleeps on 1 pillow at night.  Weight at home 218.  Complains of R great toe pain and started  Prednisone. Continues to wear CPAP. Continues to attend cardiac rehab. No lower extremity edema.  Compliant with medications.  Hydralazine stopped due to rash and this has resolved.    ROS: All systems negative except as listed in HPI, PMH and Problem List.  Past Medical History  Diagnosis Date  . Diabetes mellitus   . 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 CAD (coronary artery disease)   . Gout   . Hypotension     ORTHOSTATIC  . DVT of lower extremity (deep venous thrombosis)     left  . Hypertension   . High cholesterol   . Pulmonary embolism on left 1988  . Myocardial infarction 1988  . Angina   . CHF (congestive heart failure)   . Heart murmur   . Mitral valve disorder   . Pneumonia 1993; 2009    "both serious cases"  . Pneumonia 2010    "not hospitalized"  . Shortness of breath on exertion   . Sleep apnea   . Blood transfusion   . Anemia   . H/O hiatal hernia   . Stomach ulcer 1987-1988  . Jaundice     "@ birth & when I was in 7th grade"  . 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 acetaminophen (TYLENOL) 500 MG tablet Take 500 mg by   mouth 2 (two) times daily as needed. For pain      . allopurinol (ZYLOPRIM) 100 MG tablet Take 200 mg by mouth at bedtime.      . aluminum acetate (A-MANTLE) CREA Apply 1 application topically daily as needed. For dry hands and feet      . aspirin 81 MG tablet Take 81 mg by mouth daily.        . bumetanide (BUMEX) 2 MG tablet Take 2 mg by mouth daily.      . carvedilol (COREG) 6.25 MG tablet Take 6.25 mg by mouth 2 (two) times daily with a meal.       . ergocalciferol (VITAMIN D2) 50000 UNITS capsule Take 50,000 Units by mouth once a week. Take on Thursdays      . fish oil-omega-3 fatty acids 1000 MG capsule Take 1 g by mouth daily.       . gabapentin (NEURONTIN) 300 MG capsule Take 300 mg by mouth 2 (two) times  daily.       . isosorbide mononitrate (IMDUR) 30 MG 24 hr tablet Take 1 tablet (30 mg total) by mouth daily.  30 tablet  6  . LORazepam (ATIVAN) 1 MG tablet Take 1 mg by mouth at bedtime as needed. For anxiety      . pravastatin (PRAVACHOL) 40 MG tablet TAKE ONE TABLET BY MOUTH AT BEDTIME  30 tablet  6  . predniSONE (DELTASONE) 20 MG tablet Take 20 mg by mouth daily as needed. Take for gout flare ups      . ranitidine (ZANTAC) 150 MG capsule Take 150 mg by mouth 2 (two) times daily.        . Sulfacetamide Sodium-Sulfur (SUPHERA) 10-5 % CREA Apply 1 application topically daily.       . warfarin (COUMADIN) 5 MG tablet Take 2.5-5 mg by mouth daily. Take 5mg on Mondays and Fridays.  Take 2.5mg on all other days.         PHYSICAL EXAM: Filed Vitals:   01/28/12 1358  BP: 128/66  Pulse: 95   Weight change:  224 pounds (226) General:  Well appearing. No resp difficulty HEENT: normal Neck: supple. JVP 7 Carotids 2+ bilaterally; no bruits. No lymphadenopathy or thryomegaly appreciated. Cor: PMI normal. Regular rate & rhythm. No rubs, gallops or Mitral murmurs. Lungs: clear Abdomen: soft, nontender, nondistended. No hepatosplenomegaly. No bruits or masses. Good bowel sounds. Extremities: no cyanosis, clubbing, rash,  bilateral compression stockings on edema Neuro: alert & orientedx3, cranial nerves grossly intact. Moves all 4 extremities w/o difficulty. Affect pleasant.    ASSESSMENT & PLAN: 

## 2012-02-08 NOTE — Interval H&P Note (Signed)
History and Physical Interval Note:  02/08/2012 3:20 PM  [name]  has presented today for surgery, with the diagnosis of heart failure - mitral regurgitation.  The various methods of treatment have been discussed with the patient and family. After consideration of risks, benefits and other options for treatment, the patient has consented to  Procedure(s) (LRB): RIGHT HEART CATH (N/A) as a surgical intervention .  The patients' history has been reviewed, patient examined, no change in status, stable for surgery.  I have reviewed the patients' chart and labs.  Questions were answered to the patient's satisfaction.     [name]

## 2012-02-19 ENCOUNTER — Ambulatory Visit (INDEPENDENT_AMBULATORY_CARE_PROVIDER_SITE_OTHER): Payer: Medicare Other | Admitting: Cardiology

## 2012-02-19 ENCOUNTER — Encounter: Payer: Self-pay | Admitting: Cardiology

## 2012-02-19 ENCOUNTER — Other Ambulatory Visit: Payer: Self-pay | Admitting: Cardiology

## 2012-02-19 DIAGNOSIS — I509 Heart failure, unspecified: Secondary | ICD-10-CM

## 2012-02-19 DIAGNOSIS — I5023 Acute on chronic systolic (congestive) heart failure: Secondary | ICD-10-CM

## 2012-02-19 DIAGNOSIS — I5022 Chronic systolic (congestive) heart failure: Secondary | ICD-10-CM

## 2012-02-19 DIAGNOSIS — R0602 Shortness of breath: Secondary | ICD-10-CM

## 2012-02-19 NOTE — Patient Instructions (Signed)
   Echo this morning - 10:00 a.m.  Follow up in 6-8 weeks

## 2012-02-21 ENCOUNTER — Ambulatory Visit (HOSPITAL_COMMUNITY)
Admission: RE | Admit: 2012-02-21 | Discharge: 2012-02-21 | Disposition: A | Discharge status: Home / Self Care | Payer: Medicare Other | Source: Ambulatory Visit | Attending: Internal Medicine | Admitting: Internal Medicine

## 2012-02-21 VITALS — BP 118/70 | HR 97 | Wt 222.2 lb

## 2012-02-21 DIAGNOSIS — I2510 Atherosclerotic heart disease of native coronary artery without angina pectoris: Secondary | ICD-10-CM | POA: Insufficient documentation

## 2012-02-21 DIAGNOSIS — R0989 Other specified symptoms and signs involving the circulatory and respiratory systems: Secondary | ICD-10-CM

## 2012-02-21 DIAGNOSIS — I5022 Chronic systolic (congestive) heart failure: Secondary | ICD-10-CM | POA: Insufficient documentation

## 2012-02-21 NOTE — Patient Instructions (Signed)
Continue taking medications as prescribed   Do the following things EVERYDAY: 1) Weigh yourself in the morning before breakfast. Write it down and keep it in a log. 2) Take your medicines as prescribed 3) Eat low salt foods--Limit salt (sodium) to 2000mg  per day.  4) Stay as active as you can everyday 5)

## 2012-02-21 NOTE — Progress Notes (Signed)
Patient ID: [name], male   DOB: 06-Mar-1941, 71 y.o.   MRN: [id number]  HPI:71 y/o male with chroinic systolic heart failure,  ICM EF 20%, CRF Stage 4, severe MR , NSVT, and CAD. Followed by Dr. Andee Lineman admitted 12/13/11 for worsening HF admitted for RHC and evaluation for possible advanced therapies. EF 20% with severe MR.  12/18/11 CPX Test- Peak VO2 13.1 Peak predicted 58.6% RER 1.25 max effort VE/VCO2 slope 36.9 Oues 1.38 ve/mvv 85% FEV1 1.88 (55%) FVC 2.64 (59) FEV1/FVC 71%   RHC cath 1/10: RA = 14 RV = 51/10/13 PA = 53/28 (37) PCW = mean 31 with v waves to 45 Fick cardiac output/index = 4.2/1.9 Thermodilution CO/CI = 4.6/2.1 PVR = 1.7 FA sat = 94% PA sat = 52%, 54%   RHC 02/08/12: RA = 10  RV = 48/6/9  PA = 44/17 (31)  PCW = 19 (v = 35)  Fick cardiac output/index = 5.2/2.4  Thermo CO/CI = 4.7/2.1  PVR = 2.0  FA sat = 94%  PA sat = 56%, 54%  VQ 3/13: low prob  Echo (at [facility]) 02/18/12: EF 20-25% infero-lateral scarring. RV mildly dilated mild to moderate HK. Mld TR. Moderate MR.  Has seen Dr.Miano at [facility] for consideration of VAD placement.  Denies PND/Orthopnea/dizziness. SOB hills/ up stairs and no energy. Sleeps on 1 pillow at night. Continues to wear CPAP. No lower extremity edema.  Compliant with medications. Follow up with [facility] in May, will call to follow up with plan.   ROS: All systems negative except as listed in HPI, PMH and Problem List.  Past Medical History  Diagnosis Date  . Diabetes mellitus   . 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 CAD (coronary artery disease)   . Gout   . Hypotension     ORTHOSTATIC  . DVT of lower extremity (deep venous thrombosis)     left  . Hypertension   . High cholesterol   . Pulmonary embolism on left 1988  . Myocardial infarction 1988  . Angina   . CHF (congestive heart failure)   . Heart murmur   . Mitral valve disorder   . Pneumonia 1993; 2009    "both serious cases"  . Pneumonia 2010    "not  hospitalized"  . Shortness of breath on exertion   . Sleep apnea   . Blood transfusion   . Anemia   . H/O hiatal hernia   . Stomach ulcer 1987-1988  . Jaundice     "@ birth & when I was in 7th grade"  . 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 allopurinol (ZYLOPRIM) 100 MG tablet Take 200 mg by mouth at bedtime.      [name] aspirin 81 MG tablet Take 81 mg by mouth every morning.       . Azelaic Acid (FINACEA EX) Apply 1 application topically 2 (two) times daily.      . bumetanide (BUMEX) 2 MG tablet Take 2 mg by mouth every morning.       . carvedilol (COREG) 6.25 MG tablet Take 6.25 mg by mouth 2 (two) times daily with a meal.       . ergocalciferol (VITAMIN D2) 50000 UNITS capsule Take 50,000 Units by mouth once a week. Take on Thursdays      . fish oil-omega-3 fatty  acids 1000 MG capsule Take 1 g by mouth every morning.       . gabapentin (NEURONTIN) 300 MG capsule Take 300 mg by mouth 2 (two) times daily.       [name] oxyCODONE-acetaminophen (PERCOCET) 5-325 MG per tablet Take 1 tablet by mouth every 6 (six) hours as needed. For pain      . predniSONE (DELTASONE) 20 MG tablet Take 20 mg by mouth daily as needed. Take for gout flare ups      . ranitidine (ZANTAC) 150 MG capsule Take 150 mg by mouth 2 (two) times daily.       [name] warfarin (COUMADIN) 5 MG tablet Take 2.5-5 mg by mouth daily. Take 5mg  on Mondays and 2.5mg  on all other days.         PHYSICAL EXAM: Filed Vitals:   02/21/12 1047  BP: 118/70  Pulse: 97   Weight change:  222 pounds (224) General:  Well appearing. No resp. difficulty HEENT: normal Neck: supple. No JVP Carotids 2+ bilaterally; no bruits. No lymphadenopathy or thryomegaly appreciated. Cor: PMI normal. Regular rate & rhythm. No rubs, gallops or Mitral murmurs Lungs: clear Abdomen: soft, nontender, nondistended. No hepatosplenomegaly. No bruits or  masses. Good bowel sounds. Extremities: no cyanosis, clubbing, rash,  bilateral compression stockings, trace edema Neuro: alert & orientedx3, cranial nerves grossly intact. Moves all 4 extremities w/o difficulty. Affect pleasant.    ASSESSMENT & PLAN:

## 2012-02-21 NOTE — Assessment & Plan Note (Addendum)
Remains NYHA class III B; volume status stable; Work-up for LVAD ongoing. Will discuss with [facility] regarding timing of LVAD and call pt. Otherwise stable. Continue current regimen.

## 2012-02-26 ENCOUNTER — Telehealth: Payer: Self-pay | Admitting: Cardiology

## 2012-02-26 NOTE — Telephone Encounter (Signed)
[name] fell on 02-24-2012 at his home. Has injury to right shoulder. Being referred to Dr. Ardine Eng in [place], [place] On 02-29-2012 for evaluation. Mr. Morina was told he needs to come off coumdin today ( 02-26-2012) in case they Have to perform surgery on him Friday. Patient is very concerned about coming off his coumdin .

## 2012-03-04 NOTE — Telephone Encounter (Signed)
Patient should not come off Coumadin prior to surgery particularly when surgery is not certain yet.  He can start some bridging Lovenox in the meanwhile.  He can come to the Coumadin clinic and Lasix and start him on bridging Lovenox in case they have to operate.  Would give low dose Lovenox after surgery for DVT prophylaxis and at the same time restart Coumadin.  If Dr. Ardine Eng in the arm has problems with this and he will need to call me on my cell phone number at the office.  Cell phone [phone]

## 2012-03-04 NOTE — Telephone Encounter (Signed)
Spoke with patient.  States surgery is not a option for his shoulder.  Treatment is to keep arm in a sling.  Coumadin therapy has not been interrupted and pt will remain on coumadin as previously ordered.

## 2012-03-06 NOTE — Assessment & Plan Note (Signed)
No evidence of ischemia. Continue current regimen.   

## 2012-03-06 NOTE — Progress Notes (Signed)
Patient ID: [name], male   DOB: [birth year], 72 y.o.   MRN: [id number]  HPI:70 y/o male with chroinic systolic heart failure,  ICM EF 20%, CRF Stage 4, severe MR , NSVT, and CAD. Followed by Dr. Andee Lineman admitted 12/13/11 for worsening HF admitted for RHC and evaluation for possible advanced therapies. EF 20% with severe MR.  12/18/11 CPX Test- Peak VO2 13.1 Peak predicted 58.6% RER 1.25 max effort VE/VCO2 slope 36.9 Oues 1.38 ve/mvv 85% FEV1 1.88 (55%) FVC 2.64 (59) FEV1/FVC 71%   RHC cath 1/10: RA = 14 RV = 51/10/13 PA = 53/28 (37) PCW = mean 31 with v waves to 45 Fick cardiac output/index = 4.2/1.9 Thermodilution CO/CI = 4.6/2.1 PVR = 1.7 FA sat = 94% PA sat = 52%, 54%   RHC 02/08/12: RA = 10  RV = 48/6/9  PA = 44/17 (31)  PCW = 19 (v = 35)  Fick cardiac output/index = 5.2/2.4  Thermo CO/CI = 4.7/2.1  PVR = 2.0  FA sat = 94%  PA sat = 56%, 54%  VQ 3/13: low prob  Echo (at [facility]) 02/18/12: EF 20-25% infero-lateral scarring. RV mildly dilated mild to moderate HK. Mld TR. Moderate MR.  Has seen Dr.Miano at [facility] for consideration of VAD placement.  Denies PND/Orthopnea/dizziness. SOB hills/ up stairs and no energy. Sleeps on 1 pillow at night. Continues to wear CPAP. No lower extremity edema.  Compliant with medications. Follow up with [facility] in May, will call to follow up with plan.   ROS: All systems negative except as listed in HPI, PMH and Problem List.  Past Medical History  Diagnosis Date  . Diabetes mellitus   . 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 CAD (coronary artery disease)   . Gout   . Hypotension     ORTHOSTATIC  . DVT of lower extremity (deep venous thrombosis)     left  . Hypertension   . High cholesterol   . Pulmonary embolism on left 1988  . Myocardial infarction 1988  . Angina   . CHF (congestive heart failure)   . Heart murmur   . Mitral valve disorder   . Pneumonia 1993; 2009    "both serious cases"  . Pneumonia 2010    "not  hospitalized"  . Shortness of breath on exertion   . Sleep apnea   . Blood transfusion   . Anemia   . H/O hiatal hernia   . Stomach ulcer 1987-1988  . Jaundice     "@ birth & when I was in 7th grade"  . 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 allopurinol (ZYLOPRIM) 100 MG tablet Take 200 mg by mouth at bedtime.      [name] aspirin 81 MG tablet Take 81 mg by mouth every morning.       . Azelaic Acid (FINACEA EX) Apply 1 application topically 2 (two) times daily.      . bumetanide (BUMEX) 2 MG tablet Take 2 mg by mouth every morning.       . carvedilol (COREG) 6.25 MG tablet Take 6.25 mg by mouth 2 (two) times daily with a meal.       . ergocalciferol (VITAMIN D2) 50000 UNITS capsule Take 50,000 Units by mouth once a week. Take on Thursdays      . fish oil-omega-3 fatty  acids 1000 MG capsule Take 1 g by mouth every morning.       . gabapentin (NEURONTIN) 300 MG capsule Take 300 mg by mouth 2 (two) times daily.       [name] oxyCODONE-acetaminophen (PERCOCET) 5-325 MG per tablet Take 1 tablet by mouth every 6 (six) hours as needed. For pain      . predniSONE (DELTASONE) 20 MG tablet Take 20 mg by mouth daily as needed. Take for gout flare ups      . ranitidine (ZANTAC) 150 MG capsule Take 150 mg by mouth 2 (two) times daily.       [name] warfarin (COUMADIN) 5 MG tablet Take 2.5-5 mg by mouth daily. Take 5mg  on Mondays and 2.5mg  on all other days.      [name] DISCONTD: isosorbide mononitrate (IMDUR) 30 MG 24 hr tablet Take 1 tablet (30 mg total) by mouth daily.  30 tablet  6     PHYSICAL EXAM: Filed Vitals:   02/21/12 1047  BP: 118/70  Pulse: 97   Weight change:  222 pounds (224) General:  Well appearing. No resp. difficulty HEENT: normal Neck: supple. No JVP Carotids 2+ bilaterally; no bruits. No lymphadenopathy or thryomegaly appreciated. Cor: PMI normal. Regular rate & rhythm. No  rubs, gallops 2/6 MR murmur Lungs: clear with mildly decreased BS Abdomen: soft, nontender, nondistended. No hepatosplenomegaly. No bruits or masses. Good bowel sounds. Extremities: no cyanosis, clubbing, rash,  bilateral compression stockings, trace edema Neuro: alert & orientedx3, cranial nerves grossly intact. Moves all 4 extremities w/o difficulty. Affect pleasant.    ASSESSMENT & PLAN:

## 2012-03-14 ENCOUNTER — Ambulatory Visit (INDEPENDENT_AMBULATORY_CARE_PROVIDER_SITE_OTHER): Payer: Medicare Other | Admitting: *Deleted

## 2012-03-14 DIAGNOSIS — K55059 Acute (reversible) ischemia of intestine, part and extent unspecified: Secondary | ICD-10-CM

## 2012-03-14 DIAGNOSIS — Z86718 Personal history of other venous thrombosis and embolism: Secondary | ICD-10-CM

## 2012-03-14 DIAGNOSIS — I82409 Acute embolism and thrombosis of unspecified deep veins of unspecified lower extremity: Secondary | ICD-10-CM

## 2012-03-14 DIAGNOSIS — I4891 Unspecified atrial fibrillation: Secondary | ICD-10-CM

## 2012-03-14 DIAGNOSIS — Z7901 Long term (current) use of anticoagulants: Secondary | ICD-10-CM

## 2012-03-18 ENCOUNTER — Ambulatory Visit (INDEPENDENT_AMBULATORY_CARE_PROVIDER_SITE_OTHER): Payer: Medicare Other | Admitting: *Deleted

## 2012-03-18 DIAGNOSIS — I82409 Acute embolism and thrombosis of unspecified deep veins of unspecified lower extremity: Secondary | ICD-10-CM

## 2012-03-18 DIAGNOSIS — Z7901 Long term (current) use of anticoagulants: Secondary | ICD-10-CM

## 2012-03-18 DIAGNOSIS — Z86718 Personal history of other venous thrombosis and embolism: Secondary | ICD-10-CM

## 2012-03-18 DIAGNOSIS — I4891 Unspecified atrial fibrillation: Secondary | ICD-10-CM

## 2012-03-18 DIAGNOSIS — K55059 Acute (reversible) ischemia of intestine, part and extent unspecified: Secondary | ICD-10-CM

## 2012-03-18 LAB — POCT INR: INR: 2.9

## 2012-03-21 ENCOUNTER — Ambulatory Visit (INDEPENDENT_AMBULATORY_CARE_PROVIDER_SITE_OTHER): Payer: Medicare Other | Admitting: *Deleted

## 2012-03-21 DIAGNOSIS — I4891 Unspecified atrial fibrillation: Secondary | ICD-10-CM

## 2012-03-21 DIAGNOSIS — K55059 Acute (reversible) ischemia of intestine, part and extent unspecified: Secondary | ICD-10-CM

## 2012-03-21 DIAGNOSIS — I82409 Acute embolism and thrombosis of unspecified deep veins of unspecified lower extremity: Secondary | ICD-10-CM

## 2012-03-21 DIAGNOSIS — Z86718 Personal history of other venous thrombosis and embolism: Secondary | ICD-10-CM

## 2012-03-21 DIAGNOSIS — Z7901 Long term (current) use of anticoagulants: Secondary | ICD-10-CM

## 2012-03-21 LAB — POCT INR: INR: 2.5

## 2012-03-28 ENCOUNTER — Ambulatory Visit (INDEPENDENT_AMBULATORY_CARE_PROVIDER_SITE_OTHER): Payer: Medicare Other | Admitting: *Deleted

## 2012-03-28 DIAGNOSIS — K55059 Acute (reversible) ischemia of intestine, part and extent unspecified: Secondary | ICD-10-CM

## 2012-03-28 DIAGNOSIS — Z7901 Long term (current) use of anticoagulants: Secondary | ICD-10-CM

## 2012-03-28 DIAGNOSIS — I4891 Unspecified atrial fibrillation: Secondary | ICD-10-CM

## 2012-03-28 DIAGNOSIS — Z86718 Personal history of other venous thrombosis and embolism: Secondary | ICD-10-CM

## 2012-03-28 DIAGNOSIS — I82409 Acute embolism and thrombosis of unspecified deep veins of unspecified lower extremity: Secondary | ICD-10-CM

## 2012-03-28 LAB — POCT INR: INR: 3

## 2012-04-11 ENCOUNTER — Ambulatory Visit (INDEPENDENT_AMBULATORY_CARE_PROVIDER_SITE_OTHER): Payer: Medicare Other | Admitting: *Deleted

## 2012-04-11 DIAGNOSIS — Z86718 Personal history of other venous thrombosis and embolism: Secondary | ICD-10-CM

## 2012-04-11 DIAGNOSIS — Z7901 Long term (current) use of anticoagulants: Secondary | ICD-10-CM

## 2012-04-11 DIAGNOSIS — I4891 Unspecified atrial fibrillation: Secondary | ICD-10-CM

## 2012-04-11 DIAGNOSIS — I82409 Acute embolism and thrombosis of unspecified deep veins of unspecified lower extremity: Secondary | ICD-10-CM

## 2012-04-11 DIAGNOSIS — K55059 Acute (reversible) ischemia of intestine, part and extent unspecified: Secondary | ICD-10-CM

## 2012-04-12 ENCOUNTER — Other Ambulatory Visit: Payer: Self-pay | Admitting: Cardiology

## 2012-04-17 ENCOUNTER — Ambulatory Visit (INDEPENDENT_AMBULATORY_CARE_PROVIDER_SITE_OTHER): Payer: Medicare Other | Admitting: Cardiology

## 2012-04-17 ENCOUNTER — Encounter: Payer: Self-pay | Admitting: Cardiology

## 2012-04-17 ENCOUNTER — Telehealth: Payer: Self-pay | Admitting: *Deleted

## 2012-04-17 VITALS — BP 106/68 | HR 77 | Ht 70.0 in | Wt 215.0 lb

## 2012-04-17 DIAGNOSIS — I2510 Atherosclerotic heart disease of native coronary artery without angina pectoris: Secondary | ICD-10-CM

## 2012-04-17 DIAGNOSIS — I5022 Chronic systolic (congestive) heart failure: Secondary | ICD-10-CM

## 2012-04-17 DIAGNOSIS — R0602 Shortness of breath: Secondary | ICD-10-CM

## 2012-04-17 MED ORDER — HYDROCOD POLST-CHLORPHEN POLST 10-8 MG/5ML PO LQCR: 5.0000 mL | Freq: Two times a day (BID) | ORAL | Status: DC

## 2012-04-17 MED ORDER — AMOXICILLIN-POT CLAVULANATE 875-125 MG PO TABS: 1.0000 | ORAL_TABLET | Freq: Two times a day (BID) | ORAL | Status: AC

## 2012-04-17 NOTE — Telephone Encounter (Signed)
Cough medication prescription tussionex is too expensive and patient is requesting something cheaper like hycodan. Please advise.

## 2012-04-17 NOTE — Patient Instructions (Signed)
   Augmentin 875mg  twice a day  X 7 days  Tussionex - 5ml every 12 hours as needed, max 10 days Your physician wants you to follow up in: 6 months.  You will receive a reminder letter in the mail one-two months in advance.  If you don't receive a letter, please call our office to schedule the follow up appointment

## 2012-04-18 MED ORDER — HYDROCODONE-HOMATROPINE 5-1.5 MG/5ML PO SYRP: 5.0000 mL | ORAL_SOLUTION | Freq: Two times a day (BID) | ORAL | Status: DC | PRN

## 2012-04-18 NOTE — Progress Notes (Signed)
Peyton Bottoms, MD, [facility] Board Certified in Adult Cardiovascular Medicine,Internal Medicine and Critical Care Medicine    CC: Brief office visit in anticipation of LVAD placement at [facility]  HPI:  The patient has a severe ischemic cardiomyopathy with class IV heart failure symptoms.  He is felt to be a candidate for LVAD therapy.  He is awaiting approval from his surgeon Dr. Chyrel Masson date of surgery has to be determined. This office visit was made to discuss his need for LVAD And make sure that we proceed with this procedure   PMH: reviewed and listed in Problem List in Electronic Records (and see below) Past Medical History  Diagnosis Date  . Diabetes mellitus   . 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 CAD (coronary artery disease)   . Gout   . Hypotension     ORTHOSTATIC  . DVT of lower extremity (deep venous thrombosis)     left  . Hypertension   . High cholesterol   . Pulmonary embolism on left 1988  . Myocardial infarction 1988  . Angina   . CHF (congestive heart failure)   . Heart murmur   . Mitral valve disorder   . Pneumonia 1993; 2009    "both serious cases"  . Pneumonia 2010    "not hospitalized"  . Shortness of breath on exertion   . Sleep apnea   . Blood transfusion   . Anemia   . H/O hiatal hernia   . Stomach ulcer 1987-1988  . Jaundice     "@ birth & when I was in 7th grade"  . 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 Biv 11/2010    ICD 12/11 MEDTRONIC  . Cataract extraction w/ intraocular lens  implant, bilateral 1990's  . Exploratory laparotomy w/ bowel resection 1993  . Tracheostomy 1993  . Coronary angioplasty 1988  . Coronary angioplasty with stent placement 2003    "1"  . Cardiac catheterization 12/13/11    Allergies/SH/FHX : available in Electronic Records for review  Allergies  Allergen Reactions  . Levofloxacin Other (See Comments)   Doesn't tolerate  . Sulfonamide Derivatives Other (See Comments)    unknown  . Cefuroxime Rash  . Hydralazine Rash   History   Social History  . Marital Status: Married    Spouse Name: N/A    Number of Children: N/A  . Years of Education: N/A   Occupational History  . Not on file.   Social History Main Topics  . Smoking status: Former Smoker -- 1.0 packs/day for 30 years    Types: Cigarettes    Quit date: 12/03/1986  . Smokeless tobacco: Never Used  . Alcohol Use: No  . Drug Use: No  . Sexually Active: No   Other Topics Concern  . Not on file   Social History Narrative  . No narrative on file   No family history on file.  Medications: Current Outpatient Prescriptions  Medication Sig Dispense Refill  . allopurinol (ZYLOPRIM) 100 MG tablet Take 200 mg by mouth at bedtime.      [name] aspirin 81 MG tablet Take 81 mg by mouth every morning.       . bumetanide (BUMEX) 2 MG tablet Take 2 mg by mouth every morning.       . carvedilol (COREG) 6.25 MG tablet Take 6.25 mg by mouth  2 (two) times daily with a meal.       . ergocalciferol (VITAMIN D2) 50000 UNITS capsule Take 50,000 Units by mouth once a week. Take on Thursdays      . fish oil-omega-3 fatty acids 1000 MG capsule Take 1 g by mouth every morning.       . gabapentin (NEURONTIN) 300 MG capsule Take 300 mg by mouth 2 (two) times daily.       . predniSONE (DELTASONE) 20 MG tablet Take 20 mg by mouth daily as needed. Take for gout flare ups      . ranitidine (ZANTAC) 150 MG capsule Take 150 mg by mouth 2 (two) times daily.       [name] acetaminophen (TYLENOL) 500 MG tablet Take 500 mg by mouth every 6 (six) hours as needed.      [name] aluminum acetate (ACID MANTLE) CREA Apply 1 application topically as needed.      [name] amoxicillin-clavulanate (AUGMENTIN) 875-125 MG per tablet Take 1 tablet by mouth 2 (two) times daily.  14 tablet  0  . chlorpheniramine-HYDROcodone (TUSSIONEX PENNKINETIC ER) 10-8 MG/5ML LQCR Take 5 mLs by mouth every 12  (twelve) hours.  115 mL  0  . isosorbide mononitrate (IMDUR) 30 MG 24 hr tablet Take 30 mg by mouth daily.      [name] LORazepam (ATIVAN) 1 MG tablet Take 1 tablet by mouth At bedtime as needed.      . pravastatin (PRAVACHOL) 40 MG tablet Take 1 tablet by mouth Daily.      . Sulfacetamide Sodium-Sulfur (SUPHERA) 10-5 % CREA Apply 1 application topically daily.      [name] warfarin (COUMADIN) 5 MG tablet TAKE AS DIRECTED PER COUMADIN CLINIC  90 tablet  1    ROS: No nausea or vomiting. No fever or chills.No melena or hematochezia.No bleeding.No claudication  Physical Exam: BP 112/70  Pulse 84  Ht 5\' 10"  (1.778 m)  Wt 224 lb (101.606 kg)  BMI 32.14 kg/m2  SpO2 93% General:Overweight white male short of breath Neck:Normal carotid upstrokes no carotid bruits.  JVP is 9 cm Lungs:Faint crackles at the bases Cardiac:Regular rate and rhythm with normal S1 and S2-2 to 3/6 holosystolic murmur at apex Vascular:Trace edema.  Normal distal pulses Skin:Warm and dry Physcologic:Normal affect  12lead ECG:Not obtained Limited bedside ECHO:N/A No images are attached to the encounter.   Assessment and Plan  CHRONIC SYSTOLIC HEART FAILURE Ejection fraction 20-25%.  The patient is being evaluated for an LVAD.  Reportedly has not heard from [facility] yet.  We'll contact Dr. Romona Curls make sure the patient has followup and can proceed with LVAD.  No other medication changes were done during this office visit.  SHORTNESS OF BREATH I made no change in the patient's medications as he is awaiting LVAD placement.    Patient Active Problem List  Diagnoses  . DM  . HYPERLIPIDEMIA-MIXED  . OBSTRUCTIVE SLEEP APNEA  . CAD, NATIVE VESSEL  . CORONARY ARTERY DISEASE, S/P PTCA  . ATRIAL FIBRILLATION  . LEFT VENTRICULAR FUNCTION, DECREASED  . DVT  . HYPOTENSION, ORTHOSTATIC  . PULMONARY NODULE  . MESENTERIC VENOUS THROMBOSIS  . OTH SPEC D/O RESULT FROM IMPAIRED RENAL FUNCTION  . PULMONARY EMBOLISM, HX OF  . CHRONIC  SYSTOLIC HEART FAILURE  . SHORTNESS OF BREATH  . POSTSURG PERCUT TRANSLUMINAL COR ANGPLSTY STS  . CARDIAC ARREST  . Encounter for long-term (current) use of anticoagulants  . Edema  . Peripheral neuropathy  . Acute on chronic systolic  heart failure  . Mitral regurgitation  . Renal insufficiency

## 2012-04-18 NOTE — Assessment & Plan Note (Signed)
I made no change in the patient's medications as he is awaiting LVAD placement.

## 2012-04-18 NOTE — Assessment & Plan Note (Signed)
Ejection fraction 20-25%.  The patient is being evaluated for an LVAD.  Reportedly has not heard from [facility] yet.  We'll contact Dr. Romona Curls make sure the patient has followup and can proceed with LVAD.  No other medication changes were done during this office visit.

## 2012-04-18 NOTE — Telephone Encounter (Signed)
That would be fine 

## 2012-04-22 ENCOUNTER — Encounter (HOSPITAL_COMMUNITY): Payer: Self-pay

## 2012-04-22 ENCOUNTER — Ambulatory Visit (HOSPITAL_COMMUNITY)
Admission: RE | Admit: 2012-04-22 | Discharge: 2012-04-22 | Disposition: A | Discharge status: Home / Self Care | Payer: Medicare Other | Source: Ambulatory Visit | Attending: Internal Medicine | Admitting: Internal Medicine

## 2012-04-22 VITALS — BP 84/52 | HR 76 | Wt 222.0 lb

## 2012-04-22 DIAGNOSIS — I5022 Chronic systolic (congestive) heart failure: Secondary | ICD-10-CM | POA: Insufficient documentation

## 2012-04-22 DIAGNOSIS — I2510 Atherosclerotic heart disease of native coronary artery without angina pectoris: Secondary | ICD-10-CM | POA: Insufficient documentation

## 2012-04-22 NOTE — Patient Instructions (Addendum)
Stop Imdur  Follow up with [facility] for LVAD placement.

## 2012-04-22 NOTE — Assessment & Plan Note (Signed)
No evidence of ischemia. Continue current regimen.   

## 2012-04-22 NOTE — Progress Notes (Signed)
Patient ID: [name], male   DOB: [birth year], 71 y.o.   MRN: [id number]  HPI:  [name] is 71 y/o male with chroinic systolic heart failure,  ICM EF 20%, CRF Stage 4, severe MR , NSVT, and CAD currently being evaluated for LVAD.  12/18/11 CPX Test- Peak VO2 13.1 Peak predicted 58.6% RER 1.25 max effort VE/VCO2 slope 36.9 Oues 1.38 ve/mvv 85% FEV1 1.88 (55%) FVC 2.64 (59) FEV1/FVC 71%   RHC cath 1/10: RA = 14 RV = 51/10/13 PA = 53/28 (37) PCW = mean 31 with v waves to 45 Fick cardiac output/index = 4.2/1.9 Thermodilution CO/CI = 4.6/2.1 PVR = 1.7 FA sat = 94% PA sat = 52%, 54%   RHC 02/08/12: RA = 10  RV = 48/6/9  PA = 44/17 (31)  PCW = 19 (v = 35)  Fick cardiac output/index = 5.2/2.4  Thermo CO/CI = 4.7/2.1  PVR = 2.0  FA sat = 94%  PA sat = 56%, 54%  VQ 3/13: low prob  Echo (at [facility]) 02/18/12: EF 20-25% infero-lateral scarring. RV mildly dilated mild to moderate HK. Mld TR. Moderate MR.  Has seen Dr.Miano at [facility] for consideration of VAD placement.  He fell and broke arm in March.  Needed to be placed in a sling.  Currently LVAD implantation being held until arm heals.[name]    He returns for follow up today.  Last week he felt poorly, he was treated for bronchitis by Dr. Andee Lineman.  He feels better after abx.  Cough resolved.  Remains very fatigued. Dyspneic on any exertion. Not ambulating much. He currently denies orthopnea/PND. He has not been weighing because his bathroom is under [employer].     ROS: All systems negative except as listed in HPI, PMH and Problem List.  Past Medical History  Diagnosis Date  . Diabetes mellitus   . 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 CAD (coronary artery disease)   . Gout   . Hypotension     ORTHOSTATIC  . DVT of lower extremity (deep venous thrombosis)     left  . Hypertension   . High cholesterol   . Pulmonary embolism on left 1988  . Myocardial infarction 1988  . Angina   . CHF (congestive heart failure)   . Heart  murmur   . Mitral valve disorder   . Pneumonia 1993; 2009    "both serious cases"  . Pneumonia 2010    "not hospitalized"  . Shortness of breath on exertion   . Sleep apnea   . Blood transfusion   . Anemia   . H/O hiatal hernia   . Stomach ulcer 1987-1988  . Jaundice     "@ birth & when I was in 7th grade"  . 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 acetaminophen (TYLENOL) 500 MG tablet Take 500 mg by mouth every 6 (six) hours as needed.      [name] allopurinol (ZYLOPRIM) 100 MG tablet Take 200 mg by mouth at bedtime.      [name] aluminum acetate (ACID MANTLE) CREA Apply 1 application topically as needed.      [name] amoxicillin-clavulanate (AUGMENTIN) 875-125 MG per tablet Take 1 tablet by mouth 2 (two) times daily.  14 tablet  0  . aspirin 81 MG tablet Take 81 mg by mouth every morning.       [name]  bumetanide (BUMEX) 2 MG tablet Take 2 mg by mouth every morning.       . carvedilol (COREG) 6.25 MG tablet Take 6.25 mg by mouth 2 (two) times daily with a meal.       . ergocalciferol (VITAMIN D2) 50000 UNITS capsule Take 50,000 Units by mouth once a week. Take on Thursdays      . fish oil-omega-3 fatty acids 1000 MG capsule Take 1 g by mouth every morning.       . gabapentin (NEURONTIN) 300 MG capsule Take 300 mg by mouth 2 (two) times daily.       [name] LORazepam (ATIVAN) 1 MG tablet Take 1 tablet by mouth At bedtime as needed.      . pravastatin (PRAVACHOL) 40 MG tablet Take 1 tablet by mouth Daily.      . predniSONE (DELTASONE) 20 MG tablet Take 20 mg by mouth daily as needed. Take for gout flare ups      . ranitidine (ZANTAC) 150 MG capsule Take 150 mg by mouth 2 (two) times daily.       . Sulfacetamide Sodium-Sulfur (SUPHERA) 10-5 % CREA Apply 1 application topically daily.      [name] warfarin (COUMADIN) 5 MG tablet TAKE AS DIRECTED PER COUMADIN CLINIC  90 tablet  1     PHYSICAL  EXAM: Filed Vitals:   04/22/12 1103  BP: 84/52  Pulse: 76     General:  Fatigued appearing. No resp. difficulty HEENT: normal Neck: supple. JVP 7-8. Carotids 2+ bilaterally; no bruits. No lymphadenopathy or thryomegaly appreciated. Cor: PMI normal. Irregular rate & rhythm. No rubs, gallops 2/6 MR murmur Lungs: clear with mildly decreased BS Abdomen: soft, nontender, nondistended. No hepatosplenomegaly. No bruits or masses. Good bowel sounds. Extremities: no cyanosis, clubbing, rash,  bilateral compression stockings, trace-1+ edema Neuro: alert & orientedx3, cranial nerves grossly intact. Moves all 4 extremities w/o difficulty. Affect pleasant.    ASSESSMENT & PLAN:

## 2012-04-22 NOTE — Assessment & Plan Note (Signed)
He continues to decline. NYHA IV with hypotension. I feel we need to proceed with mechanical support sooner rather than later. I have discussed with Dr. Romona Curls at [facility] who will attempt to arrange VAD implant later this week or early next week. Discussed in detail with patient and his wife.

## 2012-04-24 ENCOUNTER — Other Ambulatory Visit: Payer: Self-pay | Admitting: Cardiology

## 2012-04-25 ENCOUNTER — Telehealth: Payer: Self-pay | Admitting: *Deleted

## 2012-04-25 NOTE — Telephone Encounter (Signed)
Spoke with patient and informed him that if he has completed a course of antibiotic then another round may not take care of the cough but message will be forwarded to MD for review.

## 2012-04-25 NOTE — Telephone Encounter (Signed)
Patient left message on nurse's voicemail this morning saying he would like a refill on his augmentin rx since he is still coughing. Patient said since he is scheduled for surgery next week he thinks that the additional antibiotic would knock out the cough before his surgery. Please advise.

## 2012-05-02 HISTORY — PX: OTHER SURGICAL HISTORY: SHX169

## 2012-05-08 ENCOUNTER — Encounter: Payer: Medicare Other | Admitting: *Deleted

## 2012-05-11 NOTE — Progress Notes (Signed)
Hayden Bottoms, MD, [facility] ABIM Board Certified in Adult Cardiovascular Medicine,Internal Medicine and Critical Care Medicine    CC: followup patient with congestive heart failure in preparation for LVAD device  HPI:  The patient remains in NYHA class 3/4.  He is unable to do any significant activity.  He has qualified for Heart Mate Implant. Presented with pretibial hold because the patient fell, without syncope but tripped over the lesion of his dog.  He broke his right arm.  This is being treated. From a cardiovascular standpoint there is no significant change in his status.  This was a routine visit awaiting device implant.  PMH: reviewed and listed in Problem List in Electronic Records (and see below) Past Medical History  Diagnosis Date  . Diabetes mellitus   . 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 CAD (coronary artery disease)   . Gout   . Hypotension     ORTHOSTATIC  . DVT of lower extremity (deep venous thrombosis)     left  . Hypertension   . High cholesterol   . Pulmonary embolism on left 1988  . Myocardial infarction 1988  . Angina   . CHF (congestive heart failure)   . Heart murmur   . Mitral valve disorder   . Pneumonia 1993; 2009    "both serious cases"  . Pneumonia 2010    "not hospitalized"  . Shortness of breath on exertion   . Sleep apnea   . Blood transfusion   . Anemia   . H/O hiatal hernia   . Stomach ulcer 1987-1988  . Jaundice     "@ birth & when I was in 7th grade"  . 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 Biv 11/2010    ICD 12/11 MEDTRONIC  . Cataract extraction w/ intraocular lens  implant, bilateral 1990's  . Exploratory laparotomy w/ bowel resection 1993  . Tracheostomy 1993  . Coronary angioplasty 1988  . Coronary angioplasty with stent placement 2003    "1"  . Cardiac catheterization 12/13/11    Allergies/SH/FHX : available in  Electronic Records for review  Allergies  Allergen Reactions  . Levofloxacin Other (See Comments)    Doesn't tolerate  . Sulfonamide Derivatives Other (See Comments)    unknown  . Cefuroxime Rash  . Hydralazine Rash   History   Social History  . Marital Status: Married    Spouse Name: N/A    Number of Children: N/A  . Years of Education: N/A   Occupational History  . Not on file.   Social History Main Topics  . Smoking status: Former Smoker -- 1.0 packs/day for 30 years    Types: Cigarettes    Quit date: 12/03/1986  . Smokeless tobacco: Never Used  . Alcohol Use: No  . Drug Use: No  . Sexually Active: No   Other Topics Concern  . Not on file   Social History Narrative  . No narrative on file   No family history on file.  Medications: Current Outpatient Prescriptions  Medication Sig Dispense Refill  . acetaminophen (TYLENOL) 500 MG tablet Take 500 mg by mouth every 6 (six) hours as needed.      [name] allopurinol (ZYLOPRIM) 100 MG tablet Take 200 mg by mouth at bedtime.      [name] aluminum acetate (ACID MANTLE) CREA Apply 1 application topically as  needed.      [name] aspirin 81 MG tablet Take 81 mg by mouth every morning.       . bumetanide (BUMEX) 2 MG tablet Take 2 mg by mouth every morning.       . carvedilol (COREG) 6.25 MG tablet Take 6.25 mg by mouth 2 (two) times daily with a meal.       . ergocalciferol (VITAMIN D2) 50000 UNITS capsule Take 50,000 Units by mouth once a week. Take on Thursdays      . fish oil-omega-3 fatty acids 1000 MG capsule Take 1 g by mouth every morning.       . gabapentin (NEURONTIN) 300 MG capsule Take 300 mg by mouth 2 (two) times daily.       [name] LORazepam (ATIVAN) 1 MG tablet Take 1 tablet by mouth At bedtime as needed.      . pravastatin (PRAVACHOL) 40 MG tablet Take 1 tablet by mouth Daily.      . predniSONE (DELTASONE) 20 MG tablet Take 20 mg by mouth daily as needed. Take for gout flare ups      . ranitidine (ZANTAC) 150 MG capsule Take 150  mg by mouth 2 (two) times daily.       . Sulfacetamide Sodium-Sulfur (SUPHERA) 10-5 % CREA Apply 1 application topically daily.      [name] warfarin (COUMADIN) 5 MG tablet TAKE AS DIRECTED PER COUMADIN CLINIC  90 tablet  1    ROS: No nausea or vomiting. No fever or chills.No melena or hematochezia.No bleeding.No claudication  Physical Exam: BP 106/68  Pulse 77  Ht 5\' 10"  (1.778 m)  Wt 215 lb (97.523 kg)  BMI 30.85 kg/m2  SpO2 93% General:well-nourished white male with shortness of breath Neck:normal carotid upstroke and no carotid bruits.  No thyromegaly.  No nodes or thyroid.  JVP is 10 cm Lungs:scattered rhonchi bilaterally but no definite crackles. Cardiac:regular rate and rhythm with normal S1 and S2 and S3 present.  2/6 holosystolic murmur at the apex Vascular:1+ edema.  Normal distal pulses bilaterally Skin:warm and dry Physcologic:normal affect  12lead ECG:not obtained Limited bedside ECHO:N/A No images are attached to the encounter.   Assessment and Plan  CHRONIC SYSTOLIC HEART FAILURE Patient had a repeat echocardiogram.  This was noted for LV function of 20-25% with multiple regional wall abnormalities.  There was evidence for elevated left ventricular end-diastolic pressure.  It was also moderate mitral regurgitation and moderate pulmonary hypertension PA systolic pressure 55-60 mmHg.the patient is awaiting implantation of an LVAD Device.  Unfortunately, the patient broke his arm and is awaiting until this problem is resolved.  LEFT VENTRICULAR FUNCTION, DECREASED I made no changes in his medical regimen.  SHORTNESS OF BREATH NYHA class III through 4 but the patient also has some bronchitis with a cough and I gave him a prescription with Augmentin as well as Tussionex.    Patient Active Problem List  Diagnoses  . DM  . HYPERLIPIDEMIA-MIXED  . OBSTRUCTIVE SLEEP APNEA  . CAD, NATIVE VESSEL  . CORONARY ARTERY DISEASE, S/P PTCA  . ATRIAL FIBRILLATION  . LEFT  VENTRICULAR FUNCTION, DECREASED  . DVT  . HYPOTENSION, ORTHOSTATIC  . PULMONARY NODULE  . MESENTERIC VENOUS THROMBOSIS  . OTH SPEC D/O RESULT FROM IMPAIRED RENAL FUNCTION  . PULMONARY EMBOLISM, HX OF  . CHRONIC SYSTOLIC HEART FAILURE  . SHORTNESS OF BREATH  . POSTSURG PERCUT TRANSLUMINAL COR ANGPLSTY STS  . CARDIAC ARREST  . Encounter for long-term (current) use of  anticoagulants  . Edema  . Peripheral neuropathy  . Acute on chronic systolic heart failure  . Mitral regurgitation  . Renal insufficiency

## 2012-05-11 NOTE — Assessment & Plan Note (Signed)
I made no changes in his medical regimen.

## 2012-05-11 NOTE — Assessment & Plan Note (Signed)
NYHA class III through 4 but the patient also has some bronchitis with a cough and I gave him a prescription with Augmentin as well as Tussionex.

## 2012-05-11 NOTE — Assessment & Plan Note (Addendum)
Patient had a repeat echocardiogram.  This was noted for LV function of 20-25% with multiple regional wall abnormalities.  There was evidence for elevated left ventricular end-diastolic pressure.  It was also moderate mitral regurgitation and moderate pulmonary hypertension PA systolic pressure 55-60 mmHg.the patient is awaiting implantation of an LVAD Device.  Unfortunately, the patient broke his arm and is awaiting until this problem is resolved.

## 2012-05-12 NOTE — Telephone Encounter (Signed)
Left message for patient to call office.  

## 2012-05-12 NOTE — Telephone Encounter (Signed)
Noted  

## 2012-05-12 NOTE — Telephone Encounter (Signed)
He can get refill but suspect cough is from CHF. He should have had his surgery- if not he can have refill

## 2012-05-12 NOTE — Telephone Encounter (Signed)
Wife informed and said patient is now at [facility] recovering from his surgery and is currently on plenty of antibiotics. Nurse apologized for delay in response and she verbalized understanding. Wife said patient is doing good.

## 2012-05-19 ENCOUNTER — Encounter: Payer: Self-pay | Admitting: *Deleted

## 2012-05-27 DIAGNOSIS — D680 Von Willebrand's disease: Secondary | ICD-10-CM | POA: Insufficient documentation

## 2012-05-28 ENCOUNTER — Encounter: Payer: Self-pay | Admitting: Internal Medicine

## 2012-05-28 ENCOUNTER — Ambulatory Visit (INDEPENDENT_AMBULATORY_CARE_PROVIDER_SITE_OTHER): Payer: Medicare Other | Admitting: *Deleted

## 2012-05-28 DIAGNOSIS — I469 Cardiac arrest, cause unspecified: Secondary | ICD-10-CM

## 2012-05-28 DIAGNOSIS — I5023 Acute on chronic systolic (congestive) heart failure: Secondary | ICD-10-CM

## 2012-05-28 LAB — REMOTE ICD DEVICE
ATRIAL PACING ICD: 4.41 pct
BAMS-0001: 170 {beats}/min
CHARGE TIME: 9.138 s
FVT: 0
PACEART VT: 0
RV LEAD AMPLITUDE: 3.1 mv
TOT-0001: 0
TZAT-0001FASTVT: 1
TZAT-0002ATACH: NEGATIVE
TZAT-0002ATACH: NEGATIVE
TZAT-0004SLOWVT: 8
TZAT-0005SLOWVT: 88 pct
TZAT-0012ATACH: 150 ms
TZAT-0012SLOWVT: 200 ms
TZAT-0013SLOWVT: 3
TZAT-0018ATACH: NEGATIVE
TZAT-0018FASTVT: NEGATIVE
TZAT-0019ATACH: 6 V
TZAT-0020ATACH: 1.5 ms
TZAT-0020ATACH: 1.5 ms
TZAT-0020ATACH: 1.5 ms
TZON-0003SLOWVT: 340 ms
TZON-0003VSLOWVT: 450 ms
TZON-0004SLOWVT: 20
TZST-0001FASTVT: 2
TZST-0001FASTVT: 4
TZST-0001FASTVT: 6
TZST-0001SLOWVT: 3
TZST-0001SLOWVT: 5
TZST-0002ATACH: NEGATIVE
TZST-0002ATACH: NEGATIVE
TZST-0002FASTVT: NEGATIVE
TZST-0003SLOWVT: 35 J
TZST-0003SLOWVT: 35 J
VENTRICULAR PACING ICD: 67.41 pct

## 2012-06-16 ENCOUNTER — Encounter: Payer: Self-pay | Admitting: *Deleted

## 2012-06-23 ENCOUNTER — Encounter: Payer: Self-pay | Admitting: Internal Medicine

## 2012-07-10 ENCOUNTER — Telehealth: Payer: Self-pay | Admitting: Cardiology

## 2012-07-10 NOTE — Telephone Encounter (Signed)
INR 1.3 yesterday at [facility] and he stated that they are scared he will develop a clot.  Would like his levels checked tomorrow even though [facility] wants to check Tuesday according to him.

## 2012-07-10 NOTE — Telephone Encounter (Signed)
States [facility] told him to take coumadin 5mg  x 3 days then increase dose to 2.5mg  qd except 5mg  on T,Th,Sat. And started him on Lovenox bid x 7 days.  Appt made for INR check in [place] office on 8/13.  Pt verbalized understanding.

## 2012-07-15 ENCOUNTER — Ambulatory Visit (INDEPENDENT_AMBULATORY_CARE_PROVIDER_SITE_OTHER): Payer: Medicare Other | Admitting: *Deleted

## 2012-07-15 DIAGNOSIS — Z86718 Personal history of other venous thrombosis and embolism: Secondary | ICD-10-CM

## 2012-07-15 DIAGNOSIS — I4891 Unspecified atrial fibrillation: Secondary | ICD-10-CM

## 2012-07-15 DIAGNOSIS — K55059 Acute (reversible) ischemia of intestine, part and extent unspecified: Secondary | ICD-10-CM

## 2012-07-15 DIAGNOSIS — Z7901 Long term (current) use of anticoagulants: Secondary | ICD-10-CM

## 2012-07-15 DIAGNOSIS — I82409 Acute embolism and thrombosis of unspecified deep veins of unspecified lower extremity: Secondary | ICD-10-CM

## 2012-07-15 LAB — POCT INR: INR: 2

## 2012-07-15 NOTE — Patient Instructions (Signed)
Pt will call for INR appt here when advised by [facility]

## 2012-07-18 ENCOUNTER — Telehealth: Payer: Self-pay | Admitting: *Deleted

## 2012-07-18 NOTE — Telephone Encounter (Signed)
Received phone call and fax regarding this patients need for VP INR due to recent LVAD. Have talked with lab personnel at [facility] regarding orders for INR VP, will fax order to [phone], also gave wife instructions to come by our office to pick up  a copy of this order 8/19 and keep with her as suggested by lab personnel, have instructed wife how to register patient thru ED at [facility] for INR 07/22/2012, there is a fax number on script where lab is to fax results, there is also a number on script if they need to call for a critical lab result. I spoke with Jean Rosenthal at [facility] ventricular assisted device program, order is signed by Bryon Lions, NP, VAD coordinator. They also request a copy of flowsheet with pts INR result to [phone]. Phone number is [phone].

## 2012-08-22 DIAGNOSIS — I4891 Unspecified atrial fibrillation: Secondary | ICD-10-CM

## 2012-08-22 DIAGNOSIS — I471 Supraventricular tachycardia: Secondary | ICD-10-CM

## 2012-08-22 DIAGNOSIS — I472 Ventricular tachycardia: Secondary | ICD-10-CM

## 2012-09-01 ENCOUNTER — Encounter: Payer: Self-pay | Admitting: Internal Medicine

## 2012-09-01 ENCOUNTER — Ambulatory Visit (INDEPENDENT_AMBULATORY_CARE_PROVIDER_SITE_OTHER): Payer: Medicare Other | Admitting: *Deleted

## 2012-09-01 DIAGNOSIS — I469 Cardiac arrest, cause unspecified: Secondary | ICD-10-CM

## 2012-09-01 DIAGNOSIS — I5022 Chronic systolic (congestive) heart failure: Secondary | ICD-10-CM

## 2012-09-01 DIAGNOSIS — I4891 Unspecified atrial fibrillation: Secondary | ICD-10-CM

## 2012-09-01 LAB — REMOTE ICD DEVICE
AL AMPLITUDE: 0.4 mv
AL IMPEDENCE ICD: 456 Ohm
BATTERY VOLTAGE: 2.9855 V
BRDY-0004LV: 120 {beats}/min
LV LEAD IMPEDENCE ICD: 703 Ohm
LV LEAD THRESHOLD: 2 V
RV LEAD AMPLITUDE: 2.3 mv
RV LEAD IMPEDENCE ICD: 399 Ohm
TOT-0002: 0
TOT-0006: 20111227000000
TZAT-0001ATACH: 3
TZAT-0001SLOWVT: 1
TZAT-0002ATACH: NEGATIVE
TZAT-0002FASTVT: NEGATIVE
TZAT-0004SLOWVT: 8
TZAT-0005SLOWVT: 88 pct
TZAT-0012ATACH: 150 ms
TZAT-0012ATACH: 150 ms
TZAT-0012ATACH: 150 ms
TZAT-0018FASTVT: NEGATIVE
TZAT-0019ATACH: 6 V
TZAT-0019FASTVT: 8 V
TZAT-0020ATACH: 1.5 ms
TZAT-0020ATACH: 1.5 ms
TZON-0003ATACH: 350 ms
TZON-0003VSLOWVT: 450 ms
TZON-0004SLOWVT: 20
TZST-0001ATACH: 4
TZST-0001ATACH: 5
TZST-0001ATACH: 6
TZST-0001FASTVT: 5
TZST-0001FASTVT: 6
TZST-0001SLOWVT: 2
TZST-0001SLOWVT: 5
TZST-0002ATACH: NEGATIVE
TZST-0002ATACH: NEGATIVE
TZST-0002FASTVT: NEGATIVE
TZST-0002FASTVT: NEGATIVE
TZST-0002FASTVT: NEGATIVE
TZST-0003SLOWVT: 35 J
TZST-0003SLOWVT: 35 J
TZST-0003SLOWVT: 35 J

## 2012-09-02 NOTE — Progress Notes (Signed)
Remote defib check w/icm  

## 2012-09-23 ENCOUNTER — Telehealth: Payer: Self-pay | Admitting: *Deleted

## 2012-09-23 NOTE — Telephone Encounter (Signed)
Bernette Redbird cox  L- Vac coordinator at [facility], would like for Dr. Johney Frame to see pt.  Because he said, pt's defibrillator is stimulating his diaphragm specially When pt turn to his left side, this is  keep pt awake during the night. Bernette Redbird states pt needs to be seen soon. A Copy of notes will be faxed to Dr. Johney Frame today. You can call the coordinator at [phone].

## 2012-09-24 ENCOUNTER — Ambulatory Visit: Payer: Self-pay | Admitting: *Deleted

## 2012-09-24 DIAGNOSIS — I82409 Acute embolism and thrombosis of unspecified deep veins of unspecified lower extremity: Secondary | ICD-10-CM

## 2012-09-24 DIAGNOSIS — Z7901 Long term (current) use of anticoagulants: Secondary | ICD-10-CM

## 2012-09-24 DIAGNOSIS — I4891 Unspecified atrial fibrillation: Secondary | ICD-10-CM

## 2012-09-24 DIAGNOSIS — K55059 Acute (reversible) ischemia of intestine, part and extent unspecified: Secondary | ICD-10-CM

## 2012-09-24 NOTE — Telephone Encounter (Signed)
LMOVM at [phone] for Bernette Redbird Cox to return call/kwm

## 2012-10-15 ENCOUNTER — Other Ambulatory Visit: Payer: Self-pay | Admitting: Cardiology

## 2012-11-12 DIAGNOSIS — M109 Gout, unspecified: Secondary | ICD-10-CM | POA: Insufficient documentation

## 2012-11-13 ENCOUNTER — Encounter: Payer: Self-pay | Admitting: *Deleted

## 2012-11-13 DIAGNOSIS — Z95810 Presence of automatic (implantable) cardiac defibrillator: Secondary | ICD-10-CM | POA: Insufficient documentation

## 2012-11-19 ENCOUNTER — Encounter: Payer: Self-pay | Admitting: Internal Medicine

## 2012-11-19 ENCOUNTER — Ambulatory Visit (INDEPENDENT_AMBULATORY_CARE_PROVIDER_SITE_OTHER): Payer: Medicare Other | Admitting: Internal Medicine

## 2012-11-19 VITALS — BP 110/80 | HR 76 | Ht 70.0 in | Wt 223.0 lb

## 2012-11-19 DIAGNOSIS — I4891 Unspecified atrial fibrillation: Secondary | ICD-10-CM

## 2012-11-19 DIAGNOSIS — T82198A Other mechanical complication of other cardiac electronic device, initial encounter: Secondary | ICD-10-CM

## 2012-11-19 DIAGNOSIS — T82110A Breakdown (mechanical) of cardiac electrode, initial encounter: Secondary | ICD-10-CM

## 2012-11-19 DIAGNOSIS — T82118A Breakdown (mechanical) of other cardiac electronic device, initial encounter: Secondary | ICD-10-CM

## 2012-11-19 DIAGNOSIS — I469 Cardiac arrest, cause unspecified: Secondary | ICD-10-CM

## 2012-11-19 DIAGNOSIS — I5022 Chronic systolic (congestive) heart failure: Secondary | ICD-10-CM

## 2012-11-19 LAB — ICD DEVICE OBSERVATION
AL IMPEDENCE ICD: 456 Ohm
ATRIAL PACING ICD: 2.41 pct
CHARGE TIME: 9.869 s
LV LEAD IMPEDENCE ICD: 760 Ohm
RV LEAD THRESHOLD: 3.25 V
TOT-0001: 0
TOT-0006: 20111227000000
TZAT-0001ATACH: 2
TZAT-0001ATACH: 3
TZAT-0002ATACH: NEGATIVE
TZAT-0002FASTVT: NEGATIVE
TZAT-0005SLOWVT: 88 pct
TZAT-0011SLOWVT: 10 ms
TZAT-0012ATACH: 150 ms
TZAT-0012ATACH: 150 ms
TZAT-0012ATACH: 150 ms
TZAT-0013SLOWVT: 3
TZAT-0018ATACH: NEGATIVE
TZAT-0019ATACH: 6 V
TZAT-0019FASTVT: 8 V
TZAT-0020ATACH: 1.5 ms
TZAT-0020FASTVT: 1.5 ms
TZON-0003ATACH: 350 ms
TZON-0003VSLOWVT: 450 ms
TZON-0004SLOWVT: 32
TZON-0004VSLOWVT: 32
TZON-0005SLOWVT: 12
TZST-0001ATACH: 4
TZST-0001ATACH: 6
TZST-0001FASTVT: 3
TZST-0001SLOWVT: 3
TZST-0001SLOWVT: 5
TZST-0002ATACH: NEGATIVE
TZST-0002ATACH: NEGATIVE
TZST-0002FASTVT: NEGATIVE
TZST-0002FASTVT: NEGATIVE
TZST-0003SLOWVT: 35 J
TZST-0003SLOWVT: 35 J
VENTRICULAR PACING ICD: 96.71 pct
VF: 0

## 2012-11-19 NOTE — Patient Instructions (Signed)
   Chest x-ray - today  Continue all current medications. Follow up next clinic

## 2012-11-20 ENCOUNTER — Encounter: Payer: Self-pay | Admitting: Internal Medicine

## 2012-11-20 DIAGNOSIS — T82110A Breakdown (mechanical) of cardiac electrode, initial encounter: Secondary | ICD-10-CM | POA: Insufficient documentation

## 2012-11-20 NOTE — Progress Notes (Signed)
PCP:  Ignatius Specking., MD Primary Cardiologist;  Dr Gala Romney, also followed by the [facility] transplant team  The patient presents today for electrophysiology followup.  He underwent LVAD placement at [facility] 5/13 and has not been seen by [place] since that time.  He reports significant improvement in stamina, SOB, and exercise tolerance since his device was implanted.  Remote check 9/13 revealed RV lead R wave change and per Dr Gala Romney Dr Allena Katz at [facility] was notified.  The patient reports that his device was interrogated at [facility] but does not recall discussion of findings with anyone from EP there.   Recently, he has begun having diaphragmatic stimulation.  This occurs at night when lying on his left side.   Today, he denies symptoms of palpitations, chest pain, shortness of breath, orthopnea, PND, dizziness, presyncope, syncope, or neurologic sequela.  The patient feels that he is tolerating medications without difficulties and is otherwise without complaint today.   Past Medical History  Diagnosis Date  . Diabetes mellitus   . 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 CAD (coronary artery disease)   . Gout   . Hypotension     ORTHOSTATIC  . DVT of lower extremity (deep venous thrombosis)     left  . Hypertension   . High cholesterol   . Pulmonary embolism on left 1988  . Myocardial infarction 1988  . Angina   . CHF (congestive heart failure)   . Heart murmur   . Mitral valve disorder   . Pneumonia 1993; 2009    "both serious cases"  . Pneumonia 2010    "not hospitalized"  . Shortness of breath on exertion   . Sleep apnea   . Blood transfusion   . Anemia   . H/O hiatal hernia   . Stomach ulcer 1987-1988  . Jaundice     "@ birth & when I was in 7th grade"  . 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 Biv 11/2010    ICD 12/11 MEDTRONIC  . Cataract extraction w/ intraocular lens  implant,  bilateral 1990's  . Exploratory laparotomy w/ bowel resection 1993  . Tracheostomy 1993  . Coronary angioplasty 1988  . Coronary angioplasty with stent placement 2003    "1"  . Cardiac catheterization 12/13/11  . Leff ventricular assist device (lvad) 05/02/12    LVAD implanted at [facility]    Current Outpatient Prescriptions  Medication Sig Dispense Refill  . acetaminophen (TYLENOL) 500 MG tablet Take 500 mg by mouth every 6 (six) hours as needed.      [name] allopurinol (ZYLOPRIM) 100 MG tablet Take 200 mg by mouth daily.      [name] aspirin 81 MG tablet Take 81 mg by mouth every morning.       . bumetanide (BUMEX) 2 MG tablet Take 2 mg by mouth every morning.       . carvedilol (COREG) 6.25 MG tablet Take 6.25 mg by mouth 2 (two) times daily with a meal.       . Cholecalciferol (VITAMIN D-3) 1000 UNITS CAPS Take 2,000 Units by mouth daily.      [name] doxycycline (DORYX) 100 MG DR capsule Take 100 mg by mouth 2 (two) times daily.      . febuxostat (ULORIC) 40 MG tablet Take 40 mg by mouth daily.      . fish oil-omega-3  fatty acids 1000 MG capsule Take 1 g by mouth every morning.       . furosemide (LASIX) 40 MG tablet Take 20 mg by mouth daily.      [name] gabapentin (NEURONTIN) 300 MG capsule Take 300 mg by mouth 3 (three) times daily.       [name] HYDROcodone-acetaminophen (NORCO/VICODIN) 5-325 MG per tablet Take 1 tablet by mouth every 6 (six) hours as needed.      . hydrocortisone 2.5 % cream Apply 1 application topically 2 (two) times daily.      [name] LORazepam (ATIVAN) 1 MG tablet Take 0.5 mg by mouth At bedtime as needed.       . Multiple Vitamin (MULTIVITAMIN) tablet Take 1 tablet by mouth daily.      . pravastatin (PRAVACHOL) 40 MG tablet TAKE ONE TABLET BY MOUTH AT BEDTIME  30 tablet  2  . ranitidine (ZANTAC) 150 MG capsule Take 150 mg by mouth 2 (two) times daily.       [name] triamcinolone cream (KENALOG) 0.1 % Apply 1 application topically as needed.      . warfarin (COUMADIN) 5 MG tablet TAKE AS DIRECTED PER  COUMADIN CLINIC  90 tablet  1    Allergies  Allergen Reactions  . Levofloxacin Other (See Comments)    Doesn't tolerate  . Sulfonamide Derivatives Other (See Comments)    unknown  . Cefuroxime Rash  . Hydralazine Rash    History   Social History  . Marital Status: Married    Spouse Name: N/A    Number of Children: N/A  . Years of Education: N/A   Occupational History  . Not on file.   Social History Main Topics  . Smoking status: Former Smoker -- 1.0 packs/day for 30 years    Types: Cigarettes    Quit date: 12/03/1986  . Smokeless tobacco: Never Used  . Alcohol Use: No  . Drug Use: No  . Sexually Active: No   Other Topics Concern  . Not on file   Social History Narrative  . No narrative on file    No family history on file.  ROS-  All systems are reviewed and are negative except as outlined in the HPI above   Physical Exam: Filed Vitals:   11/19/12 1135  BP: 110/80  Pulse: 76  Height: 5\' 10"  (1.778 m)  Weight: 223 lb (101.152 kg)  SpO2: 97%    GEN- The patient is well appearing, alert and oriented x 3 today.  He has an LVAD Head- normocephalic, atraumatic Eyes-  Sclera clear, conjunctiva pink Ears- hearing intact Oropharynx- clear Neck- supple,  Lungs- Clear to ausculation bilaterally, normal work of breathing Chest- ICD pocket is well healed Heart- LVAD hum is prominent GI- soft, NT, ND, + BS Extremities- no clubbing, cyanosis, or edema MS- diffuse muscle atrophy Skin- no rash or lesion Psych- euthymic mood, full affect Neuro- strength and sensation are intact  ICD interrogation- reviewed in detail today,  See PACEART report  Assessment and Plan:  1. BIV ICD lead issue His RV lead threshold is significantly elevated and R waves are now 2.  This temporally corresponds to his LVAD implant.  An Xray is now obtained and reveals that the ventricular lead is not significantly altered in its location.  I suspect that he did however have a  dislodgement during placement of the LVAD cannula.  The LV lead threshold appears stable.  I have turned down LV output to 2V today to try  to minimize diaphragmatic stimulation.  We could try other pacing configurations if diaphragmatic stimulation returns. I think that the real question at this time is does the patient require revision of his RV lead.  We have no clear data to guide [place] as to whether patients with LVADs receive benefit from CRT or defibrillation therapy.  I will send him back to [facility] to be evaluated by Dr Wilford Grist with EP in direct discussion with the transplant team. I think that a reasonable approach would be to turn pacing off (either LV only pacing or program VVI 40) and follow clinically.  If he were to deteriorate then lead revision would be reasonable.   I have spoken with Mr Cox (NP with [facility] Transplant team) and also with Dr Wilford Grist. They will arrange for EP evaluation at [facility] within the next 2-3 weeks.  2. Chronic systolic dysfunction Clinically much improved s/p LVAD placement He will continue to follow closely at [facility]  3. Afib Stable Continue long term anticoagulation  He will return to see me in 3 months

## 2012-12-03 HISTORY — PX: HIP FRACTURE SURGERY: SHX118

## 2012-12-09 ENCOUNTER — Telehealth: Payer: Self-pay | Admitting: Internal Medicine

## 2012-12-09 NOTE — Telephone Encounter (Signed)
Mr [name] called stating that has been scheduled in [place] office on 12-31-12 with Dr. Johney Frame. He states that he has appointment With his doctors at [facility] on the same date.  He is requesting to check with Dr. Johney Frame about the appointment he has in the  [facility].

## 2012-12-10 NOTE — Telephone Encounter (Signed)
We can move his appt with Dr Johney Frame until February since pt is not being seen at [facility] until then.

## 2012-12-15 ENCOUNTER — Ambulatory Visit: Payer: Medicare Other | Admitting: Internal Medicine

## 2012-12-31 ENCOUNTER — Ambulatory Visit: Payer: Medicare Other | Admitting: Internal Medicine

## 2013-01-21 ENCOUNTER — Encounter: Payer: Self-pay | Admitting: Internal Medicine

## 2013-01-21 ENCOUNTER — Ambulatory Visit (INDEPENDENT_AMBULATORY_CARE_PROVIDER_SITE_OTHER): Payer: Medicare Other | Admitting: Internal Medicine

## 2013-01-21 VITALS — BP 124/60 | HR 88 | Ht 70.0 in | Wt 229.0 lb

## 2013-01-21 DIAGNOSIS — I5023 Acute on chronic systolic (congestive) heart failure: Secondary | ICD-10-CM

## 2013-01-21 DIAGNOSIS — T82198A Other mechanical complication of other cardiac electronic device, initial encounter: Secondary | ICD-10-CM

## 2013-01-21 DIAGNOSIS — I469 Cardiac arrest, cause unspecified: Secondary | ICD-10-CM

## 2013-01-21 DIAGNOSIS — I4891 Unspecified atrial fibrillation: Secondary | ICD-10-CM

## 2013-01-21 LAB — ICD DEVICE OBSERVATION
AL AMPLITUDE: 0.8 mv
AL IMPEDENCE ICD: 456 Ohm
ATRIAL PACING ICD: 2.46 pct
CHARGE TIME: 10.66 s
LV LEAD IMPEDENCE ICD: 760 Ohm
LV LEAD THRESHOLD: 1.25 V
RV LEAD IMPEDENCE ICD: 342 Ohm
TOT-0001: 0
TOT-0002: 0
TOT-0006: 20111227000000
TZAT-0001ATACH: 1
TZAT-0001ATACH: 2
TZAT-0001FASTVT: 1
TZAT-0002ATACH: NEGATIVE
TZAT-0002ATACH: NEGATIVE
TZAT-0002FASTVT: NEGATIVE
TZAT-0011SLOWVT: 10 ms
TZAT-0012ATACH: 150 ms
TZAT-0012SLOWVT: 200 ms
TZAT-0018ATACH: NEGATIVE
TZAT-0018ATACH: NEGATIVE
TZAT-0018ATACH: NEGATIVE
TZAT-0019ATACH: 6 V
TZAT-0019SLOWVT: 8 V
TZAT-0020FASTVT: 1.5 ms
TZON-0003ATACH: 350 ms
TZON-0004VSLOWVT: 32
TZON-0005SLOWVT: 12
TZST-0001FASTVT: 3
TZST-0001FASTVT: 4
TZST-0001FASTVT: 5
TZST-0001SLOWVT: 3
TZST-0001SLOWVT: 6
TZST-0002ATACH: NEGATIVE
TZST-0002ATACH: NEGATIVE
TZST-0002FASTVT: NEGATIVE
TZST-0003SLOWVT: 35 J
VENTRICULAR PACING ICD: 96.87 pct

## 2013-01-21 NOTE — Patient Instructions (Signed)
Continue all current medications. Follow up in  3 months 

## 2013-01-22 ENCOUNTER — Other Ambulatory Visit: Payer: Self-pay | Admitting: Physician Assistant

## 2013-01-25 NOTE — Progress Notes (Signed)
PCP:  Ignatius Specking., MD Primary Cardiologist;  Dr Gala Romney, also followed by the [facility] transplant team  The patient presents today for electrophysiology followup.  He underwent LVAD placement at [facility] 5/13.  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  I spoke with the transplant team at [facility] and also Dr Wilford Grist at [facility] and patient was to be seen by them for consideration of lead revision.  He states that he has not heard from [facility] since that time.  His system has not been evaluated or revised. His diaphragmatic stim has resolved.  Today, he denies symptoms of palpitations, chest pain, shortness of breath, orthopnea, PND, dizziness, presyncope, syncope, or neurologic sequela.  The patient feels that he is tolerating medications without difficulties and is otherwise without complaint today.   Past Medical History  Diagnosis Date  . Diabetes mellitus   . 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 CAD (coronary artery disease)   . Gout   . Hypotension     ORTHOSTATIC  . DVT of lower extremity (deep venous thrombosis)     left  . Hypertension   . High cholesterol   . Pulmonary embolism on left 1988  . Myocardial infarction 1988  . Angina   . CHF (congestive heart failure)   . Heart murmur   . Mitral valve disorder   . Pneumonia 1993; 2009    "both serious cases"  . Pneumonia 2010    "not hospitalized"  . Shortness of breath on exertion   . Sleep apnea   . Blood transfusion   . Anemia   . H/O hiatal hernia   . Stomach ulcer 1987-1988  . Jaundice     "@ birth & when I was in 7th grade"  . 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 Biv  11/2010    ICD 12/11 MEDTRONIC  . Cataract extraction w/ intraocular lens  implant, bilateral  1990's  . Exploratory laparotomy w/ bowel resection  1993  . Tracheostomy  1993  . Coronary angioplasty  1988  .  Coronary angioplasty with stent placement  2003    "1"  . Cardiac catheterization  12/13/11  . Leff ventricular assist device (lvad)  05/02/12    LVAD implanted at [facility]    Current Outpatient Prescriptions  Medication Sig Dispense Refill  . acetaminophen (TYLENOL) 500 MG tablet Take 500 mg by mouth every 6 (six) hours as needed.      [name] aspirin 325 MG tablet Take 325 mg by mouth daily.      . carvedilol (COREG) 6.25 MG tablet Take 6.25 mg by mouth 2 (two) times daily with a meal.       . Cholecalciferol (VITAMIN D-3) 1000 UNITS CAPS Take 2,000 Units by mouth daily.      . furosemide (LASIX) 40 MG tablet Take 20 mg by mouth daily.      [name] gabapentin (NEURONTIN) 300 MG capsule Take 300 mg by mouth 3 (three) times daily.       [name] HYDROcodone-acetaminophen (VICODIN) 5-500 MG per tablet Take 1 tablet by mouth every 6 (six) hours as needed for pain.      [name] LORazepam (ATIVAN) 1 MG tablet Take 0.5 mg by mouth At bedtime as needed.       . Multiple Vitamin (MULTIVITAMIN)  tablet Take 1 tablet by mouth daily.      . ranitidine (ZANTAC) 150 MG capsule Take 150 mg by mouth 2 (two) times daily.       . simvastatin (ZOCOR) 40 MG tablet Take 40 mg by mouth every evening.      . warfarin (COUMADIN) 5 MG tablet TAKE AS DIRECTED PER COUMADIN CLINIC  90 tablet  1  . pravastatin (PRAVACHOL) 40 MG tablet TAKE ONE TABLET BY MOUTH AT BEDTIME. PATIENT NEEDS TO MAKE A APPOINTMENT IN THE [facility] OFFICE.  30 tablet  0   No current facility-administered medications for this visit.    Allergies  Allergen Reactions  . Levofloxacin Other (See Comments)    Doesn't tolerate  . Sulfonamide Derivatives Other (See Comments)    unknown  . Cefuroxime Rash  . Hydralazine Rash    History   Social History  . Marital Status: Married    Spouse Name: N/A    Number of Children: N/A  . Years of Education: N/A   Occupational History  . Not on file.   Social History Main Topics  . Smoking status: Former Smoker -- 1.00  packs/day for 30 years    Types: Cigarettes    Quit date: 12/03/1986  . Smokeless tobacco: Never Used  . Alcohol Use: No  . Drug Use: No  . Sexually Active: No   Other Topics Concern  . Not on file   Social History Narrative  . No narrative on file    No family history on file.  ROS-  All systems are reviewed and are negative except as outlined in the HPI above   Physical Exam: Filed Vitals:   01/21/13 1033  BP: 124/60  Pulse: 88  Height: 5\' 10"  (1.778 m)  Weight: 229 lb (103.874 kg)  SpO2: 97%    GEN- The patient is well appearing, alert and oriented x 3 today.  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  I will again plan to refer him to [facility] for their opinion as this is where he receives LVAD care.   I think that the real question is does the patient require revision of his RV lead.  We have no clear data to guide [place] as to whether patients with LVADs receive benefit from CRT or defibrillation therapy.   I will again refer him back to [facility] to be evaluated by Dr Wilford Grist with EP in direct discussion with the transplant team. I think that a reasonable approach would be to turn pacing off (either LV only pacing or program VVI 40) and follow clinically.  If he were to deteriorate then lead revision would be reasonable.   2. Chronic systolic dysfunction Clinically much improved s/p LVAD placement He will continue to  follow closely at [facility]  3. Afib Stable Continue long term anticoagulation  He will return to see me in 3 months

## 2013-02-11 ENCOUNTER — Encounter: Payer: Medicare Other | Admitting: Internal Medicine

## 2013-02-20 ENCOUNTER — Telehealth: Payer: Self-pay | Admitting: Internal Medicine

## 2013-02-20 NOTE — Telephone Encounter (Signed)
New problem    Pt saw dr Wilford Grist- he made adjustments to his dfib on 02/10/13 & adjustments on meds and pt/wife wanted to let dr allred know that he's been feeling something like a thump on the left side near dfib/pace maker area-unsure if pt needs to be seen or not

## 2013-02-20 NOTE — Telephone Encounter (Signed)
Patient is c/o rapid heart rates and thumping and is requesting office visit with Dr. Johney Frame after being seen at [facility].  Appointment given for 3/26 @ 1:45pm.

## 2013-02-25 ENCOUNTER — Encounter: Payer: Self-pay | Admitting: Internal Medicine

## 2013-02-25 ENCOUNTER — Ambulatory Visit (INDEPENDENT_AMBULATORY_CARE_PROVIDER_SITE_OTHER): Payer: Medicare Other | Admitting: Internal Medicine

## 2013-02-25 VITALS — BP 86/0 | HR 90 | Ht 70.0 in | Wt 218.0 lb

## 2013-02-25 DIAGNOSIS — I2510 Atherosclerotic heart disease of native coronary artery without angina pectoris: Secondary | ICD-10-CM

## 2013-02-25 DIAGNOSIS — I5022 Chronic systolic (congestive) heart failure: Secondary | ICD-10-CM

## 2013-02-25 DIAGNOSIS — I4891 Unspecified atrial fibrillation: Secondary | ICD-10-CM

## 2013-02-25 LAB — ICD DEVICE OBSERVATION
AL AMPLITUDE: 0.8 mv
ATRIAL PACING ICD: 0 pct
BATTERY VOLTAGE: 2.8151 V
FVT: 0
LV LEAD THRESHOLD: 1.5 V
RV LEAD AMPLITUDE: 1.9 mv
RV LEAD IMPEDENCE ICD: 361 Ohm
RV LEAD THRESHOLD: 3 V
TZAT-0001ATACH: 3
TZAT-0001SLOWVT: 1
TZAT-0002ATACH: NEGATIVE
TZAT-0002ATACH: NEGATIVE
TZAT-0004SLOWVT: 8
TZAT-0005SLOWVT: 88 pct
TZAT-0012ATACH: 150 ms
TZAT-0012ATACH: 150 ms
TZAT-0012FASTVT: 200 ms
TZAT-0012SLOWVT: 200 ms
TZAT-0013SLOWVT: 3
TZAT-0018ATACH: NEGATIVE
TZAT-0018FASTVT: NEGATIVE
TZAT-0019ATACH: 6 V
TZAT-0019ATACH: 6 V
TZAT-0019FASTVT: 8 V
TZAT-0020ATACH: 1.5 ms
TZAT-0020ATACH: 1.5 ms
TZAT-0020ATACH: 1.5 ms
TZAT-0020FASTVT: 1.5 ms
TZAT-0020SLOWVT: 1.5 ms
TZON-0003SLOWVT: 340 ms
TZON-0003VSLOWVT: 450 ms
TZON-0004SLOWVT: 32
TZST-0001ATACH: 5
TZST-0001ATACH: 6
TZST-0001FASTVT: 2
TZST-0001FASTVT: 6
TZST-0001SLOWVT: 2
TZST-0001SLOWVT: 4
TZST-0001SLOWVT: 5
TZST-0002ATACH: NEGATIVE
TZST-0002FASTVT: NEGATIVE
TZST-0002FASTVT: NEGATIVE
TZST-0002FASTVT: NEGATIVE
TZST-0003SLOWVT: 35 J
TZST-0003SLOWVT: 35 J
TZST-0003SLOWVT: 35 J
VF: 0

## 2013-02-25 MED ORDER — CARVEDILOL 25 MG PO TABS: 25.0000 mg | ORAL_TABLET | Freq: Two times a day (BID) | ORAL | Status: DC

## 2013-02-25 MED ORDER — FUROSEMIDE 40 MG PO TABS: ORAL_TABLET | ORAL | Status: DC

## 2013-02-25 NOTE — Addendum Note (Signed)
Addended by: Dennis Bast F on: 02/25/2013 04:04 PM   Modules accepted: Orders

## 2013-02-25 NOTE — Progress Notes (Signed)
PCP:  Ignatius Specking., MD Primary Cardiologist;  Dr Gala Romney, also followed by the [facility] transplant team  The patient presents today for electrophysiology followup.  He underwent LVAD placement at [facility] 5/13.  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 He was seen by Dr Wilford Grist at [facility]. Upon discussion, we decided that it was best to not revise his RV lead.  It was programmed subthershold.  His LV lead output was increased.  With this his diaphragmatic stim has returned.  He reports increased fatigue since increasing his coreg.   Today, he denies symptoms of palpitations, chest pain, shortness of breath, orthopnea, PND, dizziness, presyncope, syncope, or neurologic sequela.  The patient feels that he is tolerating medications without difficulties and is otherwise without complaint today.   Past Medical History  Diagnosis Date  . Diabetes mellitus   . 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 CAD (coronary artery disease)   . Gout   . Hypotension     ORTHOSTATIC  . DVT of lower extremity (deep venous thrombosis)     left  . Hypertension   . High cholesterol   . Pulmonary embolism on left 1988  . Myocardial infarction 1988  . Angina   . CHF (congestive heart failure)   . Heart murmur   . Mitral valve disorder   . Pneumonia 1993; 2009    "both serious cases"  . Pneumonia 2010    "not hospitalized"  . Shortness of breath on exertion   . Sleep apnea   . Blood transfusion   . Anemia   . H/O hiatal hernia   . Stomach ulcer 1987-1988  . Jaundice     "@ birth & when I was in 7th grade"  . 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 Biv  11/2010    ICD 12/11 MEDTRONIC  . Cataract extraction w/ intraocular lens  implant, bilateral  1990's  . Exploratory laparotomy w/ bowel resection  1993  . Tracheostomy  1993  . Coronary angioplasty  1988  . Coronary  angioplasty with stent placement  2003    "1"  . Cardiac catheterization  12/13/11  . Leff ventricular assist device (lvad)  05/02/12    LVAD implanted at [facility]    Current Outpatient Prescriptions  Medication Sig Dispense Refill  . acetaminophen (TYLENOL) 500 MG tablet Take 500 mg by mouth every 6 (six) hours as needed.      [name] aspirin 325 MG tablet Take 325 mg by mouth daily.      . carvedilol (COREG) 6.25 MG tablet Take 6.25 mg by mouth 2 (two) times daily with a meal.       . Cholecalciferol (VITAMIN D-3) 1000 UNITS CAPS Take 2,000 Units by mouth daily.      [name] doxycycline (VIBRAMYCIN) 100 MG capsule Take 100 mg by mouth 2 (two) times daily.      . furosemide (LASIX) 40 MG tablet Take 20 mg by mouth daily.      [name] gabapentin (NEURONTIN) 300 MG capsule Take 300 mg by mouth 3 (three) times daily.       [name] HYDROcodone-acetaminophen (NORCO/VICODIN) 5-325 MG per tablet Take 1 tablet by mouth every 6 (six) hours as needed for pain.      [name] LORazepam (ATIVAN) 1 MG tablet Take 0.5  mg by mouth At bedtime as needed.       [name] LYRICA 25 MG capsule Take 25 mg by mouth daily.      . Multiple Vitamin (MULTIVITAMIN) tablet Take 1 tablet by mouth daily.      . pravastatin (PRAVACHOL) 40 MG tablet TAKE ONE TABLET BY MOUTH AT BEDTIME. PATIENT NEEDS TO MAKE A APPOINTMENT IN THE [facility] OFFICE.  30 tablet  0  . ranitidine (ZANTAC) 150 MG capsule Take 150 mg by mouth 2 (two) times daily.       . simvastatin (ZOCOR) 40 MG tablet Take 40 mg by mouth every evening.      [name] ULORIC 40 MG tablet Take 40 mg by mouth daily.      [name] warfarin (COUMADIN) 5 MG tablet TAKE AS DIRECTED PER COUMADIN CLINIC  90 tablet  1   No current facility-administered medications for this visit.    Allergies  Allergen Reactions  . Levofloxacin Other (See Comments)    Doesn't tolerate  . Sulfonamide Derivatives Other (See Comments)    unknown  . Cefuroxime Rash  . Hydralazine Rash    History   Social History  . Marital Status: Married     Spouse Name: N/A    Number of Children: N/A  . Years of Education: N/A   Occupational History  . Not on file.   Social History Main Topics  . Smoking status: Former Smoker -- 1.00 packs/day for 30 years    Types: Cigarettes    Quit date: 12/03/1986  . Smokeless tobacco: Never Used  . Alcohol Use: No  . Drug Use: No  . Sexually Active: No   Other Topics Concern  . Not on file   Social History Narrative  . No narrative on file    Physical Exam: Filed Vitals:   02/25/13 1345  BP: 86/0  Pulse: 90  Height: 5\' 10"  (1.778 m)  Weight: 218 lb (98.884 kg)  SpO2: 99%    GEN- The patient is well appearing, alert and oriented x 3 today.  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  RV lead is programmed subthreshold by [facility] EP and the decision is to not replace his RV lead at this point.  He has developed return of diaphragmatic stim with LV pacing.  I have decreased his LV output today to 2.5V (from 3) at .   2. Chronic systolic dysfunction Clinically much improved s/p LVAD placement He will continue to follow closely at [facility]  3. Afib Stable Continue long term anticoagulation  He will return to see me in 3 months in [place]

## 2013-02-25 NOTE — Patient Instructions (Addendum)
Your physician recommends that you schedule a follow-up appointment in: 3 months with Dr Johney Frame in [facility] May appointment

## 2013-03-02 ENCOUNTER — Ambulatory Visit (INDEPENDENT_AMBULATORY_CARE_PROVIDER_SITE_OTHER): Payer: Medicare Other | Admitting: *Deleted

## 2013-03-02 ENCOUNTER — Other Ambulatory Visit: Payer: Self-pay | Admitting: Internal Medicine

## 2013-03-02 ENCOUNTER — Encounter: Payer: Self-pay | Admitting: Internal Medicine

## 2013-03-02 DIAGNOSIS — I259 Chronic ischemic heart disease, unspecified: Secondary | ICD-10-CM

## 2013-03-02 DIAGNOSIS — I469 Cardiac arrest, cause unspecified: Secondary | ICD-10-CM

## 2013-03-02 DIAGNOSIS — I5022 Chronic systolic (congestive) heart failure: Secondary | ICD-10-CM

## 2013-03-02 LAB — ICD DEVICE OBSERVATION
TZAT-0001ATACH: 2
TZAT-0001FASTVT: 1
TZAT-0002ATACH: NEGATIVE
TZAT-0002FASTVT: NEGATIVE
TZAT-0004SLOWVT: 8
TZAT-0005SLOWVT: 88 pct
TZAT-0012ATACH: 150 ms
TZAT-0012SLOWVT: 200 ms
TZAT-0013SLOWVT: 3
TZAT-0018FASTVT: NEGATIVE
TZAT-0019ATACH: 6 V
TZAT-0020ATACH: 1.5 ms
TZAT-0020ATACH: 1.5 ms
TZAT-0020ATACH: 1.5 ms
TZON-0003SLOWVT: 340 ms
TZON-0003VSLOWVT: 450 ms
TZON-0004SLOWVT: 32
TZON-0004VSLOWVT: 32
TZON-0005SLOWVT: 12
TZST-0001ATACH: 6
TZST-0001FASTVT: 2
TZST-0001FASTVT: 3
TZST-0001FASTVT: 4
TZST-0001FASTVT: 6
TZST-0001SLOWVT: 5
TZST-0002ATACH: NEGATIVE
TZST-0002FASTVT: NEGATIVE
TZST-0002FASTVT: NEGATIVE
TZST-0002FASTVT: NEGATIVE
TZST-0003SLOWVT: 35 J
TZST-0003SLOWVT: 35 J

## 2013-03-02 NOTE — Addendum Note (Signed)
Addended by: Forestine Chute on: 03/02/2013 02:56 PM   Modules accepted: Orders

## 2013-03-02 NOTE — Progress Notes (Signed)
ICD check 

## 2013-03-04 ENCOUNTER — Ambulatory Visit (INDEPENDENT_AMBULATORY_CARE_PROVIDER_SITE_OTHER): Payer: Medicare Other | Admitting: Internal Medicine

## 2013-03-04 ENCOUNTER — Encounter: Payer: Self-pay | Admitting: Internal Medicine

## 2013-03-04 VITALS — BP 88/0 | HR 86 | Wt 226.0 lb

## 2013-03-04 DIAGNOSIS — I4891 Unspecified atrial fibrillation: Secondary | ICD-10-CM

## 2013-03-04 DIAGNOSIS — I5023 Acute on chronic systolic (congestive) heart failure: Secondary | ICD-10-CM

## 2013-03-04 DIAGNOSIS — I469 Cardiac arrest, cause unspecified: Secondary | ICD-10-CM

## 2013-03-04 LAB — ICD DEVICE OBSERVATION
AL IMPEDENCE ICD: 456 Ohm
BATTERY VOLTAGE: 2.781 V
LV LEAD IMPEDENCE ICD: 779 Ohm
RV LEAD AMPLITUDE: 1.5 mv
RV LEAD THRESHOLD: 3.75 V
TOT-0002: 0
TOT-0006: 20111227000000
TZAT-0001ATACH: 1
TZAT-0001ATACH: 3
TZAT-0002ATACH: NEGATIVE
TZAT-0002ATACH: NEGATIVE
TZAT-0005SLOWVT: 88 pct
TZAT-0011SLOWVT: 10 ms
TZAT-0012ATACH: 150 ms
TZAT-0012ATACH: 150 ms
TZAT-0012FASTVT: 200 ms
TZAT-0018ATACH: NEGATIVE
TZAT-0018ATACH: NEGATIVE
TZAT-0019ATACH: 6 V
TZAT-0019FASTVT: 8 V
TZAT-0019SLOWVT: 8 V
TZAT-0020FASTVT: 1.5 ms
TZON-0003ATACH: 350 ms
TZST-0001ATACH: 4
TZST-0001ATACH: 5
TZST-0001ATACH: 6
TZST-0001FASTVT: 5
TZST-0001SLOWVT: 2
TZST-0001SLOWVT: 3
TZST-0001SLOWVT: 6
TZST-0002ATACH: NEGATIVE
TZST-0002ATACH: NEGATIVE
TZST-0002FASTVT: NEGATIVE
TZST-0002FASTVT: NEGATIVE
TZST-0003SLOWVT: 35 J
TZST-0003SLOWVT: 35 J
TZST-0003SLOWVT: 35 J

## 2013-03-04 NOTE — Patient Instructions (Addendum)
Your physician recommends that you schedule a follow-up appointment in: 1 month with Kristen in [place] and 3 months with Dr Johney Frame in [place]

## 2013-03-06 ENCOUNTER — Other Ambulatory Visit: Payer: Self-pay | Admitting: Physician Assistant

## 2013-03-09 ENCOUNTER — Encounter: Payer: Medicare Other | Admitting: Internal Medicine

## 2013-03-14 NOTE — Progress Notes (Signed)
PCP:  Ignatius Specking., MD Primary Cardiologist;  Dr Gala Romney, also followed by the [facility] transplant team  The patient presents today for electrophysiology followup.  He underwent LVAD placement at [facility] 5/13.  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 He was seen by Dr Wilford Grist at [facility]. Upon discussion, we decided that it was best to not revise his RV lead.  It was programmed subthershold.  His LV lead output was increased.  With this his diaphragmatic stim has returned.  He reports increased fatigue since increasing his coreg. I adjusted LV lead output last visit, but he continues to have diphragmatic stim.  Today, he denies symptoms of palpitations, chest pain, shortness of breath, orthopnea, PND, dizziness, presyncope, syncope, or neurologic sequela.  The patient feels that he is tolerating medications without difficulties and is otherwise without complaint today.   Past Medical History  Diagnosis Date  . Diabetes mellitus   . 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 CAD (coronary artery disease)   . Gout   . Hypotension     ORTHOSTATIC  . DVT of lower extremity (deep venous thrombosis)     left  . Hypertension   . High cholesterol   . Pulmonary embolism on left 1988  . Myocardial infarction 1988  . Angina   . CHF (congestive heart failure)   . Heart murmur   . Mitral valve disorder   . Pneumonia 1993; 2009    "both serious cases"  . Pneumonia 2010    "not hospitalized"  . Shortness of breath on exertion   . Sleep apnea   . Blood transfusion   . Anemia   . H/O hiatal hernia   . Stomach ulcer 1987-1988  . Jaundice     "@ birth & when I was in 7th grade"  . 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 Biv  11/2010    ICD 12/11 MEDTRONIC  . Cataract extraction w/ intraocular lens  implant, bilateral  1990's  . Exploratory laparotomy w/ bowel  resection  1993  . Tracheostomy  1993  . Coronary angioplasty  1988  . Coronary angioplasty with stent placement  2003    "1"  . Cardiac catheterization  12/13/11  . Leff ventricular assist device (lvad)  05/02/12    LVAD implanted at [facility]    Current Outpatient Prescriptions  Medication Sig Dispense Refill  . acetaminophen (TYLENOL) 500 MG tablet Take 500 mg by mouth every 6 (six) hours as needed.      [name] aspirin 325 MG tablet Take 325 mg by mouth daily.      . carvedilol (COREG) 25 MG tablet Take 12.5 mg by mouth 2 (two) times daily with a meal.      . Cholecalciferol (VITAMIN D-3) 1000 UNITS CAPS Take 2,000 Units by mouth daily.      . furosemide (LASIX) 40 MG tablet Take 40mg  in the am and 20mg  in the pm  30 tablet    . HYDROcodone-acetaminophen (NORCO/VICODIN) 5-325 MG per tablet Take 1 tablet by mouth every 6 (six) hours as needed for pain.      [name] LORazepam (ATIVAN) 1 MG tablet Take 0.5 mg by mouth At bedtime as needed.       [name] LYRICA 25 MG capsule Take 25 mg by mouth 3 (  three) times daily.       . Multiple Vitamin (MULTIVITAMIN) tablet Take 1 tablet by mouth daily.      . ranitidine (ZANTAC) 150 MG capsule Take 150 mg by mouth 2 (two) times daily.       . simvastatin (ZOCOR) 40 MG tablet Take 40 mg by mouth every evening.      [name] ULORIC 40 MG tablet Take 40 mg by mouth daily.      [name] warfarin (COUMADIN) 5 MG tablet TAKE AS DIRECTED PER COUMADIN CLINIC  90 tablet  1  . pravastatin (PRAVACHOL) 40 MG tablet TAKE ONE TABLET BY MOUTH AT BEDTIME, PATIENT NEEDS OFFICE VISIT  30 tablet  0   No current facility-administered medications for this visit.    Allergies  Allergen Reactions  . Levofloxacin Other (See Comments)    Doesn't tolerate  . Sulfonamide Derivatives Other (See Comments)    unknown  . Cefuroxime Rash  . Hydralazine Rash    History   Social History  . Marital Status: Married    Spouse Name: N/A    Number of Children: N/A  . Years of Education: N/A   Occupational  History  . Not on file.   Social History Main Topics  . Smoking status: Former Smoker -- 1.00 packs/day for 30 years    Types: Cigarettes    Quit date: 12/03/1986  . Smokeless tobacco: Never Used  . Alcohol Use: No  . Drug Use: No  . Sexually Active: No   Other Topics Concern  . Not on file   Social History Narrative  . No narrative on file    Physical Exam: Filed Vitals:   03/04/13 0855  BP: 88/0  Pulse: 86  Weight: 226 lb (102.513 kg)    GEN- The patient is well appearing, alert and oriented x 3 today.  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  RV lead is programmed subthreshold by [facility] EP.  Given clinical decline with this, I will turn RV pacing back on today.  He may require RV lead revision if he improves with RV and LV pacing.  He has developed return of diaphragmatic stim with LV pacing.  I have decreased his LV output today to 2.0V (from 2.5) at .   2. Chronic systolic dysfunction Clinically much improved s/p LVAD placement  RV lead turned back to threshold pacing today.  We will follow this closely. He will continue to follow closely at [facility]  3. Afib Stable Continue long term anticoagulation  He will return to see me in 2 months in [place]

## 2013-03-23 ENCOUNTER — Encounter: Payer: Self-pay | Admitting: *Deleted

## 2013-03-30 ENCOUNTER — Encounter: Payer: Self-pay | Admitting: Internal Medicine

## 2013-03-30 ENCOUNTER — Other Ambulatory Visit: Payer: Self-pay | Admitting: Internal Medicine

## 2013-03-30 ENCOUNTER — Ambulatory Visit (INDEPENDENT_AMBULATORY_CARE_PROVIDER_SITE_OTHER): Payer: Medicare Other | Admitting: *Deleted

## 2013-03-30 DIAGNOSIS — I4891 Unspecified atrial fibrillation: Secondary | ICD-10-CM

## 2013-03-30 DIAGNOSIS — I469 Cardiac arrest, cause unspecified: Secondary | ICD-10-CM

## 2013-03-30 DIAGNOSIS — I5023 Acute on chronic systolic (congestive) heart failure: Secondary | ICD-10-CM

## 2013-03-30 LAB — ICD DEVICE OBSERVATION
AL AMPLITUDE: 0.3 mv
ATRIAL PACING ICD: 0 pct
BRDY-0002RV: 80 {beats}/min
CHARGE TIME: 10.66 s
FVT: 0
LV LEAD IMPEDENCE ICD: 760 Ohm
RV LEAD IMPEDENCE ICD: 399 Ohm
TOT-0001: 0
TZAT-0001ATACH: 2
TZAT-0001FASTVT: 1
TZAT-0002ATACH: NEGATIVE
TZAT-0002FASTVT: NEGATIVE
TZAT-0004SLOWVT: 8
TZAT-0012ATACH: 150 ms
TZAT-0012SLOWVT: 200 ms
TZAT-0013SLOWVT: 3
TZAT-0018ATACH: NEGATIVE
TZAT-0019ATACH: 6 V
TZAT-0019ATACH: 6 V
TZAT-0020ATACH: 1.5 ms
TZAT-0020ATACH: 1.5 ms
TZAT-0020SLOWVT: 1.5 ms
TZON-0003SLOWVT: 340 ms
TZON-0003VSLOWVT: 450 ms
TZON-0004SLOWVT: 48
TZON-0005SLOWVT: 12
TZST-0001FASTVT: 2
TZST-0001FASTVT: 3
TZST-0001FASTVT: 4
TZST-0001FASTVT: 6
TZST-0001SLOWVT: 4
TZST-0001SLOWVT: 5
TZST-0002ATACH: NEGATIVE
TZST-0002FASTVT: NEGATIVE
TZST-0002FASTVT: NEGATIVE
TZST-0002FASTVT: NEGATIVE
TZST-0003SLOWVT: 35 J
TZST-0003SLOWVT: 35 J
VENTRICULAR PACING ICD: 96.63 pct
VF: 0

## 2013-03-30 NOTE — Progress Notes (Signed)
ICD check in clinic. Pt had follow up with [facility] x 2 weeks ago. LV lead was turned off and RV only pacing with output 4.00 @ 1.00. Today's check RV threshold 3.00 @ 1.00. No changes were made. Pt feels some better with changes made at [facility]. Optivol stable at this time. Pt is concerned about having generator change. Battery voltage 2.74 V. Pt aware of alert and will call office if heard. ROV 05-27-13 @ 930 with JA in [place].

## 2013-04-12 ENCOUNTER — Other Ambulatory Visit: Payer: Self-pay | Admitting: Physician Assistant

## 2013-04-23 ENCOUNTER — Encounter: Payer: Medicare Other | Admitting: Internal Medicine

## 2013-05-27 ENCOUNTER — Ambulatory Visit (INDEPENDENT_AMBULATORY_CARE_PROVIDER_SITE_OTHER): Payer: Medicare Other | Admitting: Internal Medicine

## 2013-05-27 ENCOUNTER — Encounter: Payer: Self-pay | Admitting: Internal Medicine

## 2013-05-27 VITALS — BP 120/70 | HR 83 | Ht 70.5 in | Wt 230.0 lb

## 2013-05-27 DIAGNOSIS — I469 Cardiac arrest, cause unspecified: Secondary | ICD-10-CM

## 2013-05-27 DIAGNOSIS — I4891 Unspecified atrial fibrillation: Secondary | ICD-10-CM

## 2013-05-27 DIAGNOSIS — I5023 Acute on chronic systolic (congestive) heart failure: Secondary | ICD-10-CM

## 2013-05-27 DIAGNOSIS — I5022 Chronic systolic (congestive) heart failure: Secondary | ICD-10-CM

## 2013-05-27 LAB — ICD DEVICE OBSERVATION
AL AMPLITUDE: 0.5 mv
AL IMPEDENCE ICD: 418 Ohm
BMOD-0005RV: 100 {beats}/min
CHARGE TIME: 10.66 s
LV LEAD IMPEDENCE ICD: 722 Ohm
RV LEAD AMPLITUDE: 1.375 mv
RV LEAD IMPEDENCE ICD: 361 Ohm
TOT-0001: 0
TOT-0002: 0
TOT-0006: 20111227000000
TZAT-0001ATACH: 2
TZAT-0001ATACH: 3
TZAT-0002ATACH: NEGATIVE
TZAT-0004SLOWVT: 8
TZAT-0005SLOWVT: 88 pct
TZAT-0011SLOWVT: 10 ms
TZAT-0012ATACH: 150 ms
TZAT-0012ATACH: 150 ms
TZAT-0012ATACH: 150 ms
TZAT-0018ATACH: NEGATIVE
TZAT-0018ATACH: NEGATIVE
TZAT-0019FASTVT: 8 V
TZAT-0020ATACH: 1.5 ms
TZAT-0020ATACH: 1.5 ms
TZAT-0020FASTVT: 1.5 ms
TZON-0003VSLOWVT: 450 ms
TZON-0004VSLOWVT: 32
TZON-0005SLOWVT: 12
TZST-0001ATACH: 4
TZST-0001ATACH: 6
TZST-0001FASTVT: 3
TZST-0001FASTVT: 5
TZST-0001FASTVT: 6
TZST-0001SLOWVT: 3
TZST-0001SLOWVT: 5
TZST-0002ATACH: NEGATIVE
TZST-0002ATACH: NEGATIVE
TZST-0002ATACH: NEGATIVE
TZST-0002FASTVT: NEGATIVE
TZST-0002FASTVT: NEGATIVE
TZST-0003SLOWVT: 35 J
TZST-0003SLOWVT: 35 J
TZST-0003SLOWVT: 35 J

## 2013-05-27 NOTE — Progress Notes (Signed)
PCP:  Ignatius Specking., MD Primary Cardiologist;  Dr Gala Romney, also followed by the [facility] transplant team  The patient presents today for electrophysiology followup. He does much better with biv pacing, though his RV threshold is chronically elevated and he has diaphragmatic stim with LV pacing at times.  Today, he denies symptoms of palpitations, chest pain, shortness of breath, orthopnea, PND, dizziness, presyncope, syncope, or neurologic sequela.  The patient feels that he is tolerating medications without difficulties and is otherwise without complaint today.   Past Medical History  Diagnosis Date  . Diabetes mellitus   . 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 CAD (coronary artery disease)   . Gout   . Hypotension     ORTHOSTATIC  . DVT of lower extremity (deep venous thrombosis)     left  . Hypertension   . High cholesterol   . Pulmonary embolism on left 1988  . Myocardial infarction 1988  . Angina   . CHF (congestive heart failure)   . Heart murmur   . Mitral valve disorder   . Pneumonia 1993; 2009    "both serious cases"  . Pneumonia 2010    "not hospitalized"  . Shortness of breath on exertion   . Sleep apnea   . Blood transfusion   . Anemia   . H/O hiatal hernia   . Stomach ulcer 1987-1988  . Jaundice     "@ birth & when I was in 7th grade"  . 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 Biv  11/2010    ICD 12/11 MEDTRONIC  . Cataract extraction w/ intraocular lens  implant, bilateral  1990's  . Exploratory laparotomy w/ bowel resection  1993  . Tracheostomy  1993  . Coronary angioplasty  1988  . Coronary angioplasty with stent placement  2003    "1"  . Cardiac catheterization  12/13/11  . Leff ventricular assist device (lvad)  05/02/12    LVAD implanted at [facility]    Current Outpatient Prescriptions  Medication Sig Dispense Refill  . acetaminophen (TYLENOL) 500  MG tablet Take 500 mg by mouth every 6 (six) hours as needed.      [name] aspirin 325 MG tablet Take 325 mg by mouth daily.      . carvedilol (COREG) 25 MG tablet Take 12.5 mg by mouth 2 (two) times daily with a meal.      . Cholecalciferol (VITAMIN D-3) 1000 UNITS CAPS Take 2,000 Units by mouth daily.      . furosemide (LASIX) 40 MG tablet Take 60 mg by mouth daily.      [name] HYDROcodone-acetaminophen (NORCO/VICODIN) 5-325 MG per tablet Take 1 tablet by mouth every 6 (six) hours as needed for pain.      [name] LORazepam (ATIVAN) 1 MG tablet Take 0.5 mg by mouth At bedtime as needed.       [name] LYRICA 25 MG capsule Take 25-50 mg by mouth 3 (three) times daily. Take 1 capsule in AM, 1 capsule mid-day, and 2 capsules at bedtime      . Multiple Vitamin (MULTIVITAMIN) tablet Take 1 tablet by mouth daily.      . pravastatin (PRAVACHOL) 40 MG tablet TAKE ONE TABLET BY MOUTH AT BEDTIME, PATIENT NEEDS OFFICE VISIT  30 tablet  0  . ranitidine (ZANTAC) 150 MG capsule Take 150 mg by mouth 2 (two)  times daily.       [name] ULORIC 40 MG tablet Take 40 mg by mouth daily.      [name] warfarin (COUMADIN) 2 MG tablet Take 4 mg by mouth daily.       No current facility-administered medications for this visit.    Allergies  Allergen Reactions  . Levofloxacin Other (See Comments)    Doesn't tolerate  . Sulfonamide Derivatives Other (See Comments)    unknown  . Cefuroxime Rash  . Hydralazine Rash    History   Social History  . Marital Status: Married    Spouse Name: N/A    Number of Children: N/A  . Years of Education: N/A   Occupational History  . Not on file.   Social History Main Topics  . Smoking status: Former Smoker -- 1.00 packs/day for 30 years    Types: Cigarettes    Quit date: 12/03/1986  . Smokeless tobacco: Never Used  . Alcohol Use: No  . Drug Use: No  . Sexually Active: No   Other Topics Concern  . Not on file   Social History Narrative  . No narrative on file    Physical Exam: Filed Vitals:    05/27/13 0928  BP: 120/70  Pulse: 83  Height: 5' 10.5" (1.791 m)  Weight: 230 lb (104.327 kg)  SpO2: 97%    GEN- The patient is well appearing, alert and oriented x 3 today.  He has an LVAD Head- normocephalic, atraumatic Eyes-  Sclera clear, conjunctiva pink Ears- hearing intact Oropharynx- clear Neck- supple,  Lungs- Clear to ausculation bilaterally, normal work of breathing Chest- ICD pocket is well healed Heart- LVAD hum is prominent GI- soft, NT, ND, + BS Extremities- no clubbing, cyanosis, or edema MS- diffuse muscle atrophy Skin- no rash or lesion Psych- euthymic mood, full affect Neuro- strength and sensation are intact  ICD interrogation- reviewed in detail today,  See PACEART report  Assessment and Plan:  1. BIV ICD lead issue Followed also by Dr Wilford Grist at [facility]. Continue with current programming (Dr Wilford Grist note from [facility] reviewed). Will plan generator change with possible lead revision at [facility] once ERI.  Carelink monthly.  I will see again in 3 months.     2. Chronic systolic dysfunction Clinically much improved s/p LVAD placement  He will continue to follow closely at [facility]  3. Afib Stable Continue long term anticoagulation  He will return to see me in 3 months in [place]

## 2013-05-27 NOTE — Patient Instructions (Addendum)
   June 29, 2013 you will have your carlink/home device check. Your physician recommends that you schedule a follow-up appointment in: 3 months with Dr. Johney Frame. Your physician recommends that you continue on your current medications as directed. Please refer to the Current Medication list given to you today.

## 2013-06-29 ENCOUNTER — Ambulatory Visit (INDEPENDENT_AMBULATORY_CARE_PROVIDER_SITE_OTHER): Payer: Medicare Other | Admitting: *Deleted

## 2013-06-29 DIAGNOSIS — I428 Other cardiomyopathies: Secondary | ICD-10-CM

## 2013-07-07 ENCOUNTER — Telehealth: Payer: Self-pay | Admitting: *Deleted

## 2013-07-07 LAB — REMOTE ICD DEVICE
AL IMPEDENCE ICD: 418 Ohm
ATRIAL PACING ICD: 0 pct
BMOD-0005RV: 100 {beats}/min
CHARGE TIME: 12.071 s
LV LEAD IMPEDENCE ICD: 703 Ohm
PACEART VT: 0
TOT-0001: 0
TOT-0002: 0
TOT-0006: 20111227000000
TZAT-0001ATACH: 2
TZAT-0001ATACH: 3
TZAT-0002ATACH: NEGATIVE
TZAT-0005SLOWVT: 88 pct
TZAT-0011SLOWVT: 10 ms
TZAT-0012ATACH: 150 ms
TZAT-0012ATACH: 150 ms
TZAT-0012SLOWVT: 200 ms
TZAT-0018ATACH: NEGATIVE
TZAT-0018ATACH: NEGATIVE
TZAT-0018ATACH: NEGATIVE
TZAT-0019ATACH: 6 V
TZAT-0019FASTVT: 8 V
TZAT-0020ATACH: 1.5 ms
TZAT-0020FASTVT: 1.5 ms
TZON-0003SLOWVT: 340 ms
TZON-0003VSLOWVT: 450 ms
TZON-0004VSLOWVT: 32
TZON-0005SLOWVT: 12
TZST-0001ATACH: 6
TZST-0001FASTVT: 2
TZST-0001FASTVT: 3
TZST-0001FASTVT: 5
TZST-0001SLOWVT: 3
TZST-0001SLOWVT: 5
TZST-0002ATACH: NEGATIVE
TZST-0002ATACH: NEGATIVE
TZST-0002FASTVT: NEGATIVE
TZST-0002FASTVT: NEGATIVE
TZST-0003SLOWVT: 35 J
TZST-0003SLOWVT: 35 J
VENTRICULAR PACING ICD: 97.19 pct

## 2013-07-07 NOTE — Telephone Encounter (Signed)
LMOM for return call from pt or wife in regards to remote transmission received on 06-29-13. Device battery voltage 2.62 V. Per last JA office note gen change and lead revision to be scheduled at [facility]. Will wait to hear from pt.

## 2013-07-13 NOTE — Addendum Note (Signed)
Addended by: Marily Lente on: 07/13/2013 08:28 AM   Modules accepted: Level of Service

## 2013-07-14 DIAGNOSIS — K922 Gastrointestinal hemorrhage, unspecified: Secondary | ICD-10-CM | POA: Insufficient documentation

## 2013-07-20 NOTE — Telephone Encounter (Signed)
Spoke w/pt and pt aware device is at [facility]. Pt has appointment at [facility] end of August and will let know.

## 2013-08-19 HISTORY — PX: IMPLANTABLE CARDIOVERTER DEFIBRILLATOR GENERATOR CHANGE: SHX5859

## 2013-09-04 ENCOUNTER — Telehealth: Payer: Self-pay | Admitting: Internal Medicine

## 2013-09-04 NOTE — Telephone Encounter (Signed)
N/A  Will call pt on 09-06-13/kwm

## 2013-09-04 NOTE — Telephone Encounter (Signed)
Patient requesting that Baxter Hire call him about new device that was placed at [facility]

## 2013-09-08 ENCOUNTER — Encounter: Payer: Self-pay | Admitting: Internal Medicine

## 2013-09-08 NOTE — Telephone Encounter (Signed)
Spoke w/pt about device replacement and pt scheduled for JA office visit in December/kwm

## 2013-11-12 ENCOUNTER — Encounter: Payer: Self-pay | Admitting: Internal Medicine

## 2013-11-12 ENCOUNTER — Ambulatory Visit (INDEPENDENT_AMBULATORY_CARE_PROVIDER_SITE_OTHER): Payer: Medicare Other | Admitting: Internal Medicine

## 2013-11-12 VITALS — BP 106/84 | HR 81 | Ht 70.5 in | Wt 227.0 lb

## 2013-11-12 DIAGNOSIS — I5022 Chronic systolic (congestive) heart failure: Secondary | ICD-10-CM

## 2013-11-12 DIAGNOSIS — I5023 Acute on chronic systolic (congestive) heart failure: Secondary | ICD-10-CM

## 2013-11-12 DIAGNOSIS — I4891 Unspecified atrial fibrillation: Secondary | ICD-10-CM

## 2013-11-12 LAB — MDC_IDC_ENUM_SESS_TYPE_INCLINIC
Lead Channel Pacing Threshold Amplitude: 1.75 V
Lead Channel Setting Pacing Pulse Width: 1 ms
Lead Channel Setting Sensing Sensitivity: 0.3 mV
Zone Setting Detection Interval: 290 ms
Zone Setting Detection Interval: 340 ms
Zone Setting Detection Interval: 350 ms

## 2013-11-12 NOTE — Progress Notes (Signed)
PCP:  Ignatius Specking., MD Primary Cardiologist;  Dr Gala Romney, also followed by the [facility] transplant team  The patient presents today for electrophysiology followup. Since his ICD generator change at [facility] 9/14, he has done reasonably well.  He did not have lead revision, though his RV threshold is chronically elevated and he has diaphragmatic stim with LV pacing.  His LV lead was turned off at [facility].  He has had some troubles with anemia for which he has been evaluated at [facility].  His energy is slowly improving.  Today, he denies symptoms of palpitations, chest pain, shortness of breath, orthopnea, PND, dizziness, presyncope, syncope, or neurologic sequela.  The patient feels that he is tolerating medications without difficulties and is otherwise without complaint today.   Past Medical History  Diagnosis Date  . Diabetes mellitus   . 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 CAD (coronary artery disease)   . Gout   . Hypotension     ORTHOSTATIC  . DVT of lower extremity (deep venous thrombosis)     left  . Hypertension   . High cholesterol   . Pulmonary embolism on left 1988  . Myocardial infarction 1988  . Angina   . CHF (congestive heart failure)   . Heart murmur   . Mitral valve disorder   . Pneumonia 1993; 2009    "both serious cases"  . Pneumonia 2010    "not hospitalized"  . Shortness of breath on exertion   . Sleep apnea   . Blood transfusion   . Anemia   . H/O hiatal hernia   . Stomach ulcer 1987-1988  . Jaundice     "@ birth & when I was in 7th grade"  . 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 Biv  11/2010    ICD 12/11 MEDTRONIC  . Cataract extraction w/ intraocular lens  implant, bilateral  1990's  . Exploratory laparotomy w/ bowel resection  1993  . Tracheostomy  1993  . Coronary angioplasty  1988  . Coronary angioplasty with stent placement  2003    "1"  .  Cardiac catheterization  12/13/11  . Leff ventricular assist device (lvad)  05/02/12    LVAD implanted at [facility]  . Implantable cardioverter defibrillator generator change  08/19/13    MDT Coletta Memos CRT-D generator change by Dr Wilford Grist at [facility].  RV lead with chronically elevated threshold and low R waves not revised.  LV lead with chronic diaphrgramtic stim turned off at [facility].    Current Outpatient Prescriptions  Medication Sig Dispense Refill  . acetaminophen (TYLENOL) 500 MG tablet Take 500 mg by mouth every 6 (six) hours as needed.      . carvedilol (COREG) 3.125 MG tablet Take 1 tablet by mouth 2 (two) times daily.      . ferrous sulfate 324 (65 FE) MG TBEC Take 1 tablet by mouth daily.      . furosemide (LASIX) 40 MG tablet Take 60 mg by mouth daily.      [name] gabapentin (NEURONTIN) 300 MG capsule Take 1 capsule by mouth 3 (three) times daily.      [name] LORazepam (ATIVAN) 0.5 MG tablet Take 0.5 mg by mouth every 8 (eight) hours.      . Multiple Vitamin (MULTIVITAMIN) tablet Take 1 tablet by mouth daily.      [name] omeprazole (PRILOSEC)  40 MG capsule Take 40 mg by mouth 2 (two) times daily.      . pravastatin (PRAVACHOL) 40 MG tablet Take one by mouth daily      . temazepam (RESTORIL) 15 MG capsule Take 1 capsule by mouth at bedtime as needed.      [name] ULORIC 40 MG tablet Take 40 mg by mouth daily.      [name] warfarin (COUMADIN) 2 MG tablet Take 2-4 mg by mouth as directed. Managed by [facility] Patient checks at home       No current facility-administered medications for this visit.    Allergies  Allergen Reactions  . Levofloxacin Other (See Comments)    Doesn't tolerate  . Sulfonamide Derivatives Other (See Comments)    unknown  . Cefuroxime Rash  . Hydralazine Rash    History   Social History  . Marital Status: Married    Spouse Name: N/A    Number of Children: N/A  . Years of Education: N/A   Occupational History  . Not on file.   Social History Main Topics  . Smoking status: Former Smoker  -- 1.00 packs/day for 30 years    Types: Cigarettes    Quit date: 12/03/1986  . Smokeless tobacco: Never Used  . Alcohol Use: No  . Drug Use: No  . Sexual Activity: No   Other Topics Concern  . Not on file   Social History Narrative  . No narrative on file    Physical Exam: Filed Vitals:   11/12/13 1119  BP: 106/84  Pulse: 81  Height: 5' 10.5" (1.791 m)  Weight: 227 lb (102.967 kg)  SpO2: 97%    GEN- The patient is well appearing, alert and oriented x 3 today.  He has an LVAD Head- normocephalic, atraumatic Eyes-  Sclera clear, conjunctiva pink Ears- hearing intact Oropharynx- clear Neck- supple,  Lungs- Clear to ausculation bilaterally, normal work of breathing Chest- ICD pocket is well healed Heart- LVAD hum is prominent GI- soft, NT, ND, + BS Extremities- no clubbing, cyanosis, or edema  ICD interrogation- reviewed in detail today,  See PACEART report  Assessment and Plan:  1. BIV ICD lead issue Pocket is healed s/p BiV Gen change.  LV lead was turned off at [facility] for diaphragmatic stim.  His RV lead threshold is chronically elevated and R waves are low but was not revised at [facility].  I will request the operative note and followup visit summary. Carelink monthly.  I will see again in 9 months.    2. Chronic systolic dysfunction Clinically much improved s/p LVAD placement  He will continue to follow closely at [facility]  3. Afib permanent Continue long term anticoagulation  He will return to see me in 9 months in [facility]

## 2013-11-12 NOTE — Patient Instructions (Signed)
Your physician recommends that you schedule a follow-up appointment in: September 2015 with Dr. Johney Frame. You should receive a letter in the mail in July 2015. If you do not receive this letter by July 2015 call our office to schedule this appointment.   Home transmission due in March 2015  Your physician recommends that you continue on your current medications as directed. Please refer to the Current Medication list given to you today.

## 2013-11-15 ENCOUNTER — Encounter: Payer: Self-pay | Admitting: Cardiovascular Disease

## 2014-02-15 ENCOUNTER — Encounter: Payer: Medicare Other | Admitting: *Deleted

## 2014-02-22 ENCOUNTER — Encounter: Payer: Self-pay | Admitting: *Deleted

## 2014-03-05 ENCOUNTER — Ambulatory Visit (INDEPENDENT_AMBULATORY_CARE_PROVIDER_SITE_OTHER): Payer: Medicare Other | Admitting: *Deleted

## 2014-03-05 DIAGNOSIS — I5022 Chronic systolic (congestive) heart failure: Secondary | ICD-10-CM

## 2014-03-05 DIAGNOSIS — I4891 Unspecified atrial fibrillation: Secondary | ICD-10-CM

## 2014-03-05 DIAGNOSIS — I259 Chronic ischemic heart disease, unspecified: Secondary | ICD-10-CM

## 2014-03-09 LAB — MDC_IDC_ENUM_SESS_TYPE_REMOTE
Battery Remaining Longevity: 53 mo
Brady Statistic AP VP Percent: 0 %
Brady Statistic AS VS Percent: 12.16 %
Brady Statistic RA Percent Paced: 0 %
Brady Statistic RV Percent Paced: 89.22 %
Date Time Interrogation Session: 20150403145419
HIGH POWER IMPEDANCE MEASURED VALUE: 43 Ohm
HighPow Impedance: 171 Ohm
HighPow Impedance: 35 Ohm
Lead Channel Impedance Value: 342 Ohm
Lead Channel Impedance Value: 437 Ohm
Lead Channel Sensing Intrinsic Amplitude: 0.375 mV
Lead Channel Sensing Intrinsic Amplitude: 4.25 mV
MDC IDC MSMT BATTERY VOLTAGE: 2.95 V
MDC IDC MSMT LEADCHNL LV IMPEDANCE VALUE: 266 Ohm
MDC IDC MSMT LEADCHNL LV IMPEDANCE VALUE: 589 Ohm
MDC IDC MSMT LEADCHNL LV PACING THRESHOLD AMPLITUDE: 1.875 V
MDC IDC MSMT LEADCHNL LV PACING THRESHOLD PULSEWIDTH: 0.4 ms
MDC IDC MSMT LEADCHNL RA IMPEDANCE VALUE: 456 Ohm
MDC IDC SET LEADCHNL RV PACING AMPLITUDE: 3.5 V
MDC IDC SET LEADCHNL RV PACING PULSEWIDTH: 1 ms
MDC IDC SET LEADCHNL RV SENSING SENSITIVITY: 0.3 mV
MDC IDC SET ZONE DETECTION INTERVAL: 350 ms
MDC IDC SET ZONE DETECTION INTERVAL: 450 ms
MDC IDC STAT BRADY AP VS PERCENT: 0 %
MDC IDC STAT BRADY AS VP PERCENT: 87.84 %
Zone Setting Detection Interval: 290 ms
Zone Setting Detection Interval: 340 ms

## 2014-03-10 ENCOUNTER — Encounter: Payer: Self-pay | Admitting: Cardiovascular Disease

## 2014-03-10 ENCOUNTER — Ambulatory Visit (INDEPENDENT_AMBULATORY_CARE_PROVIDER_SITE_OTHER): Payer: Medicare Other | Admitting: Cardiovascular Disease

## 2014-03-10 VITALS — BP 109/78 | HR 86 | Ht 70.25 in | Wt 219.0 lb

## 2014-03-10 DIAGNOSIS — I4891 Unspecified atrial fibrillation: Secondary | ICD-10-CM

## 2014-03-10 DIAGNOSIS — I2510 Atherosclerotic heart disease of native coronary artery without angina pectoris: Secondary | ICD-10-CM

## 2014-03-10 DIAGNOSIS — Z86718 Personal history of other venous thrombosis and embolism: Secondary | ICD-10-CM

## 2014-03-10 DIAGNOSIS — R42 Dizziness and giddiness: Secondary | ICD-10-CM

## 2014-03-10 DIAGNOSIS — I5022 Chronic systolic (congestive) heart failure: Secondary | ICD-10-CM

## 2014-03-10 DIAGNOSIS — I519 Heart disease, unspecified: Secondary | ICD-10-CM

## 2014-03-10 DIAGNOSIS — Z95811 Presence of heart assist device: Secondary | ICD-10-CM

## 2014-03-10 DIAGNOSIS — I428 Other cardiomyopathies: Secondary | ICD-10-CM

## 2014-03-10 DIAGNOSIS — R0602 Shortness of breath: Secondary | ICD-10-CM

## 2014-03-10 DIAGNOSIS — I259 Chronic ischemic heart disease, unspecified: Secondary | ICD-10-CM

## 2014-03-10 DIAGNOSIS — Z7901 Long term (current) use of anticoagulants: Secondary | ICD-10-CM

## 2014-03-10 NOTE — Patient Instructions (Signed)
   Chest x-ray  Office will contact with results via phone or letter.   Continue all current medications. Referral to Dr. Nicholes Mango - [address] Enroll into our coumadin clinic with Misty Stanley  Follow up after Bensimhon appointment

## 2014-03-10 NOTE — Progress Notes (Signed)
Patient ID: Annamaria BootsBenny Wilson [place], male   DOB: Jan 20, 1941, 73 y.o.   MRN: [id number]      SUBJECTIVE: The patient is a 73 year old male who I am evaluating for the first time. He reportedly has a history of coronary artery disease with prior percutaneous coronary intervention, ischemic cardiomyopathy, chronic systolic heart failure and has an LVAD which [facility] (HeartMate II implanted in 04/2012, most recently evaluated on 01/13/14 by Dr. Versie StarksJoseph [place]), atrial fibrillation, CVA, hypertension, hyperlipidemia, CKD, NSVT, anemia, and DVT/PE. The most recent echocardiogram I find was performed on 09/14/2013 which revealed severe left ventricular dysfunction, mild right ventricular dysfunction, mild LVH, mild aortic and tricuspid regurgitation, and trivial mitral regurgitation. He spoke with a nurse on April 7 complaining of dizziness and breathlessness with exertion. ICD was evaluated on April 7 and was found to be functioning normally. He is in atrial fibrillation 100% of the time. No ventricular arrhythmias were noted. He is on Lasix 60 mg daily and is on warfarin for anticoagulation. At [facility], it was decided not to start carvedilol. ECG performed on 03/09/2014 showed a paced rhythm. INR was subtherapeutic at 1.4. White count was normal at 7.1. Hemoglobin was slightly low at 11.7. Platelets were normal at 209.  He has been experiencing episodic dizziness and dyspnea with exertion. He denies syncope. He had dizziness and a fall in 11/2013 which led to an arm fracture. He denies leg swelling and does wear compression stockings. He denies chest discomfort. He has felt somewhat fatigued recently. He has had a lot of difficulty driving to [facility] for his appointments and he and his wife have requested to be seen closer to their [place], and would like to see [name].     Allergies  Allergen Reactions  . Levofloxacin Other (See Comments)    Doesn't tolerate  .  Sulfonamide Derivatives Other (See Comments)    unknown  . Cefuroxime Rash  . Hydralazine Rash    Current Outpatient Prescriptions  Medication Sig Dispense Refill  . acetaminophen (TYLENOL) 500 MG tablet Take 500 mg by mouth every 6 (six) hours as needed.      . Cholecalciferol (VITAMIN D3) 2000 UNITS TABS Take 1 tablet by mouth daily.      . ferrous sulfate 324 (65 FE) MG TBEC Take 1 tablet by mouth daily.      . furosemide (LASIX) 40 MG tablet Take 60 mg by mouth daily.      [name]. gabapentin (NEURONTIN) 300 MG capsule Take 1 capsule by mouth 3 (three) times daily.      . Multiple Vitamin (MULTIVITAMIN) tablet Take 1 tablet by mouth daily.      [name]. omeprazole (PRILOSEC) 40 MG capsule Take 40 mg by mouth 2 (two) times daily.      . pravastatin (PRAVACHOL) 40 MG tablet Take one by mouth daily      . temazepam (RESTORIL) 15 MG capsule Take 1 capsule by mouth at bedtime as needed.      [name]. ULORIC 40 MG tablet Take 40 mg by mouth daily.      [name]. warfarin (COUMADIN) 2 MG tablet Take 2-4 mg by mouth as directed. Managed by [facility] Patient checks at [place]       No current facility-administered medications for this visit.    Past Medical History  Diagnosis Date  . Diabetes mellitus   . 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 CAD (coronary artery  disease)   . Gout   . Hypotension     ORTHOSTATIC  . DVT of lower extremity (deep venous thrombosis)     left  . Hypertension   . High cholesterol   . Pulmonary embolism on left 1988  . Myocardial infarction 1988  . Angina   . CHF (congestive heart failure)   . Heart murmur   . Mitral valve disorder   . Pneumonia 1993; 2009    "both serious cases"  . Pneumonia 2010    "not hospitalized"  . Shortness of breath on exertion   . Sleep apnea   . Blood transfusion   . Anemia   . H/O hiatal hernia   . Stomach ulcer 1987-1988  . Jaundice     "@ birth & when I was in 7th grade"  . 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 Biv  11/2010    ICD 12/11 MEDTRONIC  . Cataract extraction w/ intraocular lens  implant, bilateral  1990's  . Exploratory laparotomy w/ bowel resection  1993  . Tracheostomy  1993  . Coronary angioplasty  1988  . Coronary angioplasty with stent placement  2003    "1"  . Cardiac catheterization  12/13/11  . Leff ventricular assist device (lvad)  05/02/12    LVAD implanted at [facility]  . Implantable cardioverter defibrillator generator change  08/19/13    MDT Coletta Memos CRT-D generator change by Dr Wilford Grist at [facility].  RV lead with chronically elevated threshold and low R waves not revised.  LV lead with chronic diaphrgramtic stim turned off at [facility].    History   Social History  . Marital Status: Married    Spouse Name: N/A    Number of Children: N/A  . Years of Education: N/A   Occupational History  . Not on file.   Social History Main Topics  . Smoking status: Former Smoker -- 1.00 packs/day for 30 years    Types: Cigarettes    Quit date: 12/03/1986  . Smokeless tobacco: Never Used  . Alcohol Use: No  . Drug Use: No  . Sexual Activity: No   Other Topics Concern  . Not on file   Social History Narrative  . No narrative on file     Filed Vitals:   03/10/14 0959  BP: 109/78  Pulse: 86  Height: 5' 10.25" (1.784 m)  Weight: 219 lb (99.338 kg)  SpO2: 99%    PHYSICAL EXAM General: NAD Neck: No JVD, no thyromegaly. Lungs: Diminished at left base, otherwise clear to auscultation bilaterally with normal respiratory effort. CV: LVAD sounds appreciated. Trace pretibial and periankle edema, wearing compression stockings.  No carotid bruit.   Abdomen: Soft, nontender, no hepatosplenomegaly, no distention.  Neurologic: Alert and oriented x 3.  Psych: Normal affect. Extremities: No clubbing or cyanosis.   ECG: reviewed and available in electronic records.      ASSESSMENT AND PLAN: 1. Ischemic  cardiomyopathy/chronic systolic heart failure/LVAD: I will obtain a chest xray. He has trace lower extremity edema and is wearing compression stockings. Currently taking Lasix 60 mg daily. As per patient's request, I will have him evaluated in the heart failure/LVAD clinic with Dr. Gala Romney to make certain no adjustments need to be made. 2. CAD with prior PCI: Denies chest pain. Once his LVAD is assessed, I will determine if a stress test needs to be pursued  to evaluate his dizziness and fatigue. 3. Dizziness and breathlessness: I will refer him to the HF/LVAD clinic with Dr. Gala Romney  to make certain no adjustments need to be made. Would consider stress testing. BP is low normal. Will obtain a chest xray. 4. Hypertension: Currently low normal. Carvedilol previously withheld at appt at [facility] in 01/2014. 5. Atrial fibrillation: recent evaluation shows 100% atrial fibrillation. His INR is subtherapeutic. I will enroll him in the anticoagulation clinic with Vashti Hey.  Dispo: Will make referral to HF/LVAD clinic.  Prentice Docker, M.D., F.A.C.C.

## 2014-03-11 ENCOUNTER — Telehealth: Payer: Self-pay | Admitting: Cardiovascular Disease

## 2014-03-11 NOTE — Telephone Encounter (Signed)
That would be fine 

## 2014-03-11 NOTE — Telephone Encounter (Signed)
Patient notified

## 2014-03-11 NOTE — Telephone Encounter (Signed)
[name] called stating that he wants to continue doing his coumadin checks with [facility]. Wants to know if this will be ok.

## 2014-03-15 ENCOUNTER — Telehealth: Payer: Self-pay | Admitting: *Deleted

## 2014-03-15 NOTE — Telephone Encounter (Signed)
Message copied by Lesle Chris on Mon Mar 15, 2014  3:42 PM ------      Message from: Prentice Docker A      Created: Thu Mar 11, 2014  3:47 PM       Pleural effusions appear to be unchanged since 2013. Can inform pt. ------

## 2014-03-15 NOTE — Telephone Encounter (Signed)
Notes Recorded by Lesle Chris, LPN on 12/23/9756 at 3:41 PM Left message to return call.

## 2014-03-16 NOTE — Telephone Encounter (Signed)
Patient notified and verbalized understanding. 

## 2014-03-17 ENCOUNTER — Encounter: Payer: Self-pay | Admitting: *Deleted

## 2014-03-18 ENCOUNTER — Other Ambulatory Visit (HOSPITAL_COMMUNITY): Payer: Self-pay | Admitting: *Deleted

## 2014-03-18 ENCOUNTER — Ambulatory Visit (HOSPITAL_COMMUNITY)
Admission: RE | Admit: 2014-03-18 | Discharge: 2014-03-18 | Disposition: A | Discharge status: Home / Self Care | Payer: Medicare Other | Source: Ambulatory Visit | Attending: Internal Medicine | Admitting: Internal Medicine

## 2014-03-18 ENCOUNTER — Encounter (HOSPITAL_COMMUNITY): Payer: Self-pay

## 2014-03-18 VITALS — BP 112/0 | HR 90 | Resp 20 | Wt 229.0 lb

## 2014-03-18 DIAGNOSIS — I4891 Unspecified atrial fibrillation: Secondary | ICD-10-CM | POA: Insufficient documentation

## 2014-03-18 DIAGNOSIS — Z7901 Long term (current) use of anticoagulants: Secondary | ICD-10-CM | POA: Insufficient documentation

## 2014-03-18 DIAGNOSIS — Z95811 Presence of heart assist device: Secondary | ICD-10-CM

## 2014-03-18 DIAGNOSIS — R5383 Other fatigue: Secondary | ICD-10-CM

## 2014-03-18 DIAGNOSIS — I5022 Chronic systolic (congestive) heart failure: Secondary | ICD-10-CM | POA: Insufficient documentation

## 2014-03-18 DIAGNOSIS — R5381 Other malaise: Secondary | ICD-10-CM | POA: Insufficient documentation

## 2014-03-18 LAB — CBC
HCT: 35.7 % — ABNORMAL LOW (ref 39.0–52.0)
Hemoglobin: 11.2 g/dL — ABNORMAL LOW (ref 13.0–17.0)
MCH: 27.4 pg (ref 26.0–34.0)
MCHC: 31.4 g/dL (ref 30.0–36.0)
MCV: 87.3 fL (ref 78.0–100.0)
PLATELETS: 229 10*3/uL (ref 150–400)
RBC: 4.09 MIL/uL — AB (ref 4.22–5.81)
RDW: 15.7 % — ABNORMAL HIGH (ref 11.5–15.5)
WBC: 8 10*3/uL (ref 4.0–10.5)

## 2014-03-18 LAB — PROTIME-INR
INR: 1.54 — ABNORMAL HIGH (ref 0.00–1.49)
Prothrombin Time: 18.1 seconds — ABNORMAL HIGH (ref 11.6–15.2)

## 2014-03-18 LAB — COMPREHENSIVE METABOLIC PANEL
ALK PHOS: 137 U/L — AB (ref 39–117)
ALT: 13 U/L (ref 0–53)
AST: 21 U/L (ref 0–37)
Albumin: 3.3 g/dL — ABNORMAL LOW (ref 3.5–5.2)
BUN: 45 mg/dL — ABNORMAL HIGH (ref 6–23)
CALCIUM: 8.9 mg/dL (ref 8.4–10.5)
CO2: 28 meq/L (ref 19–32)
Chloride: 102 mEq/L (ref 96–112)
Creatinine, Ser: 2 mg/dL — ABNORMAL HIGH (ref 0.50–1.35)
GFR calc Af Amer: 36 mL/min — ABNORMAL LOW (ref >90)
GFR calc non Af Amer: 31 mL/min — ABNORMAL LOW (ref >90)
Glucose, Bld: 94 mg/dL (ref 70–99)
Potassium: 5 mEq/L (ref 3.7–5.3)
Sodium: 143 mEq/L (ref 137–147)
TOTAL PROTEIN: 6.3 g/dL (ref 6.0–8.3)
Total Bilirubin: 0.4 mg/dL (ref 0.3–1.2)

## 2014-03-18 LAB — LACTATE DEHYDROGENASE: LDH: 207 U/L (ref 94–250)

## 2014-03-18 LAB — PRO B NATRIURETIC PEPTIDE: Pro B Natriuretic peptide (BNP): 4552 pg/mL — ABNORMAL HIGH (ref 0–450)

## 2014-03-18 NOTE — Progress Notes (Signed)
HPI:  [name] is a 73 year old male with history of coronary artery disease with prior percutaneous coronary intervention, ischemic cardiomyopathy, chronic systolic heart failure. He had [name] implant at [facility] on 04/2012 and was most recently evaluated on 01/13/14 by Dr. Versie Starks. Pt has atrial fibrillation, history of CVA in 1987, hypertension, hyperlipidemia, CKD, NSVT, anemia, and DVT/PE.   At [facility], an enteroscopy performed on 07/06/13 revealed a non-bleeding gastric ulcer on antrum. Normal duodenum and jejunum.  Pulmonary consult 12/28/13 with Dr. Steele Berg for follow up after hospitalization.  Chest CT revealed peripheral GGOs in upper lobe and peripheral distribution. Infiltrates were not classic for CAP, but subsequent sputum cultures revealed 3+ klebsiella and 1+ other GNR. He was treated with a 14 day course of antibiotics with improvement in symptoms. Recommendations included If cough returns / breathing is worse, f/u with pulmonology and consider chest CT. Planned f/u with pulmonary in 3 months and at that time, will check ambulatory sat (6 min walk vs. Oxygen titration study).   The most recent echocardiogram performed on 09/14/2013 at [facility] revealed severe left ventricular dysfunction, mild right ventricular dysfunction, mild LVH, mild aortic and tricuspid regurgitation, and trivial mitral regurgitation.   Pt saw Dr. Johney Frame on 11/12/13 for electrophysiology followup. Per Dr. Johney Frame, since his ICD generator change at [facility] 9/14, he has done reasonably well. He did not have lead revision, though his RV threshold is chronically elevated and he has diaphragmatic stim with LV pacing. His LV lead was turned off at [facility].   ICD was evaluated on March 09, 2014 and was found to be functioning normally. He is in atrial fibrillation 100% of the time. RV pacing at 80. No ventricular arrhythmias were noted.   He is on Lasix 60 mg daily and is on warfarin for anticoagulation. At his last cardiology visit  at [facility], it was decided not to start carvedilol.   Pt reports several admissions to [facility] for low Hgb in August 2014, ICD battery change September 2014, low Hgb October 2014, and fall with fractured right femoral neck 11/16/13.   Follow up: He presents today experiencing episodic dizziness and dyspnea with exertion. He denies syncope. He had dizziness and a fall in 11/2013 which led to an femur fracture. He denies leg swelling and does wear compression stockings. He denies chest discomfort. He has felt somewhat fatigued recently. He has had a lot of difficulty driving to [facility] for his appointments and he and his wife requested to be seen closer to their home, and would like to establish shared care with our team.  States he is still participating in physical therapy post right femur fracture.  He takes 60 mg Lasix daily; reports weight has been stable at home 214 - 215 lbs.  Denies orthopnea, PND, bleeding or syncope. No problems with equipment or driveline.   MAP elevated at 112; re-checked at 98  Denies LVAD alarms. Denies driveline trauma, erythema or drainage. Denies ICD shocks.  Reports taking Coumadin as prescribed and adherence to anticoagulation based dietary restrictions. Denies bright red blood per rectum or melena, no dark urine or hematuria.     ICD interrogated personally in clinic. Chronic AF with RV-pacing at 80. Rate turned down to 60 and no longer V-pacing  Past Medical History  Diagnosis Date  . Diabetes mellitus   . 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 CAD (coronary artery disease)   . Gout   . Hypotension  ORTHOSTATIC  . DVT of lower extremity (deep venous thrombosis)     left  . Hypertension   . High cholesterol   . Pulmonary embolism on left 1988  . Myocardial infarction 1988  . Angina   . CHF (congestive heart failure)   . Heart murmur   . Mitral valve disorder   . Pneumonia 1993; 2009    "both serious cases"  . Pneumonia 2010    "not hospitalized"   . Shortness of breath on exertion   . Sleep apnea   . Blood transfusion   . Anemia   . H/O hiatal hernia   . Stomach ulcer 1987-1988  . Jaundice     "@ birth & when I was in 7th grade"  . 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 acetaminophen (TYLENOL) 500 MG tablet Take 500 mg by mouth every 6 (six) hours as needed.      . Cholecalciferol (VITAMIN D3) 2000 UNITS TABS Take 1 tablet by mouth daily.      . ferrous sulfate 324 (65 FE) MG TBEC Take 1 tablet by mouth daily.      . furosemide (LASIX) 40 MG tablet Take 60 mg by mouth daily.      [name]. gabapentin (NEURONTIN) 300 MG capsule Take 1 capsule by mouth 3 (three) times daily.      . Multiple Vitamin (MULTIVITAMIN) tablet Take 1 tablet by mouth daily.      [name]. omeprazole (PRILOSEC) 40 MG capsule Take 40 mg by mouth 2 (two) times daily.      . pravastatin (PRAVACHOL) 40 MG tablet Take one by mouth daily      . temazepam (RESTORIL) 15 MG capsule Take 1 capsule by mouth at bedtime as needed.      [name]. ULORIC 40 MG tablet Take 40 mg by mouth daily.      [name]. warfarin (COUMADIN) 2 MG tablet Take 2-4 mg by mouth as directed. Managed by [facility] Patient checks at home       No current facility-administered medications for this encounter.    Levofloxacin; Sulfonamide derivatives; Cefuroxime; and Hydralazine  REVIEW OF SYSTEMS: All systems negative except as listed in HPI, PMH and Problem list.  LVAD INTERROGATION:   HeartMate II LVAD:  Flow 5.2 liters/min, speed 9200, power 5.5, PI 6.7. 3 - 4 PI events daily. Few low voltage advisories.  I reviewed the LVAD parameters from today, and compared the results to the patient's prior recorded data.  No programming changes were made.  The LVAD is functioning within specified parameters.  The patient performs LVAD self-test daily.  LVAD interrogation was negative for any significant power  changes, alarms or PI events/speed drops.  LVAD equipment check completed and is in good working order.  Back-up equipment present.   LVAD education done on emergency procedures and precautions and reviewed exit site care.    Filed Vitals:   03/18/14 1014  BP: 112/0  Pulse: 90  Resp: 20  Weight: 229 lb (103.874 kg)  SpO2: 96%    Physical Exam: GENERAL: Well appearing, male who presents to clinic today in no acute distress. HEENT: normal  NECK: Supple, JVP 7 - 8 with prominent CV wave.  2+ bilaterally, no bruits.  No lymphadenopathy or thyromegaly appreciated.   CARDIAC:  Mechanical heart sounds with LVAD hum present.  LUNGS:  Clear to auscultation bilaterally.  ABDOMEN:  Soft, round, nontender, positive bowel sounds x4.     LVAD exit site: well-healed and incorporated.  Dressing dry and intact.  No erythema or drainage.  Stabilization device present and accurately applied.  Driveline dressing is being changed every 3 - 4 days per sterile technique. EXTREMITIES:  Warm and dry, no cyanosis, clubbing, rash.  NEUROLOGIC:  Alert and oriented x 4.  Gait steady with assistance of walker.  No aphasia.  No dysarthria.  Affect pleasant.     EKG: paced rate of 80 bpm  ASSESSMENT AND PLAN:  1) Chronic systolic HF: ICM, s/p LVAD 04/2012 at [facility]  - NYHA  II-III symptoms on LVAD support. Volume status mildly elevated . Continue Lasix 60 mg daily. Will take extra Lasix 60 mg today and as needed.. - Reinforced need for daily weights and reviewed use of sliding scale diuretics. 2) LVAD  - Implanted 04/2012 at [facility]. Interrogation looks good today.  - Draw CMET, LDH, INR and CBC today 3) Chronic Anticoagulation  - INR today 1.54. [facility] manages anticoagulation, Jean Rosenthal, NP notified of results per VAD coordinator. They have chosen not to bridge with lovenox at this point.  4) Atrial Fibrillation -chronic. On coumadin 5) Fatigue - suspect a component of deconditioning and possible chronic RV  pacing - ICD pacing VVIR 80. Re-programmed to VVIR 60 to minimize RV pacing - Continue PT 6) HTN -MAP mildly elevated. Unable to tolerate higher doses of meds due to dizziness. Will see if BP improves with diuresis.   Return to VAD clinic one month  Bevelyn Buckles Coltin Casher,MD 4:15 PM

## 2014-03-18 NOTE — Progress Notes (Signed)
Symptom  Yes  No  Details   Angina        x Activity:   Claudication        x       How far:  Room to room  Syncope       x When:  Dizziness/lightheaded occasionally  Stroke        x   1987 right side  Orthopnea       x How many pillows: one  PND       x How often:  CPAP        x     How many hrs:  All night  Pedal edema       x  wears support hose  Abd fullness       x   N&V       x  eats full meals  Diaphoresis       x When:  Bleeding      x   Urine color   yellow  SOB       x  Activity: walking room to room since last summer  Palpitations        x When:  ICD shock        x   Hospitlizaitons       x  When/where/why: August -low Hgb; Sept - battery change; Oct - low hgb; Dec - fall with fx right femur  ED visit        x When/where/why:  Other MD       x      When/who/why: Purvis Sheffield 03/10/14; Dr. Johney Frame 11/12/13  Activity     fatigue started last summer  Fluid     @ 1.5 liters/dau  Diet    No limitations   Vital signs: HR:  90 MAP BP:  112; re-checked 98 O2 Sat:  96 Wt:  229 lbs Ht:  5'10"  LVAD interrogation reveals:  Speed:  9200 Flow:  5.2 Power:   5.5 PI:  6.7 Alarms: few low voltage advisories Events:  Occasional PI events Fixed speed:  9200 Low speed limit: 8400  LVAD exit site:  Well healed and incorporated. The velour is fully implanted at exit site. Stabilization device present and accurately applied. Driveline dressing is being changed every 3 - 4 days per wife. Pt denies fever or chills. Pt/caregiver state they have adequate dressing supplies at home.  I reviewed the LVAD parameters from today. No programming changes were made. The LVAD is functioning within specified parameters.  LVAD interrogation was negative for any significant power changes or alarms; a few PI events/speed drops present which is consistent with the patient's history.   Pt is performing daily controller and system monitor self tests along with completing weekly and monthly maintenance for  LVAD equipment.   LVAD equipment check completed and is in good working order. Back-up equipment present. LVAD education done on emergency procedures and precautions and reviewed exit site care.

## 2014-03-30 ENCOUNTER — Encounter: Payer: Self-pay | Admitting: Internal Medicine

## 2014-04-21 ENCOUNTER — Encounter: Payer: Self-pay | Admitting: Internal Medicine

## 2014-04-21 ENCOUNTER — Encounter (HOSPITAL_COMMUNITY): Payer: Medicare Other

## 2014-06-07 ENCOUNTER — Telehealth: Payer: Self-pay | Admitting: *Deleted

## 2014-06-07 ENCOUNTER — Ambulatory Visit (INDEPENDENT_AMBULATORY_CARE_PROVIDER_SITE_OTHER): Payer: Medicare Other | Admitting: *Deleted

## 2014-06-07 ENCOUNTER — Encounter: Payer: Self-pay | Admitting: Internal Medicine

## 2014-06-07 DIAGNOSIS — I259 Chronic ischemic heart disease, unspecified: Secondary | ICD-10-CM

## 2014-06-07 DIAGNOSIS — I5023 Acute on chronic systolic (congestive) heart failure: Secondary | ICD-10-CM

## 2014-06-07 LAB — MDC_IDC_ENUM_SESS_TYPE_REMOTE
Battery Remaining Longevity: 54 mo
Brady Statistic AP VP Percent: 0 %
Brady Statistic AP VS Percent: 0 %
Brady Statistic AS VP Percent: 64.35 %
Brady Statistic AS VS Percent: 35.65 %
Brady Statistic RA Percent Paced: 0 %
Brady Statistic RV Percent Paced: 67.17 %
HighPow Impedance: 133 Ohm
HighPow Impedance: 35 Ohm
HighPow Impedance: 42 Ohm
Lead Channel Impedance Value: 304 Ohm
Lead Channel Impedance Value: 456 Ohm
Lead Channel Impedance Value: 456 Ohm
Lead Channel Sensing Intrinsic Amplitude: 1.875 mV
Lead Channel Setting Pacing Amplitude: 3.25 V
Lead Channel Setting Pacing Pulse Width: 1 ms
Lead Channel Setting Sensing Sensitivity: 0.3 mV
MDC IDC MSMT BATTERY VOLTAGE: 2.95 V
MDC IDC MSMT LEADCHNL LV IMPEDANCE VALUE: 627 Ohm
MDC IDC MSMT LEADCHNL LV PACING THRESHOLD AMPLITUDE: 1.875 V
MDC IDC MSMT LEADCHNL LV PACING THRESHOLD PULSEWIDTH: 0.4 ms
MDC IDC MSMT LEADCHNL RA SENSING INTR AMPL: 0.375 mV
MDC IDC MSMT LEADCHNL RA SENSING INTR AMPL: 0.375 mV
MDC IDC MSMT LEADCHNL RV IMPEDANCE VALUE: 323 Ohm
MDC IDC MSMT LEADCHNL RV SENSING INTR AMPL: 4.25 mV
MDC IDC SESS DTM: 20150706142637
MDC IDC SET ZONE DETECTION INTERVAL: 290 ms
Zone Setting Detection Interval: 340 ms
Zone Setting Detection Interval: 350 ms
Zone Setting Detection Interval: 450 ms

## 2014-06-07 NOTE — Progress Notes (Signed)
Remote ICD transmission.   

## 2014-06-07 NOTE — Telephone Encounter (Signed)
No appointments available. Message sent to [facility] staff requesting to add patient to clinic in September.

## 2014-06-10 NOTE — Telephone Encounter (Signed)
Patient added to 08/20/14 clinic with [facility] and was okayed by Belenda Cruise.

## 2014-06-23 ENCOUNTER — Encounter: Payer: Self-pay | Admitting: Cardiology

## 2014-07-06 ENCOUNTER — Telehealth (HOSPITAL_COMMUNITY): Payer: Self-pay | Admitting: *Deleted

## 2014-07-06 NOTE — Telephone Encounter (Signed)
Called pt left message that we are canceling his VAD clinic appt for tomorrow per Jean Rosenthal, NP at [facility].  We will re-schedule in October based on his instructions.  Asked pt to call if any questions; contact info given.

## 2014-07-07 ENCOUNTER — Encounter (HOSPITAL_COMMUNITY): Payer: Medicare Other

## 2014-08-20 ENCOUNTER — Ambulatory Visit (INDEPENDENT_AMBULATORY_CARE_PROVIDER_SITE_OTHER): Payer: Medicare Other | Admitting: Internal Medicine

## 2014-08-20 ENCOUNTER — Encounter: Payer: Self-pay | Admitting: Internal Medicine

## 2014-08-20 VITALS — BP 99/66 | HR 63 | Ht 70.0 in | Wt 215.1 lb

## 2014-08-20 DIAGNOSIS — I4891 Unspecified atrial fibrillation: Secondary | ICD-10-CM

## 2014-08-20 DIAGNOSIS — I482 Chronic atrial fibrillation, unspecified: Secondary | ICD-10-CM

## 2014-08-20 DIAGNOSIS — I5023 Acute on chronic systolic (congestive) heart failure: Secondary | ICD-10-CM

## 2014-08-20 DIAGNOSIS — I2510 Atherosclerotic heart disease of native coronary artery without angina pectoris: Secondary | ICD-10-CM

## 2014-08-20 DIAGNOSIS — I5022 Chronic systolic (congestive) heart failure: Secondary | ICD-10-CM

## 2014-08-20 LAB — MDC_IDC_ENUM_SESS_TYPE_INCLINIC
Battery Remaining Longevity: 63 mo
Battery Voltage: 2.95 V
Brady Statistic AP VP Percent: 0 %
Brady Statistic AS VP Percent: 64.76 %
Brady Statistic RA Percent Paced: 0 %
Brady Statistic RV Percent Paced: 67.5 %
Date Time Interrogation Session: 20150918112304
HighPow Impedance: 171 Ohm
HighPow Impedance: 37 Ohm
HighPow Impedance: 45 Ohm
Lead Channel Impedance Value: 665 Ohm
Lead Channel Pacing Threshold Amplitude: 1.25 V
Lead Channel Pacing Threshold Amplitude: 1.875 V
Lead Channel Pacing Threshold Pulse Width: 1 ms
Lead Channel Sensing Intrinsic Amplitude: 0.75 mV
Lead Channel Setting Pacing Amplitude: 2.5 V
Lead Channel Setting Pacing Pulse Width: 1 ms
Lead Channel Setting Sensing Sensitivity: 0.3 mV
MDC IDC MSMT LEADCHNL LV IMPEDANCE VALUE: 323 Ohm
MDC IDC MSMT LEADCHNL LV IMPEDANCE VALUE: 494 Ohm
MDC IDC MSMT LEADCHNL LV PACING THRESHOLD PULSEWIDTH: 0.4 ms
MDC IDC MSMT LEADCHNL RA IMPEDANCE VALUE: 456 Ohm
MDC IDC MSMT LEADCHNL RV IMPEDANCE VALUE: 323 Ohm
MDC IDC MSMT LEADCHNL RV SENSING INTR AMPL: 4.25 mV
MDC IDC STAT BRADY AP VS PERCENT: 0 %
MDC IDC STAT BRADY AS VS PERCENT: 35.24 %
Zone Setting Detection Interval: 290 ms
Zone Setting Detection Interval: 340 ms
Zone Setting Detection Interval: 350 ms
Zone Setting Detection Interval: 450 ms

## 2014-08-20 NOTE — Patient Instructions (Signed)
Your physician recommends that you schedule a follow-up appointment in: 1 year with Dr. Johney Frame. You will receive a reminder letter in the mail in about 10 months reminding you to call and schedule your appointment. If you don't receive this letter, please contact our office. Carelink/device check 11/22/14. Your physician recommends that you continue on your current medications as directed. Please refer to the Current Medication list given to you today.

## 2014-08-20 NOTE — Progress Notes (Signed)
PCP:  Ignatius Specking., MD Primary Cardiologist;  Dr Gala Romney, also followed by the [facility] transplant team  The patient presents today for electrophysiology followup. Since his last visit, he has done reasonably well.  He did not have lead revision, though his RV threshold is chronically elevated and he has diaphragmatic stim with LV pacing.  His LV lead was turned off at [facility].  He has fatigue chronically.  Today, he denies symptoms of palpitations, chest pain, shortness of breath, orthopnea, PND, dizziness, presyncope, syncope, or neurologic sequela.  The patient feels that he is tolerating medications without difficulties and is otherwise without complaint today.   Past Medical History  Diagnosis Date  . Diabetes mellitus   . 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 CAD (coronary artery disease)   . Gout   . Hypotension     ORTHOSTATIC  . DVT of lower extremity (deep venous thrombosis)     left  . Hypertension   . High cholesterol   . Pulmonary embolism on left 1988  . Myocardial infarction 1988  . Angina   . CHF (congestive heart failure)   . Heart murmur   . Mitral valve disorder   . Pneumonia 1993; 2009    "both serious cases"  . Pneumonia 2010    "not hospitalized"  . Shortness of breath on exertion   . Sleep apnea   . Blood transfusion   . Anemia   . H/O hiatal hernia   . Stomach ulcer 1987-1988  . Jaundice     "@ birth & when I was in 7th grade"  . 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 Biv  11/2010    ICD 12/11 MEDTRONIC  . Cataract extraction w/ intraocular lens  implant, bilateral  1990's  . Exploratory laparotomy w/ bowel resection  1993  . Tracheostomy  1993  . Coronary angioplasty  1988  . Coronary angioplasty with stent placement  2003    "1"  . Cardiac catheterization  12/13/11  . Leff ventricular assist device (lvad)  05/02/12    LVAD implanted at [facility]   . Implantable cardioverter defibrillator generator change  08/19/13    MDT Coletta Memos CRT-D generator change by Dr Wilford Grist at [facility].  RV lead with chronically elevated threshold and low R waves not revised.  LV lead with chronic diaphrgramtic stim turned off at [facility].    Current Outpatient Prescriptions  Medication Sig Dispense Refill  . acetaminophen (TYLENOL) 500 MG tablet Take 500 mg by mouth every 6 (six) hours as needed.      [name] albuterol (PROVENTIL HFA;VENTOLIN HFA) 108 (90 BASE) MCG/ACT inhaler Inhale 1-2 puffs into the lungs as needed for wheezing or shortness of breath.      . budesonide-formoterol (SYMBICORT) 160-4.5 MCG/ACT inhaler Inhale 2 puffs into the lungs daily as needed.      . calcium carbonate (OS-CAL) 600 MG TABS tablet Take 600 mg by mouth 2 (two) times daily.      . Cholecalciferol (VITAMIN D3) 2000 UNITS TABS Take 1 tablet by mouth daily.      . colchicine 0.6 MG tablet Take 0.6 mg by mouth daily.      . ferrous sulfate 324 (65 FE) MG TBEC Take 1 tablet by mouth daily.      [name] gabapentin (NEURONTIN) 300 MG capsule Take 1 capsule by mouth 3 (  three) times daily.      [name] HYDROcodone-acetaminophen (NORCO/VICODIN) 5-325 MG per tablet Take 1 tablet by mouth 3 (three) times daily as needed for moderate pain.      . magnesium oxide (MAG-OX) 400 MG tablet Take 400 mg by mouth 2 (two) times daily.      . Multiple Vitamin (MULTIVITAMIN) tablet Take 1 tablet by mouth daily.      [name] omeprazole (PRILOSEC) 40 MG capsule Take 40 mg by mouth 2 (two) times daily.      . potassium chloride SA (K-DUR,KLOR-CON) 20 MEQ tablet Take 20 mEq by mouth daily.      . pravastatin (PRAVACHOL) 40 MG tablet Take one by mouth daily      . temazepam (RESTORIL) 15 MG capsule Take 1 capsule by mouth at bedtime as needed.      . torsemide (DEMADEX) 20 MG tablet Take 20 mg by mouth every morning.      . warfarin (COUMADIN) 1 MG tablet Take 1 mg by mouth daily. As directed per [facility]      . warfarin (COUMADIN) 2 MG  tablet Take 2-4 mg by mouth as directed. Managed by [facility] Patient checks at home       No current facility-administered medications for this visit.    Allergies  Allergen Reactions  . Levofloxacin Other (See Comments)    Doesn't tolerate  . Sulfonamide Derivatives Other (See Comments)    unknown  . Cefuroxime Rash  . Hydralazine Rash    History   Social History  . Marital Status: Married    Spouse Name: N/A    Number of Children: N/A  . Years of Education: N/A   Occupational History  . Not on file.   Social History Main Topics  . Smoking status: Former Smoker -- 1.00 packs/day for 30 years    Types: Cigarettes    Quit date: 12/03/1986  . Smokeless tobacco: Never Used  . Alcohol Use: No  . Drug Use: No  . Sexual Activity: No   Other Topics Concern  . Not on file   Social History Narrative  . No narrative on file    Physical Exam: Filed Vitals:   08/20/14 1107  BP: 99/66  Pulse: 63  Height:  (1.778 m)  Weight: 215 lb 1.9 oz (97.578 kg)  SpO2: 96%    GEN- The patient is well appearing, alert and oriented x 3 today.  He has an LVAD Head- normocephalic, atraumatic Eyes-  Sclera clear, conjunctiva pink Ears- hearing intact Oropharynx- clear Neck- supple,  Lungs- Clear to ausculation bilaterally, normal work of breathing Chest- ICD pocket is well healed Heart- LVAD hum is prominent GI- soft, NT, ND, + BS Extremities- no clubbing, cyanosis, or edema  ICD interrogation- reviewed in detail today,  See PACEART report  Assessment and Plan:  1. BIV ICD lead issue LV lead was turned off at [facility] for diaphragmatic stim.  His RV lead threshold is chronically elevated and R waves are low but was not revised at [facility].   He is RV paced 68% but appears to tolerate this ok.  Dr Gala Romney has decreased lower pacing rate from 70 to 60 previously without any clinical deterioration.  I would favor consideration of reducing his pacing rate to 50 eventually but will  defer to Dr Gala Romney since he has a VAD.  It would be nice to reduce RV pacing if possible.  2. Chronic systolic dysfunction Clinically much improved s/p LVAD placement  He  will continue to follow closely at [facility] and with the [facility] CHF clinic   3. Afib permanent Continue long term anticoagulation  He will return to see me in 12 months in [facility]

## 2014-09-15 ENCOUNTER — Encounter (HOSPITAL_COMMUNITY): Payer: Self-pay | Admitting: *Deleted

## 2014-09-15 ENCOUNTER — Ambulatory Visit (HOSPITAL_COMMUNITY)
Admission: RE | Admit: 2014-09-15 | Discharge: 2014-09-15 | Disposition: A | Discharge status: Home / Self Care | Payer: Medicare Other | Source: Ambulatory Visit | Attending: Cardiology | Admitting: Cardiology

## 2014-09-15 ENCOUNTER — Other Ambulatory Visit (HOSPITAL_COMMUNITY): Payer: Self-pay | Admitting: *Deleted

## 2014-09-15 VITALS — BP 108/0 | HR 68 | Ht 70.0 in | Wt 211.8 lb

## 2014-09-15 DIAGNOSIS — R5383 Other fatigue: Secondary | ICD-10-CM | POA: Diagnosis not present

## 2014-09-15 DIAGNOSIS — I482 Chronic atrial fibrillation, unspecified: Secondary | ICD-10-CM

## 2014-09-15 DIAGNOSIS — I10 Essential (primary) hypertension: Secondary | ICD-10-CM | POA: Insufficient documentation

## 2014-09-15 DIAGNOSIS — Z8673 Personal history of transient ischemic attack (TIA), and cerebral infarction without residual deficits: Secondary | ICD-10-CM | POA: Diagnosis not present

## 2014-09-15 DIAGNOSIS — Z79899 Other long term (current) drug therapy: Secondary | ICD-10-CM | POA: Diagnosis not present

## 2014-09-15 DIAGNOSIS — I255 Ischemic cardiomyopathy: Secondary | ICD-10-CM | POA: Insufficient documentation

## 2014-09-15 DIAGNOSIS — I509 Heart failure, unspecified: Secondary | ICD-10-CM

## 2014-09-15 DIAGNOSIS — Z86711 Personal history of pulmonary embolism: Secondary | ICD-10-CM | POA: Diagnosis not present

## 2014-09-15 DIAGNOSIS — E780 Pure hypercholesterolemia: Secondary | ICD-10-CM | POA: Diagnosis not present

## 2014-09-15 DIAGNOSIS — I5022 Chronic systolic (congestive) heart failure: Secondary | ICD-10-CM | POA: Insufficient documentation

## 2014-09-15 DIAGNOSIS — E119 Type 2 diabetes mellitus without complications: Secondary | ICD-10-CM | POA: Insufficient documentation

## 2014-09-15 DIAGNOSIS — Z95811 Presence of heart assist device: Secondary | ICD-10-CM | POA: Diagnosis not present

## 2014-09-15 DIAGNOSIS — I2510 Atherosclerotic heart disease of native coronary artery without angina pectoris: Secondary | ICD-10-CM | POA: Diagnosis not present

## 2014-09-15 DIAGNOSIS — Z7901 Long term (current) use of anticoagulants: Secondary | ICD-10-CM | POA: Diagnosis not present

## 2014-09-15 DIAGNOSIS — Z86718 Personal history of other venous thrombosis and embolism: Secondary | ICD-10-CM | POA: Diagnosis not present

## 2014-09-15 LAB — CBC
HCT: 38.3 % — ABNORMAL LOW (ref 39.0–52.0)
Hemoglobin: 12 g/dL — ABNORMAL LOW (ref 13.0–17.0)
MCH: 27 pg (ref 26.0–34.0)
MCHC: 31.3 g/dL (ref 30.0–36.0)
MCV: 86.3 fL (ref 78.0–100.0)
PLATELETS: 168 10*3/uL (ref 150–400)
RBC: 4.44 MIL/uL (ref 4.22–5.81)
RDW: 17.8 % — ABNORMAL HIGH (ref 11.5–15.5)
WBC: 5.6 10*3/uL (ref 4.0–10.5)

## 2014-09-15 LAB — BASIC METABOLIC PANEL
Anion gap: 13 (ref 5–15)
BUN: 52 mg/dL — ABNORMAL HIGH (ref 6–23)
CHLORIDE: 103 meq/L (ref 96–112)
CO2: 25 meq/L (ref 19–32)
Calcium: 9 mg/dL (ref 8.4–10.5)
Creatinine, Ser: 1.86 mg/dL — ABNORMAL HIGH (ref 0.50–1.35)
GFR calc non Af Amer: 34 mL/min — ABNORMAL LOW (ref >90)
GFR, EST AFRICAN AMERICAN: 39 mL/min — AB (ref >90)
Glucose, Bld: 94 mg/dL (ref 70–99)
Potassium: 4.6 mEq/L (ref 3.7–5.3)
Sodium: 141 mEq/L (ref 137–147)

## 2014-09-15 LAB — PROTIME-INR
INR: 2.16 — ABNORMAL HIGH (ref 0.00–1.49)
Prothrombin Time: 24.3 seconds — ABNORMAL HIGH (ref 11.6–15.2)

## 2014-09-15 LAB — PRO B NATRIURETIC PEPTIDE: PRO B NATRI PEPTIDE: 1793 pg/mL — AB (ref 0–450)

## 2014-09-15 LAB — LACTATE DEHYDROGENASE: LDH: 239 U/L (ref 94–250)

## 2014-09-15 NOTE — Progress Notes (Signed)
Symptom  Yes  No  Details   Angina        x Activity:   Claudication        x       How far:  Room to room  Syncope              x When: dizziness with orthostatic changes  Stroke        x   1987 right side  Orthopnea       x How many pillows: one  PND       x How often:  CPAP        x     How many hrs:  All night  Pedal edema       x  wears support hose  Abd fullness       x   N&V       x  eats full meals  Diaphoresis       x When:  Bleeding      x   Urine color   yellow  SOB       x  Activity: walking room to room   Palpitations        x When:  ICD shock        x   Hospitlizaitons             x When/where/why:  ED visit        x When/where/why:  Other MD       x      When/who/why: 06/15/14 Jean Rosenthal; 06/15/14 [facility] pulmonology; 06/29/14 [facility] pulmonology; 08/17/14 [facility] orthopedics;  08/20/14 EP Dr. Johney Frame  Activity     fatigue started last summer  Fluid     @ 1.5 liters/dau  Diet    No limitations   Vital signs: HR:  68 MAP BP:  108; 103/74 (81) O2 Sat:  98 Wt:  211.8  Lbs Last weight:  229 lbs Ht:  5'10"  LVAD interrogation reveals:  Speed:  9200 Flow:  4.5 Power:   5.2 PI:  7.2 Alarms: one low voltage advisories Events:  rare PI event Fixed speed:  9200 Low speed limit: 8400 Primary Controller:  Replace back up battery in  24 months (Mar 2017) Back up controller:   Replace back up battery in  22 months (Jan 2017)   LVAD exit site:  Well healed and incorporated. The velour is fully implanted at exit site. Stabilization device present and accurately applied. Driveline dressing is being changed every 3 - 4 days per wife. Pt denies fever or chills. Pt/caregiver state they have adequate dressing supplies at home.  I reviewed the LVAD parameters from today. No programming changes were made. The LVAD is functioning within specified parameters.  LVAD interrogation was negative for any significant power changes or alarms; a few PI events/speed drops present which is consistent  with the patient's history.   Pt is performing daily controller and system monitor self tests along with completing weekly and monthly maintenance for LVAD equipment.   LVAD equipment check completed and is in good working order. Back-up equipment present.  Pt and wife concerned since son re-married and moved away, they asked if emergency VAD education could be repeated. LVAD education done on emergency procedures and precautions using training loop. Reviewed yellow wrench, red battery, and red heart alarms including possible causes and correct steps with each advisory or alarm.  Reviewed Patient and Caregiver alarm guidelines and extra copies given.  Reviewed changing controller; wife completed controller change out three times including placing controller in sleep mode after exchange.  Emphasized contacting [facility] VAD team or [facility] pager before attempting to change out controller. Both patient and wife verbalized understanding of same. Wife successfully paged [facility] V[facility] pager; contact information given.

## 2014-09-15 NOTE — Progress Notes (Signed)
Patient ID: [name], male   DOB: 1941/02/21, 73 y.o.   MRN: [id number]  Cardiac Surgeon : Dr Aundria Rudogers  HPI:  [name] is a 73 year old male with history of coronary artery disease with prior percutaneous coronary intervention, ischemic cardiomyopathy, chronic systolic heart failure. He had [name] implant [facility] on 04/2012 ly evaluated on 01/13/14 by Dr. Versie StarksJoseph Rogers. Pt has atrial fibrillation, history of CVA in 1987, hypertension, hyperlipidemia, CKD, NSVT, anemia, and DVT/PE.   At [facility], an enteroscopy performed on 07/06/13 revealed a non-bleeding gastric ulcer on antrum. Normal duodenum and jejunum.  Pulmonary consult 12/28/13 with Dr. Steele BergBarkauskas for follow up after hospitalization.  Chest CT revealed peripheral GGOs in upper lobe and peripheral distribution. Infiltrates were not classic for CAP, but subsequent sputum cultures revealed 3+ klebsiella and 1+ other GNR. He was treated with a 14 day course of antibiotics with improvement in symptoms. Recommendations included If cough returns / breathing is worse, f/u with pulmonology and consider chest CT. Planned f/u with pulmonary in 3 months and at that time, will check ambulatory sat (6 min walk vs. Oxygen titration study).   The most recent echocardiogram performed on 09/14/2013 [facility] revealed severe left ventricular dysfunction, mild right ventricular dysfunction, mild LVH, mild aortic and tricuspid regurgitation, and trivial mitral regurgitation.   Pt saw Dr. Johney FrameAllred on 11/12/13 for electrophysiology followup. Per Dr. Johney FrameAllred, since his ICD generator change at [facility] 9/14, he has done reasonably well. He did not have lead revision, though his RV threshold is chronically elevated and he has diaphragmatic stim with LV pacing. His LV lead was [facility].   ICD was evaluated on March 09, 2014 and was found to be functioning normally. He is in atrial fibrillation 100% of the time. RV pacing at 80. No ventricular arrhythmias were noted.   He  is on Lasix 60 mg daily and is on warfarin for anticoagulation. At his last cardi[facility] visit at [facility], it was decided not to start carvedilol.   Pt [facility] for low Hgb in August 2014, ICD battery change September 2014, low Hgb October 2014, and fall with fractured right femoral neck 11/16/13.   [facility] and at that time lasix was stopped and torsemide 40 mg daily was started. Also started on mag ox 400 mg twice a day. Later torsemide was cut back to 20 mg daily and mag oxide was cut back to 400 mg daily due to diarrhea.   Follow up: He was evaluated by Dr Johney FrameAllred 9/18 with recommendations to adjust RV pacing to 50. He presents today with fatigue.and foot pain with his wife. Weight at home 200-203 pounds. Now on torsemide 20 mg daily and this seems to be working well. Denies PND/Orthopena. Does admit to mild dyspnea with exertion. Taking all medications.   Denies LVAD alarms. Denies driveline trauma, erythema or drainage. Denies ICD shocks.  Reports taking Coumadin as prescribed and adherence to anticoagulation based dietary restrictions. Denies bright red blood per rectum or melena, no dark urine or hematuria.       Past Medical History  Diagnosis Date  . Diabetes mellitus   . 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 CAD (coronary artery disease)   . Gout   . Hypotension     ORTHOSTATIC  . DVT of lower extremity (deep venous thrombosis)     left  . Hypertension   .  High cholesterol   . Pulmonary embolism on left 1988  . Myocardial infarction 1988  . Angina   . CHF (congestive heart failure)   . Heart murmur   . Mitral valve disorder   . Pneumonia 1993; 2009    "both serious cases"  . Pneumonia 2010    "not hospitalized"  . Shortness of breath on exertion   . Sleep apnea   . Blood transfusion   . Anemia   . H/O hiatal hernia   . Stomach ulcer 1987-1988  . Jaundice     "@ birth & when I was in 7th grade"  . Stroke  1987    "brain stem; skin sensitive since then; once in awhile slur"12/13/11)    Current Outpatient Prescriptions  Medication Sig Dispense Refill  . acetaminophen (TYLENOL) 500 MG tablet Take 500 mg by mouth every 6 (six) hours as needed.      [name] albuterol (PROVENTIL HFA;VENTOLIN HFA) 108 (90 BASE) MCG/ACT inhaler Inhale 1-2 puffs into the lungs as needed for wheezing or shortness of breath.      . budesonide-formoterol (SYMBICORT) 160-4.5 MCG/ACT inhaler Inhale 2 puffs into the lungs daily as needed.      . calcium carbonate (OS-CAL) 600 MG TABS tablet Take 600 mg by mouth 2 (two) times daily.      . Cholecalciferol (VITAMIN D3) 2000 UNITS TABS Take 1 tablet by mouth daily.      . colchicine 0.6 MG tablet Take 0.6 mg by mouth daily.      . ferrous sulfate 324 (65 FE) MG TBEC Take 1 tablet by mouth daily.      [name] gabapentin (NEURONTIN) 300 MG capsule Take 1 capsule by mouth 3 (three) times daily.      [name] HYDROcodone-acetaminophen (NORCO/VICODIN) 5-325 MG per tablet Take 1 tablet by mouth 3 (three) times daily as needed for moderate pain.      . magnesium oxide (MAG-OX) 400 MG tablet Take 400 mg by mouth 2 (two) times daily.      . Multiple Vitamin (MULTIVITAMIN) tablet Take 1 tablet by mouth daily.      [name] omeprazole (PRILOSEC) 40 MG capsule Take 40 mg by mouth 2 (two) times daily.      . potassium chloride SA (K-DUR,KLOR-CON) 20 MEQ tablet Take 20 mEq by mouth daily.      . pravastatin (PRAVACHOL) 40 MG tablet Take one by mouth daily      . temazepam (RESTORIL) 15 MG capsule Take 1 capsule by mouth at bedtime as needed.      . torsemide (DEMADEX) 20 MG tablet Take 20 mg by mouth every morning.      . warfarin (COUMADIN) 1 MG tablet Take 1 mg by mouth daily. As directed per [facility]      . warfarin (COUMADIN) 2 MG tablet Take 2-4 mg by mouth as directed. Managed by [facility] Patient checks at home       No current facility-administered medications for this encounter.    Levofloxacin; Sulfonamide  derivatives; Cefuroxime; and Hydralazine  REVIEW OF SYSTEMS: All systems negative except as listed in HPI, PMH and Problem list.  LVAD INTERROGATION:  HeartMate II LVAD:  Flow 4.5  liters/min, speed 9200, power 5.2, PI 7.2  Rare PI.   I reviewed the LVAD parameters from today, and compared the results to the patient's prior recorded data.  No programming changes were made.  The LVAD is functioning within specified parameters.  The patient performs LVAD self-test daily.  LVAD interrogation was negative for any significant power changes, alarms or PI events/speed drops.  LVAD equipment check completed and is in good working order.  Back-up equipment present.   LVAD education done on emergency procedures and precautions and reviewed exit site care.    Filed Vitals:   09/15/14 1129  BP: 108/0  Pulse: 68  Height: 5\' 10"  (1.778 m)  Weight: 211 lb 12.8 oz (96.072 kg)  SpO2: 98%   MAP Doppler  108 BP 103/74 Physical Exam: GENERAL: Well appearing, male who presents to clinic today in no acute distress. HEENT: normal  NECK: Supple, JVP 5-6  with prominent CV wave.  2+ bilaterally, no bruits.  No lymphadenopathy or thyromegaly appreciated.   CARDIAC:  Mechanical heart sounds with LVAD hum present.  LUNGS:  Clear to auscultation bilaterally.  ABDOMEN:  Soft, round, nontender, positive bowel sounds x4.     LVAD exit site: well-healed and incorporated.  Dressing dry and intact.  No erythema or drainage.  Stabilization device present and accurately applied.  Driveline dressing is being changed every 3 - 4 days per sterile technique. EXTREMITIES:  Warm and dry, no cyanosis, clubbing, rash.  NEUROLOGIC:  Alert and oriented x 4.  Gait steady with assistance of walker.  No aphasia.  No dysarthria.  Affect pleasant.       ASSESSMENT AND PLAN:  1) Chronic systolic HF: ICM, s/p LVAD 04/2012 at [facility]  - NYHA  II-III symptoms on LVAD support. Volume status stable Continue torsemide 20 mg daily.  -  Reinforced need for daily weights and reviewed use of sliding scale diuretics. 2) LVAD  - Implanted 04/2012 at [facility]. Interrogation looks good today.  - Draw CMET, LDH, INR and CBC today 3) Chronic Anticoagulation  -Check INR.  [facility] manages anticoagulation, Hayden Rosenthal, NP.   4) Atrial Fibrillation -chronic. On coumadin 5) Fatigue - suspect a component of deconditioning and possible chronic RV pacing.--  6) HTN -MAP ok. Unable to tolerate higher doses of meds due to dizziness. Will see if BP improves with diuresis.  7) BiV ICD- As noted per Dr Johney Frame LV lead turned off by [facility] due to diaphragmatic stimulation. Discussed with Dr Gala Romney and will reduce pacing rate to 50 to reduce RV pacing.   Return to VAD clinic in 6 months   Jeston Junkins NP-C  11:30 AM

## 2014-11-11 ENCOUNTER — Encounter (HOSPITAL_COMMUNITY): Payer: Self-pay | Admitting: Internal Medicine

## 2014-11-22 ENCOUNTER — Telehealth: Payer: Self-pay | Admitting: Cardiology

## 2014-11-22 ENCOUNTER — Ambulatory Visit (INDEPENDENT_AMBULATORY_CARE_PROVIDER_SITE_OTHER): Payer: Medicare Other | Admitting: *Deleted

## 2014-11-22 ENCOUNTER — Encounter: Payer: Self-pay | Admitting: Internal Medicine

## 2014-11-22 DIAGNOSIS — I469 Cardiac arrest, cause unspecified: Secondary | ICD-10-CM

## 2014-11-22 LAB — MDC_IDC_ENUM_SESS_TYPE_REMOTE
Battery Remaining Longevity: 60 mo
Battery Voltage: 2.96 V
Brady Statistic AP VS Percent: 0 %
Brady Statistic AS VP Percent: 63.74 %
Brady Statistic AS VS Percent: 36.26 %
Brady Statistic RA Percent Paced: 0 %
Date Time Interrogation Session: 20151221215708
HIGH POWER IMPEDANCE MEASURED VALUE: 47 Ohm
HighPow Impedance: 37 Ohm
Lead Channel Impedance Value: 304 Ohm
Lead Channel Impedance Value: 323 Ohm
Lead Channel Impedance Value: 513 Ohm
Lead Channel Impedance Value: 703 Ohm
Lead Channel Sensing Intrinsic Amplitude: 0.375 mV
Lead Channel Sensing Intrinsic Amplitude: 0.875 mV
MDC IDC MSMT LEADCHNL LV PACING THRESHOLD AMPLITUDE: 1.875 V
MDC IDC MSMT LEADCHNL LV PACING THRESHOLD PULSEWIDTH: 0.4 ms
MDC IDC MSMT LEADCHNL RA IMPEDANCE VALUE: 456 Ohm
MDC IDC MSMT LEADCHNL RA SENSING INTR AMPL: 0.375 mV
MDC IDC MSMT LEADCHNL RV IMPEDANCE VALUE: 342 Ohm
MDC IDC MSMT LEADCHNL RV SENSING INTR AMPL: 0.875 mV
MDC IDC SET LEADCHNL RV PACING AMPLITUDE: 2.5 V
MDC IDC SET LEADCHNL RV PACING PULSEWIDTH: 1 ms
MDC IDC SET LEADCHNL RV SENSING SENSITIVITY: 0.3 mV
MDC IDC STAT BRADY AP VP PERCENT: 0 %
MDC IDC STAT BRADY RV PERCENT PACED: 66.41 %
Zone Setting Detection Interval: 290 ms
Zone Setting Detection Interval: 340 ms
Zone Setting Detection Interval: 350 ms
Zone Setting Detection Interval: 450 ms

## 2014-11-22 NOTE — Telephone Encounter (Signed)
LMOVM reminding pt to send remote transmission.   

## 2014-11-22 NOTE — Progress Notes (Signed)
Remote ICD transmission.   

## 2014-12-01 ENCOUNTER — Encounter: Payer: Self-pay | Admitting: Internal Medicine

## 2014-12-01 ENCOUNTER — Ambulatory Visit (INDEPENDENT_AMBULATORY_CARE_PROVIDER_SITE_OTHER): Payer: Medicare Other | Admitting: *Deleted

## 2014-12-01 DIAGNOSIS — I255 Ischemic cardiomyopathy: Secondary | ICD-10-CM

## 2014-12-01 LAB — MDC_IDC_ENUM_SESS_TYPE_INCLINIC
Brady Statistic AP VP Percent: 0 %
Brady Statistic AP VS Percent: 0 %
Brady Statistic AS VP Percent: 63.56 %
Brady Statistic AS VS Percent: 36.44 %
Brady Statistic RA Percent Paced: 0 %
Brady Statistic RV Percent Paced: 66.25 %
HIGH POWER IMPEDANCE MEASURED VALUE: 38 Ohm
HighPow Impedance: 171 Ohm
HighPow Impedance: 47 Ohm
Lead Channel Impedance Value: 323 Ohm
Lead Channel Impedance Value: 342 Ohm
Lead Channel Impedance Value: 494 Ohm
Lead Channel Impedance Value: 703 Ohm
Lead Channel Sensing Intrinsic Amplitude: 1 mV
Lead Channel Sensing Intrinsic Amplitude: 1 mV
Lead Channel Sensing Intrinsic Amplitude: 2.5 mV
Lead Channel Setting Pacing Pulse Width: 1 ms
Lead Channel Setting Sensing Sensitivity: 0.3 mV
MDC IDC MSMT BATTERY REMAINING LONGEVITY: 59 mo
MDC IDC MSMT BATTERY VOLTAGE: 2.96 V
MDC IDC MSMT LEADCHNL LV PACING THRESHOLD AMPLITUDE: 1.875 V
MDC IDC MSMT LEADCHNL LV PACING THRESHOLD PULSEWIDTH: 0.4 ms
MDC IDC MSMT LEADCHNL RA IMPEDANCE VALUE: 456 Ohm
MDC IDC MSMT LEADCHNL RA SENSING INTR AMPL: 0.5 mV
MDC IDC SESS DTM: 20151230140404
MDC IDC SET LEADCHNL RV PACING AMPLITUDE: 2.5 V
MDC IDC SET ZONE DETECTION INTERVAL: 340 ms
Zone Setting Detection Interval: 290 ms
Zone Setting Detection Interval: 350 ms
Zone Setting Detection Interval: 450 ms

## 2014-12-01 NOTE — Progress Notes (Signed)
ICD check in clinic for decreased Rwaves. Rwaves on todays check 2.26mV and on last remote check Rwave measurement 0.30mV. Lead impedances and threshold testing stable. Pt has had decreased Rwaves in past. Follow up as planned.

## 2015-02-21 ENCOUNTER — Ambulatory Visit (INDEPENDENT_AMBULATORY_CARE_PROVIDER_SITE_OTHER): Payer: Medicare Other | Admitting: *Deleted

## 2015-02-21 ENCOUNTER — Telehealth: Payer: Self-pay | Admitting: Cardiology

## 2015-02-21 DIAGNOSIS — I509 Heart failure, unspecified: Secondary | ICD-10-CM | POA: Diagnosis not present

## 2015-02-21 DIAGNOSIS — I255 Ischemic cardiomyopathy: Secondary | ICD-10-CM

## 2015-02-21 NOTE — Progress Notes (Signed)
Remote ICD transmission.   

## 2015-02-21 NOTE — Telephone Encounter (Signed)
Spoke with pt and reminded pt of remote transmission that is due today. Pt verbalized understanding.   

## 2015-02-22 LAB — MDC_IDC_ENUM_SESS_TYPE_REMOTE
Battery Remaining Longevity: 52 mo
Brady Statistic AP VS Percent: 0 %
Brady Statistic AS VP Percent: 62.74 %
Brady Statistic AS VS Percent: 37.26 %
Brady Statistic RA Percent Paced: 0 %
Date Time Interrogation Session: 20160321164029
HIGH POWER IMPEDANCE MEASURED VALUE: 51 Ohm
HighPow Impedance: 41 Ohm
Lead Channel Impedance Value: 323 Ohm
Lead Channel Impedance Value: 399 Ohm
Lead Channel Impedance Value: 532 Ohm
Lead Channel Impedance Value: 760 Ohm
Lead Channel Pacing Threshold Amplitude: 1.875 V
Lead Channel Sensing Intrinsic Amplitude: 0.875 mV
Lead Channel Sensing Intrinsic Amplitude: 2.125 mV
Lead Channel Sensing Intrinsic Amplitude: 2.125 mV
Lead Channel Setting Pacing Amplitude: 2.5 V
MDC IDC MSMT BATTERY VOLTAGE: 2.95 V
MDC IDC MSMT LEADCHNL LV PACING THRESHOLD PULSEWIDTH: 0.4 ms
MDC IDC MSMT LEADCHNL RA IMPEDANCE VALUE: 456 Ohm
MDC IDC MSMT LEADCHNL RA SENSING INTR AMPL: 0.875 mV
MDC IDC MSMT LEADCHNL RV IMPEDANCE VALUE: 323 Ohm
MDC IDC SET LEADCHNL RV PACING PULSEWIDTH: 1 ms
MDC IDC SET LEADCHNL RV SENSING SENSITIVITY: 0.3 mV
MDC IDC SET ZONE DETECTION INTERVAL: 340 ms
MDC IDC SET ZONE DETECTION INTERVAL: 350 ms
MDC IDC STAT BRADY AP VP PERCENT: 0 %
MDC IDC STAT BRADY RV PERCENT PACED: 65.7 %
Zone Setting Detection Interval: 290 ms
Zone Setting Detection Interval: 450 ms

## 2015-03-10 ENCOUNTER — Encounter: Payer: Self-pay | Admitting: Cardiology

## 2015-03-14 ENCOUNTER — Encounter: Payer: Self-pay | Admitting: Internal Medicine

## 2015-05-25 ENCOUNTER — Ambulatory Visit (INDEPENDENT_AMBULATORY_CARE_PROVIDER_SITE_OTHER): Payer: Medicare Other | Admitting: *Deleted

## 2015-05-25 DIAGNOSIS — I255 Ischemic cardiomyopathy: Secondary | ICD-10-CM | POA: Diagnosis not present

## 2015-05-25 DIAGNOSIS — I5022 Chronic systolic (congestive) heart failure: Secondary | ICD-10-CM | POA: Diagnosis not present

## 2015-05-25 DIAGNOSIS — I509 Heart failure, unspecified: Secondary | ICD-10-CM

## 2015-05-25 NOTE — Progress Notes (Signed)
Remote ICD transmission.   

## 2015-05-30 LAB — CUP PACEART REMOTE DEVICE CHECK
Battery Remaining Longevity: 45 mo
Battery Voltage: 2.96 V
Brady Statistic AP VP Percent: 0 %
Brady Statistic AP VS Percent: 0 %
Brady Statistic AS VP Percent: 63.96 %
Brady Statistic AS VS Percent: 36.04 %
Brady Statistic RA Percent Paced: 0 %
Brady Statistic RV Percent Paced: 66.81 %
Date Time Interrogation Session: 20160622041608
HighPow Impedance: 35 Ohm
HighPow Impedance: 43 Ohm
Lead Channel Impedance Value: 323 Ohm
Lead Channel Impedance Value: 342 Ohm
Lead Channel Impedance Value: 399 Ohm
Lead Channel Impedance Value: 437 Ohm
Lead Channel Impedance Value: 494 Ohm
Lead Channel Impedance Value: 665 Ohm
Lead Channel Pacing Threshold Amplitude: 1.875 V
Lead Channel Pacing Threshold Pulse Width: 0.4 ms
Lead Channel Sensing Intrinsic Amplitude: 0.375 mV
Lead Channel Sensing Intrinsic Amplitude: 0.375 mV
Lead Channel Sensing Intrinsic Amplitude: 0.625 mV
Lead Channel Sensing Intrinsic Amplitude: 0.625 mV
Lead Channel Setting Pacing Amplitude: 2.5 V
Lead Channel Setting Pacing Pulse Width: 1 ms
Lead Channel Setting Sensing Sensitivity: 0.3 mV
Zone Setting Detection Interval: 290 ms
Zone Setting Detection Interval: 340 ms
Zone Setting Detection Interval: 350 ms
Zone Setting Detection Interval: 450 ms

## 2015-06-01 ENCOUNTER — Encounter: Payer: Self-pay | Admitting: Cardiology

## 2015-06-13 ENCOUNTER — Encounter: Payer: Self-pay | Admitting: Internal Medicine

## 2015-07-08 ENCOUNTER — Ambulatory Visit (INDEPENDENT_AMBULATORY_CARE_PROVIDER_SITE_OTHER): Payer: Medicare Other | Admitting: Internal Medicine

## 2015-07-08 ENCOUNTER — Encounter: Payer: Self-pay | Admitting: Internal Medicine

## 2015-07-08 VITALS — BP 122/50 | HR 86 | Ht 70.0 in | Wt 209.0 lb

## 2015-07-08 DIAGNOSIS — I255 Ischemic cardiomyopathy: Secondary | ICD-10-CM | POA: Diagnosis not present

## 2015-07-08 DIAGNOSIS — I482 Chronic atrial fibrillation, unspecified: Secondary | ICD-10-CM

## 2015-07-08 DIAGNOSIS — I5022 Chronic systolic (congestive) heart failure: Secondary | ICD-10-CM | POA: Diagnosis not present

## 2015-07-08 LAB — CUP PACEART INCLINIC DEVICE CHECK
Battery Remaining Longevity: 41 mo
Battery Voltage: 2.95 V
Brady Statistic AP VS Percent: 0 %
Brady Statistic AS VP Percent: 63.36 %
Brady Statistic RA Percent Paced: 0 %
Brady Statistic RV Percent Paced: 66.31 %
Date Time Interrogation Session: 20160805134839
HIGH POWER IMPEDANCE MEASURED VALUE: 35 Ohm
HIGH POWER IMPEDANCE MEASURED VALUE: 42 Ohm
HighPow Impedance: 133 Ohm
Lead Channel Impedance Value: 304 Ohm
Lead Channel Impedance Value: 437 Ohm
Lead Channel Impedance Value: 665 Ohm
Lead Channel Pacing Threshold Amplitude: 0.75 V
Lead Channel Pacing Threshold Pulse Width: 0.4 ms
Lead Channel Pacing Threshold Pulse Width: 0.4 ms
Lead Channel Setting Sensing Sensitivity: 0.3 mV
MDC IDC MSMT LEADCHNL LV IMPEDANCE VALUE: 494 Ohm
MDC IDC MSMT LEADCHNL LV PACING THRESHOLD AMPLITUDE: 1.875 V
MDC IDC MSMT LEADCHNL RA IMPEDANCE VALUE: 437 Ohm
MDC IDC MSMT LEADCHNL RV SENSING INTR AMPL: 2.125 mV
MDC IDC SET LEADCHNL RV PACING AMPLITUDE: 2.5 V
MDC IDC SET LEADCHNL RV PACING PULSEWIDTH: 1 ms
MDC IDC SET ZONE DETECTION INTERVAL: 290 ms
MDC IDC STAT BRADY AP VP PERCENT: 0 %
MDC IDC STAT BRADY AS VS PERCENT: 36.64 %
Zone Setting Detection Interval: 340 ms
Zone Setting Detection Interval: 350 ms
Zone Setting Detection Interval: 450 ms

## 2015-07-08 NOTE — Patient Instructions (Signed)
Your physician recommends that you continue on your current medications as directed. Please refer to the Current Medication list given to you today. Carelink device check on 10/10/15. Your physician recommends that you schedule a follow-up appointment in: 1 year. You will receive a reminder letter in the mail in about 10 months reminding you to call and schedule your appointment. If you don't receive this letter, please contact our office.

## 2015-07-08 NOTE — Progress Notes (Signed)
PCP:  Ignatius Specking., MD Primary Cardiologist;  Dr Gala Romney, also followed by the [facility] transplant team  The patient presents today for electrophysiology followup. Since his last visit, he has done reasonably well.  He did not have lead revision, though his RV threshold is chronically elevated and he has diaphragmatic stim with LV pacing.  His LV lead was turned off at [facility] previously.   Today, he denies symptoms of palpitations, chest pain, shortness of breath, orthopnea, PND, dizziness, presyncope, syncope, or neurologic sequela.  The patient feels that he is tolerating medications without difficulties and is otherwise without complaint today.   Past Medical History  Diagnosis Date  . Diabetes mellitus   . 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 CAD (coronary artery disease)   . Gout   . Hypotension     ORTHOSTATIC  . DVT of lower extremity (deep venous thrombosis)     left  . Hypertension   . High cholesterol   . Pulmonary embolism on left 1988  . Myocardial infarction 1988  . Angina   . CHF (congestive heart failure)   . Heart murmur   . Mitral valve disorder   . Pneumonia 1993; 2009    "both serious cases"  . Pneumonia 2010    "not hospitalized"  . Shortness of breath on exertion   . Sleep apnea   . Blood transfusion   . Anemia   . H/O hiatal hernia   . Stomach ulcer 1987-1988  . Jaundice     "@ birth & when I was in 7th grade"  . 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 Biv  11/2010    ICD 12/11 MEDTRONIC  . Cataract extraction w/ intraocular lens  implant, bilateral  1990's  . Exploratory laparotomy w/ bowel resection  1993  . Tracheostomy  1993  . Coronary angioplasty  1988  . Coronary angioplasty with stent placement  2003    "1"  . Cardiac catheterization  12/13/11  . Leff ventricular assist device (lvad)  05/02/12    LVAD [facility] at [facility]  . Implantable  cardioverter defibrillator generator change  08/19/13    MDT Coletta Memos CRT-D generator change by Dr Wilford Grist at [facility].  RV lead with chronically elevated threshold and low R waves not revised.  LV lead with chronic diaphrgramtic stim turned off at [facility].  . Right heart catheterization N/A 12/13/2011    Procedure: RIGHT HEART CATH;  Surgeon: Dolores Patty, MD;  Location: [facility] CATH LAB;  Service: Cardiovascular;  Laterality: N/A;  . Right heart catheterization N/A 02/08/2012    Procedure: RIGHT HEART CATH;  Surgeon: Dolores Patty, MD;  Location: [facility] CATH LAB;  Service: Cardiovascular;  Laterality: N/A;    Current Outpatient Prescriptions  Medication Sig Dispense Refill  . acetaminophen (TYLENOL) 500 MG tablet Take 500 mg by mouth every 6 (six) hours as needed.    [name] albuterol (PROVENTIL HFA;VENTOLIN HFA) 108 (90 BASE) MCG/ACT inhaler Inhale 1-2 puffs into the lungs as needed for wheezing or shortness of breath.    . budesonide-formoterol (SYMBICORT) 160-4.5 MCG/ACT inhaler Inhale 2 puffs into the lungs daily as needed.    . calcium carbonate (OS-CAL) 600 MG TABS tablet Take 600 mg by mouth 2 (two) times daily.    . Cholecalciferol (VITAMIN D3) 2000 UNITS TABS Take 1 tablet by mouth daily.    [name]  colchicine 0.6 MG tablet Take 0.6 mg by mouth daily.    . ferrous sulfate 324 (65 FE) MG TBEC Take 1 tablet by mouth daily.    [name] gabapentin (NEURONTIN) 300 MG capsule Take 1 capsule by mouth 3 (three) times daily.    [name] HYDROcodone-acetaminophen (NORCO/VICODIN) 5-325 MG per tablet Take 1 tablet by mouth 3 (three) times daily as needed for moderate pain.    . magnesium oxide (MAG-OX) 400 MG tablet Take 400 mg by mouth 2 (two) times daily.    . Multiple Vitamin (MULTIVITAMIN) tablet Take 1 tablet by mouth daily.    [name] omeprazole (PRILOSEC) 40 MG capsule Take 40 mg by mouth 2 (two) times daily.    . potassium chloride SA (K-DUR,KLOR-CON) 20 MEQ tablet Take 20 mEq by mouth 2 (two) times daily.     . pravastatin  (PRAVACHOL) 40 MG tablet Take one by mouth daily    . temazepam (RESTORIL) 15 MG capsule Take 1 capsule by mouth at bedtime as needed.    . torsemide (DEMADEX) 20 MG tablet Take 20 mg by mouth every morning.    . warfarin (COUMADIN) 1 MG tablet Take 1 mg by mouth daily. As directed per [facility]    . warfarin (COUMADIN) 2 MG tablet Take 2-4 mg by mouth as directed. Managed by [facility] Patient checks at home     No current facility-administered medications for this visit.    Allergies  Allergen Reactions  . Levofloxacin Other (See Comments)    Doesn't tolerate  . Sulfonamide Derivatives Other (See Comments)    unknown  . Cefuroxime Rash  . Hydralazine Rash    History   Social History  . Marital Status: Married    Spouse Name: N/A  . Number of Children: N/A  . Years of Education: N/A   Occupational History  . Not on file.   Social History Main Topics  . Smoking status: Former Smoker -- 1.00 packs/day for 30 years    Types: Cigarettes    Quit date: 12/03/1986  . Smokeless tobacco: Never Used  . Alcohol Use: No  . Drug Use: No  . Sexual Activity: No   Other Topics Concern  . Not on file   Social History Narrative    Physical Exam: Filed Vitals:   07/08/15 0943  BP: 122/50  Pulse: 86  Height: 5\' 10"  (1.778 m)  Weight: 94.802 kg (209 lb)  SpO2: 98%    GEN- The patient is well appearing, alert and oriented x 3 today.  He has an LVAD Head- normocephalic, atraumatic Eyes-  Sclera clear, conjunctiva pink Ears- hearing intact Oropharynx- clear Neck- supple,  Lungs- Clear to ausculation bilaterally, normal work of breathing Chest- ICD pocket is well healed Heart- LVAD hum is prominent GI- soft, NT, ND, + BS Extremities- no clubbing, cyanosis, or edema  ICD interrogation- reviewed in detail today,  See PACEART report  Assessment and Plan:  1. BIV ICD lead issue LV lead was turned off at [facility] for diaphragmatic stim.  His RV lead threshold is chronically elevated and  R waves are low but was not revised at [facility].   He is RV paced 60% but appears to tolerate this ok.   No changes today   2. Chronic systolic dysfunction Clinically much improved s/p LVAD placement  He will continue to follow closely at [facility] and with the [facility] CHF clinic   3. Afib permanent Continue long term anticoagulation  He will return to see me in  12 months in [facility]

## 2015-07-12 ENCOUNTER — Telehealth (HOSPITAL_COMMUNITY): Payer: Self-pay | Admitting: Infectious Diseases

## 2015-07-12 NOTE — Telephone Encounter (Signed)
Called patient re: VAD Clinic F/U with [facility] Team. He has historically been a shared care patient with [facility] where he would alternate seeing each place q36m. He has not been seen since 10:2015 for our clinic. CareEverywhere reviewed and looks as if he was hospitalized in March 2016 recently and was seen 06/2015 at [facility] VAD Team. He stated that he would check with [facility]'s team to see if they still wanted him to be seen with our clinic and would defer scheduling F/U appt at this time and call back.

## 2015-08-17 DIAGNOSIS — W19XXXA Unspecified fall, initial encounter: Secondary | ICD-10-CM | POA: Insufficient documentation

## 2015-09-01 DIAGNOSIS — K8689 Other specified diseases of pancreas: Secondary | ICD-10-CM | POA: Insufficient documentation

## 2015-10-10 ENCOUNTER — Ambulatory Visit (INDEPENDENT_AMBULATORY_CARE_PROVIDER_SITE_OTHER): Payer: Medicare Other | Admitting: *Deleted

## 2015-10-10 DIAGNOSIS — I255 Ischemic cardiomyopathy: Secondary | ICD-10-CM | POA: Diagnosis not present

## 2015-10-10 DIAGNOSIS — I5022 Chronic systolic (congestive) heart failure: Secondary | ICD-10-CM | POA: Diagnosis not present

## 2015-10-10 NOTE — Progress Notes (Signed)
Remote ICD transmission.   

## 2015-10-11 ENCOUNTER — Encounter: Payer: Self-pay | Admitting: Cardiology

## 2015-10-11 LAB — CUP PACEART REMOTE DEVICE CHECK
Battery Voltage: 2.95 V
Brady Statistic AP VP Percent: 0 %
Brady Statistic AP VS Percent: 0 %
Brady Statistic AS VP Percent: 62.56 %
Brady Statistic AS VS Percent: 37.44 %
Brady Statistic RV Percent Paced: 65.62 %
HIGH POWER IMPEDANCE MEASURED VALUE: 34 Ohm
HIGH POWER IMPEDANCE MEASURED VALUE: 41 Ohm
Implantable Lead Implant Date: 20111227
Implantable Lead Implant Date: 20111227
Implantable Lead Location: 753858
Implantable Lead Location: 753859
Lead Channel Impedance Value: 304 Ohm
Lead Channel Impedance Value: 399 Ohm
Lead Channel Impedance Value: 437 Ohm
Lead Channel Impedance Value: 494 Ohm
Lead Channel Impedance Value: 646 Ohm
Lead Channel Pacing Threshold Amplitude: 1.875 V
Lead Channel Pacing Threshold Pulse Width: 0.4 ms
Lead Channel Sensing Intrinsic Amplitude: 0.25 mV
Lead Channel Sensing Intrinsic Amplitude: 1.875 mV
Lead Channel Setting Pacing Amplitude: 2.5 V
Lead Channel Setting Pacing Pulse Width: 1 ms
Lead Channel Setting Sensing Sensitivity: 0.3 mV
MDC IDC LEAD IMPLANT DT: 20111227
MDC IDC LEAD LOCATION: 753860
MDC IDC LEAD MODEL: 4194
MDC IDC MSMT BATTERY REMAINING LONGEVITY: 33 mo
MDC IDC MSMT LEADCHNL RV IMPEDANCE VALUE: 456 Ohm
MDC IDC SESS DTM: 20161107062306
MDC IDC STAT BRADY RA PERCENT PACED: 0 %

## 2016-01-02 ENCOUNTER — Telehealth: Payer: Self-pay | Admitting: Internal Medicine

## 2016-01-02 NOTE — Telephone Encounter (Signed)
Patient received Medtronic machine and has no idea how to operate. He will not be home until later this afternoon after 3pm

## 2016-01-09 ENCOUNTER — Encounter: Payer: Medicare Other | Admitting: *Deleted

## 2016-01-09 ENCOUNTER — Telehealth: Payer: Self-pay | Admitting: Cardiology

## 2016-01-09 NOTE — Telephone Encounter (Signed)
LMOVM reminding pt to send remote transmission.   

## 2016-01-10 ENCOUNTER — Encounter: Payer: Self-pay | Admitting: Cardiology

## 2016-01-18 ENCOUNTER — Ambulatory Visit (INDEPENDENT_AMBULATORY_CARE_PROVIDER_SITE_OTHER): Payer: Medicare Other | Admitting: *Deleted

## 2016-01-18 DIAGNOSIS — I255 Ischemic cardiomyopathy: Secondary | ICD-10-CM | POA: Diagnosis not present

## 2016-01-18 DIAGNOSIS — I5022 Chronic systolic (congestive) heart failure: Secondary | ICD-10-CM

## 2016-01-19 NOTE — Progress Notes (Signed)
Remote ICD transmission.   

## 2016-01-23 LAB — CUP PACEART REMOTE DEVICE CHECK
Battery Remaining Longevity: 26 mo
Battery Voltage: 2.93 V
Brady Statistic AP VS Percent: 0 %
Brady Statistic AS VP Percent: 62.53 %
Brady Statistic AS VS Percent: 37.47 %
Brady Statistic RA Percent Paced: 0 %
Brady Statistic RV Percent Paced: 65.79 %
Date Time Interrogation Session: 20170215210328
HighPow Impedance: 36 Ohm
HighPow Impedance: 44 Ohm
Implantable Lead Location: 753858
Implantable Lead Location: 753859
Implantable Lead Location: 753860
Implantable Lead Model: 4076
Implantable Lead Model: 4194
Lead Channel Impedance Value: 399 Ohm
Lead Channel Impedance Value: 513 Ohm
Lead Channel Pacing Threshold Amplitude: 1.875 V
Lead Channel Sensing Intrinsic Amplitude: 1.375 mV
Lead Channel Sensing Intrinsic Amplitude: 2.25 mV
Lead Channel Sensing Intrinsic Amplitude: 2.25 mV
Lead Channel Setting Pacing Pulse Width: 1 ms
Lead Channel Setting Sensing Sensitivity: 0.3 mV
MDC IDC LEAD IMPLANT DT: 20111227
MDC IDC LEAD IMPLANT DT: 20111227
MDC IDC LEAD IMPLANT DT: 20111227
MDC IDC MSMT LEADCHNL LV IMPEDANCE VALUE: 304 Ohm
MDC IDC MSMT LEADCHNL LV IMPEDANCE VALUE: 703 Ohm
MDC IDC MSMT LEADCHNL LV PACING THRESHOLD PULSEWIDTH: 0.4 ms
MDC IDC MSMT LEADCHNL RA IMPEDANCE VALUE: 456 Ohm
MDC IDC MSMT LEADCHNL RA SENSING INTR AMPL: 1.375 mV
MDC IDC MSMT LEADCHNL RV IMPEDANCE VALUE: 494 Ohm
MDC IDC SET LEADCHNL RV PACING AMPLITUDE: 2.5 V
MDC IDC STAT BRADY AP VP PERCENT: 0 %

## 2016-01-27 ENCOUNTER — Encounter: Payer: Self-pay | Admitting: Cardiology

## 2016-07-06 ENCOUNTER — Encounter: Payer: Self-pay | Admitting: Internal Medicine

## 2016-07-06 ENCOUNTER — Ambulatory Visit (INDEPENDENT_AMBULATORY_CARE_PROVIDER_SITE_OTHER): Payer: Medicare Other | Admitting: Internal Medicine

## 2016-07-06 VITALS — BP 108/67 | HR 79 | Ht 70.0 in | Wt 215.8 lb

## 2016-07-06 DIAGNOSIS — I482 Chronic atrial fibrillation, unspecified: Secondary | ICD-10-CM

## 2016-07-06 DIAGNOSIS — I5022 Chronic systolic (congestive) heart failure: Secondary | ICD-10-CM | POA: Diagnosis not present

## 2016-07-06 DIAGNOSIS — I509 Heart failure, unspecified: Secondary | ICD-10-CM | POA: Diagnosis not present

## 2016-07-06 DIAGNOSIS — I255 Ischemic cardiomyopathy: Secondary | ICD-10-CM

## 2016-07-06 DIAGNOSIS — I4891 Unspecified atrial fibrillation: Secondary | ICD-10-CM

## 2016-07-06 LAB — CUP PACEART INCLINIC DEVICE CHECK
Brady Statistic AP VS Percent: 0 %
Brady Statistic AS VP Percent: 62.5 %
Brady Statistic AS VS Percent: 37.5 %
Brady Statistic RA Percent Paced: 0 %
Brady Statistic RV Percent Paced: 65.69 %
Date Time Interrogation Session: 20170804104558
HIGH POWER IMPEDANCE MEASURED VALUE: 34 Ohm
HighPow Impedance: 44 Ohm
Implantable Lead Implant Date: 20111227
Implantable Lead Location: 753858
Implantable Lead Model: 6947
Lead Channel Impedance Value: 304 Ohm
Lead Channel Impedance Value: 437 Ohm
Lead Channel Impedance Value: 494 Ohm
Lead Channel Impedance Value: 513 Ohm
Lead Channel Impedance Value: 532 Ohm
Lead Channel Impedance Value: 703 Ohm
Lead Channel Pacing Threshold Amplitude: 1.75 V
Lead Channel Pacing Threshold Pulse Width: 0.4 ms
Lead Channel Sensing Intrinsic Amplitude: 0.25 mV
Lead Channel Sensing Intrinsic Amplitude: 0.25 mV
MDC IDC LEAD IMPLANT DT: 20111227
MDC IDC LEAD IMPLANT DT: 20111227
MDC IDC LEAD LOCATION: 753859
MDC IDC LEAD LOCATION: 753860
MDC IDC LEAD MODEL: 4194
MDC IDC MSMT BATTERY REMAINING LONGEVITY: 22 mo
MDC IDC MSMT BATTERY VOLTAGE: 2.93 V
MDC IDC MSMT LEADCHNL RV PACING THRESHOLD AMPLITUDE: 1 V
MDC IDC MSMT LEADCHNL RV PACING THRESHOLD PULSEWIDTH: 1 ms
MDC IDC MSMT LEADCHNL RV SENSING INTR AMPL: 2 mV
MDC IDC MSMT LEADCHNL RV SENSING INTR AMPL: 2.125 mV
MDC IDC SET LEADCHNL RV PACING AMPLITUDE: 2.5 V
MDC IDC SET LEADCHNL RV PACING PULSEWIDTH: 1 ms
MDC IDC SET LEADCHNL RV SENSING SENSITIVITY: 0.3 mV
MDC IDC STAT BRADY AP VP PERCENT: 0 %

## 2016-07-06 NOTE — Patient Instructions (Addendum)
Medication Instructions:   Your physician recommends that you continue on your current medications as directed. Please refer to the Current Medication list given to you today.  Labwork: NONE  Testing/Procedures: NONE  Follow-Up:  Your next device check from home is on 10/08/16. Your physician recommends that you schedule a follow-up appointment in: 1 year with Dr. Johney Frame. Please schedule this appointment today before leaving the office.   Any Other Special Instructions Will Be Listed Below (If Applicable).  You are being enrolled in the [facility] clinic. You will be contacted by Randon Goldsmith RN.  You have been referred to Dr. Gala Romney in the heart failure clinic.  If you need a refill on your cardiac medications before your next appointment, please call your pharmacy.

## 2016-07-08 NOTE — Progress Notes (Signed)
PCP:  Ignatius Specking, MD Primary Cardiologist;  Dr Gala Romney previously, also followed by the [facility] transplant team  The patient presents today for electrophysiology followup. Since his last visit, he has done reasonably well.   Today, he denies symptoms of palpitations, chest pain, shortness of breath, orthopnea, PND, dizziness, presyncope, syncope, or neurologic sequela.  The patient feels that he is tolerating medications without difficulties and is otherwise without complaint today.   Past Medical History:  Diagnosis Date  . Anemia   . Angina   . Blood transfusion   . CAD (coronary artery disease)   . CHF (congestive heart failure) (HCC)   . Diabetes mellitus   . DVT of lower extremity (deep venous thrombosis) (HCC)    left  . Gout   . H/O hiatal hernia   . Heart murmur   . High cholesterol   . Hypertension   . Hypotension    ORTHOSTATIC  . Jaundice    "@ birth & when I was in 7th grade"  . Mitral valve disorder   . Myocardial infarction (HCC) 1988  . Pneumonia 1993; 2009   "both serious cases"  . Pneumonia 2010   "not hospitalized"  . Pulmonary embolism on left (HCC) 1988  . 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 Shortness of breath on exertion   . Sleep apnea   . Stomach ulcer 1987-1988  . Stroke [facility]) 1987   "brain stem; skin sensitive since then; once in awhile slur"12/13/11)   Past Surgical History:  Procedure Laterality Date  . BIV  11/2010   ICD 12/11 MEDTRONIC  . CARDIAC CATHETERIZATION  12/13/11  . CATARACT EXTRACTION W/ INTRAOCULAR LENS  IMPLANT, BILATERAL  1990's  . CORONARY ANGIOPLASTY  1988  . CORONARY ANGIOPLASTY WITH STENT PLACEMENT  2003   "1"  . EXPLORATORY LAPAROTOMY W/ BOWEL RESECTION  1993  . IMPLANTABLE CARDIOVERTER DEFIBRILLATOR GENERATOR CHANGE  08/19/13   MDT Coletta Memos CRT-D generator change by Dr Wilford Grist at [facility].  RV lead with chronically elevated threshold and low R waves not revised.  LV lead with chronic diaphrgramtic stim turned off  at [facility].  . leff ventricular assist device (LVAD)  05/02/12   LVAD implanted at [facility]  . RIGHT HEART CATHETERIZATION N/A 12/13/2011   Procedure: RIGHT HEART CATH;  Surgeon: Dolores Patty, MD;  Location: [facility] CATH LAB;  Service: Cardiovascular;  Laterality: N/A;  . RIGHT HEART CATHETERIZATION N/A 02/08/2012   Procedure: RIGHT HEART CATH;  Surgeon: Dolores Patty, MD;  Location: [facility] CATH LAB;  Service: Cardiovascular;  Laterality: N/A;  . TRACHEOSTOMY  1993    Current Outpatient Prescriptions  Medication Sig Dispense Refill  . acetaminophen (TYLENOL) 500 MG tablet Take 500 mg by mouth every 6 (six) hours as needed.    [name] albuterol (PROVENTIL HFA;VENTOLIN HFA) 108 (90 BASE) MCG/ACT inhaler Inhale 1-2 puffs into the lungs as needed for wheezing or shortness of breath.    . budesonide-formoterol (SYMBICORT) 160-4.5 MCG/ACT inhaler Inhale 2 puffs into the lungs daily as needed.    . calcium carbonate (OS-CAL) 600 MG TABS tablet Take 600 mg by mouth 2 (two) times daily.    . Cholecalciferol (VITAMIN D3) 2000 UNITS TABS Take 1 tablet by mouth daily.    . colchicine 0.6 MG tablet Take 0.6 mg by mouth daily.    . ferrous sulfate 324 (65 FE) MG TBEC Take 1 tablet by mouth daily.    [name] gabapentin (NEURONTIN) 300  MG capsule Take 2 capsules by mouth 3 (three) times daily.     . magnesium oxide (MAG-OX) 400 MG tablet Take 400 mg by mouth 2 (two) times daily.    . Multiple Vitamin (MULTIVITAMIN) tablet Take 1 tablet by mouth daily.    [name] omeprazole (PRILOSEC) 40 MG capsule Take 40 mg by mouth 2 (two) times daily.    . potassium chloride SA (K-DUR,KLOR-CON) 20 MEQ tablet Take 20 mEq by mouth 2 (two) times daily.     . pravastatin (PRAVACHOL) 40 MG tablet Take one by mouth daily    . torsemide (DEMADEX) 20 MG tablet Take 20 mg by mouth every morning. May take an extra tab in the evening as needed.    . traMADol (ULTRAM) 50 MG tablet Take 50 mg by mouth as needed.    . warfarin (COUMADIN) 1 MG tablet Take 1 mg  by mouth daily. As directed per [facility]    . warfarin (COUMADIN) 2 MG tablet Take 2-4 mg by mouth as directed. Managed by [facility] Patient checks at home     No current facility-administered medications for this visit.     Allergies  Allergen Reactions  . Levofloxacin Other (See Comments)    Doesn't tolerate  . Sulfonamide Derivatives Other (See Comments)    unknown  . Cefuroxime Rash  . Hydralazine Rash    Social History   Social History  . Marital status: Married    Spouse name: N/A  . Number of children: N/A  . Years of education: N/A   Occupational History  . Not on file.   Social History Main Topics  . Smoking status: Former Smoker    Packs/day: 1.00    Years: 30.00    Types: Cigarettes    Quit date: 12/03/1986  . Smokeless tobacco: Never Used  . Alcohol use No  . Drug use: No  . Sexual activity: No   Other Topics Concern  . Not on file   Social History Narrative  . No narrative on file    Physical Exam: Vitals:   07/06/16 0927  BP: 108/67  Pulse: 79  SpO2: 100%  Weight: 215 lb 12.8 oz (97.9 kg)  Height:  (1.778 m)    GEN- The patient is well appearing, alert and oriented x 3 today.  He has an LVAD Head- normocephalic, atraumatic Eyes-  Sclera clear, conjunctiva pink Ears- hearing intact Oropharynx- clear Neck- supple,  Lungs- Clear to ausculation bilaterally, normal work of breathing Chest- ICD pocket is well healed Heart- LVAD hum is prominent GI- soft, NT, ND, + BS Extremities- no clubbing, cyanosis, or edema  ICD interrogation- reviewed in detail today,  See PACEART report  Assessment and Plan:  1. BIV ICD lead issue LV lead was turned off at [facility] for diaphragmatic stim.  His RV lead threshold is chronically elevated and R waves are low but was not revised at [facility].   He remains RV paced 60% but appears to tolerate this ok.   No changes today.    2. Chronic systolic dysfunction Clinically much improved s/p LVAD placement  He will  continue to follow closely at [facility].  He has not seen Dr Gala Romney in several years.  He would like to re-establish.  We will refer back to CHF clinic.  3. Afib permanent Continue long term anticoagulation  He will return to see me in 12 months in [place] Will enroll in [facility] device clinic with Randon Goldsmith  Hillis Range MD, [facility]  07/08/2016 9:30 AM

## 2016-07-13 NOTE — Progress Notes (Signed)
Referred to ICM clinic by Dr Johney Frame.  Spoke with patient and explained ICM program.  He agreed to monthly ICM calls.  1st ICM remote transmission scheduled for 08/13/2016.  He has appointment with HF clinic 07/17/2016.

## 2016-07-17 ENCOUNTER — Other Ambulatory Visit (HOSPITAL_COMMUNITY): Payer: Self-pay | Admitting: Infectious Diseases

## 2016-07-17 ENCOUNTER — Ambulatory Visit (HOSPITAL_COMMUNITY)
Admission: RE | Admit: 2016-07-17 | Discharge: 2016-07-17 | Disposition: A | Discharge status: Home / Self Care | Payer: Medicare Other | Source: Ambulatory Visit | Attending: Cardiology | Admitting: Cardiology

## 2016-07-17 VITALS — BP 90/0 | Wt 205.2 lb

## 2016-07-17 DIAGNOSIS — Z95811 Presence of heart assist device: Secondary | ICD-10-CM

## 2016-07-17 DIAGNOSIS — I2510 Atherosclerotic heart disease of native coronary artery without angina pectoris: Secondary | ICD-10-CM | POA: Insufficient documentation

## 2016-07-17 DIAGNOSIS — Z7901 Long term (current) use of anticoagulants: Secondary | ICD-10-CM

## 2016-07-17 DIAGNOSIS — M109 Gout, unspecified: Secondary | ICD-10-CM | POA: Diagnosis not present

## 2016-07-17 DIAGNOSIS — Z86711 Personal history of pulmonary embolism: Secondary | ICD-10-CM | POA: Insufficient documentation

## 2016-07-17 DIAGNOSIS — I130 Hypertensive heart and chronic kidney disease with heart failure and stage 1 through stage 4 chronic kidney disease, or unspecified chronic kidney disease: Secondary | ICD-10-CM | POA: Diagnosis not present

## 2016-07-17 DIAGNOSIS — I482 Chronic atrial fibrillation, unspecified: Secondary | ICD-10-CM

## 2016-07-17 DIAGNOSIS — D649 Anemia, unspecified: Secondary | ICD-10-CM | POA: Diagnosis not present

## 2016-07-17 DIAGNOSIS — Z95810 Presence of automatic (implantable) cardiac defibrillator: Secondary | ICD-10-CM | POA: Diagnosis not present

## 2016-07-17 DIAGNOSIS — E1122 Type 2 diabetes mellitus with diabetic chronic kidney disease: Secondary | ICD-10-CM | POA: Insufficient documentation

## 2016-07-17 DIAGNOSIS — I5022 Chronic systolic (congestive) heart failure: Secondary | ICD-10-CM | POA: Diagnosis not present

## 2016-07-17 DIAGNOSIS — Z79899 Other long term (current) drug therapy: Secondary | ICD-10-CM | POA: Diagnosis not present

## 2016-07-17 DIAGNOSIS — Z8711 Personal history of peptic ulcer disease: Secondary | ICD-10-CM | POA: Insufficient documentation

## 2016-07-17 DIAGNOSIS — I252 Old myocardial infarction: Secondary | ICD-10-CM | POA: Insufficient documentation

## 2016-07-17 DIAGNOSIS — G4730 Sleep apnea, unspecified: Secondary | ICD-10-CM | POA: Diagnosis not present

## 2016-07-17 DIAGNOSIS — E7800 Pure hypercholesterolemia, unspecified: Secondary | ICD-10-CM | POA: Diagnosis not present

## 2016-07-17 DIAGNOSIS — Z86718 Personal history of other venous thrombosis and embolism: Secondary | ICD-10-CM | POA: Diagnosis not present

## 2016-07-17 DIAGNOSIS — R5382 Chronic fatigue, unspecified: Secondary | ICD-10-CM

## 2016-07-17 DIAGNOSIS — I255 Ischemic cardiomyopathy: Secondary | ICD-10-CM | POA: Diagnosis not present

## 2016-07-17 DIAGNOSIS — Z8673 Personal history of transient ischemic attack (TIA), and cerebral infarction without residual deficits: Secondary | ICD-10-CM | POA: Diagnosis not present

## 2016-07-17 DIAGNOSIS — Z8719 Personal history of other diseases of the digestive system: Secondary | ICD-10-CM | POA: Diagnosis not present

## 2016-07-17 DIAGNOSIS — I159 Secondary hypertension, unspecified: Secondary | ICD-10-CM

## 2016-07-17 DIAGNOSIS — N189 Chronic kidney disease, unspecified: Secondary | ICD-10-CM | POA: Diagnosis not present

## 2016-07-17 DIAGNOSIS — Z7951 Long term (current) use of inhaled steroids: Secondary | ICD-10-CM | POA: Insufficient documentation

## 2016-07-17 LAB — MAGNESIUM: MAGNESIUM: 2.4 mg/dL (ref 1.7–2.4)

## 2016-07-17 LAB — COMPREHENSIVE METABOLIC PANEL
ALBUMIN: 3.4 g/dL — AB (ref 3.5–5.0)
ALT: 26 U/L (ref 17–63)
ANION GAP: 10 (ref 5–15)
AST: 28 U/L (ref 15–41)
Alkaline Phosphatase: 68 U/L (ref 38–126)
BILIRUBIN TOTAL: 1 mg/dL (ref 0.3–1.2)
BUN: 52 mg/dL — ABNORMAL HIGH (ref 6–20)
CHLORIDE: 104 mmol/L (ref 101–111)
CO2: 27 mmol/L (ref 22–32)
Calcium: 8.8 mg/dL — ABNORMAL LOW (ref 8.9–10.3)
Creatinine, Ser: 1.91 mg/dL — ABNORMAL HIGH (ref 0.61–1.24)
GFR calc Af Amer: 37 mL/min — ABNORMAL LOW (ref >60)
GFR calc non Af Amer: 32 mL/min — ABNORMAL LOW (ref >60)
GLUCOSE: 153 mg/dL — AB (ref 65–99)
POTASSIUM: 4.9 mmol/L (ref 3.5–5.1)
SODIUM: 141 mmol/L (ref 135–145)
TOTAL PROTEIN: 6.3 g/dL — AB (ref 6.5–8.1)

## 2016-07-17 LAB — CBC
HEMATOCRIT: 34.2 % — AB (ref 39.0–52.0)
HEMOGLOBIN: 9.2 g/dL — AB (ref 13.0–17.0)
MCH: 24.3 pg — AB (ref 26.0–34.0)
MCHC: 26.9 g/dL — ABNORMAL LOW (ref 30.0–36.0)
MCV: 90.5 fL (ref 78.0–100.0)
Platelets: 296 10*3/uL (ref 150–400)
RBC: 3.78 MIL/uL — AB (ref 4.22–5.81)
RDW: 20.1 % — ABNORMAL HIGH (ref 11.5–15.5)
WBC: 8.1 10*3/uL (ref 4.0–10.5)

## 2016-07-17 LAB — LACTATE DEHYDROGENASE: LDH: 187 U/L (ref 98–192)

## 2016-07-17 LAB — PROTIME-INR
INR: 1.7
PROTHROMBIN TIME: 20.2 s — AB (ref 11.4–15.2)

## 2016-07-17 NOTE — Progress Notes (Signed)
Symptom Yes No Details  Angina  x Activity:  Claudication  x How far:  Syncope  x When:  Stroke x  CVA in 1987. Slight Right decreased sensation  Orthopnea  x How many pillows: 1 pillow  PND  x How often:   CPAP  x How many hrs: used to but not wearing it now  Pedal edema x  slight  Abd fullness  x   N&V  x   Diaphoresis  x When:  Bleeding x  On PO Iron QD. No BRBPR. Reports intermittent darker BMs.   Urine  x Clear yellow  SOB x  Activity:  Palpitations  x When:  ICD shock  x   Hospitlizaitons x  When/where/why: [facility] recently for GI bleeding. Work up was negative for any active bleeding or sites they could intervene upon.   ED visit  x When/where/why:  Other MD x  When/who/why: [facility] for weakness and anemia   Activity  x Walks with a cane. Falls increased recently at home. No injury reported   Fluid   "A little more than I should"  Diet      Vital Signs: Doppler BP: 90 Automatic BP: not done HR: 83 SPO2: 94 - 98% on RA  Weight: 205.2 lbs (home weight) "up a couple pounds" usually ~202 lbs Last weight:    VAD interrogation & Equipment Management: Speed: 9200 Flow: 4.9 Power: 5.5w PI: 7.3  Alarms: low voltage advisories Events: rare  Fixed speed: 9200 Low speed limit: 8800  Primary Controller:  Replace back up battery in  20 months. Back up controller:   Replace back up battery in  20 months.  Annual Equipment Maintenance on UBC/PM [facility].   I reviewed the LVAD parameters from today and compared the results to the patient's prior recorded data.  LVAD interrogation was NEGATIVE for significant power changes, NEGATIVE for clinical alarms and STABLE for PI events/speed drops. No programming changes were made and pump is functioning within specified parameters.   VAD coordinator reviewed daily log from home for daily temperature, weight, and VAD parameters. Pt is performing daily controller and system monitor self tests along  with completing weekly and monthly maintenance for LVAD equipment.  LVAD equipment check completed and is in good working order. Back-up equipment present. LVAD education done on emergency procedures and precautions and reviewed exit site care.   Exit Site Care: Drive line is being maintained weekly by his wife Johnny BridgeMartha. No concerns aside from some bruising. Requested me to look at the site today - skin without lesions and no drainage at exit site. Small telangiectasia noted under dressing which she described to be a bruise. Continue weekly care as they are doing.   Pt states they have adequate dressing supplies at home and were not provided with any at his visit today.  Encounter Details: Patient presents for OV with [facility] VAD team to re-initiate shared care status. [facility]. Reports worsening SOB, fatigue and weakness lately. Many falls at home per wife's report. He attempts to exercise a few days a week at a local supervised gym as he is able with limitations. No injury aside from some bruising to backside. Has had several bouts of PNA and with a chronic cough - sees Pul[facility]ology at [facility]. Other complaints are joint pain of which he sees Rheumatology for gouty arthritis. Allergy list updated to reflect the numerous drug interactions.  Concerned about GI bleeding and continuing drops in hemoglobin. Hemoglobin 8.2 on 07/06/16 and was transfused 2 u PRBCs. Reports intermittent darker stools - is taking stool softener w/ Iron QD.    Labs:  INR 1.7 today. Anticoagulation management includes INR goal of 1.7 - 2.3 with no ASA d/t GIB. Results forwarded to [facility] VAD Coordinator Jean Rosenthal.   LDH stable at 187 upon review from [facility] records. Denies tea-colored urine.   Hgb 9.2  today. Was 8.2 last week and received 2 units of PRBCs. No S/S of bleeding noted and specifically denies BRBPR - from report sounds as if he has darker stools intermittently.   Creatinine 1.91 -- baseline  1.7 - 2.0 at [facility].   MAPs 90 today on current regimen.   Device: Medtronic CRT-D (Allred)  Therapies:  On/Off Last check: Considering generator change out w/o ICD. Pacer dependent for CHB.   Medication Changes at this encounter: none  RTC in 6 months for F/U visit .  Rexene Alberts, RN VAD Coordinator   Office: [phone] 24/7 Emergency VAD Pager: [phone]

## 2016-07-17 NOTE — Progress Notes (Signed)
Patient ID: Annamaria BootsBenny Wilson Wease, male   DOB: 1941/09/07, 75 y.o.   MRN: [id number]  Cardiac Surgeon : Dr Aundria Rudogers  [facility]: Dr Gala RomneyBensimhon EP: Dr Johney FrameAllred.   HPI:  Audelia ActonBenny is a 75 year old male with history of coronary artery disease with prior percutaneous coronary intervention, ischemic cardiomyopathy, chronic systolic heart failure. He had [name] on [facility]2012 ly evaluated on 01/13/14 by Dr. Versie StarksJoseph Rogers. Pt has atrial fibrillation, history of CVA in 1987, hypertension, hyperlipidemia, CKD, NSVT, anemia, and DVT/PE.   [facility], an enteroscopy performed on 07/06/13 revealed a non-bleeding gastric ulcer on antrum. Normal duodenum and jejunum.  Pulmonary consult 12/28/13 with Dr. Steele BergBarkauskas for follow up after hospitalization.  Chest CT revealed peripheral GGOs in upper lobe and peripheral distribution. Infiltrates were not classic for CAP, but subsequent sputum cultures revealed 3+ klebsiella and 1+ other GNR. He was treated with a 14 day course of antibiotics with improvement in symptoms. Recommendations included If cough returns / breathing is worse, f/u with pulmonology and consider chest CT. Planned f/u with pulmonary in 3 months and at that time, will check ambulatory sat (6 min walk vs. Oxygen titration study).   The most recent echocardiogram performed on 09/14/[facility] revealed severe left ventricular dysfunction, mild right ventricular dysfunction, mild LVH, mild aortic and tricuspid regurgitation, and trivial mitral regurgitation.   Pt saw Dr. Johney FrameAllred on 11/12/13 for electrophysiology followup. Per Dr. Johney FrameAllred, since [facility] ICD generator [facility], he has done reasonably well. He did not have lead revision, though his RV threshold is chronically elevated and he has diaphragmatic stim with LV pacing. [facility].   ICD was evaluated on March 09, 2014 and was found to be functioning normally. He is in atrial fibrillation 100% of the time.  RV pacing at 80. No ventricular arrhythmias were noted.   He is on Lasix 60 mg daily and is on warfarin for anticoagulation. At [facility], it was decided not to start carvedilol.   Pt reports [facility] for low Hgb in August 2014, ICD battery change September 2014, low Hgb October 2014, and fall with fractured right femoral neck 11/16/13.   He was [facility] and at that time lasix was stopped and torsemide 40 mg daily was started. Also started on mag ox 400 mg twice a day. Later torsemide was cut back to 20 mg daily and mag oxide was cut back to 400 mg daily due to diarrhea.   He has not been seen over the last 2 years. [facility] has [facility] but would like to [facility] care with [facility] today. He returns for LVAD follow up. Complaining of ongoing fatigue. Last week he received 2 units PRBCs for hgb 8.2. Weight at home trending up from 202-205 pounds. Mild dyspnea with exertion. Denies PND/Orthopnea.  Taking all medications.    Denies LVAD alarms. Denies driveline trauma, erythema or drainage. Denies ICD shocks.  Reports taking Coumadin as prescribed and adherence to anticoagulation based dietary restrictions. Denies bright red blood per rectum or melena, no dark urine or hematuria.       Past Medical History:  Diagnosis Date  . Anemia   . Angina   . Blood transfusion   . CAD (coronary artery disease)   . CHF (congestive heart failure) (HCC)   . Diabetes mellitus   . DVT  of lower extremity (deep venous thrombosis) (HCC)    left  . Gout   . H/O hiatal hernia   . Heart murmur   . High cholesterol   . Hypertension   . Hypotension    ORTHOSTATIC  . Jaundice    "@ birth & when I was in 7th grade"  . Mitral valve disorder   . Myocardial infarction (HCC) 1988  . Pneumonia 1993; 2009   "both serious cases"  . Pneumonia 2010   "not hospitalized"  . Pulmonary embolism on left (HCC) 1988  . 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 Shortness of breath on exertion   . Sleep apnea   . Stomach ulcer 1987-1988  . Stroke [facility]) 1987   "brain stem; skin sensitive since then; once in awhile slur"12/13/11)    Current Outpatient Prescriptions  Medication Sig Dispense Refill  . acetaminophen (TYLENOL) 500 MG tablet Take 500 mg by mouth every 6 (six) hours as needed.    [name] albuterol (PROVENTIL HFA;VENTOLIN HFA) 108 (90 BASE) MCG/ACT inhaler Inhale 1-2 puffs into the lungs as needed for wheezing or shortness of breath.    . budesonide-formoterol (SYMBICORT) 160-4.5 MCG/ACT inhaler Inhale 2 puffs into the lungs daily as needed.    . calcium carbonate (OS-CAL) 600 MG TABS tablet Take 600 mg by mouth 2 (two) times daily.    . Cholecalciferol (VITAMIN D3) 2000 UNITS TABS Take 1 tablet by mouth daily.    . colchicine 0.6 MG tablet Take 0.6 mg by mouth daily.    . ferrous sulfate 324 (65 FE) MG TBEC Take 1 tablet by mouth daily.    [name] gabapentin (NEURONTIN) 300 MG capsule Take 2 capsules by mouth 3 (three) times daily.     . magnesium oxide (MAG-OX) 400 MG tablet Take 400 mg by mouth 2 (two) times daily.    . Multiple Vitamin (MULTIVITAMIN) tablet Take 1 tablet by mouth daily.    [name] omeprazole (PRILOSEC) 40 MG capsule Take 40 mg by mouth 2 (two) times daily.    . potassium chloride SA (K-DUR,KLOR-CON) 20 MEQ tablet Take 20 mEq by mouth 2 (two) times daily.     . pravastatin (PRAVACHOL) 40 MG tablet Take one by mouth daily    . torsemide (DEMADEX) 20 MG tablet Take 20 mg by mouth every morning. May take an extra tab in the evening as needed.    . traMADol (ULTRAM) 50 MG tablet Take 50 mg by mouth as needed.    . warfarin (COUMADIN) 1 MG tablet Take 2 mg by mouth daily. As directed per [facility]     . warfarin (COUMADIN) 2 MG tablet Take 2-4 mg by mouth as directed. Managed by [facility] Patient checks at home     No current facility-administered medications for this encounter.     Amoxicillin-pot clavulanate; Levofloxacin; Oxycodone;  Ace inhibitors; Ceftriaxone; Cefuroxime; Chlorhexidine gluconate; Clindamycin/lincomycin; Hydralazine; Meropenem; Sulfonamide derivatives; Vancomycin; and Zosyn [piperacillin sod-tazobactam so]  REVIEW OF SYSTEMS: All systems negative except as listed in HPI, PMH and Problem list.  LVAD INTERROGATION:  HeartMate II LVAD:  Flow 4.9  liters/min, speed 9200, power 5.5, PI 7.3  Rare PI.   I reviewed the LVAD parameters from today, and compared the results to the patient's prior recorded data.  No programming changes were made.  The LVAD is functioning within specified parameters.  The patient performs LVAD self-test daily.  LVAD interrogation was negative for any significant power changes, alarms or  PI events/speed drops.  LVAD equipment check completed and is in good working order.  Back-up equipment present.   LVAD education done on emergency procedures and precautions and reviewed exit site care.    Vitals:   07/17/16 1053  BP: (!) 90/0  BP Location: Left Arm  Patient Position: Sitting  Weight: 205 lb 3.2 oz (93.1 kg)   MAP Doppler  90  Physical Exam: GENERAL: Well appearing, male who presents to clinic today in no acute distress. Ambulated in the clinic with a cane. Wife present.  HEENT: normal  NECK: Supple, JVP ~10   with prominent CV wave.  2+ bilaterally, no bruits.  No lymphadenopathy or thyromegaly appreciated.   CARDIAC:  Mechanical heart sounds with LVAD hum present.  LUNGS:  Clear  ABDOMEN:  Soft, round, nontender, positive bowel sounds x4.     LVAD exit site: well-healed and incorporated.  Dressing dry and intact.  No erythema or drainage.  Stabilization device present and accurately applied.  EXTREMITIES:  Warm and dry, no cyanosis, clubbing, rash. R and LLE 2-3+  NEUROLOGIC:  Alert and oriented x 4.  Gait steady with assistance of walker.  No aphasia.  No dysarthria.  Affect pleasant.       ASSESSMENT AND PLAN:  1) Chronic systolic HF: ICM, s/p LVAD 04/2012 at [facility]  -  NYHA  II-III symptoms on LVAD support. Volume status elevated. Today he will double torsemide 40 mg twice a day for 2 day then go back to 40 mg daily. After that he can take an extra 20 mg of torsemide for weight 205 pounds or greater.  Would like his weight between 200-202 pounds.  - Reinforced need for daily weights and reviewed use of sliding scale diuretics. 2) LVAD  - Implanted 04/2012 at [facility]. Interrogation ok with rare PI events.   - Draw CMET, LDH, INR and CBC today 3) Chronic Anticoagulation  -Check INR.  INR goal 1.7-2.3 [facility] manages anticoagulation, Jean Rosenthal, NP.   4) Atrial Fibrillation -chronic. On coumadin. No bleeding problems .  5) Fatigue - Likely related to  possible chronic RV pacing and anemia. Asked him to use rolling walker for now with fatigue.  6) HTN -MAP ok. In the past unable to tolerate higher doses of meds due to dizziness. 7) BiV ICD- Followed by EP. 8) Anemia- H/O GI bleed. Last week received a couple units of PRBCs for Hgb 8.2. Todays Hgb is 9.2. He is on iron.   Labs ok today: hgb 9.2, creatinine 1.9, INR 1.7, LDH 187, K 4.9   Return to VAD clinic in 6 months.    Oliviah Agostini NP-C  11:58 AM

## 2016-07-17 NOTE — Patient Instructions (Addendum)
VAD Support Group Monthly Meeting for [name] this upcoming Monday August 21st at 5pm in [facility].   INR today 1.7 - will forward these results to [place] at [facility] for any warfarin changes.   Start taking torsemide 40 mg (2 pills) in the morning and 40 mg (2 pills) tomorrow and Thursday to get rid of the extra fluid your are carrying.   **When your weight increases beyond 205 lbs take an extra 20 mg (1 pill).   Our Emergency VAD Contact is [phone]  Return to see [place] in 6 months.

## 2016-08-13 ENCOUNTER — Telehealth: Payer: Self-pay

## 2016-08-13 ENCOUNTER — Ambulatory Visit (INDEPENDENT_AMBULATORY_CARE_PROVIDER_SITE_OTHER): Payer: Medicare Other

## 2016-08-13 DIAGNOSIS — I509 Heart failure, unspecified: Secondary | ICD-10-CM | POA: Diagnosis not present

## 2016-08-13 DIAGNOSIS — Z95810 Presence of automatic (implantable) cardiac defibrillator: Secondary | ICD-10-CM | POA: Diagnosis not present

## 2016-08-13 NOTE — Telephone Encounter (Signed)
Remote ICM transmission received.  Attempted 1st ICM remote call to patient and left message for return call.

## 2016-08-13 NOTE — Progress Notes (Signed)
EPIC Encounter for ICM Monitoring  Patient Name: [name] is a 75 y.o. male Date: 08/13/2016 Primary Care Physican: Ignatius Specking, MD Primary Cardiologist: Bensimhon Electrophysiologist: Allred Dry Weight:  unknown Bi-V Pacing:  62.9%      AT/AF 31 episodes Time in AT/AF 18.0 hr/day (75.0%) AT/AF >= 6 hr for 33 days.  Attempted ICM call and unable to reach.  Transmission reviewed.   Thoracic impedance normal.  Follow-up plan: ICM clinic phone appointment on 09/13/2016.  Copy of ICM check sent to device physician.   ICM trend: 08/13/2016       Karie Soda, RN 08/13/2016 10:49 AM

## 2016-08-20 ENCOUNTER — Other Ambulatory Visit (HOSPITAL_COMMUNITY): Payer: Self-pay | Admitting: Sports Medicine

## 2016-08-20 DIAGNOSIS — M25559 Pain in unspecified hip: Secondary | ICD-10-CM

## 2016-08-27 ENCOUNTER — Ambulatory Visit (HOSPITAL_COMMUNITY): Payer: Medicare Other

## 2016-09-13 ENCOUNTER — Telehealth: Payer: Self-pay | Admitting: Cardiology

## 2016-09-13 NOTE — Telephone Encounter (Signed)
Pt wife stated that pt is at [facility] w/ a broken ankle and he will need to go to a rehab facility after discharge.

## 2016-10-08 ENCOUNTER — Encounter: Payer: Medicare Other | Admitting: *Deleted

## 2016-10-08 ENCOUNTER — Telehealth: Payer: Self-pay | Admitting: Cardiology

## 2016-10-08 NOTE — Telephone Encounter (Signed)
Spoke with pt and reminded pt of remote transmission that is due today. Pt verbalized understanding.   

## 2016-10-10 NOTE — Progress Notes (Signed)
No ICM remote transmission received on 10/08/2016.   Next ICM transmission scheduled for 10/15/2016.  Per [employer] note, patient had 3 hospitalizations since September with last one being 10/07/2016.  Per note, patient may be going to SNF.

## 2016-10-12 ENCOUNTER — Encounter: Payer: Self-pay | Admitting: Cardiology

## 2016-10-15 ENCOUNTER — Telehealth: Payer: Self-pay | Admitting: Cardiology

## 2016-10-15 NOTE — Progress Notes (Signed)
Patient currently in [facility].  ICM remote transmission rescheduled for 11/15/2016.

## 2016-10-15 NOTE — Telephone Encounter (Signed)
Confirmed remote transmission w/ pt wife.  She stated that pt has been at [facility] for 7 weeks. She stated  That pt is having a endoscopy. Informed pt wife to call office to reschedule appointments when pt is going home pt wife verbalized understanding.

## 2016-11-15 ENCOUNTER — Telehealth: Payer: Self-pay | Admitting: Cardiology

## 2016-11-15 NOTE — Telephone Encounter (Signed)
LMOVM reminding pt to send remote transmission.   

## 2016-11-16 NOTE — Progress Notes (Unsigned)
No ICM remote transmission received on 11/15/2016.   Next ICM transmission scheduled for 12/11/2016.  

## 2016-12-11 ENCOUNTER — Telehealth: Payer: Self-pay | Admitting: Cardiology

## 2016-12-11 NOTE — Telephone Encounter (Signed)
Attempted to confirm remote transmission with pt. No answer and was unable to leave a message.   

## 2016-12-21 NOTE — Progress Notes (Signed)
No ICM remote transmission received on 12/11/2016.  Unable to reach patient for ICM clinic since 08/2016.  No further ICM monthly follow ups at this time due to unable to reach.

## 2017-01-14 ENCOUNTER — Telehealth (HOSPITAL_COMMUNITY): Payer: Self-pay | Admitting: Infectious Diseases

## 2017-01-14 NOTE — Telephone Encounter (Signed)
VM on VAD line requesting if they need to keep the appointment with VAD team at [facility] this week - his wife reports that he just got home from an extensive hospitalization and rehabilitation the end of January and have follow up with [facility] scheduled.   No ability to leave VM on main line or cell phone. Will call back to discuss with patient further.   Rexene Alberts, RN VAD Coordinator   Office: [phone] 24/7 Emergency VAD Pager: [phone]

## 2017-01-16 NOTE — Telephone Encounter (Signed)
Discussed with Johnny Bridge regarding [facility] appt this week - requesting to cancel this appointment and re-evaluate Shared Care status when they go back for FU at [facility] in March since he has had such a complicated course with post-VAD GI bleeding, fall/fracture and rehabilitation needs. Would like for continuity to post-pone [facility] visit at this time. Scheduled out 3 months.   Rexene Alberts, RN VAD Coordinator   Office: [phone] 24/7 Emergency VAD Pager: [phone]

## 2017-01-17 ENCOUNTER — Encounter (HOSPITAL_COMMUNITY): Payer: Medicare Other

## 2017-02-01 ENCOUNTER — Other Ambulatory Visit (HOSPITAL_COMMUNITY): Payer: Self-pay | Admitting: Infectious Diseases

## 2017-02-01 DIAGNOSIS — N184 Chronic kidney disease, stage 4 (severe): Secondary | ICD-10-CM

## 2017-02-01 DIAGNOSIS — D500 Iron deficiency anemia secondary to blood loss (chronic): Secondary | ICD-10-CM

## 2017-02-01 DIAGNOSIS — Z8719 Personal history of other diseases of the digestive system: Secondary | ICD-10-CM

## 2017-02-01 DIAGNOSIS — I130 Hypertensive heart and chronic kidney disease with heart failure and stage 1 through stage 4 chronic kidney disease, or unspecified chronic kidney disease: Secondary | ICD-10-CM

## 2017-02-07 ENCOUNTER — Other Ambulatory Visit (HOSPITAL_COMMUNITY): Payer: Self-pay | Admitting: Infectious Diseases

## 2017-02-07 ENCOUNTER — Telehealth (HOSPITAL_COMMUNITY): Payer: Self-pay | Admitting: Infectious Diseases

## 2017-02-07 DIAGNOSIS — D500 Iron deficiency anemia secondary to blood loss (chronic): Secondary | ICD-10-CM

## 2017-02-07 DIAGNOSIS — I130 Hypertensive heart and chronic kidney disease with heart failure and stage 1 through stage 4 chronic kidney disease, or unspecified chronic kidney disease: Secondary | ICD-10-CM

## 2017-02-07 DIAGNOSIS — Z8719 Personal history of other diseases of the digestive system: Secondary | ICD-10-CM

## 2017-02-07 NOTE — Addendum Note (Signed)
Addended by: Blanchard Kelch on: 02/07/2017 01:07 PM   Modules accepted: Orders

## 2017-02-07 NOTE — Telephone Encounter (Signed)
Called to make appointment for [place] re: aranesp, IV Fe and blood transfusion needs per [facility] VAD Team request. Referral sent on 02/01/17 and left VM today on scheduling line for CB to make an appointment.   Rexene Alberts, RN VAD Coordinator   Office: [phone] 24/7 Emergency VAD Pager: [phone]

## 2017-02-08 ENCOUNTER — Ambulatory Visit: Payer: Medicare Other

## 2017-02-08 ENCOUNTER — Ambulatory Visit (HOSPITAL_BASED_OUTPATIENT_CLINIC_OR_DEPARTMENT_OTHER): Payer: Medicare Other | Admitting: Hematology & Oncology

## 2017-02-08 ENCOUNTER — Other Ambulatory Visit (HOSPITAL_BASED_OUTPATIENT_CLINIC_OR_DEPARTMENT_OTHER): Payer: Medicare Other

## 2017-02-08 ENCOUNTER — Encounter: Payer: Self-pay | Admitting: Hematology & Oncology

## 2017-02-08 ENCOUNTER — Ambulatory Visit (HOSPITAL_COMMUNITY)
Admission: RE | Admit: 2017-02-08 | Discharge: 2017-02-08 | Disposition: A | Discharge status: Home / Self Care | Payer: Medicare Other | Source: Ambulatory Visit | Attending: Hematology & Oncology | Admitting: Hematology & Oncology

## 2017-02-08 VITALS — BP 119/0 | HR 64 | Resp 17

## 2017-02-08 DIAGNOSIS — K2901 Acute gastritis with bleeding: Secondary | ICD-10-CM

## 2017-02-08 DIAGNOSIS — K922 Gastrointestinal hemorrhage, unspecified: Secondary | ICD-10-CM | POA: Diagnosis not present

## 2017-02-08 DIAGNOSIS — J449 Chronic obstructive pulmonary disease, unspecified: Secondary | ICD-10-CM | POA: Diagnosis not present

## 2017-02-08 DIAGNOSIS — D500 Iron deficiency anemia secondary to blood loss (chronic): Secondary | ICD-10-CM

## 2017-02-08 DIAGNOSIS — N289 Disorder of kidney and ureter, unspecified: Secondary | ICD-10-CM | POA: Diagnosis not present

## 2017-02-08 DIAGNOSIS — Z7901 Long term (current) use of anticoagulants: Secondary | ICD-10-CM | POA: Diagnosis not present

## 2017-02-08 DIAGNOSIS — K909 Intestinal malabsorption, unspecified: Secondary | ICD-10-CM

## 2017-02-08 DIAGNOSIS — K562 Volvulus: Secondary | ICD-10-CM

## 2017-02-08 DIAGNOSIS — D631 Anemia in chronic kidney disease: Secondary | ICD-10-CM

## 2017-02-08 DIAGNOSIS — D6804 Acquired von Willebrand disease: Secondary | ICD-10-CM

## 2017-02-08 DIAGNOSIS — I509 Heart failure, unspecified: Secondary | ICD-10-CM | POA: Diagnosis not present

## 2017-02-08 DIAGNOSIS — D680 Von Willebrand's disease: Secondary | ICD-10-CM

## 2017-02-08 DIAGNOSIS — N184 Chronic kidney disease, stage 4 (severe): Secondary | ICD-10-CM

## 2017-02-08 LAB — CBC WITH DIFFERENTIAL (CANCER CENTER ONLY)
BASO#: 0 10*3/uL (ref 0.0–0.2)
BASO%: 0.1 % (ref 0.0–2.0)
EOS ABS: 0 10*3/uL (ref 0.0–0.5)
EOS%: 0 % (ref 0.0–7.0)
HCT: 23.8 % — ABNORMAL LOW (ref 38.7–49.9)
HEMOGLOBIN: 7.2 g/dL — AB (ref 13.0–17.1)
LYMPH#: 0.3 10*3/uL — ABNORMAL LOW (ref 0.9–3.3)
LYMPH%: 2.7 % — ABNORMAL LOW (ref 14.0–48.0)
MCH: 27.9 pg — AB (ref 28.0–33.4)
MCHC: 30.3 g/dL — AB (ref 32.0–35.9)
MCV: 92 fL (ref 82–98)
MONO#: 0.7 10*3/uL (ref 0.1–0.9)
MONO%: 7.1 % (ref 0.0–13.0)
NEUT#: 8.8 10*3/uL — ABNORMAL HIGH (ref 1.5–6.5)
NEUT%: 90.1 % — AB (ref 40.0–80.0)
Platelets: 223 10*3/uL (ref 145–400)
RBC: 2.58 10*6/uL — ABNORMAL LOW (ref 4.20–5.70)
RDW: 17.9 % — AB (ref 11.1–15.7)
WBC: 9.8 10*3/uL (ref 4.0–10.0)

## 2017-02-08 LAB — PREPARE RBC (CROSSMATCH)

## 2017-02-08 LAB — COMPREHENSIVE METABOLIC PANEL (CC13)
A/G RATIO: 1.2 (ref 1.2–2.2)
ALK PHOS: 103 IU/L (ref 39–117)
ALT: 13 IU/L (ref 0–44)
AST (SGOT): 13 IU/L (ref 0–40)
Albumin, Serum: 2.9 g/dL — ABNORMAL LOW (ref 3.5–4.8)
BILIRUBIN TOTAL: 0.4 mg/dL (ref 0.0–1.2)
BUN / CREAT RATIO: 28 — AB (ref 10–24)
BUN: 55 mg/dL — AB (ref 8–27)
CHLORIDE: 100 mmol/L (ref 96–106)
Calcium, Ser: 8.2 mg/dL — ABNORMAL LOW (ref 8.6–10.2)
Carbon Dioxide, Total: 24 mmol/L (ref 18–29)
Creatinine, Ser: 1.97 mg/dL — ABNORMAL HIGH (ref 0.76–1.27)
GFR calc non Af Amer: 32 mL/min/{1.73_m2} — ABNORMAL LOW (ref >59)
GFR, EST AFRICAN AMERICAN: 37 mL/min/{1.73_m2} — AB (ref >59)
GLUCOSE: 123 mg/dL — AB (ref 65–99)
Globulin, Total: 2.5 g/dL (ref 1.5–4.5)
POTASSIUM: 4.2 mmol/L (ref 3.5–5.2)
SODIUM: 134 mmol/L (ref 134–144)
TOTAL PROTEIN: 5.4 g/dL — AB (ref 6.0–8.5)

## 2017-02-08 LAB — CHCC SATELLITE - SMEAR

## 2017-02-08 LAB — ABO/RH: ABO/RH(D): A POS

## 2017-02-09 LAB — RETICULOCYTES: RETICULOCYTE COUNT: 2.6 % (ref 0.6–2.6)

## 2017-02-09 LAB — VON WILLEBRAND PANEL
Factor VIII Activity: 212 % — ABNORMAL HIGH (ref 57–163)
vWF Activity: 213 % — ABNORMAL HIGH (ref 50–200)
von Willebrand Factor (vWF) Ag: 308 % — ABNORMAL HIGH (ref 50–200)

## 2017-02-11 ENCOUNTER — Encounter: Payer: Self-pay | Admitting: Hematology & Oncology

## 2017-02-11 ENCOUNTER — Other Ambulatory Visit: Payer: Self-pay | Admitting: Family

## 2017-02-11 ENCOUNTER — Ambulatory Visit (HOSPITAL_BASED_OUTPATIENT_CLINIC_OR_DEPARTMENT_OTHER): Payer: Medicare Other

## 2017-02-11 DIAGNOSIS — K909 Intestinal malabsorption, unspecified: Secondary | ICD-10-CM

## 2017-02-11 DIAGNOSIS — D500 Iron deficiency anemia secondary to blood loss (chronic): Secondary | ICD-10-CM

## 2017-02-11 DIAGNOSIS — K8689 Other specified diseases of pancreas: Secondary | ICD-10-CM

## 2017-02-11 DIAGNOSIS — K922 Gastrointestinal hemorrhage, unspecified: Secondary | ICD-10-CM

## 2017-02-11 DIAGNOSIS — N184 Chronic kidney disease, stage 4 (severe): Secondary | ICD-10-CM

## 2017-02-11 DIAGNOSIS — K2901 Acute gastritis with bleeding: Secondary | ICD-10-CM

## 2017-02-11 DIAGNOSIS — D631 Anemia in chronic kidney disease: Secondary | ICD-10-CM

## 2017-02-11 HISTORY — DX: Anemia in chronic kidney disease: N18.4

## 2017-02-11 HISTORY — DX: Iron deficiency anemia secondary to blood loss (chronic): D50.0

## 2017-02-11 HISTORY — DX: Anemia in chronic kidney disease: D63.1

## 2017-02-11 HISTORY — DX: Intestinal malabsorption, unspecified: K90.9

## 2017-02-11 LAB — HEMOGLOBINOPATHY EVALUATION
HEMOGLOBIN F QUANTITATION: 0 % (ref 0.0–2.0)
HGB C: 0 %
HGB S: 0 %
HGB VARIANT: 0 %
Hemoglobin A2 Quantitation: 2.2 % (ref 1.8–3.2)
Hgb A: 97.8 % (ref 96.4–98.8)

## 2017-02-11 LAB — IRON AND TIBC
%SAT: 5 % — AB (ref 20–55)
IRON: 11 ug/dL — AB (ref 42–163)
TIBC: 231 ug/dL (ref 202–409)
UIBC: 220 ug/dL (ref 117–376)

## 2017-02-11 LAB — ERYTHROPOIETIN: ERYTHROPOIETIN: 68.3 m[IU]/mL — AB (ref 2.6–18.5)

## 2017-02-11 LAB — FERRITIN: FERRITIN: 84 ng/mL (ref 22–316)

## 2017-02-11 MED ORDER — FUROSEMIDE 10 MG/ML IJ SOLN: 20.0000 mg | Freq: Once | INTRAMUSCULAR | Status: DC

## 2017-02-11 MED ORDER — DIPHENHYDRAMINE HCL 25 MG PO CAPS
ORAL_CAPSULE | ORAL | Status: AC
Start: 2017-02-11 — End: ?
  Filled 2017-02-11: qty 1

## 2017-02-11 MED ORDER — DIPHENHYDRAMINE HCL 25 MG PO CAPS
25.0000 mg | ORAL_CAPSULE | Freq: Once | ORAL | Status: AC
  Administered 2017-02-11: 25 mg via ORAL

## 2017-02-11 MED ORDER — ACETAMINOPHEN 325 MG PO TABS
ORAL_TABLET | ORAL | Status: AC
  Filled 2017-02-11: qty 2

## 2017-02-11 MED ORDER — ACETAMINOPHEN 325 MG PO TABS
650.0000 mg | ORAL_TABLET | Freq: Once | ORAL | Status: AC
  Administered 2017-02-11: 650 mg via ORAL

## 2017-02-11 MED ORDER — SODIUM CHLORIDE 0.9 % IV SOLN
250.0000 mL | Freq: Once | INTRAVENOUS | Status: AC
  Administered 2017-02-11: 250 mL via INTRAVENOUS

## 2017-02-11 NOTE — Patient Instructions (Signed)
Blood Transfusion , Adult A blood transfusion is a procedure in which you receive donated blood, including plasma, platelets, and red blood cells, through an IV tube. You may need a blood transfusion because of illness, surgery, or injury. The blood may come from a donor. You may also be able to donate blood for yourself (autologous blood donation) before a surgery if you know that you might require a blood transfusion. The blood given in a transfusion is made up of different types of cells. You may receive:  Red blood cells. These carry oxygen to the cells in the body.  White blood cells. These help you fight infections.  Platelets. These help your blood to clot.  Plasma. This is the liquid part of your blood and it helps with fluid imbalances. If you have hemophilia or another clotting disorder, you may also receive other types of blood products. Tell a health care provider about:  Any allergies you have.  All medicines you are taking, including vitamins, herbs, eye drops, creams, and over-the-counter medicines.  Any problems you or family members have had with anesthetic medicines.  Any blood disorders you have.  Any surgeries you have had.  Any medical conditions you have, including any recent fever or cold symptoms.  Whether you are pregnant or may be pregnant.  Any previous reactions you have had during a blood transfusion. What are the risks? Generally, this is a safe procedure. However, problems may occur, including:  Having an allergic reaction to something in the donated blood. Hives and itching may be symptoms of this type of reaction.  Fever. This may be a reaction to the white blood cells in the transfused blood. Nausea or chest pain may accompany a fever.  Iron overload. This can happen from having many transfusions.  Transfusion-related acute lung injury (TRALI). This is a rare reaction that causes lung damage. The cause is not known.TRALI can occur within hours  of a transfusion or several days later.  Sudden (acute) or delayed hemolytic reactions. This happens if your blood does not match the cells in your transfusion. Your body's defense system (immune system) may try to attack the new cells. This complication is rare. The symptoms include fever, chills, nausea, and low back pain or chest pain.  Infection or disease transmission. This is rare. What happens before the procedure?  You will have a blood test to determine your blood type. This is necessary to know what kind of blood your body will accept and to match it to the donor blood.  If you are going to have a planned surgery, you may be able to do an autologous blood donation. This may be done in case you need to have a transfusion.  If you have had an allergic reaction to a transfusion in the past, you may be given medicine to help prevent a reaction. This medicine may be given to you by mouth or through an IV tube.  You will have your temperature, blood pressure, and pulse monitored before the transfusion.  Follow instructions from your health care provider about eating and drinking restrictions.  Ask your health care provider about:  Changing or stopping your regular medicines. This is especially important if you are taking diabetes medicines or blood thinners.  Taking medicines such as aspirin and ibuprofen. These medicines can thin your blood. Do not take these medicines before your procedure if your health care provider instructs you not to. What happens during the procedure?  An IV tube will be   inserted into one of your veins.  The bag of donated blood will be attached to your IV tube. The blood will then enter through your vein.  Your temperature, blood pressure, and pulse will be monitored regularly during the transfusion. This monitoring is done to detect early signs of a transfusion reaction.  If you have any signs or symptoms of a reaction, your transfusion will be stopped and  you may be given medicine.  When the transfusion is complete, your IV tube will be removed.  Pressure may be applied to the IV site for a few minutes.  A bandage (dressing) will be applied. The procedure may vary among health care providers and hospitals. What happens after the procedure?  Your temperature, blood pressure, heart rate, breathing rate, and blood oxygen level will be monitored often.  Your blood may be tested to see how you are responding to the transfusion.  You may be warmed with fluids or blankets to maintain a normal body temperature. Summary  A blood transfusion is a procedure in which you receive donated blood, including plasma, platelets, and red blood cells, through an IV tube.  Your temperature, blood pressure, and pulse will be monitored before, during, and after the transfusion.  Your blood may be tested after the transfusion to see how your body has responded. This information is not intended to replace advice given to you by your health care provider. Make sure you discuss any questions you have with your health care provider. Document Released: 11/16/2000 Document Revised: 08/16/2016 Document Reviewed: 08/16/2016 Elsevier Interactive Patient Education  2017 Elsevier Inc.  

## 2017-02-11 NOTE — Progress Notes (Signed)
Referral MD  Reason for Referral: Multifactorial anemia-GI bleeding, renal insufficiency, iron deficiency secondary to bowel torsion and bleeding   No chief complaint on file. : I am always anemic.  HPI: Mr. [name] is a very nice 76 year old white male. 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 I thanked him for his service to our country.  He has been seen at [facility]. He has a LVAD for congestive heart failure. He is on anticoagulation. He has underlying COPD. He probably has a past history of a thrombus in the left leg.  He has been admitted at [facility] in the past.  He lives up in [place]. It is just too hard for him to keep going to [facility] for Aranesp injections and iron infusions. [facility] is suggested that he come to the [facility]. As such, he has come to the [facility].  He is in a wheelchair. He has a marginal performance status at best-ECOG 2-3.  He has been in rehabilitation. He fell and sustained a fracture in his foot.  He has had GI bleeding. Again, he is been seen at [facility] for the most part.  He is on quite a few medications. He is on Coumadin. This is being followed by a Coumadin clinic.  He is taking oral iron. Not sure why he is taking oral iron. I don't think this is working for him.  His appetite is okay. He's had no nausea or vomiting. I am not sure how much he really eats.  He has no obvious shortness of breath. Again he really is not able to do much at all because of the LVAD device.      Past Medical History:  Diagnosis Date  . Allergy   . Anemia   . Angina   . Anxiety   . Blood transfusion   . CAD (coronary artery disease)   . Cataract   . CHF (congestive heart failure) (HCC)   . Clotting disorder (HCC)   . COPD (chronic obstructive pulmonary disease) (HCC)   . Diabetes mellitus   . DVT of lower extremity (deep venous thrombosis) (HCC)    left  . Gout   . H/O hiatal hernia   . Heart murmur   . High cholesterol   . Hypertension    . Hypotension    ORTHOSTATIC  . Jaundice    "@ birth & when I was in 7th grade"  . Mitral valve disorder   . Myocardial infarction 1988  . Neuromuscular disorder (HCC)   . Osteoporosis   . Pneumonia 1993; 2009   "both serious cases"  . Pneumonia 2010   "not hospitalized"  . Pulmonary embolism on left (HCC) 1988  . 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 Shortness of breath on exertion   . Sleep apnea   . Stomach ulcer 1987-1988  . 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 BIV  11/2010   ICD 12/11 MEDTRONIC  . CARDIAC CATHETERIZATION  12/13/11  . CATARACT EXTRACTION W/ INTRAOCULAR LENS  IMPLANT, BILATERAL  1990's  . CORONARY ANGIOPLASTY  1988  . CORONARY ANGIOPLASTY WITH STENT PLACEMENT  2003   "1"  . EXPLORATORY LAPAROTOMY W/ BOWEL RESECTION  1993  . HIP FRACTURE SURGERY Right 2014  . IMPLANTABLE CARDIOVERTER DEFIBRILLATOR GENERATOR CHANGE  08/19/13   MDT Coletta Memos CRT-D generator change  by Dr Wilford Grist at [facility].  RV lead with chronically elevated threshold and low R waves not revised.  LV lead with chronic diaphrgramtic stim turned off at [facility].  . leff ventricular assist device (LVAD)  05/02/12   LVAD implanted at [facility]  . RIGHT HEART CATHETERIZATION N/A 12/13/2011   Procedure: RIGHT HEART CATH;  Surgeon: Dolores Patty, MD;  Location: [facility] CATH LAB;  Service: Cardiovascular;  Laterality: N/A;  . RIGHT HEART CATHETERIZATION N/A 02/08/2012   Procedure: RIGHT HEART CATH;  Surgeon: Dolores Patty, MD;  Location: [facility] CATH LAB;  Service: Cardiovascular;  Laterality: N/A;  . TRACHEOSTOMY  1993  :   Current Outpatient Prescriptions:  .  diclofenac sodium (VOLTAREN) 1 % GEL, Apply topically., Disp: , Rfl:  .  hydroxychloroquine (PLAQUENIL) 200 MG tablet, Take by mouth daily. 2 tabs with breakfast, Disp: , Rfl:  .  Melatonin 3-10 MG TABS, Take 3 mg by mouth at bedtime as  needed., Disp: , Rfl:  .  metoprolol tartrate (LOPRESSOR) 25 MG tablet, Take 25 mg by mouth 2 (two) times daily., Disp: , Rfl:  .  Mirtazapine (REMERON PO), Take by mouth at bedtime., Disp: , Rfl:  .  pantoprazole (PROTONIX) 40 MG tablet, Take 40 mg by mouth daily., Disp: , Rfl:  .  tamsulosin (FLOMAX) 0.4 MG CAPS capsule, Take 0.4 mg by mouth daily., Disp: , Rfl:  .  VENLAFAXINE HCL PO, Take by mouth., Disp: , Rfl:  .  acetaminophen (TYLENOL) 500 MG tablet, Take 500 mg by mouth every 6 (six) hours as needed., Disp: , Rfl:  .  albuterol (PROVENTIL HFA;VENTOLIN HFA) 108 (90 BASE) MCG/ACT inhaler, Inhale 1-2 puffs into the lungs as needed for wheezing or shortness of breath., Disp: , Rfl:  .  budesonide-formoterol (SYMBICORT) 160-4.5 MCG/ACT inhaler, Inhale 2 puffs into the lungs daily as needed., Disp: , Rfl:  .  calcium carbonate (OS-CAL) 600 MG TABS tablet, Take 600 mg by mouth 2 (two) times daily., Disp: , Rfl:  .  Cholecalciferol (VITAMIN D3) 2000 UNITS TABS, Take 1 tablet by mouth daily., Disp: , Rfl:  .  colchicine 0.6 MG tablet, Take 0.6 mg by mouth daily., Disp: , Rfl:  .  ferrous sulfate 324 (65 FE) MG TBEC, Take 1 tablet by mouth daily., Disp: , Rfl:  .  Multiple Vitamin (MULTIVITAMIN) tablet, Take 1 tablet by mouth daily., Disp: , Rfl:  .  pravastatin (PRAVACHOL) 40 MG tablet, Take one by mouth daily, Disp: , Rfl:  .  torsemide (DEMADEX) 20 MG tablet, Take 40 mg by mouth every morning. May take an extra tab in the evening as needed., Disp: , Rfl:  .  ULORIC 80 MG TABS, , Disp: , Rfl:  .  warfarin (COUMADIN) 2 MG tablet, Take 2-4 mg by mouth as directed. Managed by [facility] Patient checks at home, Disp: , Rfl:  No current facility-administered medications for this visit.   Facility-Administered Medications Ordered in Other Visits:  .  furosemide (LASIX) injection 20 mg, 20 mg, Intravenous, Once, Josph Macho, MD:  :  Allergies  Allergen Reactions  . Amoxicillin-Pot Clavulanate Rash   . Levofloxacin Other (See Comments)    Doesn't tolerate  . Oxycodone Hives  . Ace Inhibitors Rash  . Ceftriaxone Rash  . Cefuroxime Rash  . Chlorhexidine Gluconate Rash    H/o recurrent cellulitis and rash   . Clindamycin/Lincomycin Rash    Erythematous drug eruption  . Hydralazine Itching  . Meropenem Rash  .  Sulfonamide Derivatives Rash    Mother was highly allergic. Patient untested.   . Vancomycin Rash  . Zosyn [Piperacillin Sod-Tazobactam So] Rash    No other systemic symptoms  :  No family history on file.:  Social History   Social History  . Marital status: Married    Spouse name: N/A  . Number of children: N/A  . Years of education: N/A   Occupational History  . Not on file.   Social History Main Topics  . Smoking status: Former Smoker    Packs/day: 1.00    Years: 30.00    Types: Cigarettes    Quit date: 12/03/1986  . Smokeless tobacco: Never Used  . Alcohol use No  . Drug use: No  . Sexual activity: No   Other Topics Concern  . Not on file   Social History Narrative  . No narrative on file  :  Pertinent items are noted in HPI.  Exam: Chronically ill-appearing white male in no obvious distress. Vital signs show a temperature of 98 8. Pulse 64. Blood pressure 122/50. His weight, was not done. Head and neck exam shows no ocular or oral lesions. He has no palpable cervical or supraclavicular lymph nodes. He has some pale conjunctiva. Lungs are with decreased breath sounds throughout all lung fields. Cardiac exam shows a constant pan systolic and diastolic "hum" from his LVAD device. Abdomen is soft. He has decent bowel sounds. There is no fluid wave. There is no palpable liver or spleen. Extremities shows chronic 2+ edema in his legs. Neurological exam shows no focal neurological deficits.       [facility]  02/08/17 1426  WBC 9.8  HGB 7.2*  HCT 23.8*  PLT 223    Recent Labs  02/08/17 1426  NA 134  K 4.2  CL 100  CO2 24  GLUCOSE 123*   BUN 55*  CREATININE 1.97*  CALCIUM 8.2*    Blood smear review:  Mild anisocytosis and poikilocytosis. He has no nucleated red blood cells. He has some microcytic red blood cells. There is no rouleau formation. He has no target cells. White cells been normal in morphology maturation. Has no immature myeloid or lymphoid forms. Platelets are adequate in number and size. Platelets are well granulated.  Pathology:None  Assessment a Mr. Sinkfield is a 76 year old white male. He has multifactorial anemia.  His ferritin is 84 and his iron saturation is only 5%. He clearly needs IV iron.  He is quite anemic. I will have to go ahead and give him 2 units of blood.  He also has renal insufficiency. I did send off an erythropoietin level on him. We'll see what this shows. He barely has had Aranesp in the past.  Somehow, he was diagnosed with acquired von Willebrand's disease. I checked von Willebrand levels on him. They were all normal.  I don't think that there is much that we can do to stop him from being anemic. We can try to minimize this with blood, iron infusions and Aranesp.  I think that as long as he is on the Coumadin and has the LVAD device, we just are going to have to address the problems as they arise.  They [place]. I asked them if they wanted to be seen at the [facility] They told me that they preferred to see me. However, they would be willing just to go to the [facility] for injections. I think this would be very  reasonable. I'll have to talk to the staff over the [facility].  We will transfuse him 2 units of blood on March 12. We also like to give him iron. I will see what his erythropoietin level lives and that will help [place] determine how much Aranesp to give him.  I will plan to see him back in another 3 weeks.  I spent about 50 minutes with he and his wife. They are both very nice.

## 2017-02-12 LAB — BPAM RBC
BLOOD PRODUCT EXPIRATION DATE: 201803292359
Blood Product Expiration Date: 201803302359
ISSUE DATE / TIME: 201803120821
ISSUE DATE / TIME: 201803120821
UNIT TYPE AND RH: 6200
Unit Type and Rh: 6200

## 2017-02-12 LAB — TYPE AND SCREEN
ABO/RH(D): A POS
Antibody Screen: NEGATIVE
UNIT DIVISION: 0
Unit division: 0

## 2017-02-15 ENCOUNTER — Encounter: Payer: Self-pay | Admitting: Cardiology

## 2017-02-19 ENCOUNTER — Ambulatory Visit (HOSPITAL_BASED_OUTPATIENT_CLINIC_OR_DEPARTMENT_OTHER): Payer: Medicare Other

## 2017-02-19 ENCOUNTER — Ambulatory Visit (HOSPITAL_COMMUNITY): Payer: Medicare Other

## 2017-02-19 VITALS — BP 74/57 | HR 93 | Temp 98.7°F | Resp 16

## 2017-02-19 DIAGNOSIS — D631 Anemia in chronic kidney disease: Secondary | ICD-10-CM

## 2017-02-19 DIAGNOSIS — N184 Chronic kidney disease, stage 4 (severe): Secondary | ICD-10-CM

## 2017-02-19 DIAGNOSIS — K909 Intestinal malabsorption, unspecified: Secondary | ICD-10-CM

## 2017-02-19 DIAGNOSIS — K922 Gastrointestinal hemorrhage, unspecified: Secondary | ICD-10-CM | POA: Diagnosis not present

## 2017-02-19 DIAGNOSIS — D500 Iron deficiency anemia secondary to blood loss (chronic): Secondary | ICD-10-CM | POA: Diagnosis present

## 2017-02-19 MED ORDER — SODIUM CHLORIDE 0.9 % IV SOLN
Freq: Once | INTRAVENOUS | Status: AC
  Administered 2017-02-19: 16:00:00 via INTRAVENOUS

## 2017-02-19 MED ORDER — SODIUM CHLORIDE 0.9 % IV SOLN
510.0000 mg | Freq: Once | INTRAVENOUS | Status: AC
  Administered 2017-02-19: 510 mg via INTRAVENOUS
  Filled 2017-02-19: qty 17

## 2017-02-19 NOTE — Addendum Note (Signed)
Addended by: Arlan Organ R on: 02/19/2017 02:25 PM   Modules accepted: Orders

## 2017-02-19 NOTE — Patient Instructions (Signed)

## 2017-02-21 ENCOUNTER — Encounter: Payer: Self-pay | Admitting: *Deleted

## 2017-02-21 ENCOUNTER — Ambulatory Visit (INDEPENDENT_AMBULATORY_CARE_PROVIDER_SITE_OTHER): Payer: Medicare Other | Admitting: *Deleted

## 2017-02-21 DIAGNOSIS — I255 Ischemic cardiomyopathy: Secondary | ICD-10-CM

## 2017-02-21 NOTE — Progress Notes (Signed)
[facility] Psychosocial Distress Screening Clinical Social Work  Clinical Social Work was referred by distress screening protocol.  The patient scored a 5 on the Psychosocial Distress Thermometer which indicates moderate distress. Clinical Social Worker reviewed chart and distress screen to assess for distress and other psychosocial needs. Pt has anemia, does not appear to have cancer diagnosis and was screened in error. Medical team addressed physical concerns per note dated 02/08/17  [facility] DISTRESS SCREENING 02/08/2017  Distress experienced in past week (1-10) 5  Emotional problem type Nervousness/Anxiety;Boredom  Physical Problem type Pain;Sleep/insomnia;Skin dry/itchy;Swollen arms/legs    Clinical Social Worker follow up needed: No.  If yes, follow up plan:  Doreen Salvage, [place], OSW-C Clinical Social Worker [facility]  [facility] Phone: [phone] Fax: [phone]

## 2017-02-26 ENCOUNTER — Other Ambulatory Visit (HOSPITAL_BASED_OUTPATIENT_CLINIC_OR_DEPARTMENT_OTHER): Payer: Medicare Other

## 2017-02-26 ENCOUNTER — Ambulatory Visit (HOSPITAL_BASED_OUTPATIENT_CLINIC_OR_DEPARTMENT_OTHER): Payer: Medicare Other | Admitting: Hematology & Oncology

## 2017-02-26 ENCOUNTER — Ambulatory Visit (HOSPITAL_BASED_OUTPATIENT_CLINIC_OR_DEPARTMENT_OTHER): Payer: Medicare Other

## 2017-02-26 VITALS — BP 92/70 | HR 83 | Resp 19 | Wt 191.0 lb

## 2017-02-26 DIAGNOSIS — K909 Intestinal malabsorption, unspecified: Secondary | ICD-10-CM

## 2017-02-26 DIAGNOSIS — Z7901 Long term (current) use of anticoagulants: Secondary | ICD-10-CM

## 2017-02-26 DIAGNOSIS — D631 Anemia in chronic kidney disease: Secondary | ICD-10-CM

## 2017-02-26 DIAGNOSIS — D6804 Acquired von Willebrand disease: Secondary | ICD-10-CM

## 2017-02-26 DIAGNOSIS — K2901 Acute gastritis with bleeding: Secondary | ICD-10-CM

## 2017-02-26 DIAGNOSIS — D500 Iron deficiency anemia secondary to blood loss (chronic): Secondary | ICD-10-CM | POA: Diagnosis not present

## 2017-02-26 DIAGNOSIS — D680 Von Willebrand's disease: Secondary | ICD-10-CM

## 2017-02-26 DIAGNOSIS — N184 Chronic kidney disease, stage 4 (severe): Secondary | ICD-10-CM

## 2017-02-26 DIAGNOSIS — I509 Heart failure, unspecified: Secondary | ICD-10-CM

## 2017-02-26 DIAGNOSIS — K922 Gastrointestinal hemorrhage, unspecified: Secondary | ICD-10-CM

## 2017-02-26 DIAGNOSIS — N189 Chronic kidney disease, unspecified: Secondary | ICD-10-CM | POA: Diagnosis present

## 2017-02-26 DIAGNOSIS — N289 Disorder of kidney and ureter, unspecified: Secondary | ICD-10-CM

## 2017-02-26 DIAGNOSIS — Z95811 Presence of heart assist device: Secondary | ICD-10-CM

## 2017-02-26 LAB — CBC WITH DIFFERENTIAL (CANCER CENTER ONLY)
BASO#: 0.1 10*3/uL (ref 0.0–0.2)
BASO%: 0.5 % (ref 0.0–2.0)
EOS ABS: 0.1 10*3/uL (ref 0.0–0.5)
EOS%: 0.8 % (ref 0.0–7.0)
HCT: 28.9 % — ABNORMAL LOW (ref 38.7–49.9)
HEMOGLOBIN: 8.6 g/dL — AB (ref 13.0–17.1)
LYMPH#: 0.6 10*3/uL — ABNORMAL LOW (ref 0.9–3.3)
LYMPH%: 5.5 % — AB (ref 14.0–48.0)
MCH: 28.4 pg (ref 28.0–33.4)
MCHC: 29.8 g/dL — ABNORMAL LOW (ref 32.0–35.9)
MCV: 95 fL (ref 82–98)
MONO#: 0.8 10*3/uL (ref 0.1–0.9)
MONO%: 7.5 % (ref 0.0–13.0)
NEUT%: 85.7 % — ABNORMAL HIGH (ref 40.0–80.0)
NEUTROS ABS: 9.4 10*3/uL — AB (ref 1.5–6.5)
Platelets: 274 10*3/uL (ref 145–400)
RBC: 3.03 10*6/uL — AB (ref 4.20–5.70)
RDW: 19 % — ABNORMAL HIGH (ref 11.1–15.7)
WBC: 11 10*3/uL — AB (ref 4.0–10.0)

## 2017-02-26 LAB — CMP (CANCER CENTER ONLY)
ALBUMIN: 2.9 g/dL — AB (ref 3.3–5.5)
ALT(SGPT): 34 U/L (ref 10–47)
AST: 37 U/L (ref 11–38)
Alkaline Phosphatase: 95 U/L — ABNORMAL HIGH (ref 26–84)
BUN, Bld: 45 mg/dL — ABNORMAL HIGH (ref 7–22)
CALCIUM: 9 mg/dL (ref 8.0–10.3)
CO2: 26 mEq/L (ref 18–33)
Chloride: 106 mEq/L (ref 98–108)
Creat: 1.7 mg/dl — ABNORMAL HIGH (ref 0.6–1.2)
Glucose, Bld: 116 mg/dL (ref 73–118)
Potassium: 3.9 mEq/L (ref 3.3–4.7)
Sodium: 143 mEq/L (ref 128–145)
TOTAL PROTEIN: 5.8 g/dL — AB (ref 6.4–8.1)
Total Bilirubin: 0.6 mg/dl (ref 0.20–1.60)

## 2017-02-26 LAB — IRON AND TIBC
%SAT: 26 % (ref 20–55)
Iron: 60 ug/dL (ref 42–163)
TIBC: 226 ug/dL (ref 202–409)
UIBC: 166 ug/dL (ref 117–376)

## 2017-02-26 LAB — FERRITIN: Ferritin: 391 ng/ml — ABNORMAL HIGH (ref 22–316)

## 2017-02-26 MED ORDER — DARBEPOETIN ALFA 300 MCG/0.6ML IJ SOSY
PREFILLED_SYRINGE | INTRAMUSCULAR | Status: AC
  Filled 2017-02-26: qty 0.6

## 2017-02-26 MED ORDER — DARBEPOETIN ALFA 300 MCG/0.6ML IJ SOSY
300.0000 ug | PREFILLED_SYRINGE | Freq: Once | INTRAMUSCULAR | Status: AC
  Administered 2017-02-26: 300 ug via SUBCUTANEOUS

## 2017-02-26 NOTE — Patient Instructions (Signed)

## 2017-02-27 ENCOUNTER — Telehealth: Payer: Self-pay | Admitting: *Deleted

## 2017-02-27 LAB — RETICULOCYTES: Reticulocyte Count: 4.4 % — ABNORMAL HIGH (ref 0.6–2.6)

## 2017-02-27 NOTE — Telephone Encounter (Addendum)
Patient's wife aware of results  ----- Message from Josph Macho, MD sent at 02/27/2017  6:15 AM EDT ----- Call - iron level is actually pretty good!!  pete

## 2017-02-27 NOTE — Progress Notes (Signed)
Hematology and Oncology Follow Up Visit  [name] [id number] [birth year] 76 y.o. 02/27/2017   Principle Diagnosis:   Multifactorial anemia-iron deficiency secondary to bleeding, renal insufficiency; patient with a LVAD.  Current Therapy:    Aranesp 300 g subcutaneous every 3 weeks for hemoglobin less than 11  IV iron as indicated     Interim History:  [name] is back for follow-up. We saw him on March 9. He has a very complicated history. His main problem now is congestive heart failure. He has a LVAD pump.  He had been seen at [facility]. However, this is such a long ways for them to go.  When we saw him, he was quite anemic. He needed to be transfused. His iron studies showed a ferritin of 84 with an iron saturation of 5%. He clearly was quite iron deficient. He did get IV iron with Feraheme.  His rhythm point level back in early March was 57.  He actually looks a little better. He has the LVAD pump. This is helping him.  He is not having pain. He's not having problems with bowels or bladder. He's not having any type of rashes.  He does have significant leg swelling.  His appetite is doing okay. He has had no problems with nausea or vomiting.  Overall, his performance status is ECOG 3.  Medications:  Current Outpatient Prescriptions:  .  acetaminophen (TYLENOL) 500 MG tablet, Take 500 mg by mouth every 6 (six) hours as needed., Disp: , Rfl:  .  albuterol (PROVENTIL HFA;VENTOLIN HFA) 108 (90 BASE) MCG/ACT inhaler, Inhale 1-2 puffs into the lungs as needed for wheezing or shortness of breath., Disp: , Rfl:  .  budesonide-formoterol (SYMBICORT) 160-4.5 MCG/ACT inhaler, Inhale 2 puffs into the lungs daily as needed., Disp: , Rfl:  .  calcium carbonate (OS-CAL) 600 MG TABS tablet, Take 600 mg by mouth 2 (two) times daily., Disp: , Rfl:  .  Cholecalciferol (VITAMIN D3) 2000 UNITS TABS, Take 1 tablet by mouth daily., Disp: , Rfl:  .  colchicine 0.6 MG tablet,  Take 0.6 mg by mouth daily., Disp: , Rfl:  .  diclofenac sodium (VOLTAREN) 1 % GEL, Apply topically., Disp: , Rfl:  .  hydroxychloroquine (PLAQUENIL) 200 MG tablet, Take by mouth daily. 2 tabs with breakfast, Disp: , Rfl:  .  Melatonin 3-10 MG TABS, Take 3 mg by mouth at bedtime as needed., Disp: , Rfl:  .  metoprolol tartrate (LOPRESSOR) 25 MG tablet, Take 25 mg by mouth 2 (two) times daily., Disp: , Rfl:  .  Mirtazapine (REMERON PO), Take by mouth at bedtime., Disp: , Rfl:  .  Multiple Vitamin (MULTIVITAMIN) tablet, Take 1 tablet by mouth daily., Disp: , Rfl:  .  pantoprazole (PROTONIX) 40 MG tablet, Take 40 mg by mouth daily., Disp: , Rfl:  .  pravastatin (PRAVACHOL) 40 MG tablet, Take one by mouth daily, Disp: , Rfl:  .  tamsulosin (FLOMAX) 0.4 MG CAPS capsule, Take 0.4 mg by mouth daily., Disp: , Rfl:  .  torsemide (DEMADEX) 20 MG tablet, Take 40 mg by mouth every morning. May take an extra tab in the evening as needed., Disp: , Rfl:  .  ULORIC 80 MG TABS, , Disp: , Rfl:  .  VENLAFAXINE HCL PO, Take by mouth., Disp: , Rfl:  .  warfarin (COUMADIN) 2 MG tablet, Take 2-4 mg by mouth as directed. Managed by [facility] Patient checks at home, Disp: , Rfl:   Allergies:  Allergies  Allergen Reactions  . Amoxicillin-Pot Clavulanate Rash  . Levofloxacin Other (See Comments)    Doesn't tolerate  . Oxycodone Hives  . Ace Inhibitors Rash  . Ceftriaxone Rash  . Cefuroxime Rash  . Chlorhexidine Gluconate Rash    H/o recurrent cellulitis and rash   . Clindamycin/Lincomycin Rash    Erythematous drug eruption  . Hydralazine Itching  . Meropenem Rash  . Sulfonamide Derivatives Rash    Mother was highly allergic. Patient untested.   . Vancomycin Rash  . Zosyn [Piperacillin Sod-Tazobactam So] Rash    No other systemic symptoms    Past Medical History, Surgical history, Social history, and Family History were reviewed and updated.  Review of Systems: Head and neck exam shows no ocular or oral  lesions. He has no palpable cervical or supraclavicular lymph nodes. He has some pale conjunctiva. Lungs are with decreased breath sounds throughout all lung fields. Cardiac exam shows a constant pan systolic and diastolic "hum" from his LVAD device. Abdomen is soft. He has decent bowel sounds. There is no fluid wave. There is no palpable liver or spleen. Extremities shows chronic 2+ edema in his legs. Neurological exam shows no focal neurological deficits.  Physical Exam:  weight is 191 lb (86.6 kg). His blood pressure is 92/70 and his pulse is 83. His respiration is 19 and oxygen saturation is 99%.   Wt Readings from Last 3 Encounters:  02/26/17 191 lb (86.6 kg)  07/17/16 205 lb 3.2 oz (93.1 kg)  07/06/16 215 lb 12.8 oz (97.9 kg)     See above  Lab Results  Component Value Date   WBC 11.0 (H) 02/26/2017   HGB 8.6 (L) 02/26/2017   HCT 28.9 (L) 02/26/2017   MCV 95 02/26/2017   PLT 274 02/26/2017     Chemistry      Component Value Date/Time   NA 143 02/26/2017 1115   K 3.9 02/26/2017 1115   CL 106 02/26/2017 1115   CO2 26 02/26/2017 1115   BUN 45 (H) 02/26/2017 1115   CREATININE 1.7 (H) 02/26/2017 1115      Component Value Date/Time   CALCIUM 9.0 02/26/2017 1115   ALKPHOS 95 (H) 02/26/2017 1115   AST 37 02/26/2017 1115   ALT 34 02/26/2017 1115   BILITOT 0.60 02/26/2017 1115         Impression and Plan: [name] is A 76 year old white male. He has a lot of medical issues. His biggest issue is the cardiac failure. He has a LVAD pump.  We will go ahead and give him Aranesp today.  His iron studies today look alright. His ferritin is 391 with an iron saturation of 26%. As such, I don't think he needs iron.  We have to follow him every 3 weeks for right now. It would be nice to get his hemoglobin above 10.    I spent about 30 minutes with he and his wife   Josph Macho, MD 3/28/20185:48 PM

## 2017-02-28 ENCOUNTER — Encounter: Payer: Self-pay | Admitting: Cardiology

## 2017-02-28 NOTE — Progress Notes (Signed)
Remote ICD transmission.   

## 2017-03-01 LAB — CUP PACEART REMOTE DEVICE CHECK
Battery Remaining Longevity: 20 mo
Battery Voltage: 2.92 V
Brady Statistic RA Percent Paced: 0 %
Brady Statistic RV Percent Paced: 76.48 %
Date Time Interrogation Session: 20180322212327
HighPow Impedance: 36 Ohm
HighPow Impedance: 45 Ohm
Implantable Lead Implant Date: 20111227
Implantable Lead Location: 753858
Implantable Lead Location: 753860
Implantable Lead Model: 4076
Implantable Lead Model: 4194
Implantable Pulse Generator Implant Date: 20140917
Lead Channel Impedance Value: 304 Ohm
Lead Channel Impedance Value: 532 Ohm
Lead Channel Sensing Intrinsic Amplitude: 1.125 mV
Lead Channel Sensing Intrinsic Amplitude: 1.625 mV
Lead Channel Setting Pacing Pulse Width: 1 ms
Lead Channel Setting Sensing Sensitivity: 0.3 mV
MDC IDC LEAD IMPLANT DT: 20111227
MDC IDC LEAD IMPLANT DT: 20111227
MDC IDC LEAD LOCATION: 753859
MDC IDC MSMT LEADCHNL LV IMPEDANCE VALUE: 532 Ohm
MDC IDC MSMT LEADCHNL LV IMPEDANCE VALUE: 722 Ohm
MDC IDC MSMT LEADCHNL LV PACING THRESHOLD AMPLITUDE: 1.875 V
MDC IDC MSMT LEADCHNL LV PACING THRESHOLD PULSEWIDTH: 0.4 ms
MDC IDC MSMT LEADCHNL RA IMPEDANCE VALUE: 437 Ohm
MDC IDC MSMT LEADCHNL RA SENSING INTR AMPL: 1.125 mV
MDC IDC MSMT LEADCHNL RV IMPEDANCE VALUE: 589 Ohm
MDC IDC MSMT LEADCHNL RV SENSING INTR AMPL: 1.625 mV
MDC IDC SET LEADCHNL RV PACING AMPLITUDE: 2.5 V
MDC IDC STAT BRADY AP VP PERCENT: 0 %
MDC IDC STAT BRADY AP VS PERCENT: 0 %
MDC IDC STAT BRADY AS VP PERCENT: 80.13 %
MDC IDC STAT BRADY AS VS PERCENT: 19.87 %

## 2017-03-18 ENCOUNTER — Telehealth: Payer: Self-pay

## 2017-03-18 NOTE — Telephone Encounter (Signed)
Pt called to schedule next epo shot. Called pt back with schedule.

## 2017-03-20 ENCOUNTER — Other Ambulatory Visit: Payer: Medicare Other

## 2017-03-20 ENCOUNTER — Ambulatory Visit: Payer: Medicare Other

## 2017-03-20 ENCOUNTER — Ambulatory Visit: Payer: Medicare Other | Admitting: Family

## 2017-03-22 ENCOUNTER — Ambulatory Visit (HOSPITAL_BASED_OUTPATIENT_CLINIC_OR_DEPARTMENT_OTHER): Payer: Medicare Other | Admitting: Family

## 2017-03-22 ENCOUNTER — Ambulatory Visit (HOSPITAL_BASED_OUTPATIENT_CLINIC_OR_DEPARTMENT_OTHER): Payer: Medicare Other

## 2017-03-22 ENCOUNTER — Other Ambulatory Visit (HOSPITAL_BASED_OUTPATIENT_CLINIC_OR_DEPARTMENT_OTHER): Payer: Medicare Other

## 2017-03-22 VITALS — BP 54/0 | HR 51 | Temp 97.9°F | Resp 17 | Wt 188.0 lb

## 2017-03-22 DIAGNOSIS — K922 Gastrointestinal hemorrhage, unspecified: Secondary | ICD-10-CM

## 2017-03-22 DIAGNOSIS — K909 Intestinal malabsorption, unspecified: Secondary | ICD-10-CM

## 2017-03-22 DIAGNOSIS — I509 Heart failure, unspecified: Secondary | ICD-10-CM | POA: Diagnosis not present

## 2017-03-22 DIAGNOSIS — D500 Iron deficiency anemia secondary to blood loss (chronic): Secondary | ICD-10-CM

## 2017-03-22 DIAGNOSIS — D631 Anemia in chronic kidney disease: Secondary | ICD-10-CM

## 2017-03-22 DIAGNOSIS — N189 Chronic kidney disease, unspecified: Secondary | ICD-10-CM | POA: Diagnosis present

## 2017-03-22 DIAGNOSIS — N184 Chronic kidney disease, stage 4 (severe): Secondary | ICD-10-CM

## 2017-03-22 DIAGNOSIS — D6804 Acquired von Willebrand disease: Secondary | ICD-10-CM

## 2017-03-22 DIAGNOSIS — D680 Von Willebrand's disease: Secondary | ICD-10-CM

## 2017-03-22 DIAGNOSIS — Z95811 Presence of heart assist device: Secondary | ICD-10-CM

## 2017-03-22 DIAGNOSIS — N289 Disorder of kidney and ureter, unspecified: Secondary | ICD-10-CM

## 2017-03-22 DIAGNOSIS — Z7901 Long term (current) use of anticoagulants: Secondary | ICD-10-CM

## 2017-03-22 LAB — CBC WITH DIFFERENTIAL (CANCER CENTER ONLY)
BASO#: 0 10*3/uL (ref 0.0–0.2)
BASO%: 0.2 % (ref 0.0–2.0)
EOS%: 0 % (ref 0.0–7.0)
Eosinophils Absolute: 0 10*3/uL (ref 0.0–0.5)
HCT: 29.4 % — ABNORMAL LOW (ref 38.7–49.9)
HGB: 8.5 g/dL — ABNORMAL LOW (ref 13.0–17.1)
LYMPH#: 0.5 10*3/uL — AB (ref 0.9–3.3)
LYMPH%: 5.6 % — AB (ref 14.0–48.0)
MCH: 28.5 pg (ref 28.0–33.4)
MCHC: 28.9 g/dL — AB (ref 32.0–35.9)
MCV: 99 fL — ABNORMAL HIGH (ref 82–98)
MONO#: 0.5 10*3/uL (ref 0.1–0.9)
MONO%: 6 % (ref 0.0–13.0)
NEUT#: 7.7 10*3/uL — ABNORMAL HIGH (ref 1.5–6.5)
NEUT%: 88.2 % — AB (ref 40.0–80.0)
PLATELETS: 229 10*3/uL (ref 145–400)
RBC: 2.98 10*6/uL — ABNORMAL LOW (ref 4.20–5.70)
RDW: 20.7 % — AB (ref 11.1–15.7)
WBC: 8.8 10*3/uL (ref 4.0–10.0)

## 2017-03-22 LAB — IRON AND TIBC
%SAT: 14 % — ABNORMAL LOW (ref 20–55)
Iron: 37 ug/dL — ABNORMAL LOW (ref 42–163)
TIBC: 260 ug/dL (ref 202–409)
UIBC: 223 ug/dL (ref 117–376)

## 2017-03-22 LAB — CMP (CANCER CENTER ONLY)
ALK PHOS: 67 U/L (ref 26–84)
ALT: 19 U/L (ref 10–47)
AST: 23 U/L (ref 11–38)
Albumin: 2.9 g/dL — ABNORMAL LOW (ref 3.3–5.5)
BUN: 79 mg/dL — AB (ref 7–22)
CO2: 29 meq/L (ref 18–33)
CREATININE: 2.4 mg/dL — AB (ref 0.6–1.2)
Calcium: 8.7 mg/dL (ref 8.0–10.3)
Chloride: 104 mEq/L (ref 98–108)
GLUCOSE: 165 mg/dL — AB (ref 73–118)
Potassium: 4.3 mEq/L (ref 3.3–4.7)
SODIUM: 143 meq/L (ref 128–145)
Total Bilirubin: 0.7 mg/dl (ref 0.20–1.60)
Total Protein: 5.8 g/dL — ABNORMAL LOW (ref 6.4–8.1)

## 2017-03-22 LAB — FERRITIN: Ferritin: 89 ng/ml (ref 22–316)

## 2017-03-22 MED ORDER — DARBEPOETIN ALFA 300 MCG/0.6ML IJ SOSY
300.0000 ug | PREFILLED_SYRINGE | Freq: Once | INTRAMUSCULAR | Status: AC
  Administered 2017-03-22: 300 ug via SUBCUTANEOUS

## 2017-03-22 MED ORDER — DARBEPOETIN ALFA 300 MCG/0.6ML IJ SOSY
PREFILLED_SYRINGE | INTRAMUSCULAR | Status: AC
  Filled 2017-03-22: qty 0.6

## 2017-03-22 NOTE — Progress Notes (Signed)
Hematology and Oncology Follow Up Visit  [name] [id number] 01-Jan-1941 76 y.o. 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 He is feeling quite fatigued today. He states that he is SOB with any exertion which is not a new issue for him. O2 sat on RA is 95%.  His Hgb is 8.5 with an MCV of 99. Iron saturation is a bit low at 14%.  He has had no issue with his LVAD and states that in May he will have had it for 5 years. There is a constant systolic and diastolic hum with the device. He states that he will be having his ICD interrogated soon.  No episodes of bleeding. He does bruise easily on Coumadin.  He had a recent bout with gout in his feet and has been taking Uloric and Cholchicine. He has also taken Prednisone sparingly in small amounts to help with swelling.  He has had no problem with infection. No fever, chills, n/v, cough, rash, dizziness, chest pain, palpitations, abdominal pain or changes in bowel or bladder habits.  No tenderness in his extremities. He has intermitted swelling in his legs and takes Demedex 40 mg PO daily.  The neuropathy in his feet is unchanged.  He is eating well and staying hydrated. His weight is stable.   ECOG Performance Status: 3 - Symptomatic, >50% confined to bed   Medications:  Allergies as of 03/22/2017      Reactions   Amoxicillin-pot Clavulanate Rash   Levofloxacin Other (See Comments)   Doesn't tolerate   Oxycodone Hives   Ace Inhibitors Rash   Ceftriaxone Rash   Cefuroxime Rash   Chlorhexidine Gluconate Rash   H/o recurrent cellulitis and rash    Clindamycin/lincomycin Rash   Erythematous drug eruption   Hydralazine Itching   Meropenem Rash   Sulfonamide Derivatives Rash   Mother was highly allergic. Patient untested.    Vancomycin Rash   Zosyn [piperacillin Sod-tazobactam So] Rash   No other systemic symptoms      Medication List       Accurate as of 03/22/17  5:03 PM. Always use your most recent med list.          acetaminophen 500 MG tablet Commonly known as:  TYLENOL Take 500 mg by mouth every 6 (six) hours as needed.   albuterol 108 (90 Base) MCG/ACT inhaler Commonly known as:  PROVENTIL HFA;VENTOLIN HFA Inhale 1-2 puffs into the lungs as needed for wheezing or shortness of breath.   budesonide-formoterol 160-4.5 MCG/ACT inhaler Commonly known as:  SYMBICORT Inhale 2 puffs into the lungs daily as needed.   calcium carbonate 600 MG Tabs tablet Commonly known as:  OS-CAL Take 600 mg by mouth 2 (two) times daily.   colchicine 0.6 MG tablet Take 0.6 mg by mouth daily.   Darbepoetin Alfa 100 MCG/0.5ML Sosy injection Commonly known as:  ARANESP Inject into the skin.   diclofenac sodium 1 % Gel Commonly known as:  VOLTAREN Apply topically.   ferrous sulfate 325 (65 FE) MG tablet Take by mouth.   hydroxychloroquine 200 MG tablet Commonly known as:  PLAQUENIL Take by mouth daily. 2 tabs with breakfast   Melatonin 3-10 MG Tabs Take 3 mg by mouth  at bedtime as needed.   metoprolol tartrate 25 MG tablet Commonly known as:  LOPRESSOR Take 25 mg by mouth 2 (two) times daily.   multivitamin tablet Take 1 tablet by mouth daily.   pantoprazole 40 MG tablet Commonly known as:  PROTONIX Take 40 mg by mouth daily.   polyethylene glycol packet Commonly known as:  MIRALAX / GLYCOLAX Take by mouth.   potassium chloride SA 20 MEQ tablet Commonly known as:  K-DUR,KLOR-CON Take by mouth.   pravastatin 40 MG tablet Commonly known as:  PRAVACHOL Take one by mouth daily   predniSONE 10 MG tablet Commonly known as:  DELTASONE   REMERON PO Take by mouth at bedtime.   tamsulosin 0.4 MG Caps  capsule Commonly known as:  FLOMAX Take 0.4 mg by mouth daily.   torsemide 20 MG tablet Commonly known as:  DEMADEX Take 40 mg by mouth every morning. May take an extra tab in the evening as needed.   ULORIC 80 MG Tabs Generic drug:  Febuxostat   Vitamin D3 2000 units Tabs Take 1 tablet by mouth daily.   warfarin 2 MG tablet Commonly known as:  COUMADIN Take 2-4 mg by mouth as directed. Managed by [facility] Patient checks at home       Allergies:  Allergies  Allergen Reactions  . Amoxicillin-Pot Clavulanate Rash  . Levofloxacin Other (See Comments)    Doesn't tolerate  . Oxycodone Hives  . Ace Inhibitors Rash  . Ceftriaxone Rash  . Cefuroxime Rash  . Chlorhexidine Gluconate Rash    H/o recurrent cellulitis and rash   . Clindamycin/Lincomycin Rash    Erythematous drug eruption  . Hydralazine Itching  . Meropenem Rash  . Sulfonamide Derivatives Rash    Mother was highly allergic. Patient untested.   . Vancomycin Rash  . Zosyn [Piperacillin Sod-Tazobactam So] Rash    No other systemic symptoms    Past Medical History, Surgical history, Social history, and Family History were reviewed and updated.  Review of Systems: All other 10 point review of systems is negative.   Physical Exam:  weight is 188 lb (85.3 kg). His oral temperature is 97.9 F (36.6 C). His blood pressure is 54/0 (abnormal) and his pulse is 51 (abnormal). His respiration is 17 and oxygen saturation is 95%.   Wt Readings from Last 3 Encounters:  03/22/17 188 lb (85.3 kg)  02/26/17 191 lb (86.6 kg)  07/17/16 205 lb 3.2 oz (93.1 kg)    Ocular: Sclerae unicteric, pupils equal, round and reactive to light Ear-nose-throat: Oropharynx clear, dentition fair Lymphatic: No cervical, supraclavicular or axillary adenopathy Lungs no rales or rhonchi, good excursion bilaterally Heart regular rate and rhythm, has LVAD with constant systolic and diastolic hum Abd soft, nontender, positive bowel sounds, no liver  or spleen tip palpated on exam, no fluid wave MSK no focal spinal tenderness, no joint edema Neuro: non-focal, well-oriented, appropriate affect Breasts: Deferred   Lab Results  Component Value Date   WBC 8.8 03/22/2017   HGB 8.5 (L) 03/22/2017   HCT 29.4 (L) 03/22/2017   MCV 99 (H) 03/22/2017   PLT 229 03/22/2017   Lab Results  Component Value Date   FERRITIN 89 03/22/2017   IRON 37 (L) 03/22/2017   TIBC 260 03/22/2017   UIBC 223 03/22/2017   IRONPCTSAT 14 (L) 03/22/2017   Lab Results  Component Value Date   RBC 2.98 (L) 03/22/2017   No results found for: KPAFRELGTCHN, LAMBDASER, KAPLAMBRATIO No results found for: IGGSERUM,  IGA, IGMSERUM No results found for: Marda Stalker, SPEI   Chemistry      Component Value Date/Time   NA 143 03/22/2017 1110   K 4.3 03/22/2017 1110   CL 104 03/22/2017 1110   CO2 29 03/22/2017 1110   BUN 79 (H) 03/22/2017 1110   CREATININE 2.4 (H) 03/22/2017 1110      Component Value Date/Time   CALCIUM 8.7 03/22/2017 1110   ALKPHOS 67 03/22/2017 1110   AST 23 03/22/2017 1110   ALT 19 03/22/2017 1110   BILITOT 0.70 03/22/2017 1110      Impression and Plan: Mr. Nicolini is a pleasant 76 yo caucasian male with multiple health issues. He has heart failure (with an LVAD) which contributed to his multifactorial anemia. He is still symptomatic with fatigue and SOB with exertion. He takes breaks to rest as needed.  Hgb is 8.5 so we will give him Aranesp today.  His iron saturation was low at 14% so we will plan to give him IV iron next week.  I spent at least 25 minutes face to face counseling the patient and his wife.  We will plan to see him back in 3 weeks for repeat lab work and follow-up.  They will contact our office with any questions or concerns. We can certainly see him sooner if need be.  Verdie Mosher, NP 4/20/20185:03 PM

## 2017-03-22 NOTE — Patient Instructions (Signed)

## 2017-03-26 ENCOUNTER — Telehealth: Payer: Self-pay | Admitting: *Deleted

## 2017-03-26 ENCOUNTER — Other Ambulatory Visit: Payer: Self-pay | Admitting: Family

## 2017-03-26 ENCOUNTER — Ambulatory Visit (HOSPITAL_BASED_OUTPATIENT_CLINIC_OR_DEPARTMENT_OTHER): Payer: Medicare Other

## 2017-03-26 VITALS — BP 90/60 | HR 50 | Resp 20

## 2017-03-26 DIAGNOSIS — N184 Chronic kidney disease, stage 4 (severe): Secondary | ICD-10-CM

## 2017-03-26 DIAGNOSIS — K909 Intestinal malabsorption, unspecified: Secondary | ICD-10-CM

## 2017-03-26 DIAGNOSIS — D500 Iron deficiency anemia secondary to blood loss (chronic): Secondary | ICD-10-CM | POA: Diagnosis present

## 2017-03-26 DIAGNOSIS — D631 Anemia in chronic kidney disease: Secondary | ICD-10-CM

## 2017-03-26 DIAGNOSIS — K922 Gastrointestinal hemorrhage, unspecified: Secondary | ICD-10-CM

## 2017-03-26 MED ORDER — SODIUM CHLORIDE 0.9 % IV SOLN
Freq: Once | INTRAVENOUS | Status: AC
  Administered 2017-03-26: 11:00:00 via INTRAVENOUS

## 2017-03-26 MED ORDER — SODIUM CHLORIDE 0.9 % IV SOLN
510.0000 mg | Freq: Once | INTRAVENOUS | Status: AC
  Administered 2017-03-26: 510 mg via INTRAVENOUS
  Filled 2017-03-26: qty 17

## 2017-03-26 NOTE — Telephone Encounter (Addendum)
-----   Message from Josph Macho, MD sent at 03/24/2017  8:23 PM EDT ----- Call his wife - iron is low again!!!   Need to give him Feraheme prior to his next Aranesp appt!!!  pete Patient to get iron today 03/26/17

## 2017-03-26 NOTE — Patient Instructions (Signed)

## 2017-04-12 ENCOUNTER — Other Ambulatory Visit (HOSPITAL_BASED_OUTPATIENT_CLINIC_OR_DEPARTMENT_OTHER): Payer: Medicare Other

## 2017-04-12 ENCOUNTER — Ambulatory Visit: Payer: Medicare Other

## 2017-04-12 ENCOUNTER — Ambulatory Visit (HOSPITAL_BASED_OUTPATIENT_CLINIC_OR_DEPARTMENT_OTHER): Payer: Medicare Other | Admitting: Hematology & Oncology

## 2017-04-12 ENCOUNTER — Other Ambulatory Visit: Payer: Self-pay | Admitting: Family

## 2017-04-12 ENCOUNTER — Ambulatory Visit (HOSPITAL_BASED_OUTPATIENT_CLINIC_OR_DEPARTMENT_OTHER): Payer: Medicare Other

## 2017-04-12 ENCOUNTER — Ambulatory Visit (HOSPITAL_COMMUNITY)
Admission: RE | Admit: 2017-04-12 | Discharge: 2017-04-12 | Disposition: A | Discharge status: Home / Self Care | Payer: Medicare Other | Source: Ambulatory Visit | Attending: Hematology & Oncology | Admitting: Hematology & Oncology

## 2017-04-12 VITALS — BP 78/51 | HR 63 | Temp 98.9°F | Resp 16 | Wt 191.8 lb

## 2017-04-12 DIAGNOSIS — K922 Gastrointestinal hemorrhage, unspecified: Secondary | ICD-10-CM

## 2017-04-12 DIAGNOSIS — D500 Iron deficiency anemia secondary to blood loss (chronic): Secondary | ICD-10-CM | POA: Insufficient documentation

## 2017-04-12 DIAGNOSIS — K2901 Acute gastritis with bleeding: Secondary | ICD-10-CM | POA: Diagnosis present

## 2017-04-12 DIAGNOSIS — D631 Anemia in chronic kidney disease: Secondary | ICD-10-CM

## 2017-04-12 DIAGNOSIS — I509 Heart failure, unspecified: Secondary | ICD-10-CM

## 2017-04-12 DIAGNOSIS — K909 Intestinal malabsorption, unspecified: Secondary | ICD-10-CM

## 2017-04-12 DIAGNOSIS — N184 Chronic kidney disease, stage 4 (severe): Secondary | ICD-10-CM

## 2017-04-12 DIAGNOSIS — D6804 Acquired von Willebrand disease: Secondary | ICD-10-CM

## 2017-04-12 DIAGNOSIS — D680 Von Willebrand's disease: Secondary | ICD-10-CM | POA: Diagnosis not present

## 2017-04-12 DIAGNOSIS — N189 Chronic kidney disease, unspecified: Secondary | ICD-10-CM

## 2017-04-12 DIAGNOSIS — Z7901 Long term (current) use of anticoagulants: Secondary | ICD-10-CM | POA: Diagnosis not present

## 2017-04-12 DIAGNOSIS — N289 Disorder of kidney and ureter, unspecified: Secondary | ICD-10-CM

## 2017-04-12 DIAGNOSIS — Z95811 Presence of heart assist device: Secondary | ICD-10-CM

## 2017-04-12 LAB — CBC WITH DIFFERENTIAL (CANCER CENTER ONLY)
BASO#: 0 10*3/uL (ref 0.0–0.2)
BASO%: 0.2 % (ref 0.0–2.0)
EOS ABS: 0 10*3/uL (ref 0.0–0.5)
EOS%: 0.4 % (ref 0.0–7.0)
HEMATOCRIT: 29.5 % — AB (ref 38.7–49.9)
HEMOGLOBIN: 8.7 g/dL — AB (ref 13.0–17.1)
LYMPH#: 0.2 10*3/uL — AB (ref 0.9–3.3)
LYMPH%: 2.3 % — ABNORMAL LOW (ref 14.0–48.0)
MCH: 28.9 pg (ref 28.0–33.4)
MCHC: 29.5 g/dL — AB (ref 32.0–35.9)
MCV: 98 fL (ref 82–98)
MONO#: 0.4 10*3/uL (ref 0.1–0.9)
MONO%: 3.4 % (ref 0.0–13.0)
NEUT%: 93.7 % — ABNORMAL HIGH (ref 40.0–80.0)
NEUTROS ABS: 9.9 10*3/uL — AB (ref 1.5–6.5)
Platelets: 204 10*3/uL (ref 145–400)
RBC: 3.01 10*6/uL — AB (ref 4.20–5.70)
RDW: 21.5 % — ABNORMAL HIGH (ref 11.1–15.7)
WBC: 10.6 10*3/uL — AB (ref 4.0–10.0)

## 2017-04-12 LAB — CMP (CANCER CENTER ONLY)
ALBUMIN: 2.5 g/dL — AB (ref 3.3–5.5)
ALK PHOS: 94 U/L — AB (ref 26–84)
ALT: 22 U/L (ref 10–47)
AST: 21 U/L (ref 11–38)
BILIRUBIN TOTAL: 0.7 mg/dL (ref 0.20–1.60)
BUN, Bld: 95 mg/dL — ABNORMAL HIGH (ref 7–22)
CALCIUM: 8.9 mg/dL (ref 8.0–10.3)
CO2: 24 mEq/L (ref 18–33)
Chloride: 105 mEq/L (ref 98–108)
Creat: 2.4 mg/dl — ABNORMAL HIGH (ref 0.6–1.2)
Glucose, Bld: 191 mg/dL — ABNORMAL HIGH (ref 73–118)
Potassium: 4 mEq/L (ref 3.3–4.7)
Sodium: 137 mEq/L (ref 128–145)
Total Protein: 5.5 g/dL — ABNORMAL LOW (ref 6.4–8.1)

## 2017-04-12 MED ORDER — DARBEPOETIN ALFA 300 MCG/0.6ML IJ SOSY
PREFILLED_SYRINGE | INTRAMUSCULAR | Status: AC
  Filled 2017-04-12: qty 0.6

## 2017-04-12 MED ORDER — DARBEPOETIN ALFA 300 MCG/0.6ML IJ SOSY
300.0000 ug | PREFILLED_SYRINGE | Freq: Once | INTRAMUSCULAR | Status: AC
  Administered 2017-04-12: 300 ug via SUBCUTANEOUS

## 2017-04-12 NOTE — Progress Notes (Signed)
Hematology and Oncology Follow Up Visit  [name] [id number] Oct 18, 1941 76 y.o. 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  Current Therapy:    Aranesp 300 g subcutaneous every 3 weeks for hemoglobin less than 11  IV iron as indicated     Interim History:  [name] is back for follow-up. He is not feeling as well. He says he's had "dark stools" this past week. Last time he had "dark stools" was Wednesday.  He feels a little congested. He does have issues with this. He sees cardiology on Tuesday.  Back in April, his iron studies did show some iron deficiency. His ferritin was only 89. His iron saturation was 14%. For [name], any ferritin less than 100 typically indicates iron deficiency.  He's had no fever.  He's had leg swelling which appears to be chronic.  He's had no bladder issues. His been no incontinence.  His appetite is doing okay. He has had no problems with nausea or vomiting.  Overall, his performance status is ECOG 3.  Medications:  Current Outpatient Prescriptions:  .  acetaminophen (TYLENOL) 500 MG tablet, Take 500 mg by mouth every 6 (six) hours as needed., Disp: , Rfl:  .  albuterol (PROVENTIL HFA;VENTOLIN HFA) 108 (90 BASE) MCG/ACT inhaler, Inhale 1-2 puffs into the lungs as needed for wheezing or shortness of breath., Disp: , Rfl:  .  budesonide-formoterol (SYMBICORT) 160-4.5 MCG/ACT inhaler, Inhale 2 puffs into the lungs daily as needed., Disp: , Rfl:  .  calcium carbonate (OS-CAL) 600 MG TABS tablet, Take 600 mg by mouth 2 (two) times daily., Disp: , Rfl:  .  Cholecalciferol (VITAMIN D3) 2000 UNITS TABS, Take 1 tablet by mouth daily., Disp: , Rfl:  .  colchicine 0.6 MG tablet, Take 0.6 mg by mouth daily., Disp: , Rfl:  .  Darbepoetin Alfa (ARANESP) 100 MCG/0.5ML SOSY injection, Inject into the skin., Disp: , Rfl:  .  diclofenac sodium (VOLTAREN)  1 % GEL, Apply topically., Disp: , Rfl:  .  ferrous sulfate 325 (65 FE) MG tablet, Take by mouth., Disp: , Rfl:  .  hydroxychloroquine (PLAQUENIL) 200 MG tablet, Take by mouth daily. 2 tabs with breakfast, Disp: , Rfl:  .  Melatonin 3-10 MG TABS, Take 3 mg by mouth at bedtime as needed., Disp: , Rfl:  .  metoprolol tartrate (LOPRESSOR) 25 MG tablet, Take 25 mg by mouth 2 (two) times daily., Disp: , Rfl:  .  Mirtazapine (REMERON PO), Take by mouth at bedtime., Disp: , Rfl:  .  Multiple Vitamin (MULTIVITAMIN) tablet, Take 1 tablet by mouth daily., Disp: , Rfl:  .  pantoprazole (PROTONIX) 40 MG tablet, Take 40 mg by mouth daily., Disp: , Rfl:  .  polyethylene glycol (MIRALAX / GLYCOLAX) packet, Take by mouth., Disp: , Rfl:  .  potassium chloride SA (K-DUR,KLOR-CON) 20 MEQ tablet, Take by mouth., Disp: , Rfl:  .  pravastatin (PRAVACHOL) 40 MG tablet, Take one by mouth daily, Disp: , Rfl:  .  predniSONE (DELTASONE) 10 MG tablet, , Disp: , Rfl:  .  silver sulfADIAZINE (SILVADENE) 1 % cream, APPLY EXTERNALLY TWICE A DAY, Disp: , Rfl: 0 .  tamsulosin (FLOMAX) 0.4 MG CAPS capsule, Take 0.4 mg by mouth daily., Disp: , Rfl:  .  torsemide (DEMADEX) 20 MG tablet, Take 40 mg by mouth every morning. May take an extra tab in the evening as needed., Disp: , Rfl:  .  ULORIC 80 MG TABS, , Disp: , Rfl:  .  warfarin (COUMADIN) 2 MG tablet, Take 2-4 mg by mouth as directed. Managed by [facility] Patient checks at home, Disp: , Rfl:   Allergies:  Allergies  Allergen Reactions  . Amoxicillin-Pot Clavulanate Rash  . Levofloxacin Other (See Comments)    Doesn't tolerate  . Oxycodone Hives  . Ace Inhibitors Rash  . Ceftriaxone Rash  . Cefuroxime Rash  . Chlorhexidine Gluconate Rash    H/o recurrent cellulitis and rash   . Clindamycin/Lincomycin Rash    Erythematous drug eruption  . Hydralazine Itching  . Meropenem Rash  . Sulfonamide Derivatives Rash    Mother was highly allergic. Patient untested.   . Vancomycin  Rash  . Zosyn [Piperacillin Sod-Tazobactam So] Rash    No other systemic symptoms    Past Medical History, Surgical history, Social history, and Family History were reviewed and updated.  Review of Systems: Head and neck exam shows no ocular or oral lesions. He has no palpable cervical or supraclavicular lymph nodes. He has some pale conjunctiva. Lungs are with decreased breath sounds throughout all lung fields. Cardiac exam shows a constant pan systolic and diastolic "hum" from his LVAD device. Abdomen is soft. He has decent bowel sounds. There is no fluid wave. There is no palpable liver or spleen.Rectal exam shows no mass in the rectal vault. Stool is brown but grossly heme positive. Extremities shows chronic 2+ edema in his legs. Neurological exam shows no focal neurological deficits.  Physical Exam:  weight is 191 lb 12.8 oz (87 kg). His oral temperature is 98.9 F (37.2 C). His blood pressure is 78/51 (abnormal) and his pulse is 63. His respiration is 16 and oxygen saturation is 96%.   Wt Readings from Last 3 Encounters:  04/12/17 191 lb 12.8 oz (87 kg)  03/22/17 188 lb (85.3 kg)  02/26/17 191 lb (86.6 kg)     See above  Lab Results  Component Value Date   WBC 10.6 (H) 04/12/2017   HGB 8.7 (L) 04/12/2017   HCT 29.5 (L) 04/12/2017   MCV 98 04/12/2017   PLT 204 04/12/2017     Chemistry      Component Value Date/Time   NA 137 04/12/2017 1333   K 4.0 04/12/2017 1333   CL 105 04/12/2017 1333   CO2 24 04/12/2017 1333   BUN 95 (H) 04/12/2017 1333   CREATININE 2.4 (H) 04/12/2017 1333      Component Value Date/Time   CALCIUM 8.9 04/12/2017 1333   ALKPHOS 94 (H) 04/12/2017 1333   AST 21 04/12/2017 1333   ALT 22 04/12/2017 1333   BILITOT 0.70 04/12/2017 1333         Impression and Plan: [name] is A 76 year old white male. He has a lot of medical issues. His biggest issue is the cardiac failure. He has a LVAD pump.  He is having some lower GI bleeding. I suppose  this is no real surprise given that he is on Coumadin.  We cannot stop his Coumadin because of his LVAD. He will see the cardiologist on Tuesday, May 15.  He does need to be transfused from my point of view. I will give him 1 unit of blood. This I think will help him out.  We will go ahead and give him Aranesp today. We will see what his iron studies show.  We have to follow him every 3 weeks for right now. It would be nice to get his  hemoglobin above 10.    I spent about 30 minutes with he and his wife   Josph Macho, MD 5/11/20185:44 PM

## 2017-04-12 NOTE — Patient Instructions (Signed)

## 2017-04-15 ENCOUNTER — Ambulatory Visit (HOSPITAL_BASED_OUTPATIENT_CLINIC_OR_DEPARTMENT_OTHER): Payer: Medicare Other

## 2017-04-15 DIAGNOSIS — D680 Von Willebrand's disease: Secondary | ICD-10-CM

## 2017-04-15 DIAGNOSIS — D500 Iron deficiency anemia secondary to blood loss (chronic): Secondary | ICD-10-CM

## 2017-04-15 DIAGNOSIS — D6804 Acquired von Willebrand disease: Secondary | ICD-10-CM

## 2017-04-15 LAB — IRON AND TIBC
%SAT: 7 % — ABNORMAL LOW (ref 20–55)
IRON: 13 ug/dL — AB (ref 42–163)
TIBC: 190 ug/dL — AB (ref 202–409)
UIBC: 177 ug/dL (ref 117–376)

## 2017-04-15 LAB — FERRITIN: Ferritin: 167 ng/ml (ref 22–316)

## 2017-04-15 LAB — PREPARE RBC (CROSSMATCH)

## 2017-04-15 MED ORDER — ACETAMINOPHEN 325 MG PO TABS
650.0000 mg | ORAL_TABLET | Freq: Once | ORAL | Status: AC
  Administered 2017-04-15: 650 mg via ORAL

## 2017-04-15 MED ORDER — ACETAMINOPHEN 325 MG PO TABS
ORAL_TABLET | ORAL | Status: AC
  Filled 2017-04-15: qty 2

## 2017-04-15 MED ORDER — DIPHENHYDRAMINE HCL 25 MG PO CAPS
ORAL_CAPSULE | ORAL | Status: AC
  Filled 2017-04-15: qty 1

## 2017-04-15 MED ORDER — SODIUM CHLORIDE 0.9 % IV SOLN
250.0000 mL | Freq: Once | INTRAVENOUS | Status: AC
  Administered 2017-04-15: 250 mL via INTRAVENOUS

## 2017-04-15 MED ORDER — DIPHENHYDRAMINE HCL 25 MG PO CAPS
25.0000 mg | ORAL_CAPSULE | Freq: Once | ORAL | Status: AC
  Administered 2017-04-15: 25 mg via ORAL

## 2017-04-15 NOTE — Patient Instructions (Signed)
Blood Transfusion , Adult A blood transfusion is a procedure in which you receive donated blood, including plasma, platelets, and red blood cells, through an IV tube. You may need a blood transfusion because of illness, surgery, or injury. The blood may come from a donor. You may also be able to donate blood for yourself (autologous blood donation) before a surgery if you know that you might require a blood transfusion. The blood given in a transfusion is made up of different types of cells. You may receive:  Red blood cells. These carry oxygen to the cells in the body.  White blood cells. These help you fight infections.  Platelets. These help your blood to clot.  Plasma. This is the liquid part of your blood and it helps with fluid imbalances. If you have hemophilia or another clotting disorder, you may also receive other types of blood products. Tell a health care provider about:  Any allergies you have.  All medicines you are taking, including vitamins, herbs, eye drops, creams, and over-the-counter medicines.  Any problems you or family members have had with anesthetic medicines.  Any blood disorders you have.  Any surgeries you have had.  Any medical conditions you have, including any recent fever or cold symptoms.  Whether you are pregnant or may be pregnant.  Any previous reactions you have had during a blood transfusion. What are the risks? Generally, this is a safe procedure. However, problems may occur, including:  Having an allergic reaction to something in the donated blood. Hives and itching may be symptoms of this type of reaction.  Fever. This may be a reaction to the white blood cells in the transfused blood. Nausea or chest pain may accompany a fever.  Iron overload. This can happen from having many transfusions.  Transfusion-related acute lung injury (TRALI). This is a rare reaction that causes lung damage. The cause is not known.TRALI can occur within hours  of a transfusion or several days later.  Sudden (acute) or delayed hemolytic reactions. This happens if your blood does not match the cells in your transfusion. Your body's defense system (immune system) may try to attack the new cells. This complication is rare. The symptoms include fever, chills, nausea, and low back pain or chest pain.  Infection or disease transmission. This is rare. What happens before the procedure?  You will have a blood test to determine your blood type. This is necessary to know what kind of blood your body will accept and to match it to the donor blood.  If you are going to have a planned surgery, you may be able to do an autologous blood donation. This may be done in case you need to have a transfusion.  If you have had an allergic reaction to a transfusion in the past, you may be given medicine to help prevent a reaction. This medicine may be given to you by mouth or through an IV tube.  You will have your temperature, blood pressure, and pulse monitored before the transfusion.  Follow instructions from your health care provider about eating and drinking restrictions.  Ask your health care provider about:  Changing or stopping your regular medicines. This is especially important if you are taking diabetes medicines or blood thinners.  Taking medicines such as aspirin and ibuprofen. These medicines can thin your blood. Do not take these medicines before your procedure if your health care provider instructs you not to. What happens during the procedure?  An IV tube will be   inserted into one of your veins.  The bag of donated blood will be attached to your IV tube. The blood will then enter through your vein.  Your temperature, blood pressure, and pulse will be monitored regularly during the transfusion. This monitoring is done to detect early signs of a transfusion reaction.  If you have any signs or symptoms of a reaction, your transfusion will be stopped and  you may be given medicine.  When the transfusion is complete, your IV tube will be removed.  Pressure may be applied to the IV site for a few minutes.  A bandage (dressing) will be applied. The procedure may vary among health care providers and hospitals. What happens after the procedure?  Your temperature, blood pressure, heart rate, breathing rate, and blood oxygen level will be monitored often.  Your blood may be tested to see how you are responding to the transfusion.  You may be warmed with fluids or blankets to maintain a normal body temperature. Summary  A blood transfusion is a procedure in which you receive donated blood, including plasma, platelets, and red blood cells, through an IV tube.  Your temperature, blood pressure, and pulse will be monitored before, during, and after the transfusion.  Your blood may be tested after the transfusion to see how your body has responded. This information is not intended to replace advice given to you by your health care provider. Make sure you discuss any questions you have with your health care provider. Document Released: 11/16/2000 Document Revised: 08/16/2016 Document Reviewed: 08/16/2016 Elsevier Interactive Patient Education  2017 Elsevier Inc.  

## 2017-04-16 ENCOUNTER — Ambulatory Visit (HOSPITAL_COMMUNITY)
Admission: RE | Admit: 2017-04-16 | Discharge: 2017-04-16 | Disposition: A | Discharge status: Home / Self Care | Payer: Medicare Other | Source: Ambulatory Visit | Attending: Cardiology | Admitting: Cardiology

## 2017-04-16 ENCOUNTER — Other Ambulatory Visit (HOSPITAL_COMMUNITY): Payer: Self-pay | Admitting: *Deleted

## 2017-04-16 ENCOUNTER — Encounter (HOSPITAL_COMMUNITY): Payer: Self-pay

## 2017-04-16 VITALS — BP 93/69 | HR 62 | Resp 16 | Ht 67.0 in | Wt 196.0 lb

## 2017-04-16 DIAGNOSIS — I482 Chronic atrial fibrillation, unspecified: Secondary | ICD-10-CM

## 2017-04-16 DIAGNOSIS — I130 Hypertensive heart and chronic kidney disease with heart failure and stage 1 through stage 4 chronic kidney disease, or unspecified chronic kidney disease: Secondary | ICD-10-CM | POA: Insufficient documentation

## 2017-04-16 DIAGNOSIS — Z8673 Personal history of transient ischemic attack (TIA), and cerebral infarction without residual deficits: Secondary | ICD-10-CM | POA: Insufficient documentation

## 2017-04-16 DIAGNOSIS — D631 Anemia in chronic kidney disease: Secondary | ICD-10-CM | POA: Diagnosis not present

## 2017-04-16 DIAGNOSIS — Z95811 Presence of heart assist device: Secondary | ICD-10-CM | POA: Diagnosis present

## 2017-04-16 DIAGNOSIS — D500 Iron deficiency anemia secondary to blood loss (chronic): Secondary | ICD-10-CM | POA: Diagnosis not present

## 2017-04-16 DIAGNOSIS — R0602 Shortness of breath: Secondary | ICD-10-CM

## 2017-04-16 DIAGNOSIS — Z86718 Personal history of other venous thrombosis and embolism: Secondary | ICD-10-CM | POA: Insufficient documentation

## 2017-04-16 DIAGNOSIS — F419 Anxiety disorder, unspecified: Secondary | ICD-10-CM | POA: Diagnosis not present

## 2017-04-16 DIAGNOSIS — I252 Old myocardial infarction: Secondary | ICD-10-CM | POA: Diagnosis not present

## 2017-04-16 DIAGNOSIS — K909 Intestinal malabsorption, unspecified: Secondary | ICD-10-CM | POA: Diagnosis not present

## 2017-04-16 DIAGNOSIS — I2510 Atherosclerotic heart disease of native coronary artery without angina pectoris: Secondary | ICD-10-CM | POA: Diagnosis not present

## 2017-04-16 DIAGNOSIS — I472 Ventricular tachycardia: Secondary | ICD-10-CM | POA: Diagnosis not present

## 2017-04-16 DIAGNOSIS — G4730 Sleep apnea, unspecified: Secondary | ICD-10-CM | POA: Insufficient documentation

## 2017-04-16 DIAGNOSIS — I517 Cardiomegaly: Secondary | ICD-10-CM | POA: Insufficient documentation

## 2017-04-16 DIAGNOSIS — N184 Chronic kidney disease, stage 4 (severe): Secondary | ICD-10-CM | POA: Diagnosis not present

## 2017-04-16 DIAGNOSIS — E1122 Type 2 diabetes mellitus with diabetic chronic kidney disease: Secondary | ICD-10-CM | POA: Insufficient documentation

## 2017-04-16 DIAGNOSIS — I5022 Chronic systolic (congestive) heart failure: Secondary | ICD-10-CM | POA: Insufficient documentation

## 2017-04-16 DIAGNOSIS — I700 Atherosclerosis of aorta: Secondary | ICD-10-CM | POA: Diagnosis not present

## 2017-04-16 DIAGNOSIS — E7800 Pure hypercholesterolemia, unspecified: Secondary | ICD-10-CM | POA: Diagnosis not present

## 2017-04-16 DIAGNOSIS — I255 Ischemic cardiomyopathy: Secondary | ICD-10-CM | POA: Diagnosis present

## 2017-04-16 DIAGNOSIS — R42 Dizziness and giddiness: Secondary | ICD-10-CM | POA: Diagnosis not present

## 2017-04-16 DIAGNOSIS — J449 Chronic obstructive pulmonary disease, unspecified: Secondary | ICD-10-CM | POA: Insufficient documentation

## 2017-04-16 DIAGNOSIS — R627 Adult failure to thrive: Secondary | ICD-10-CM | POA: Insufficient documentation

## 2017-04-16 DIAGNOSIS — I4891 Unspecified atrial fibrillation: Secondary | ICD-10-CM | POA: Diagnosis not present

## 2017-04-16 DIAGNOSIS — K2901 Acute gastritis with bleeding: Secondary | ICD-10-CM

## 2017-04-16 DIAGNOSIS — D6804 Acquired von Willebrand disease: Secondary | ICD-10-CM

## 2017-04-16 DIAGNOSIS — Z7901 Long term (current) use of anticoagulants: Secondary | ICD-10-CM | POA: Diagnosis not present

## 2017-04-16 DIAGNOSIS — N289 Disorder of kidney and ureter, unspecified: Secondary | ICD-10-CM

## 2017-04-16 DIAGNOSIS — R05 Cough: Secondary | ICD-10-CM | POA: Diagnosis not present

## 2017-04-16 DIAGNOSIS — Z95810 Presence of automatic (implantable) cardiac defibrillator: Secondary | ICD-10-CM | POA: Insufficient documentation

## 2017-04-16 DIAGNOSIS — D680 Von Willebrand's disease: Secondary | ICD-10-CM

## 2017-04-16 DIAGNOSIS — R059 Cough, unspecified: Secondary | ICD-10-CM

## 2017-04-16 DIAGNOSIS — J811 Chronic pulmonary edema: Secondary | ICD-10-CM | POA: Diagnosis not present

## 2017-04-16 DIAGNOSIS — K449 Diaphragmatic hernia without obstruction or gangrene: Secondary | ICD-10-CM | POA: Insufficient documentation

## 2017-04-16 LAB — TYPE AND SCREEN
ABO/RH(D): A POS
Antibody Screen: NEGATIVE
UNIT DIVISION: 0

## 2017-04-16 LAB — CBC
HCT: 30.3 % — ABNORMAL LOW (ref 39.0–52.0)
HEMOGLOBIN: 9 g/dL — AB (ref 13.0–17.0)
MCH: 26.9 pg (ref 26.0–34.0)
MCHC: 29.7 g/dL — ABNORMAL LOW (ref 30.0–36.0)
MCV: 90.7 fL (ref 78.0–100.0)
Platelets: 269 10*3/uL (ref 150–400)
RBC: 3.34 MIL/uL — AB (ref 4.22–5.81)
RDW: 20.7 % — ABNORMAL HIGH (ref 11.5–15.5)
WBC: 15.1 10*3/uL — AB (ref 4.0–10.5)

## 2017-04-16 LAB — LACTATE DEHYDROGENASE: LDH: 170 U/L (ref 98–192)

## 2017-04-16 LAB — BASIC METABOLIC PANEL
ANION GAP: 8 (ref 5–15)
BUN: 92 mg/dL — ABNORMAL HIGH (ref 6–20)
CALCIUM: 8.1 mg/dL — AB (ref 8.9–10.3)
CO2: 23 mmol/L (ref 22–32)
CREATININE: 2.42 mg/dL — AB (ref 0.61–1.24)
Chloride: 104 mmol/L (ref 101–111)
GFR, EST AFRICAN AMERICAN: 28 mL/min — AB (ref >60)
GFR, EST NON AFRICAN AMERICAN: 24 mL/min — AB (ref >60)
Glucose, Bld: 146 mg/dL — ABNORMAL HIGH (ref 65–99)
Potassium: 3.8 mmol/L (ref 3.5–5.1)
SODIUM: 135 mmol/L (ref 135–145)

## 2017-04-16 LAB — PROTIME-INR
INR: 2.8
PROTHROMBIN TIME: 30.1 s — AB (ref 11.4–15.2)

## 2017-04-16 LAB — BPAM RBC
Blood Product Expiration Date: 201805252359
ISSUE DATE / TIME: 201805140831
Unit Type and Rh: 6200

## 2017-04-16 MED ORDER — DOXYCYCLINE HYCLATE 100 MG PO TABS: 100.0000 mg | ORAL_TABLET | Freq: Two times a day (BID) | ORAL | 0 refills | Status: AC

## 2017-04-16 NOTE — Progress Notes (Signed)
Patient presents for 6 month  follow up in [facility] today. Reports no problems with VAD equipment or concerns with drive line. Complains of persistent cough that worsens at night that produces yellow sputum. Feels like ha has gained fluid.  Vital Signs:  Doppler Pressure 80   Automatc BP: 93/69 (76) HR:62   SPO2:95  %  Weight: 196 lb w/o eqt Last weight: 205.2 lb Home weights: 200 lbs   VAD Indication: Destination therapy    VAD interrogation & Equipment Management: Speed:9400 Flow: 58.6 Power:6 w    PI:4  Alarms: no clinical alarms Events: 15-25 PI events daily  Fixed speed 9400 Low speed limit: 8800  Primary Controller:  Replace back up battery in 64months. Back up controller:   Replace back up battery in 11 months.   I reviewed the LVAD parameters from today and compared the results to the patient's prior recorded data. LVAD interrogation was NEGATIVE for significant power changes, NEGATIVE for clinical alarms and STABLE for PI events/speed drops. No programming changes were made and pump is functioning within specified parameters. Pt is performing daily controller and system monitor self tests along with completing weekly and monthly maintenance for LVAD equipment.    Exit Site Care: Drive line is being maintained weekly  by wife. Drive line exit site well healed and incorporated. The velour is fully implanted at exit site. Dressing dry and intact. No erythema or drainage. Stabilization device present and accurately applied. Pt denies fever or chills. Pt states they have adequate dressing supplies at home.    Device:Medtronic CRT-D (Dr Allred)   BP & Labs:  MAP 80 - Doppler is reflecting MAP  Hgb 9.0 - No S/S of bleeding. Specifically denies melena/BRBPR or nosebleeds.  LDH stable at 170 with established baseline of 150- 250. Denies tea-colored urine. No power elevations noted on interrogation.   Plan:  1. RTC in 2 weeks 2. 2 view cxr obtained  today 3. Increase Torsemide to 40 mg twice a day for 3 days then resume 20 mg daily 4. Start Doxy for 7 days  Marcellus Scott RN VAD Coordinator   Office: [phone] 24/7 Emergency VAD Pager: [phone]

## 2017-04-16 NOTE — Patient Instructions (Signed)
Increase Torsemide to 40mg  (2 tablets) daily for 3 days then resume 20mg  daily.  Start Doxycycline as prescribed for 7 days  Return to clinic in 2 weeks

## 2017-04-16 NOTE — Progress Notes (Signed)
LVAD CLINIC NOTE  Patient ID: [name], male   DOB: Sep 30, 1941, 76 y.o.   MRN: [id number]  Cardiac Surgeon : [facility]: Dr Gala Romney EP: Dr Johney Frame.   HPI:  [name] is a 76 year old male with history of coronary artery disease with prior percutaneous coronary intervention, ischemic cardiomyopathy, chronic systolic heart failure. He had [name] implant at [facility] on 04/2012. Pt has atrial fibrillation, history of CVA in 1987, hypertension, hyperlipidemia, CKD, NSVT, anemia, and DVT/PE.   At [facility], an enteroscopy performed on 07/06/13 revealed a non-bleeding gastric ulcer on antrum. Normal duodenum and jejunum.  S/p  ICD generator change at [facility] 9/14. He had diaphragmatic stim with LV pacing. His LV lead was turned off at [facility].  He is in atrial fibrillation 100% of the time. RV pacing at 80. No ventricular arrhythmias were noted.   He returns for shared care visit. HE is here with his wife. He has been really struggling lately. Overall functional capacity very poor. Apparently broke his right ankle a few months ago and spent 4 months at [facility] because they were unable to find care facility for him.  Today complains of several week h/o cough intermittently productive of yellow sputum. No fevers or chills. Wife gave him 2 doses of augmentin they had at the house and he apparently had severe reaction and developed wound on RLE. Also notes edema. Fatigue on any exertion. No bleeding or stroke symptoms. No problem with VAD.  Taking all medications.   VAD Indication: Destination therapy    VAD interrogation & Equipment Management: Speed:9400 Flow: 58.6 Power:6 w PI:4  Alarms: no clinical alarms Events: 15-25 PI events daily  Fixed speed 9400 Low speed limit: 8800  Primary Controller: Replace back up battery in 81months. Back up controller: Replace back up battery in 44months.    Denies LVAD alarms. Denies driveline trauma, erythema or drainage.  Denies ICD shocks.  Reports taking Coumadin as prescribed and adherence to anticoagulation based dietary restrictions. Denies bright red blood per rectum or melena, no dark urine or hematuria.       Past Medical History:  Diagnosis Date  . Allergy   . Anemia   . Anemia of chronic renal failure, stage 4 (severe) (HCC) 02/11/2017  . Angina   . Anxiety   . Blood transfusion   . CAD (coronary artery disease)   . Cataract   . CHF (congestive heart failure) (HCC)   . Clotting disorder (HCC)   . COPD (chronic obstructive pulmonary disease) (HCC)   . Diabetes mellitus   . DVT of lower extremity (deep venous thrombosis) (HCC)    left  . Gout   . H/O hiatal hernia   . Heart murmur   . High cholesterol   . Hypertension   . Hypotension    ORTHOSTATIC  . Iron deficiency anemia due to chronic blood loss 02/11/2017  . Jaundice    "@ birth & when I was in 7th grade"  . Malabsorption of iron 02/11/2017  . Mitral valve disorder   . Myocardial infarction (HCC) 1988  . Neuromuscular disorder (HCC)   . Osteoporosis   . Pneumonia 1993; 2009   "both serious cases"  . Pneumonia 2010   "not hospitalized"  . Pulmonary embolism on left (HCC) 1988  . 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 Shortness of breath on exertion   . Sleep apnea   . Stomach ulcer 1987-1988  .  Stroke [facility]) 1987   "brain stem; skin sensitive since then; once in awhile slur"12/13/11)    Current Outpatient Prescriptions  Medication Sig Dispense Refill  . albuterol (PROVENTIL HFA;VENTOLIN HFA) 108 (90 BASE) MCG/ACT inhaler Inhale 1-2 puffs into the lungs as needed for wheezing or shortness of breath.    . budesonide-formoterol (SYMBICORT) 160-4.5 MCG/ACT inhaler Inhale 2 puffs into the lungs daily as needed.    . calcium carbonate (OS-CAL) 600 MG TABS tablet Take 600 mg by mouth 2 (two) times daily.    . Cholecalciferol (VITAMIN D3) 2000 UNITS TABS Take 1 tablet by mouth daily.    . colchicine 0.6 MG tablet Take 0.6 mg  by mouth daily.    . Darbepoetin Alfa (ARANESP) 100 MCG/0.5ML SOSY injection Inject into the skin.    [name] diclofenac sodium (VOLTAREN) 1 % GEL Apply topically.    . ferrous sulfate 325 (65 FE) MG tablet Take by mouth.    . hydroxychloroquine (PLAQUENIL) 200 MG tablet Take by mouth daily. 2 tabs with breakfast    . Melatonin 3-10 MG TABS Take 3 mg by mouth at bedtime as needed.    . metoprolol tartrate (LOPRESSOR) 25 MG tablet Take 25 mg by mouth 2 (two) times daily.    . Mirtazapine (REMERON PO) Take by mouth at bedtime.    . Multiple Vitamin (MULTIVITAMIN) tablet Take 1 tablet by mouth daily.    . pantoprazole (PROTONIX) 40 MG tablet Take 40 mg by mouth daily.    . polyethylene glycol (MIRALAX / GLYCOLAX) packet Take by mouth.    . pravastatin (PRAVACHOL) 40 MG tablet Take one by mouth daily    . predniSONE (DELTASONE) 10 MG tablet     . tamsulosin (FLOMAX) 0.4 MG CAPS capsule Take 0.4 mg by mouth daily.    [name] torsemide (DEMADEX) 20 MG tablet Take 40 mg by mouth every morning. May take an extra tab in the evening as needed.    [name] ULORIC 80 MG TABS     . warfarin (COUMADIN) 2 MG tablet Take 2-4 mg by mouth as directed. Managed by [facility] Patient checks at home    . acetaminophen (TYLENOL) 500 MG tablet Take 500 mg by mouth every 6 (six) hours as needed.    . doxycycline (VIBRA-TABS) 100 MG tablet Take 1 tablet (100 mg total) by mouth 2 (two) times daily. 14 tablet 0  . potassium chloride SA (K-DUR,KLOR-CON) 20 MEQ tablet Take by mouth.    . silver sulfADIAZINE (SILVADENE) 1 % cream APPLY EXTERNALLY TWICE A DAY  0   No current facility-administered medications for this encounter.     Amoxicillin-pot clavulanate; Levofloxacin; Oxycodone; Ace inhibitors; Ceftriaxone; Cefuroxime; Chlorhexidine gluconate; Clindamycin/lincomycin; Hydralazine; Meropenem; Sulfonamide derivatives; Vancomycin; and Zosyn [piperacillin sod-tazobactam so]  REVIEW OF SYSTEMS: All systems negative except as listed in HPI, PMH  and Problem list.   Vital Signs:  Doppler Pressure 80                Automatc BP: 93/69 (76) HR:62  SPO2:95  %  Weight: 196 lb w/o eqt Last weight: 205.2 lb Home weights: 200 lbs     Vitals:   04/16/17 1206  BP: 93/69  Pulse: 62  Resp: 16  SpO2: 95%  Weight: 196 lb (88.9 kg)  Height: 5\' 7"  (1.702 m)    Physical Exam: GENERAL: Elderly very weak appearing, male Wife present.  HEENT: normal  NECK: Supple, JVP ~8  with prominent CV wave.  2+ bilaterally, no bruits.  No lymphadenopathy or thyromegaly appreciated.   CARDIAC:  Mechanical heart sounds with LVAD hum present. Normal VAD sounds LUNGS:  Clear decreased at bases  ABDOMEN:  Soft NT/ND good BS LVAD exit site: well-healed and incorporated.  Dressing dry and intact.  No erythema or drainage.  Stabilization device present and accurately applied. No signs infection EXTREMITIES:  Warm and dry, no cyanosis, clubbing, rash. RLE wound. 2+ edema bilaterallyy NEUROLOGIC:  Alert and oriented x 4.  Weak extremities.  No aphasia.  No dysarthria.  Affect flat.       ASSESSMENT AND PLAN:  1) Chronic systolic HF: ICM, s/p LVAD 04/2012 at [facility]  - He has had a significant overall functional decline over past 6 months. Now NYHA  III-IIIB symptoms on LVAD support. - Volume status elevated. We will double torsemide to 40 mg for 3 days then go back to 20mg  daily.  - Reinforced need for daily weights and reviewed use of sliding scale diuretics. - s/p CRT-D - followed by [facility] 2) LVAD  - Implanted 04/2012 at [facility]. Interrogation ok with occasional PI events.   - LDH ok. Hgb stable at 9.0  3) Chronic Anticoagulation  -INR 2.8  INR goal 1.7-2.3 [facility] manages anticoagulation, Jean Rosenthal, NP. Will send value.  4) Atrial Fibrillation -chronic. On coumadin. No bleeding problems .  5) HTN -MAP ok. In the past unable to tolerate higher doses of meds due to dizziness. 6) LE wound -Following with wound clinic 7) Productive cough - We are  increasing diuretics.  - May have infectious component. He has many abx allergies.  - CXR reviewed personally. Mild edema. No infiltrate. Increasing torsemide.Given yellow sputum with treat for persistent bronchitis.Start doxy 100 mg bid for 7 days. Continue guaifenessin  8) FTT - May be nearing the time that Hospice discussions are warranted. We will see how he does.  Arvilla Meres MD 11:21 PM

## 2017-04-17 ENCOUNTER — Telehealth: Payer: Self-pay | Admitting: *Deleted

## 2017-04-17 NOTE — Telephone Encounter (Addendum)
Patient aware. Appointment scheduled  ----- Message from Verdie Mosher, NP sent at 04/16/2017  9:26 AM EDT ----- Regarding: Iron  Needs one dose of IV iron this week please. LOS sent to [facility]. Thank you!  Sarah  ----- Message ----- From: Interface, Lab In Three Zero One Sent: 04/12/2017   1:46 PM To: Verdie Mosher, NP

## 2017-04-23 ENCOUNTER — Encounter (HOSPITAL_COMMUNITY): Payer: Self-pay

## 2017-04-23 ENCOUNTER — Emergency Department (HOSPITAL_COMMUNITY): Payer: Medicare Other

## 2017-04-23 ENCOUNTER — Telehealth: Payer: Self-pay | Admitting: *Deleted

## 2017-04-23 ENCOUNTER — Other Ambulatory Visit: Payer: Self-pay | Admitting: Family

## 2017-04-23 ENCOUNTER — Inpatient Hospital Stay (HOSPITAL_COMMUNITY)
Admission: EM | Admit: 2017-04-23 | Discharge: 2017-06-02 | DRG: 377 | Disposition: E | Payer: Medicare Other | Attending: Internal Medicine | Admitting: Internal Medicine

## 2017-04-23 ENCOUNTER — Ambulatory Visit: Payer: Medicare Other

## 2017-04-23 ENCOUNTER — Ambulatory Visit (HOSPITAL_BASED_OUTPATIENT_CLINIC_OR_DEPARTMENT_OTHER): Payer: Medicare Other | Admitting: Family

## 2017-04-23 ENCOUNTER — Ambulatory Visit (HOSPITAL_BASED_OUTPATIENT_CLINIC_OR_DEPARTMENT_OTHER): Payer: Medicare Other

## 2017-04-23 DIAGNOSIS — K921 Melena: Secondary | ICD-10-CM

## 2017-04-23 DIAGNOSIS — F329 Major depressive disorder, single episode, unspecified: Secondary | ICD-10-CM | POA: Diagnosis not present

## 2017-04-23 DIAGNOSIS — Z9841 Cataract extraction status, right eye: Secondary | ICD-10-CM

## 2017-04-23 DIAGNOSIS — I255 Ischemic cardiomyopathy: Secondary | ICD-10-CM | POA: Diagnosis present

## 2017-04-23 DIAGNOSIS — Z515 Encounter for palliative care: Secondary | ICD-10-CM | POA: Diagnosis not present

## 2017-04-23 DIAGNOSIS — D500 Iron deficiency anemia secondary to blood loss (chronic): Secondary | ICD-10-CM | POA: Diagnosis present

## 2017-04-23 DIAGNOSIS — E872 Acidosis: Secondary | ICD-10-CM | POA: Diagnosis not present

## 2017-04-23 DIAGNOSIS — M810 Age-related osteoporosis without current pathological fracture: Secondary | ICD-10-CM | POA: Diagnosis present

## 2017-04-23 DIAGNOSIS — I5022 Chronic systolic (congestive) heart failure: Secondary | ICD-10-CM

## 2017-04-23 DIAGNOSIS — D680 Von Willebrand's disease: Secondary | ICD-10-CM

## 2017-04-23 DIAGNOSIS — E782 Mixed hyperlipidemia: Secondary | ICD-10-CM | POA: Diagnosis present

## 2017-04-23 DIAGNOSIS — E785 Hyperlipidemia, unspecified: Secondary | ICD-10-CM | POA: Diagnosis present

## 2017-04-23 DIAGNOSIS — Z86718 Personal history of other venous thrombosis and embolism: Secondary | ICD-10-CM

## 2017-04-23 DIAGNOSIS — Z87891 Personal history of nicotine dependence: Secondary | ICD-10-CM

## 2017-04-23 DIAGNOSIS — Z4509 Encounter for adjustment and management of other cardiac device: Secondary | ICD-10-CM

## 2017-04-23 DIAGNOSIS — R6521 Severe sepsis with septic shock: Secondary | ICD-10-CM | POA: Diagnosis not present

## 2017-04-23 DIAGNOSIS — R0682 Tachypnea, not elsewhere classified: Secondary | ICD-10-CM | POA: Diagnosis not present

## 2017-04-23 DIAGNOSIS — R238 Other skin changes: Secondary | ICD-10-CM | POA: Diagnosis not present

## 2017-04-23 DIAGNOSIS — E1122 Type 2 diabetes mellitus with diabetic chronic kidney disease: Secondary | ICD-10-CM | POA: Diagnosis present

## 2017-04-23 DIAGNOSIS — K909 Intestinal malabsorption, unspecified: Secondary | ICD-10-CM

## 2017-04-23 DIAGNOSIS — B379 Candidiasis, unspecified: Secondary | ICD-10-CM | POA: Diagnosis not present

## 2017-04-23 DIAGNOSIS — D62 Acute posthemorrhagic anemia: Secondary | ICD-10-CM | POA: Diagnosis present

## 2017-04-23 DIAGNOSIS — R0600 Dyspnea, unspecified: Secondary | ICD-10-CM | POA: Diagnosis not present

## 2017-04-23 DIAGNOSIS — E43 Unspecified severe protein-calorie malnutrition: Secondary | ICD-10-CM | POA: Diagnosis not present

## 2017-04-23 DIAGNOSIS — J440 Chronic obstructive pulmonary disease with acute lower respiratory infection: Secondary | ICD-10-CM | POA: Diagnosis present

## 2017-04-23 DIAGNOSIS — Y95 Nosocomial condition: Secondary | ICD-10-CM | POA: Diagnosis not present

## 2017-04-23 DIAGNOSIS — Z6830 Body mass index (BMI) 30.0-30.9, adult: Secondary | ICD-10-CM

## 2017-04-23 DIAGNOSIS — G4733 Obstructive sleep apnea (adult) (pediatric): Secondary | ICD-10-CM | POA: Diagnosis present

## 2017-04-23 DIAGNOSIS — E1142 Type 2 diabetes mellitus with diabetic polyneuropathy: Secondary | ICD-10-CM | POA: Diagnosis present

## 2017-04-23 DIAGNOSIS — K436 Other and unspecified ventral hernia with obstruction, without gangrene: Secondary | ICD-10-CM | POA: Diagnosis present

## 2017-04-23 DIAGNOSIS — E875 Hyperkalemia: Secondary | ICD-10-CM | POA: Diagnosis not present

## 2017-04-23 DIAGNOSIS — Z66 Do not resuscitate: Secondary | ICD-10-CM | POA: Diagnosis not present

## 2017-04-23 DIAGNOSIS — I482 Chronic atrial fibrillation: Secondary | ICD-10-CM | POA: Diagnosis present

## 2017-04-23 DIAGNOSIS — J189 Pneumonia, unspecified organism: Secondary | ICD-10-CM

## 2017-04-23 DIAGNOSIS — R571 Hypovolemic shock: Secondary | ICD-10-CM | POA: Diagnosis not present

## 2017-04-23 DIAGNOSIS — Z86711 Personal history of pulmonary embolism: Secondary | ICD-10-CM

## 2017-04-23 DIAGNOSIS — R339 Retention of urine, unspecified: Secondary | ICD-10-CM | POA: Diagnosis not present

## 2017-04-23 DIAGNOSIS — D6804 Acquired von Willebrand disease: Secondary | ICD-10-CM

## 2017-04-23 DIAGNOSIS — R000 Tachycardia, unspecified: Secondary | ICD-10-CM | POA: Diagnosis not present

## 2017-04-23 DIAGNOSIS — R32 Unspecified urinary incontinence: Secondary | ICD-10-CM | POA: Diagnosis not present

## 2017-04-23 DIAGNOSIS — R0602 Shortness of breath: Secondary | ICD-10-CM

## 2017-04-23 DIAGNOSIS — Z9842 Cataract extraction status, left eye: Secondary | ICD-10-CM

## 2017-04-23 DIAGNOSIS — N184 Chronic kidney disease, stage 4 (severe): Secondary | ICD-10-CM | POA: Diagnosis present

## 2017-04-23 DIAGNOSIS — L511 Stevens-Johnson syndrome: Secondary | ICD-10-CM | POA: Diagnosis not present

## 2017-04-23 DIAGNOSIS — Z8673 Personal history of transient ischemic attack (TIA), and cerebral infarction without residual deficits: Secondary | ICD-10-CM

## 2017-04-23 DIAGNOSIS — I472 Ventricular tachycardia: Secondary | ICD-10-CM | POA: Diagnosis not present

## 2017-04-23 DIAGNOSIS — Z95811 Presence of heart assist device: Secondary | ICD-10-CM | POA: Diagnosis not present

## 2017-04-23 DIAGNOSIS — Z881 Allergy status to other antibiotic agents status: Secondary | ICD-10-CM

## 2017-04-23 DIAGNOSIS — I130 Hypertensive heart and chronic kidney disease with heart failure and stage 1 through stage 4 chronic kidney disease, or unspecified chronic kidney disease: Secondary | ICD-10-CM | POA: Diagnosis present

## 2017-04-23 DIAGNOSIS — E7800 Pure hypercholesterolemia, unspecified: Secondary | ICD-10-CM | POA: Diagnosis present

## 2017-04-23 DIAGNOSIS — I5023 Acute on chronic systolic (congestive) heart failure: Secondary | ICD-10-CM | POA: Diagnosis not present

## 2017-04-23 DIAGNOSIS — R627 Adult failure to thrive: Secondary | ICD-10-CM | POA: Diagnosis not present

## 2017-04-23 DIAGNOSIS — I252 Old myocardial infarction: Secondary | ICD-10-CM | POA: Diagnosis not present

## 2017-04-23 DIAGNOSIS — Z7189 Other specified counseling: Secondary | ICD-10-CM | POA: Diagnosis not present

## 2017-04-23 DIAGNOSIS — N189 Chronic kidney disease, unspecified: Secondary | ICD-10-CM

## 2017-04-23 DIAGNOSIS — Z882 Allergy status to sulfonamides status: Secondary | ICD-10-CM

## 2017-04-23 DIAGNOSIS — I959 Hypotension, unspecified: Secondary | ICD-10-CM

## 2017-04-23 DIAGNOSIS — I2510 Atherosclerotic heart disease of native coronary artery without angina pectoris: Secondary | ICD-10-CM | POA: Diagnosis present

## 2017-04-23 DIAGNOSIS — J9690 Respiratory failure, unspecified, unspecified whether with hypoxia or hypercapnia: Secondary | ICD-10-CM

## 2017-04-23 DIAGNOSIS — Z7901 Long term (current) use of anticoagulants: Secondary | ICD-10-CM

## 2017-04-23 DIAGNOSIS — D649 Anemia, unspecified: Secondary | ICD-10-CM

## 2017-04-23 DIAGNOSIS — R0689 Other abnormalities of breathing: Secondary | ICD-10-CM

## 2017-04-23 DIAGNOSIS — Z885 Allergy status to narcotic agent status: Secondary | ICD-10-CM

## 2017-04-23 DIAGNOSIS — Z955 Presence of coronary angioplasty implant and graft: Secondary | ICD-10-CM

## 2017-04-23 DIAGNOSIS — T368X5A Adverse effect of other systemic antibiotics, initial encounter: Secondary | ICD-10-CM | POA: Diagnosis not present

## 2017-04-23 DIAGNOSIS — N179 Acute kidney failure, unspecified: Secondary | ICD-10-CM | POA: Diagnosis present

## 2017-04-23 DIAGNOSIS — Z961 Presence of intraocular lens: Secondary | ICD-10-CM | POA: Diagnosis present

## 2017-04-23 DIAGNOSIS — A419 Sepsis, unspecified organism: Secondary | ICD-10-CM | POA: Diagnosis not present

## 2017-04-23 DIAGNOSIS — Z95810 Presence of automatic (implantable) cardiac defibrillator: Secondary | ICD-10-CM

## 2017-04-23 DIAGNOSIS — Z888 Allergy status to other drugs, medicaments and biological substances status: Secondary | ICD-10-CM

## 2017-04-23 LAB — CBC WITH DIFFERENTIAL (CANCER CENTER ONLY)
BASO#: 0 10*3/uL (ref 0.0–0.2)
BASO%: 0.1 % (ref 0.0–2.0)
EOS ABS: 0 10*3/uL (ref 0.0–0.5)
EOS%: 0 % (ref 0.0–7.0)
HCT: 22.1 % — ABNORMAL LOW (ref 38.7–49.9)
HEMOGLOBIN: 6.6 g/dL — AB (ref 13.0–17.1)
LYMPH#: 0.5 10*3/uL — AB (ref 0.9–3.3)
LYMPH%: 2.2 % — ABNORMAL LOW (ref 14.0–48.0)
MCH: 28.3 pg (ref 28.0–33.4)
MCHC: 29.9 g/dL — ABNORMAL LOW (ref 32.0–35.9)
MCV: 95 fL (ref 82–98)
MONO#: 1.1 10*3/uL — ABNORMAL HIGH (ref 0.1–0.9)
MONO%: 4.3 % (ref 0.0–13.0)
NEUT%: 93.4 % — ABNORMAL HIGH (ref 40.0–80.0)
NEUTROS ABS: 22.8 10*3/uL — AB (ref 1.5–6.5)
Platelets: 382 10*3/uL (ref 145–400)
RBC: 2.33 10*6/uL — AB (ref 4.20–5.70)
RDW: 20.1 % — ABNORMAL HIGH (ref 11.1–15.7)
WBC: 24.4 10*3/uL — ABNORMAL HIGH (ref 4.0–10.0)

## 2017-04-23 LAB — URINALYSIS, ROUTINE W REFLEX MICROSCOPIC
BILIRUBIN URINE: NEGATIVE
Glucose, UA: NEGATIVE mg/dL
Hgb urine dipstick: NEGATIVE
Ketones, ur: NEGATIVE mg/dL
Leukocytes, UA: NEGATIVE
NITRITE: NEGATIVE
Protein, ur: NEGATIVE mg/dL
SPECIFIC GRAVITY, URINE: 1.014 (ref 1.005–1.030)
pH: 5 (ref 5.0–8.0)

## 2017-04-23 LAB — COMPREHENSIVE METABOLIC PANEL
ALBUMIN: 2.1 g/dL — AB (ref 3.5–5.0)
ALK PHOS: 79 U/L (ref 38–126)
ALT: 23 U/L (ref 17–63)
ANION GAP: 7 (ref 5–15)
AST: 19 U/L (ref 15–41)
BUN: 115 mg/dL — ABNORMAL HIGH (ref 6–20)
CHLORIDE: 104 mmol/L (ref 101–111)
CO2: 25 mmol/L (ref 22–32)
Calcium: 8.7 mg/dL — ABNORMAL LOW (ref 8.9–10.3)
Creatinine, Ser: 2.58 mg/dL — ABNORMAL HIGH (ref 0.61–1.24)
GFR calc non Af Amer: 22 mL/min — ABNORMAL LOW (ref >60)
GFR, EST AFRICAN AMERICAN: 26 mL/min — AB (ref >60)
GLUCOSE: 137 mg/dL — AB (ref 65–99)
Potassium: 4.7 mmol/L (ref 3.5–5.1)
Sodium: 136 mmol/L (ref 135–145)
Total Bilirubin: 0.6 mg/dL (ref 0.3–1.2)
Total Protein: 5 g/dL — ABNORMAL LOW (ref 6.5–8.1)

## 2017-04-23 LAB — ABO/RH: ABO/RH(D): A POS

## 2017-04-23 LAB — CMP (CANCER CENTER ONLY)
ALBUMIN: 2.2 g/dL — AB (ref 3.3–5.5)
ALK PHOS: 83 U/L (ref 26–84)
ALT(SGPT): 27 U/L (ref 10–47)
AST: 25 U/L (ref 11–38)
BILIRUBIN TOTAL: 0.6 mg/dL (ref 0.20–1.60)
BUN, Bld: 108 mg/dL — ABNORMAL HIGH (ref 7–22)
CHLORIDE: 99 meq/L (ref 98–108)
CO2: 24 mEq/L (ref 18–33)
Calcium: 8.9 mg/dL (ref 8.0–10.3)
Creat: 2.8 mg/dl — ABNORMAL HIGH (ref 0.6–1.2)
Glucose, Bld: 170 mg/dL — ABNORMAL HIGH (ref 73–118)
Potassium: 4.7 mEq/L (ref 3.3–4.7)
SODIUM: 136 meq/L (ref 128–145)
TOTAL PROTEIN: 5.3 g/dL — AB (ref 6.4–8.1)

## 2017-04-23 LAB — TECHNOLOGIST REVIEW CHCC SATELLITE

## 2017-04-23 LAB — IRON AND TIBC
%SAT: 18 % — ABNORMAL LOW (ref 20–55)
Iron: 37 ug/dL — ABNORMAL LOW (ref 42–163)
TIBC: 201 ug/dL — ABNORMAL LOW (ref 202–409)
UIBC: 164 ug/dL (ref 117–376)

## 2017-04-23 LAB — PREPARE RBC (CROSSMATCH)

## 2017-04-23 LAB — MRSA PCR SCREENING: MRSA BY PCR: NEGATIVE

## 2017-04-23 LAB — PROTIME-INR
INR: 4.12
PROTHROMBIN TIME: 40.5 s — AB (ref 11.4–15.2)

## 2017-04-23 LAB — HEMOGLOBIN AND HEMATOCRIT, BLOOD
HEMATOCRIT: 27.6 % — AB (ref 39.0–52.0)
HEMOGLOBIN: 8.4 g/dL — AB (ref 13.0–17.0)

## 2017-04-23 LAB — LACTATE DEHYDROGENASE: LDH: 143 U/L (ref 98–192)

## 2017-04-23 LAB — FERRITIN: FERRITIN: 102 ng/mL (ref 22–316)

## 2017-04-23 LAB — I-STAT CG4 LACTIC ACID, ED: Lactic Acid, Venous: 1.25 mmol/L (ref 0.5–1.9)

## 2017-04-23 MED ORDER — VITAMIN D 1000 UNITS PO TABS
1000.0000 [IU] | ORAL_TABLET | Freq: Every day | ORAL | Status: DC
  Administered 2017-04-24 – 2017-05-10 (×17): 1000 [IU] via ORAL
  Filled 2017-04-23 (×17): qty 1

## 2017-04-23 MED ORDER — CALCIUM CARBONATE 600 MG PO TABS: 600.0000 mg | ORAL_TABLET | Freq: Two times a day (BID) | ORAL | Status: DC

## 2017-04-23 MED ORDER — ORAL CARE MOUTH RINSE: 15.0000 mL | Freq: Two times a day (BID) | OROMUCOSAL | Status: DC

## 2017-04-23 MED ORDER — SILVER SULFADIAZINE 1 % EX CREA: TOPICAL_CREAM | Freq: Every day | CUTANEOUS | Status: DC

## 2017-04-23 MED ORDER — FERROUS SULFATE 325 (65 FE) MG PO TABS
325.0000 mg | ORAL_TABLET | Freq: Every day | ORAL | Status: DC
  Administered 2017-04-24 – 2017-05-10 (×17): 325 mg via ORAL
  Filled 2017-04-23 (×17): qty 1

## 2017-04-23 MED ORDER — TAMSULOSIN HCL 0.4 MG PO CAPS
0.4000 mg | ORAL_CAPSULE | Freq: Every day | ORAL | Status: DC
  Administered 2017-04-24 – 2017-05-10 (×17): 0.4 mg via ORAL
  Filled 2017-04-23 (×17): qty 1

## 2017-04-23 MED ORDER — MOMETASONE FURO-FORMOTEROL FUM 200-5 MCG/ACT IN AERO
2.0000 | INHALATION_SPRAY | Freq: Two times a day (BID) | RESPIRATORY_TRACT | Status: DC
  Administered 2017-04-23 – 2017-05-13 (×37): 2 via RESPIRATORY_TRACT
  Filled 2017-04-23 (×2): qty 8.8

## 2017-04-23 MED ORDER — PRAVASTATIN SODIUM 40 MG PO TABS
40.0000 mg | ORAL_TABLET | Freq: Every day | ORAL | Status: DC
  Administered 2017-04-24 – 2017-05-10 (×17): 40 mg via ORAL
  Filled 2017-04-23 (×17): qty 1

## 2017-04-23 MED ORDER — MELATONIN 3 MG PO TABS
3.0000 mg | ORAL_TABLET | Freq: Every evening | ORAL | Status: DC | PRN
  Filled 2017-04-23: qty 1

## 2017-04-23 MED ORDER — FEBUXOSTAT 40 MG PO TABS
80.0000 mg | ORAL_TABLET | Freq: Every day | ORAL | Status: DC
  Administered 2017-04-24: 80 mg via ORAL
  Filled 2017-04-23 (×2): qty 2

## 2017-04-23 MED ORDER — VANCOMYCIN HCL 10 G IV SOLR
2000.0000 mg | Freq: Once | INTRAVENOUS | Status: AC
  Administered 2017-04-23: 2000 mg via INTRAVENOUS
  Filled 2017-04-23: qty 2000

## 2017-04-23 MED ORDER — DIPHENHYDRAMINE HCL 50 MG/ML IJ SOLN
25.0000 mg | Freq: Once | INTRAMUSCULAR | Status: AC
  Administered 2017-04-23: 25 mg via INTRAVENOUS
  Filled 2017-04-23: qty 1

## 2017-04-23 MED ORDER — VANCOMYCIN HCL 10 G IV SOLR
2000.0000 mg | Freq: Once | INTRAVENOUS | Status: DC
  Filled 2017-04-23: qty 2000

## 2017-04-23 MED ORDER — HYDROXYCHLOROQUINE SULFATE 200 MG PO TABS
400.0000 mg | ORAL_TABLET | Freq: Every day | ORAL | Status: DC
  Administered 2017-04-24 – 2017-05-10 (×17): 400 mg via ORAL
  Filled 2017-04-23 (×19): qty 2

## 2017-04-23 MED ORDER — MELATONIN 3-10 MG PO TABS: 3.0000 mg | ORAL_TABLET | Freq: Every evening | ORAL | Status: DC | PRN

## 2017-04-23 MED ORDER — VANCOMYCIN HCL IN DEXTROSE 1-5 GM/200ML-% IV SOLN: 1000.0000 mg | INTRAVENOUS | Status: DC

## 2017-04-23 MED ORDER — ACETAMINOPHEN 325 MG PO TABS
650.0000 mg | ORAL_TABLET | ORAL | Status: DC | PRN
  Administered 2017-04-25 – 2017-05-05 (×10): 650 mg via ORAL
  Filled 2017-04-23 (×11): qty 2

## 2017-04-23 MED ORDER — PANTOPRAZOLE SODIUM 40 MG IV SOLR
40.0000 mg | Freq: Every day | INTRAVENOUS | Status: DC
  Administered 2017-04-23: 40 mg via INTRAVENOUS
  Filled 2017-04-23: qty 40

## 2017-04-23 MED ORDER — FEBUXOSTAT 80 MG PO TABS: 80.0000 mg | ORAL_TABLET | Freq: Every day | ORAL | Status: DC

## 2017-04-23 MED ORDER — VANCOMYCIN HCL IN DEXTROSE 1-5 GM/200ML-% IV SOLN
1000.0000 mg | INTRAVENOUS | Status: DC
  Administered 2017-04-24 – 2017-04-29 (×6): 1000 mg via INTRAVENOUS
  Filled 2017-04-23 (×6): qty 200

## 2017-04-23 MED ORDER — SODIUM CHLORIDE 0.9 % IV SOLN: Freq: Once | INTRAVENOUS | Status: AC

## 2017-04-23 MED ORDER — VITAMIN D3 50 MCG (2000 UT) PO TABS: 1.0000 | ORAL_TABLET | Freq: Every day | ORAL | Status: DC

## 2017-04-23 MED ORDER — MIRTAZAPINE 15 MG PO TABS
7.5000 mg | ORAL_TABLET | Freq: Every day | ORAL | Status: DC
  Administered 2017-04-23 – 2017-05-12 (×20): 7.5 mg via ORAL
  Filled 2017-04-23 (×20): qty 1

## 2017-04-23 MED ORDER — CALCIUM CARBONATE 1250 (500 CA) MG PO TABS
1.0000 | ORAL_TABLET | Freq: Two times a day (BID) | ORAL | Status: DC
  Administered 2017-04-24 – 2017-05-10 (×34): 500 mg via ORAL
  Filled 2017-04-23 (×41): qty 1

## 2017-04-23 MED ORDER — DEXTROSE 5 % IV SOLN
1.0000 g | INTRAVENOUS | Status: DC
  Administered 2017-04-23 – 2017-04-24 (×2): 1 g via INTRAVENOUS
  Filled 2017-04-23 (×2): qty 1

## 2017-04-23 MED ORDER — SODIUM CHLORIDE 0.9 % IV BOLUS (SEPSIS)
1000.0000 mL | Freq: Once | INTRAVENOUS | Status: AC
  Administered 2017-04-23: 1000 mL via INTRAVENOUS

## 2017-04-23 MED ORDER — ALBUTEROL SULFATE (2.5 MG/3ML) 0.083% IN NEBU: 3.0000 mL | INHALATION_SOLUTION | Freq: Four times a day (QID) | RESPIRATORY_TRACT | Status: DC | PRN

## 2017-04-23 MED ORDER — CEFEPIME HCL 1 G IJ SOLR
1.0000 g | INTRAMUSCULAR | Status: DC
  Filled 2017-04-23: qty 1

## 2017-04-23 MED ORDER — ONDANSETRON HCL 4 MG/2ML IJ SOLN
4.0000 mg | Freq: Four times a day (QID) | INTRAMUSCULAR | Status: DC | PRN
  Administered 2017-05-02: 4 mg via INTRAVENOUS
  Filled 2017-04-23: qty 2

## 2017-04-23 NOTE — Telephone Encounter (Signed)
Critical Value Hgb 6.6 Emeline Gins NP notified. She is sending patient to the ED

## 2017-04-23 NOTE — ED Notes (Signed)
Blank bank already has blood sample. Type and screen discontinued.

## 2017-04-23 NOTE — ED Provider Notes (Signed)
[facility]-EMERGENCY DEPT Provider Note   CSN: [id number] Arrival date & time: 04/16/2017  1345     History   Chief Complaint Chief Complaint  Patient presents with  . Abnormal Lab    LVAD pt    HPI [name] is a 76 y.o. male.  HPI   76yo male with hx of LVAD placed at [facility], CAD, atrial fibrillation, CVA, prior DVT/PE, htn, hlpd, systolic CHF who presented from hematologist office for concern for low hgb, with history of black tarry stools over weeks, worsening fatigue, dyspnea.  Hgb 6.6.  WBC 24.4.  Reports weeks of black stool, typically 1-2 per day, tarry, loose. No diarrhea, no nausea or vomiting, no abdominal pain. Reports some dyspnea, worsening fatigue. No chest pain. Reports also has had several weeks of cough that is productive of yellow sputum at times.  No dysuria. Sent from office when found to have low hgb.  Past Medical History:  Diagnosis Date  . Allergy   . Anemia   . Anemia of chronic renal failure, stage 4 (severe) (HCC) 02/11/2017  . Angina   . Anxiety   . Blood transfusion   . CAD (coronary artery disease)   . Cataract   . CHF (congestive heart failure) (HCC)   . Clotting disorder (HCC)   . COPD (chronic obstructive pulmonary disease) (HCC)   . Diabetes mellitus   . DVT of lower extremity (deep venous thrombosis) (HCC)    left  . Gout   . H/O hiatal hernia   . Heart murmur   . High cholesterol   . Hypertension   . Hypotension    ORTHOSTATIC  . Iron deficiency anemia due to chronic blood loss 02/11/2017  . Jaundice    "@ birth & when I was in 7th grade"  . Malabsorption of iron 02/11/2017  . Mitral valve disorder   . Myocardial infarction (HCC) 1988  . Neuromuscular disorder (HCC)   . Osteoporosis   . Pneumonia 1993; 2009   "both serious cases"  . Pneumonia 2010   "not hospitalized"  . Pulmonary embolism on left (HCC) 1988  . 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 Shortness of breath on exertion   . Sleep apnea   . Stomach ulcer  1987-1988  . Stroke [facility]) 1987   "brain stem; skin sensitive since then; once in awhile slur"12/13/11)    Patient Active Problem List   Diagnosis Date Noted  . Symptomatic anemia 04/19/2017  . Iron deficiency anemia due to chronic blood loss 02/11/2017  . Malabsorption of iron 02/11/2017  . Anemia of chronic renal failure, stage 4 (severe) (HCC) 02/11/2017  . Mass of pancreas 09/01/2015  . Falls 08/17/2015  . LVAD (left ventricular assist device) present (HCC) 09/15/2014  . Fatigue 03/18/2014  . GI bleed 07/14/2013  . Implantable cardiac defibrillator lead failure 11/20/2012  . ICD-Medtronic 11/13/2012  . Gout 11/12/2012  . Acquired von Willebrand disease (HCC) 05/27/2012  . Mitral regurgitation 11/20/2011  . Renal insufficiency 11/20/2011  . Edema 08/28/2011  . Peripheral neuropathy 08/28/2011  . Long term current use of anticoagulant therapy 02/23/2011  . SHORTNESS OF BREATH 12/21/2010  . POSTSURG PERCUT TRANSLUMINAL COR ANGPLSTY STS 12/21/2010  . CHRONIC SYSTOLIC HEART FAILURE 11/24/2010  . ATRIAL FIBRILLATION 10/13/2010  . DM 10/11/2009  . OBSTRUCTIVE SLEEP APNEA 10/11/2009  . CORONARY ARTERY DISEASE, S/P PTCA 10/11/2009  . DVT 10/11/2009  . PULMONARY NODULE 10/11/2009  . MESENTERIC VENOUS THROMBOSIS 10/11/2009  .  OTH SPEC D/O RESULT FROM IMPAIRED RENAL FUNCTION 10/11/2009  . PULMONARY EMBOLISM, HX OF 10/11/2009  . HYPERLIPIDEMIA-MIXED 08/16/2009  . CAD, NATIVE VESSEL 08/16/2009  . HYPOTENSION, ORTHOSTATIC 08/16/2009    Past Surgical History:  Procedure Laterality Date  . BIV  11/2010   ICD 12/11 MEDTRONIC  . CARDIAC CATHETERIZATION  12/13/11  . CATARACT EXTRACTION W/ INTRAOCULAR LENS  IMPLANT, BILATERAL  1990's  . CORONARY ANGIOPLASTY  1988  . CORONARY ANGIOPLASTY WITH STENT PLACEMENT  2003   "1"  . EXPLORATORY LAPAROTOMY W/ BOWEL RESECTION  1993  . HIP FRACTURE SURGERY Right 2014  . IMPLANTABLE CARDIOVERTER DEFIBRILLATOR GENERATOR CHANGE  08/19/13   MDT Coletta Memos CRT-D generator change by Dr Wilford Grist at [facility].  RV lead with chronically elevated threshold and low R waves not revised.  LV lead with chronic diaphrgramtic stim turned off at [facility].  . leff ventricular assist device (LVAD)  05/02/12   LVAD implanted at [facility]  . RIGHT HEART CATHETERIZATION N/A 12/13/2011   Procedure: RIGHT HEART CATH;  Surgeon: Dolores Patty, MD;  Location: [facility] CATH LAB;  Service: Cardiovascular;  Laterality: N/A;  . RIGHT HEART CATHETERIZATION N/A 02/08/2012   Procedure: RIGHT HEART CATH;  Surgeon: Dolores Patty, MD;  Location: [facility] CATH LAB;  Service: Cardiovascular;  Laterality: N/A;  . TRACHEOSTOMY  1993       Home Medications    Prior to Admission medications   Medication Sig Start Date End Date Taking? Authorizing Provider  acetaminophen (TYLENOL) 500 MG tablet Take 1,000 mg by mouth every 6 (six) hours as needed.    Yes [provider]  albuterol (PROVENTIL HFA;VENTOLIN HFA) 108 (90 BASE) MCG/ACT inhaler Inhale 1-2 puffs into the lungs as needed for wheezing or shortness of breath.   Yes [provider]  budesonide-formoterol (SYMBICORT) 160-4.5 MCG/ACT inhaler Inhale 2 puffs into the lungs 2 (two) times daily.    Yes [provider]  calcium carbonate (OS-CAL) 600 MG TABS tablet Take 1,200 mg by mouth 2 (two) times daily.    Yes [provider]  Cholecalciferol (VITAMIN D3) 2000 UNITS TABS Take 1 tablet by mouth daily.   Yes [provider]  colchicine 0.6 MG tablet Take 0.6 mg by mouth daily.   Yes [provider]  Darbepoetin Alfa (ARANESP) 100 MCG/0.5ML SOSY injection Inject into the skin. Every three week last one May 11 th next on due  June 1st 01/29/17  Yes [provider]  diclofenac sodium (VOLTAREN) 1 % GEL Apply topically. 01/25/17  Yes [provider]  doxycycline (VIBRA-TABS) 100 MG tablet Take 1 tablet (100 mg total) by mouth 2 (two) times daily. 04/16/17  Yes Laurey Morale, MD    ferrous sulfate 325 (65 FE) MG tablet Take 325 mg by mouth.  01/25/17 01/25/18 Yes [provider]  hydroxychloroquine (PLAQUENIL) 200 MG tablet Take by mouth daily. 2 tabs with breakfast   Yes [provider]  Melatonin 3-10 MG TABS Take 5 mg by mouth at bedtime as needed.    Yes [provider]  metoprolol tartrate (LOPRESSOR) 25 MG tablet Take 25 mg by mouth 2 (two) times daily.   Yes [provider]  Mirtazapine (REMERON PO) Take 15 mg by mouth at bedtime.    Yes [provider]  Multiple Vitamin (MULTIVITAMIN) tablet Take 1 tablet by mouth daily.   Yes [provider]  pantoprazole (PROTONIX) 40 MG tablet Take 40 mg by mouth 2 (two) times daily.  Yes [provider]  pravastatin (PRAVACHOL) 40 MG tablet Take one by mouth daily   Yes Serpe, Clide Deutscher, PA-C  predniSONE (DELTASONE) 10 MG tablet Take 10 mg by mouth as needed.  03/08/17  Yes [provider]  tamsulosin (FLOMAX) 0.4 MG CAPS capsule Take 0.4 mg by mouth daily.   Yes [provider]  torsemide (DEMADEX) 20 MG tablet Take 20 mg by mouth every morning. May take an extra tab in the evening as needed.   Yes [provider]  ULORIC 80 MG TABS Take 80 mg by mouth daily.  01/03/17  Yes [provider]  warfarin (COUMADIN) 2 MG tablet Take 1-3 mg by mouth as directed. Managed by [facility] Patient checks at home   Yes [provider]    Family History No family history on file.  Social History Social History  Substance Use Topics  . Smoking status: Former Smoker    Packs/day: 1.00    Years: 30.00    Types: Cigarettes    Quit date: 12/03/1986  . Smokeless tobacco: Never Used  . Alcohol use No     Allergies   Amoxicillin-pot clavulanate; Levofloxacin; Oxycodone; Ace inhibitors; Ceftriaxone; Cefuroxime; Chlorhexidine gluconate; Clindamycin/lincomycin; Hydralazine; Meropenem; Sulfonamide derivatives; Vancomycin; and Zosyn [piperacillin  sod-tazobactam so]   Review of Systems Review of Systems  Constitutional: Positive for chills and fatigue. Negative for fever.  HENT: Negative for sore throat.   Eyes: Negative for visual disturbance.  Respiratory: Positive for cough and shortness of breath.   Cardiovascular: Negative for chest pain.  Gastrointestinal: Positive for blood in stool. Negative for abdominal pain, nausea and vomiting.  Genitourinary: Negative for difficulty urinating.  Skin: Positive for wound (RLE). Negative for rash.  Neurological: Negative for syncope and headaches.     Physical Exam Updated Vital Signs BP (!) 72/29   Pulse 61   Temp 98.2 F (36.8 C) (Oral)   Resp 15   Ht 5\' 7"  (1.702 m)   Wt 88.9 kg (196 lb)   SpO2 98%   BMI 30.70 kg/m   Physical Exam  Constitutional: He is oriented to person, place, and time. He appears well-developed and well-nourished. He has a sickly appearance. He appears ill. No distress.  HENT:  Head: Normocephalic and atraumatic.  Eyes: Conjunctivae and EOM are normal.  Neck: Normal range of motion.  Cardiovascular:  LVAD  Pulmonary/Chest: Effort normal and breath sounds normal. No respiratory distress. He has no wheezes. He has no rales.  Abdominal: Soft. He exhibits no distension. There is no tenderness. There is no guarding.  Drive-line without surrounding erythema  Musculoskeletal: He exhibits no edema.  Neurological: He is alert and oriented to person, place, and time.  Skin: Skin is warm and dry. He is not diaphoretic. There is pallor.  Nursing note and vitals reviewed.    ED Treatments / Results  Labs (all labs ordered are listed, but only abnormal results are displayed) Labs Reviewed  PROTIME-INR - Abnormal; Notable for the following:       Result Value   Prothrombin Time 40.5 (*)    INR 4.12 (*)    All other components within normal limits  COMPREHENSIVE METABOLIC PANEL - Abnormal; Notable for the following:    Glucose, Bld 137 (*)    BUN 115  (*)    Creatinine, Ser 2.58 (*)    Calcium 8.7 (*)    Total Protein 5.0 (*)    Albumin 2.1 (*)    GFR calc non Af  Amer 22 (*)    GFR calc Af Amer 26 (*)    All other components within normal limits  HEMOGLOBIN AND HEMATOCRIT, BLOOD - Abnormal; Notable for the following:    Hemoglobin 8.4 (*)    HCT 27.6 (*)    All other components within normal limits  MRSA PCR SCREENING  CULTURE, BLOOD (ROUTINE X 2)  CULTURE, BLOOD (ROUTINE X 2)  LACTATE DEHYDROGENASE  URINALYSIS, ROUTINE W REFLEX MICROSCOPIC  LACTATE DEHYDROGENASE  PROTIME-INR  CBC  BASIC METABOLIC PANEL  I-STAT CG4 LACTIC ACID, ED  I-STAT CG4 LACTIC ACID, ED  TYPE AND SCREEN  PREPARE RBC (CROSSMATCH)  ABO/RH    EKG  EKG Interpretation None       Radiology Dg Chest Portable 1 View  Result Date: 05-15-2017 CLINICAL DATA:  76 y/o  M; cough. EXAM: PORTABLE CHEST 1 VIEW COMPARISON:  04/16/2017 chest radiograph FINDINGS: Stable cardiac silhouette given projection and technique. Aortic atherosclerosis with calcification. Stable 19 mm nodular density projecting over right mid lung zone. 3 lead AICD. Partially visualized LVAD device noted. Median sternotomy wires are intact. Ill-defined opacity with left upper lobe lingula. No pleural effusion or pneumothorax. No acute osseous abnormality is evident. IMPRESSION: 1. Ill-defined opacity in left upper lobe lingula may represent atelectasis or infiltrate. 2. Stable 19 mm density projecting over right mid lung zone. 3. 3 lead AICD and LVAD device. Electronically Signed   By: Mitzi Hansen M.D.   On: 05/15/2017 16:27    Procedures Procedures (including critical care time)  Medications Ordered in ED Medications  pravastatin (PRAVACHOL) tablet 40 mg (40 mg Oral Not Given May 15, 2017 2030)  mometasone-formoterol (DULERA) 200-5 MCG/ACT inhaler 2 puff (2 puffs Inhalation Given 05-15-17 2023)  albuterol (PROVENTIL) (2.5 MG/3ML) 0.083% nebulizer solution 3 mL (not administered)    tamsulosin (FLOMAX) capsule 0.4 mg (0.4 mg Oral Not Given 05/15/17 2030)  mirtazapine (REMERON) tablet 7.5 mg (7.5 mg Oral Given May 15, 2017 2213)  ferrous sulfate tablet 325 mg (not administered)  hydroxychloroquine (PLAQUENIL) tablet 400 mg (not administered)  acetaminophen (TYLENOL) tablet 650 mg (not administered)  ondansetron (ZOFRAN) injection 4 mg (not administered)  pantoprazole (PROTONIX) injection 40 mg (40 mg Intravenous Given 15-May-2017 2201)  cholecalciferol (VITAMIN D) tablet 1,000 Units (not administered)  calcium carbonate (OS-CAL - dosed in mg of elemental calcium) tablet 500 mg of elemental calcium (500 mg of elemental calcium Oral Not Given 2017-05-15 1730)  febuxostat (ULORIC) tablet 80 mg (not administered)  Melatonin TABS 3 mg (not administered)  MEDLINE mouth rinse (not administered)  ceFEPIme (MAXIPIME) 1 g in dextrose 5 % 50 mL IVPB (0 g Intravenous Stopped 2017/05/15 2317)  vancomycin (VANCOCIN) 2,000 mg in sodium chloride 0.9 % 500 mL IVPB (2,000 mg Intravenous New Bag/Given 05-15-17 2320)  vancomycin (VANCOCIN) IVPB 1000 mg/200 mL premix (not administered)  0.9 %  sodium chloride infusion ( Intravenous Duplicate 05-15-2017 1900)  sodium chloride 0.9 % bolus 1,000 mL (0 mLs Intravenous Stopped 05/15/2017 1900)  diphenhydrAMINE (BENADRYL) injection 25 mg (25 mg Intravenous Given May 15, 2017 1536)  diphenhydrAMINE (BENADRYL) injection 25 mg (25 mg Intravenous Given 2017-05-15 2159)   CRITICAL CARE: LVAD, symptomatic anemia, hgb <7, transfusion ED Performed by: Rhae Lerner   Total critical care time: 30 minutes  Critical care time was exclusive of separately billable procedures and treating other patients.  Critical care was necessary to treat or prevent imminent or life-threatening deterioration.  Critical care was time spent personally by me on the following activities: development of treatment  plan with patient and/or surrogate as well as nursing, discussions with  consultants, evaluation of patient's response to treatment, examination of patient, obtaining history from patient or surrogate, ordering and performing treatments and interventions, ordering and review of laboratory studies, ordering and review of radiographic studies, pulse oximetry and re-evaluation of patient's condition.   Initial Impression / Assessment and Plan / ED Course  I have reviewed the triage vital signs and the nursing notes.  Pertinent labs & imaging results that were available during my care of the patient were reviewed by me and considered in my medical decision making (see chart for details).     76yo male with hx of LVAD placed at [facility], CAD, atrial fibrillation, CVA, prior DVT/PE, htn, hlpd, systolic CHF who presented from hematologist office for concern for low hgb, with history of black tarry stools over weeks, worsening fatigue, dyspnea.  Hgb 6.6.  WBC 24.4.  Patient with hypovolemia and hypotension.  [place] IV access placed by resident physician Dr. Carolan Clines.  LVAD team consulted and Dr. Gala Romney present shortly after arrival to ED.  Given anemia, 2U pRBC ordered and given in addition to IV fluids.  Given hypotension, leukocytosis, cough, ordered blood cx, empiric vanc/cefepime. Discussed with pharmacy given pt with rash to many abx, given benadryl and will monitor. Portable XR with question of pneumonia.  Patient to be admitted for concern for GI bleed with symptomatic anemia and with additional symptoms and findings concerning for sepsis secondary pneumonia.    Final Clinical Impressions(s) / ED Diagnoses   Final diagnoses:  Gastrointestinal hemorrhage with melena  Symptomatic anemia  LVAD (left ventricular assist device) present (HCC)  Hypotension, unspecified hypotension type  Healthcare-associated pneumonia    New Prescriptions Current Discharge Medication List       Alvira Monday, MD 04/24/17 0021

## 2017-04-23 NOTE — Progress Notes (Signed)
Hematology and Oncology Follow Up Visit  [name] [id number] Aug 20, 1941 76 y.o. 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 Almost all information was obtained through his wife. He is not in any distress at this time. We checked his lab work and Hgb is 6.6. His wife states that he has been having darker tarry stools than usual (he is on an oral iron supplement). He is symptomatic with fatigue, weakness, chills, nausea and not eating or drinking well.  He is currently on Doxycycline for a cough and WBC count is 24.4.  He denies any issue with his LVAD. This month marks 5 years of him having it. His wife has extra batteries with her if needed. He is followed closely by Dr. Gala Romney.  No fever, rash, dizziness, SOB, chest pain, palpitations, abdominal pain or changes in bowel or bladder habits.  No lymphadenopathy found on exam.  No tenderness in his extremities at this time. Neuropathy is unchanged. He chronic swelling in his lower extremities and takes Demadex 40 mg PO daily in AM and can also take again in evening if needed.   ECOG Performance Status: 3 - Symptomatic, >50% confined to bed  Medications:  Allergies as of 04/13/2017      Reactions   Amoxicillin-pot Clavulanate Rash   Levofloxacin Other (See Comments)   Doesn't tolerate   Oxycodone Hives   Ace Inhibitors Rash   Ceftriaxone Rash   Cefuroxime Rash   Chlorhexidine Gluconate Rash   H/o recurrent cellulitis and rash    Clindamycin/lincomycin Rash   Erythematous drug eruption   Hydralazine Itching   Meropenem Rash   Sulfonamide Derivatives Rash   Mother was highly allergic. Patient untested.    Vancomycin Rash   Zosyn  [piperacillin Sod-tazobactam So] Rash   No other systemic symptoms      Medication List       Accurate as of 04/11/2017 12:25 PM. Always use your most recent med list.          acetaminophen 500 MG tablet Commonly known as:  TYLENOL Take 500 mg by mouth every 6 (six) hours as needed.   albuterol 108 (90 Base) MCG/ACT inhaler Commonly known as:  PROVENTIL HFA;VENTOLIN HFA Inhale 1-2 puffs into the lungs as needed for wheezing or shortness of breath.   budesonide-formoterol 160-4.5 MCG/ACT inhaler Commonly known as:  SYMBICORT Inhale 2 puffs into the lungs daily as needed.   calcium carbonate 600 MG Tabs tablet Commonly known as:  OS-CAL Take 600 mg by mouth 2 (two) times daily.   colchicine 0.6 MG tablet Take 0.6 mg by mouth daily.   Darbepoetin Alfa 100 MCG/0.5ML Sosy injection Commonly known as:  ARANESP Inject into the skin.   diclofenac sodium 1 % Gel Commonly known as:  VOLTAREN Apply topically.   doxycycline 100 MG tablet Commonly known as:  VIBRA-TABS Take 1 tablet (100 mg total) by mouth 2 (two) times daily.   ferrous sulfate 325 (65 FE) MG tablet Take by mouth.   hydroxychloroquine 200 MG tablet Commonly known as:  PLAQUENIL Take by mouth daily. 2 tabs with breakfast   Melatonin 3-10 MG Tabs Take 3 mg by mouth at bedtime as needed.   metoprolol tartrate  25 MG tablet Commonly known as:  LOPRESSOR Take 25 mg by mouth 2 (two) times daily.   multivitamin tablet Take 1 tablet by mouth daily.   pantoprazole 40 MG tablet Commonly known as:  PROTONIX Take 40 mg by mouth daily.   polyethylene glycol packet Commonly known as:  MIRALAX / GLYCOLAX Take by mouth.   potassium chloride SA 20 MEQ tablet Commonly known as:  K-DUR,KLOR-CON Take by mouth.   pravastatin 40 MG tablet Commonly known as:  PRAVACHOL Take one by mouth daily   predniSONE 10 MG tablet Commonly known as:  DELTASONE   REMERON PO Take by mouth at bedtime.   silver sulfADIAZINE  1 % cream Commonly known as:  SILVADENE APPLY EXTERNALLY TWICE A DAY   tamsulosin 0.4 MG Caps capsule Commonly known as:  FLOMAX Take 0.4 mg by mouth daily.   torsemide 20 MG tablet Commonly known as:  DEMADEX Take 40 mg by mouth every morning. May take an extra tab in the evening as needed.   torsemide 20 MG tablet Commonly known as:  DEMADEX Take by mouth.   ULORIC 80 MG Tabs Generic drug:  Febuxostat   Vitamin D3 2000 units Tabs Take 1 tablet by mouth daily.   warfarin 2 MG tablet Commonly known as:  COUMADIN Take 2-4 mg by mouth as directed. Managed by [facility] Patient checks at home       Allergies:  Allergies  Allergen Reactions  . Amoxicillin-Pot Clavulanate Rash  . Levofloxacin Other (See Comments)    Doesn't tolerate  . Oxycodone Hives  . Ace Inhibitors Rash  . Ceftriaxone Rash  . Cefuroxime Rash  . Chlorhexidine Gluconate Rash    H/o recurrent cellulitis and rash   . Clindamycin/Lincomycin Rash    Erythematous drug eruption  . Hydralazine Itching  . Meropenem Rash  . Sulfonamide Derivatives Rash    Mother was highly allergic. Patient untested.   . Vancomycin Rash  . Zosyn [Piperacillin Sod-Tazobactam So] Rash    No other systemic symptoms    Past Medical History, Surgical history, Social history, and Family History were reviewed and updated.  Review of Systems: All other 10 point review of systems is negative.   Physical Exam:  vitals were not taken for this visit.  Wt Readings from Last 3 Encounters:  04/16/17 196 lb (88.9 kg)  04/12/17 191 lb 12.8 oz (87 kg)  03/22/17 188 lb (85.3 kg)    Ocular: Sclerae unicteric, pupils equal, round and reactive to light Ear-nose-throat: Oropharynx clear, dentition fair Lymphatic: No cervical, supraclavicular or axillary adenopathy Lungs no rales or rhonchi, good excursion bilaterally Heart constant systolic and diastolic hum with LVAD device Abd soft, nontender, positive bowel sounds, no liver or spleen  tip palpated on exam, no fluid wave MSK no focal spinal tenderness, no joint edema Neuro: non-focal, well-oriented, appropriate affect Breasts: Deferred  Lab Results  Component Value Date   WBC 24.4 (H) 04/29/2017   HGB 6.6 (LL) 04/11/2017   HCT 22.1 (L) 04/12/2017   MCV 95 04/30/2017   PLT 382 04/22/2017   Lab Results  Component Value Date   FERRITIN 167 04/12/2017   IRON 13 (L) 04/12/2017   TIBC 190 (L) 04/12/2017   UIBC 177 04/12/2017   IRONPCTSAT 7 (L) 04/12/2017   Lab Results  Component Value Date   RBC 2.33 (L) 04/12/2017   No results found for: KPAFRELGTCHN, LAMBDASER, KAPLAMBRATIO No results found for: IGGSERUM, IGA, IGMSERUM No results found for: TOTALPROTELP, ALBUMINELP, A1GS, A2GS,  Karn Pickler, SPEI   Chemistry      Component Value Date/Time   NA 136 05/12/2017 1017   K 4.7 May 12, 2017 1017   CL 99 05/12/2017 1017   CO2 24 2017-05-12 1017   BUN 108 (H) 05/12/17 1017   CREATININE 2.8 (H) May 12, 2017 1017      Component Value Date/Time   CALCIUM 8.9 12-May-2017 1017   ALKPHOS 83 05/12/17 1017   AST 25 12-May-2017 1017   ALT 27 05-12-17 1017   BILITOT 0.60 2017/05/12 1017      Impression and Plan: [name] is a very pleasant 76 yo caucasian male with multifactorial anemia as well has significant cardiac history including his having an LVAD. He is here today for an iron infusion but with his being increasingly fatigued, weak, chilled and having darker tarry stools.  Hgb is 6.6 with an MCV of 95. We will get him ready to go to the ED. Unfortunately the ED here in our building is unable to accept LVAD paients.   I spoke with Marylene Land in the ED at [facility] updating her on the patient and let her know he will be coming via [facility] ambulance in the next hour or so. His wife is in agreement with the plan.  I will route this note to Dr. Gala Romney so that he is aware of the situation. Dr. Myna Hidalgo will also follow-up with the patient in  the hospital.   Greater that 50% of his 25 minute or more visit time was spent counseling the patient and his sweet wife and coordinating his care.   Verdie Mosher, NP 06/10/201812:25 PM

## 2017-04-23 NOTE — Progress Notes (Signed)
eLink Physician-Brief Progress Note Patient Name: [name] DOB: 03-05-1941 MRN: [id number]   Date of Service  04/19/2017  HPI/Events of Note  76 yo man with LVAD, admitted with GIB and symptomatic anemia. Currently hemodynamically stable and on RA. No new orders for now.;   eICU Interventions       Intervention Category Evaluation Type: New Patient Evaluation  Suede Greenawalt S. 04/20/2017, 5:41 PM

## 2017-04-23 NOTE — ED Notes (Signed)
Otilio Saber, PA notified that INR resulted at 4.12.

## 2017-04-23 NOTE — Progress Notes (Addendum)
Pharmacy Antibiotic Note  [name] is a 76 y.o. male admitted on 04/22/2017 with sepsis.  Pharmacy has been consulted for vancomycin and cefepime dosing. Of note, he has a long list of antibiotic allergies (rash). Per discussion with MD, will trial vanc and cefepime with Benadryl given before. Patient is afebrile, WBC elevated at 24, CrCl ~ 35ml/min  Plan: Vancomycin 2000 mg x1 then 1000 mg IV every 24 hours.  Goal trough 15-20 mcg/mL. Cefepime 1g IV Q24H Monitor renal function, cultures, allergic reaction  Height: 5\' 7"  (170.2 cm) Weight: 196 lb (88.9 kg) IBW/kg (Calculated) : 66.1  Temp (24hrs), Avg:97.6 F (36.4 C), Min:97.5 F (36.4 C), Max:97.7 F (36.5 C)   Recent Labs Lab 04/21/2017 1017  WBC 24.4*  CREATININE 2.8*    Estimated Creatinine Clearance: 23.1 mL/min (A) (by C-G formula based on SCr of 2.8 mg/dL (H)).    Allergies  Allergen Reactions  . Amoxicillin-Pot Clavulanate Rash  . Levofloxacin Other (See Comments)    Doesn't tolerate  . Oxycodone Hives  . Ace Inhibitors Rash  . Ceftriaxone Rash  . Cefuroxime Rash  . Chlorhexidine Gluconate Rash    H/o recurrent cellulitis and rash   . Clindamycin/Lincomycin Rash    Erythematous drug eruption  . Hydralazine Itching  . Meropenem Rash  . Sulfonamide Derivatives Rash    Mother was highly allergic. Patient untested.   . Vancomycin Rash  . Zosyn [Piperacillin Sod-Tazobactam So] Rash    No other systemic symptoms    Antimicrobials this admission: 5/22 vanc >>  5/22 cefepime >>   Dose adjustments this admission: n/a  Microbiology results: 5/22 BCx:    Thank you for allowing pharmacy to be a part of this patient's care.   Mackie Pai, PharmD PGY1 Pharmacy Resident Rx ED [phone] 04/08/2017 3:36 PM

## 2017-04-23 NOTE — H&P (Signed)
Advanced Heart Failure VAD History and Physical Note   Reason for Admission: Symptomatic Anemia  HPI:    [name] is a 76 y.o. male with CAD with PCI, ICM, afib, hx of CVA in 1987, hx of DVT/PE, HTN, hyperlipidemia, CKD III, and chronic systolic HF. Pt had [name] implant at [facility] on 04/2012.    Pt last seen in HF clinic 04/16/2017 for share care visit from [facility]. Noted fatigue and ongoing deconditioning after recent admission at [facility] for 4 months s/p R ankle fracture.    Pt had been struggling with URI symptoms at home and wife treated with 2 courses of Augmentin.  CXR in clinic 5/15 with mild CHF.  Given 10 day course of doxycycline.   Pt presented to his Hematologist today for Iron infusion. Noted to be lethargic and lab work showed Hgb of 6.6. Per wife pt has had ~ week of dark, tarry stools with worsening fatigue.  Also having chills without fever and more SOB.   [facility] contacted and they were OK with pt staying here for treatment vs transfer. Pt and wife requested to stay at [facility] for time being.   LVAD INTERROGATION:  HeartMate II LVAD:  Flow 5.4 liters/min, speed 9400, power 5.9, PI 3.8.  Numerous PI events. (> 200)  Review of Systems: [y] = yes, [ ]  = no   General: Weight gain [ ] ; Weight loss [ ] ; Anorexia [y]; Fatigue [y]; Fever [ ] ; Chills [y]; Weakness [y]  Cardiac: Chest pain/pressure [ ] ; Resting SOB [ ] ; Exertional SOB [y]; Orthopnea [ ] ; Pedal Edema [ ] ; Palpitations [ ] ; Syncope [ ] ; Presyncope [ ] ; Paroxysmal nocturnal dyspnea[ ]   Pulmonary: Cough [ ] ; Wheezing[ ] ; Hemoptysis[ ] ; Sputum [ ] ; Snoring [ ]   GI: Vomiting[ ] ; Dysphagia[ ] ; Melena[y]; Hematochezia [ ] ; Heartburn[ ] ; Abdominal pain [ ] ; Constipation [ ] ; Diarrhea [ ] ; BRBPR [ ]   GU: Hematuria[ ] ; Dysuria [ ] ; Nocturia[ ]   Vascular: Pain in legs with walking [ ] ; Pain in feet with lying flat [ ] ; Non-healing sores [ ] ; Stroke [ ] ; TIA [ ] ; Slurred speech [ ] ;  Neuro: Headaches[ ] ; Vertigo[ ] ; Seizures[ ] ;  Paresthesias[ ] ;Blurred vision [ ] ; Diplopia [ ] ; Vision changes [ ]   Ortho/Skin: Arthritis [y]; Joint pain [y]; Muscle pain [ ] ; Joint swelling [ ] ; Back Pain [ ] ; Rash [ ]   Psych: Depression[ ] ; Anxiety[ ]   Heme: Bleeding problems [ ] ; Clotting disorders [ ] ; Anemia [ ]   Endocrine: Diabetes [ ] ; Thyroid dysfunction[ ]    Home Medications Prior to Admission medications   Medication Sig Start Date End Date Taking? Authorizing Provider  acetaminophen (TYLENOL) 500 MG tablet Take 500 mg by mouth every 6 (six) hours as needed.    [provider]  albuterol (PROVENTIL HFA;VENTOLIN HFA) 108 (90 BASE) MCG/ACT inhaler Inhale 1-2 puffs into the lungs as needed for wheezing or shortness of breath.    [provider]  budesonide-formoterol (SYMBICORT) 160-4.5 MCG/ACT inhaler Inhale 2 puffs into the lungs daily as needed.    [provider]  calcium carbonate (OS-CAL) 600 MG TABS tablet Take 600 mg by mouth 2 (two) times daily.    [provider]  Cholecalciferol (VITAMIN D3) 2000 UNITS TABS Take 1 tablet by mouth daily.    [provider]  colchicine 0.6 MG tablet Take 0.6 mg by mouth daily.    [provider]  Darbepoetin Alfa (ARANESP) 100 MCG/0.5ML SOSY injection  Inject into the skin. 01/29/17   [provider]  diclofenac sodium (VOLTAREN) 1 % GEL Apply topically. 01/25/17   [provider]  doxycycline (VIBRA-TABS) 100 MG tablet Take 1 tablet (100 mg total) by mouth 2 (two) times daily. 04/16/17   Laurey Morale, MD  ferrous sulfate 325 (65 FE) MG tablet Take by mouth. 01/25/17 01/25/18  [provider]  hydroxychloroquine (PLAQUENIL) 200 MG tablet Take by mouth daily. 2 tabs with breakfast    [provider]  Melatonin 3-10 MG TABS Take 3 mg by mouth at bedtime as needed.    [provider]  metoprolol tartrate (LOPRESSOR) 25 MG tablet Take 25 mg by mouth 2 (two) times daily.    [provider]    Mirtazapine (REMERON PO) Take by mouth at bedtime.    [provider]  Multiple Vitamin (MULTIVITAMIN) tablet Take 1 tablet by mouth daily.    [provider]  pantoprazole (PROTONIX) 40 MG tablet Take 40 mg by mouth daily.    [provider]  polyethylene glycol (MIRALAX / GLYCOLAX) packet Take by mouth. 01/25/17   [provider]  potassium chloride SA (K-DUR,KLOR-CON) 20 MEQ tablet Take by mouth.    [provider]  pravastatin (PRAVACHOL) 40 MG tablet Take one by mouth daily    Serpe, Clide Deutscher, PA-C  predniSONE (DELTASONE) 10 MG tablet  03/08/17   [provider]  silver sulfADIAZINE (SILVADENE) 1 % cream APPLY EXTERNALLY TWICE A DAY 04/01/17   [provider]  tamsulosin (FLOMAX) 0.4 MG CAPS capsule Take 0.4 mg by mouth daily.    [provider]  torsemide (DEMADEX) 20 MG tablet Take 40 mg by mouth every morning. May take an extra tab in the evening as needed.    [provider]  torsemide (DEMADEX) 20 MG tablet Take by mouth. 04/22/17 04/22/18  [provider]  ULORIC 80 MG TABS  01/03/17   [provider]  warfarin (COUMADIN) 2 MG tablet Take 2-4 mg by mouth as directed. Managed by [facility] Patient checks at home    [provider]    Past Medical History: Past Medical History:  Diagnosis Date  . Allergy   . Anemia   . Anemia of chronic renal failure, stage 4 (severe) (HCC) 02/11/2017  . Angina   . Anxiety   . Blood transfusion   . CAD (coronary artery disease)   . Cataract   . CHF (congestive heart failure) (HCC)   . Clotting disorder (HCC)   . COPD (chronic obstructive pulmonary disease) (HCC)   . Diabetes mellitus   . DVT of lower extremity (deep venous thrombosis) (HCC)    left  . Gout   . H/O hiatal hernia   . Heart murmur   . High cholesterol   . Hypertension   . Hypotension    ORTHOSTATIC  . Iron deficiency anemia due to chronic blood loss 02/11/2017  . Jaundice     "@ birth & when I was in 7th grade"  . Malabsorption of iron 02/11/2017  . Mitral valve disorder   . Myocardial infarction (HCC) 1988  . Neuromuscular disorder (HCC)   . Osteoporosis   . Pneumonia 1993; 2009   "both serious cases"  . Pneumonia 2010   "not hospitalized"  . Pulmonary embolism on left (HCC) 1988  . 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 Shortness of breath on exertion   . Sleep apnea   .  Stomach ulcer 1987-1988  . Stroke [facility]) 1987   "brain stem; skin sensitive since then; once in awhile slur"12/13/11)    Past Surgical History: Past Surgical History:  Procedure Laterality Date  . BIV  11/2010   ICD 12/11 MEDTRONIC  . CARDIAC CATHETERIZATION  12/13/11  . CATARACT EXTRACTION W/ INTRAOCULAR LENS  IMPLANT, BILATERAL  1990's  . CORONARY ANGIOPLASTY  1988  . CORONARY ANGIOPLASTY WITH STENT PLACEMENT  2003   "1"  . EXPLORATORY LAPAROTOMY W/ BOWEL RESECTION  1993  . HIP FRACTURE SURGERY Right 2014  . IMPLANTABLE CARDIOVERTER DEFIBRILLATOR GENERATOR CHANGE  08/19/13   MDT Coletta Memos CRT-D generator change by Dr Wilford Grist at [facility].  RV lead with chronically elevated threshold and low R waves not revised.  LV lead with chronic diaphrgramtic stim turned off at [facility].  . leff ventricular assist device (LVAD)  05/02/12   LVAD implanted [facility]  . RIGHT HEART CATHETERIZATION N/A 12/13/2011   Procedure: RIGHT HEART CATH;  Surgeon: Dolores Patty, MD;  Location: [facility] CATH LAB;  Service: Cardiovascular;  Laterality: N/A;  . RIGHT HEART CATHETERIZATION N/A 02/08/2012   Procedure: RIGHT HEART CATH;  Surgeon: Dolores Patty, MD;  Location: [facility] CATH LAB;  Service: Cardiovascular;  Laterality: N/A;  . TRACHEOSTOMY  1993    Family History: No family history on file.  Social History: Social History   Social History  . Marital status: Married    Spouse name: N/A  . Number of children: N/A  . Years of education: N/A   Social History Main Topics  . Smoking status: Former Smoker     Packs/day: 1.00    Years: 30.00    Types: Cigarettes    Quit date: 12/03/1986  . Smokeless tobacco: Never Used  . Alcohol use No  . Drug use: No  . Sexual activity: No   Other Topics Concern  . None   Social History Narrative  . None    Allergies:  Allergies  Allergen Reactions  . Amoxicillin-Pot Clavulanate Rash  . Levofloxacin Other (See Comments)    Doesn't tolerate  . Oxycodone Hives  . Ace Inhibitors Rash  . Ceftriaxone Rash  . Cefuroxime Rash  . Chlorhexidine Gluconate Rash    H/o recurrent cellulitis and rash   . Clindamycin/Lincomycin Rash    Erythematous drug eruption  . Hydralazine Itching  . Meropenem Rash  . Sulfonamide Derivatives Rash    Mother was highly allergic. Patient untested.   . Vancomycin Rash  . Zosyn [Piperacillin Sod-Tazobactam So] Rash    No other systemic symptoms    Objective:    Vital Signs:   Temp:  [97.5 F (36.4 C)-97.7 F (36.5 C)] 97.7 F (36.5 C) (05/22 1359) Pulse Rate:  [57-70] 70 (05/22 1359) Resp:  [16-22] 22 (05/22 1359) BP: (58)/(36) 58/36 (05/22 1000) SpO2:  [97 %-100 %] 100 % (05/22 1359) Weight:  [196 lb (88.9 kg)] 196 lb (88.9 kg) (05/22 1359)   Filed Weights   05/02/2017 1359  Weight: 196 lb (88.9 kg)   Mean arterial Pressure 60  Physical Exam: General:  Elderly and chronically ill appearing. Lethargic.  HEENT: Normal Neck: supple. JVP flat. Carotids 2+ bilat; no bruits. No lymphadenopathy or thyromegaly appreciated. Cor: Mechanical heart sounds with LVAD hum present. Lungs: Diminished Abdomen: soft, nontender, nondistended. No hepatosplenomegaly. No bruits or masses. Good bowel sounds. Driveline: C/D/I; securement device intact and driveline incorporated Extremities: no cyanosis, clubbing, rash, or edema. RLE with wraps.  Neuro: alert & oriented  to person and place. Cranial nerves grossly intact. moves all 4 extremities w/o difficulty. Affect flat  Telemetry: Personally reviewed, NSR with  PVCs  Labs: Basic Metabolic Panel:  Recent Labs Lab 04/14/2017 1017  NA 136  K 4.7  CL 99  CO2 24  GLUCOSE 170*  BUN 108*  CREATININE 2.8*  CALCIUM 8.9    Liver Function Tests:  Recent Labs Lab 04/29/2017 1017  AST 25  ALT 27  ALKPHOS 83  BILITOT 0.60  PROT 5.3*  ALBUMIN 2.2*   No results for input(s): LIPASE, AMYLASE in the last 168 hours. No results for input(s): AMMONIA in the last 168 hours.  CBC:  Recent Labs Lab 04/15/2017 1017  WBC 24.4*  NEUTROABS 22.8*  HGB 6.6*  HCT 22.1*  MCV 95  PLT 382    Cardiac Enzymes: No results for input(s): CKTOTAL, CKMB, CKMBINDEX, TROPONINI in the last 168 hours.  BNP: BNP (last 3 results) No results for input(s): BNP in the last 8760 hours.  ProBNP (last 3 results) No results for input(s): PROBNP in the last 8760 hours.   CBG: No results for input(s): GLUCAP in the last 168 hours.  Coagulation Studies: No results for input(s): LABPROT, INR in the last 72 hours.  Other results: EKG: Not ordered   Imaging:  No results found.    Assessment/Plan   1. Symptomatic Anemia - Hgb 6.6. Stat Type and Screen. Will transfuse 2 UPRBCs now and plan additional as needed. - Dark, tarry stools over the past week.  Will discuss with pt how aggressive he wants to be with evaluation once he is less lethargic. May need to consider EGD/Colon. 2. Chronic systolic CHF: ICM s/p LVAD 04/2012 at [facility] - Significant functional decline over past 6 months, in part due to ankle fracture but suspect progression of his co morbidities as well. He has had LVAD for 5 years this month. - Volume status dry. Give 1000 mL bolus now. > 200 PI events.  - Hold diuretics and toprol for now.  3. Chronic anticoag - INR pending. Hold coumadin with active bleed.  INR goal 1.7 - 2.3. - Will discuss possible heparin parameters with MD 4. Afib - Chronic. On coumadin as above, but now with bleeding.   - Continue to follow closely.  5. Hypotension - In  setting of volume depletion/anemia. Plan as above.  6. Productive cough - with Chills and WBC 24.4 - Cultures sent. May need to broaden ABX. Watch for fever.   - Pt has prednisone in chart but with no dosage recorded. ? If contributing to white count.  - Repeat CXR.  Cover with Vanc/Cefipime for now.  7. Acute on chronic renal failure stage IV - baseline cr 2.4. Now 2.8 Will hydrate.  8. RLE wound - Wrapped. C/D/I. Following with wound clinic. RN to provide care while in house.  If needed can consult WOC.  9. Goals of care - Depending on how well patient does and how aggressive he wants to be with work up, will need to address goals of care this admission with gradual decline over past 6 months.   I reviewed the LVAD parameters from today, and compared the results to the patient's prior recorded data.  No programming changes were made.  The LVAD is functioning within specified parameters.  The nurse will perform LVAD self-test daily.  LVAD interrogation was negative for any significant power changes, alarms or speed drops.  LVAD equipment check completed and is in good working order.  Back-up equipment present.   LVAD education done on emergency procedures and precautions and reviewed exit site care.  Length of Stay: 0  Luane School 04/12/2017, 2:46 PM  VAD Team Pager [phone] (7am - 7am) +++VAD ISSUES ONLY+++   Advanced Heart Failure Team Pager [phone] (M-F; 7a - 4p)  Please contact [facility] for night-coverage after hours (4p -7a ) and weekends on [email or web address] for all non- LVAD Issues  Patient seen and examined with the above-signed Advanced Practice Provider and/or Housestaff. I personally reviewed laboratory data, imaging studies and relevant notes. I independently examined the patient and formulated the important aspects of the plan. I have edited the note to reflect any of my changes or salient points. I have personally discussed the plan with the patient and/or  family.  He has had marked deterioration over the past few months. Now with recurrent GI bleed and symptomatic anemia complicated by AKI and hypotension. Will resuscitate with IVF. Give 2u RBCs and start IV Protonix. Will need to review previous endoscopies at [facility] to decide on need for further studies. WBC also markedly elevated. Will get BCX. Agree with abx as above. Will see how he responds to aggressive therapy. May be approaching point where we need to readdress GOC. Discussed with Jean Rosenthal at [facility] (VAD coordinator).   Arvilla Meres, MD  4:12 PM  .

## 2017-04-23 NOTE — Patient Instructions (Signed)
Anemia, Nonspecific Anemia is a condition in which the concentration of red blood cells or hemoglobin in the blood is below normal. Hemoglobin is a substance in red blood cells that carries oxygen to the tissues of the body. Anemia results in not enough oxygen reaching these tissues. What are the causes? Common causes of anemia include:  Excessive bleeding. Bleeding may be internal or external. This includes excessive bleeding from periods (in women) or from the intestine.  Poor nutrition.  Chronic kidney, thyroid, and liver disease.  Bone marrow disorders that decrease red blood cell production.  Cancer and treatments for cancer.  HIV, AIDS, and their treatments.  Spleen problems that increase red blood cell destruction.  Blood disorders.  Excess destruction of red blood cells due to infection, medicines, and autoimmune disorders. What are the signs or symptoms?  Minor weakness.  Dizziness.  Headache.  Palpitations.  Shortness of breath, especially with exercise.  Paleness.  Cold sensitivity.  Indigestion.  Nausea.  Difficulty sleeping.  Difficulty concentrating. Symptoms may occur suddenly or they may develop slowly. How is this diagnosed? Additional blood tests are often needed. These help your health care provider determine the best treatment. Your health care provider will check your stool for blood and look for other causes of blood loss. How is this treated? Treatment varies depending on the cause of the anemia. Treatment can include:  Supplements of iron, vitamin B12, or folic acid.  Hormone medicines.  A blood transfusion. This may be needed if blood loss is severe.  Hospitalization. This may be needed if there is significant continual blood loss.  Dietary changes.  Spleen removal. Follow these instructions at home: Keep all follow-up appointments. It often takes many weeks to correct anemia, and having your health care provider check on your  condition and your response to treatment is very important. Get help right away if:  You develop extreme weakness, shortness of breath, or chest pain.  You become dizzy or have trouble concentrating.  You develop heavy vaginal bleeding.  You develop a rash.  You have bloody or black, tarry stools.  You faint.  You vomit up blood.  You vomit repeatedly.  You have abdominal pain.  You have a fever or persistent symptoms for more than 2-3 days.  You have a fever and your symptoms suddenly get worse.  You are dehydrated. This information is not intended to replace advice given to you by your health care provider. Make sure you discuss any questions you have with your health care provider. Document Released: 12/27/2004 Document Revised: 05/02/2016 Document Reviewed: 05/15/2013 Elsevier Interactive Patient Education  2017 Elsevier Inc.  

## 2017-04-23 NOTE — ED Notes (Signed)
Attempted report x 2 

## 2017-04-23 NOTE — Progress Notes (Signed)
1245 Dr. Myna Hidalgo wants patient to go to the ED.  Care link here to pick patient up and take him to [facility]  IV capped.  Report given to ED physician per dR. Ennever.

## 2017-04-23 NOTE — ED Notes (Signed)
Attempted report x1. 

## 2017-04-23 NOTE — Progress Notes (Signed)
Patient presents with bandage on Right lower leg and foot reporting he is pateint at wound center.  Wife reports he see Dr. Thurmon Fair at [facility] with [facility].  Dressing left in place and Wound consult ordered as wife reports dressing was changed 04/22/2017 at wound center.  Bedside team will await consult for care orders.    Jacqulyn Cane RN, BSN, CCRN

## 2017-04-23 NOTE — ED Triage Notes (Signed)
Pt brought from MD doctor for abnormal lab value. Pt hbg was 6.6 at MD office. Pt endorses fatigue, dark tarry stools, and exertional SOB for about a week. Pt hbg was 8.7 last week. Pt a&ox4.

## 2017-04-24 DIAGNOSIS — J189 Pneumonia, unspecified organism: Secondary | ICD-10-CM

## 2017-04-24 LAB — BASIC METABOLIC PANEL WITH GFR
Anion gap: 9 (ref 5–15)
BUN: 106 mg/dL — ABNORMAL HIGH (ref 6–20)
CO2: 22 mmol/L (ref 22–32)
Calcium: 8.2 mg/dL — ABNORMAL LOW (ref 8.9–10.3)
Chloride: 108 mmol/L (ref 101–111)
Creatinine, Ser: 2.04 mg/dL — ABNORMAL HIGH (ref 0.61–1.24)
GFR calc Af Amer: 34 mL/min — ABNORMAL LOW (ref >60)
GFR calc non Af Amer: 30 mL/min — ABNORMAL LOW (ref >60)
Glucose, Bld: 84 mg/dL (ref 65–99)
Potassium: 4.1 mmol/L (ref 3.5–5.1)
Sodium: 139 mmol/L (ref 135–145)

## 2017-04-24 LAB — CBC
HCT: 26.2 % — ABNORMAL LOW (ref 39.0–52.0)
Hemoglobin: 8.2 g/dL — ABNORMAL LOW (ref 13.0–17.0)
MCH: 28 pg (ref 26.0–34.0)
MCHC: 31.3 g/dL (ref 30.0–36.0)
MCV: 89.4 fL (ref 78.0–100.0)
Platelets: 302 K/uL (ref 150–400)
RBC: 2.93 MIL/uL — ABNORMAL LOW (ref 4.22–5.81)
RDW: 20.5 % — ABNORMAL HIGH (ref 11.5–15.5)
WBC: 10 K/uL (ref 4.0–10.5)

## 2017-04-24 LAB — PROTIME-INR
INR: 3.94
Prothrombin Time: 39.5 s — ABNORMAL HIGH (ref 11.4–15.2)

## 2017-04-24 LAB — LACTATE DEHYDROGENASE: LDH: 137 U/L (ref 98–192)

## 2017-04-24 LAB — OCCULT BLOOD X 1 CARD TO LAB, STOOL: Fecal Occult Bld: POSITIVE — AB

## 2017-04-24 LAB — RETICULOCYTES: RETICULOCYTE COUNT: 5.7 % — AB (ref 0.6–2.6)

## 2017-04-24 MED ORDER — DIPHENHYDRAMINE HCL 50 MG/ML IJ SOLN
25.0000 mg | Freq: Once | INTRAMUSCULAR | Status: AC
  Administered 2017-04-24: 25 mg via INTRAVENOUS
  Filled 2017-04-24: qty 1

## 2017-04-24 MED ORDER — ENSURE SURGERY PO LIQD
237.0000 mL | ORAL | Status: DC
  Administered 2017-04-24 – 2017-04-30 (×7): 237 mL via ORAL
  Filled 2017-04-24: qty 237

## 2017-04-24 MED ORDER — PANTOPRAZOLE SODIUM 40 MG IV SOLR
40.0000 mg | Freq: Two times a day (BID) | INTRAVENOUS | Status: DC
  Administered 2017-04-24 – 2017-05-02 (×17): 40 mg via INTRAVENOUS
  Filled 2017-04-24 (×17): qty 40

## 2017-04-24 MED ORDER — DIPHENHYDRAMINE HCL 25 MG PO CAPS
25.0000 mg | ORAL_CAPSULE | Freq: Four times a day (QID) | ORAL | Status: DC | PRN
  Administered 2017-04-24 – 2017-05-04 (×14): 25 mg via ORAL
  Filled 2017-04-24 (×14): qty 1

## 2017-04-24 NOTE — Care Management Note (Signed)
Case Management Note Donn Pierini RN, BSN Unit 2W-Case Manager-- 2H coverage [phone]  Patient Details  Name: Mykah Bardin MRN: [id number] Date of Birth: Sep 23, 1941  Subjective/Objective:  LVAD pt admitted with symptomatic anemia- hgb 6.6 - transfused 2 U- hgb now 8.2-                    Action/Plan: PTA pt lived at home with wife- anticipate return home- CM to follow for d/c needs  Expected Discharge Date:                  Expected Discharge Plan:  Home/Self Care  In-House Referral:     Discharge planning Services  CM Consult  Post Acute Care Choice:    Choice offered to:     DME Arranged:    DME Agency:     HH Arranged:    HH Agency:     Status of Service:  In process, will continue to follow  If discussed at Long Length of Stay Meetings, dates discussed:    Additional Comments:  Darrold Span, RN 04/24/2017, 10:17 AM

## 2017-04-24 NOTE — Progress Notes (Signed)
Wife at bedside. States pts home weight (without batteries and controller box) ranges from 184-185 lbs. All questions answered. Will continue to monitor.

## 2017-04-24 NOTE — Progress Notes (Signed)
Admitted 12-May-2017 due to suspected GIB due to black, tarry stools.   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   Vital signs: HR: 70 Doppler Pressure:88 Automatic BP: inaccurate O2 Sat: 97 on RA Wt:88.1 kg     LVAD interrogation reveals:  Speed:9400 Flow: 5.3 Power:  6 PI:4  Alarms: Drive line disconnect, low flow and pump off alarms early this morning. Log files to be sent and reviewed with team. Events:  Multiple PI events Fixed speed: 9400 Low speed limit: 8800  Drive Line: weekly per wife.  Labs:  LDH trend:143>137  INR trend: 4.12>3.94  Anticoagulation Plan: -INR Goal: 2-3 -ASA Dose: none due to  GIB history  Adverse Events on VAD: -07/2013>GIB  Plan/Recommendations:   1. Weekly dressing changes. 2. Charged and personally checked back up controller. 3. Log files to be sent to ensure integrity of equipment due to critical alarms.  Marcellus Scott RN, VAD Coordinator 24/7 pager [phone]

## 2017-04-24 NOTE — Consult Note (Signed)
WOC Nurse wound consult note Reason for Consult: Consult requested for right leg.  Pt states he is followed by the outpatient wound center and they have been applying a "white cream." Wound type: Previous full thickness wound to right posterior leg appears to be almost healed; 2X1.8X.1cm, dry red wound bed, no odor or drainage. Periwound: Surrounded by dry scabbed skin Dressing procedure/placement/frequency: Foam dressing to protect and promote healing.  Pt can resume follow-up at the outpatient wound care center after discharge.  Discussed plan of care and he verbalized understanding. Please re-consult if further assistance is needed.  Thank-you,  Cammie Mcgee MSN, RN, CWOCN, [place], CNS [phone]

## 2017-04-24 NOTE — Progress Notes (Signed)
Advanced Heart Failure VAD Team Note  Subjective:    Admitted from [facility] 30-Apr-2017 with black, tarry stools and Hgb 6.6.  Hgb up to 8.2 this am s/p 2 UPRBCs yesterday.   Remains fatigued. Has been itching since blood transfusion. No BMs since admit so unsure if still bleeding.   LVAD INTERROGATION:  HeartMate II LVAD:  Flow 5.3 liters/min, speed 9400, power 6, PI 4.3. Frequent PI events.  Driveline disconnected with low flow x 3 and pump stop x 1 this am.   Objective:    Vital Signs:   Temp:  [97.1 F (36.2 C)-98.2 F (36.8 C)] 98.1 F (36.7 C) (05/23 0741) Pulse Rate:  [53-70] 70 (05/23 0700) Resp:  [11-25] 18 (05/23 0700) BP: (58-94)/(29-63) 72/29 (05/22 1930) SpO2:  [96 %-100 %] 100 % (05/23 0700) Weight:  [194 lb 3.6 oz (88.1 kg)-196 lb (88.9 kg)] 194 lb 3.6 oz (88.1 kg) (05/23 0500) Last BM Date: 04-30-2017 Mean arterial Pressure 88  Intake/Output:   Intake/Output Summary (Last 24 hours) at 04/24/17 0804 Last data filed at 04/24/17 0741  Gross per 24 hour  Intake          1420.83 ml  Output              975 ml  Net           445.83 ml     Physical Exam: General:  Elderly, fatigued, and chronically ill appearing.  HEENT: Normal Neck: Supple. JVP not elevated. Carotids 2+ bilat; no bruits. No lymphadenopathy or thyromegaly appreciated. Cor: Mechanical heart sounds with LVAD hum present. Lungs: Diminished Abdomen: Soft, nontender, nondistended. No hepatosplenomegaly. No bruits or masses. Good bowel sounds. Driveline: C/D/I; securement device intact and driveline incorporated Extremities: no cyanosis, clubbing, rash, or edema. RLE with wrap. Neuro: alert & orientedx3, cranial nerves grossly intact. moves all 4 extremities w/o difficulty. Affect flat  Telemetry: Personally reviewed, NSR 60-70s  Labs: Basic Metabolic Panel:  Recent Labs Lab 04/30/2017 1017 2017-04-30 1455 04/24/17 0533  NA 136 136 139  K 4.7 4.7 4.1  CL 99 104 108  CO2 24 25 22   GLUCOSE 170*  137* 84  BUN 108* 115* 106*  CREATININE 2.8* 2.58* 2.04*  CALCIUM 8.9 8.7* 8.2*    Liver Function Tests:  Recent Labs Lab Apr 30, 2017 1017 Apr 30, 2017 1455  AST 25 19  ALT 27 23  ALKPHOS 83 79  BILITOT 0.60 0.6  PROT 5.3* 5.0*  ALBUMIN 2.2* 2.1*   No results for input(s): LIPASE, AMYLASE in the last 168 hours. No results for input(s): AMMONIA in the last 168 hours.  CBC:  Recent Labs Lab Apr 30, 2017 1017 Apr 30, 2017 2027 04/24/17 0533  WBC 24.4*  --  10.0  NEUTROABS 22.8*  --   --   HGB 6.6* 8.4* 8.2*  HCT 22.1* 27.6* 26.2*  MCV 95  --  89.4  PLT 382  --  302    INR:  Recent Labs Lab Apr 30, 2017 1455 04/24/17 0533  INR 4.12* 3.94    Other results:  EKG:   Imaging: Dg Chest Portable 1 View  Result Date: 30-Apr-2017 CLINICAL DATA:  76 y/o  M; cough. EXAM: PORTABLE CHEST 1 VIEW COMPARISON:  04/16/2017 chest radiograph FINDINGS: Stable cardiac silhouette given projection and technique. Aortic atherosclerosis with calcification. Stable 19 mm nodular density projecting over right mid lung zone. 3 lead AICD. Partially visualized LVAD device noted. Median sternotomy wires are intact. Ill-defined opacity with left upper lobe lingula. No pleural effusion or pneumothorax. No  acute osseous abnormality is evident. IMPRESSION: 1. Ill-defined opacity in left upper lobe lingula may represent atelectasis or infiltrate. 2. Stable 19 mm density projecting over right mid lung zone. 3. 3 lead AICD and LVAD device. Electronically Signed   By: Mitzi Hansen M.D.   On: 04/24/17 16:27      Medications:     Scheduled Medications: . calcium carbonate  1 tablet Oral BID WC  . cholecalciferol  1,000 Units Oral Daily  . febuxostat  80 mg Oral Daily  . ferrous sulfate  325 mg Oral Q breakfast  . hydroxychloroquine  400 mg Oral QPC breakfast  . mouth rinse  15 mL Mouth Rinse q12n4p  . mirtazapine  7.5 mg Oral QHS  . mometasone-formoterol  2 puff Inhalation BID  . pantoprazole  (PROTONIX) IV  40 mg Intravenous QHS  . pravastatin  40 mg Oral Daily  . tamsulosin  0.4 mg Oral Daily     Infusions: . ceFEPime (MAXIPIME) IV Stopped (04/24/2017 2317)  . vancomycin       PRN Medications:  acetaminophen, albuterol, Melatonin, ondansetron (ZOFRAN) IV   Assessment/Plan   1. Symptomatic Anemia - Hgb improved to 8.2 from 6.6 with 2UPRBCs - Pt had BM just after my exam.  Will check FOBT.  - May need to involve GI if Hgb does not stabilize.  2. Chronic systolic CHF due to ICM s/p LVAD 04/2012 at [facility] for DT - Volume status was dry on admit. Improved with blood and fluid bolus. - Holding diuretics and toprol for now with hypotensive and numerous PI events.  3. Chronic anticoag - INR supra therapeutic on admit and remains high. Holding with GI bleed.  - Goal INR 1.7-2.3.  INR 3.94 this am.   4. Chronic Afib - Stable. Rate controlled - On coumadin, but on hold currently with GI bleed.  5. Hypotension/Low MAPs - Improved with blood and IVF 6. Productive cough - Afebrile. WBC down to 10 from 24 yesterday. Pt had prednisone ordered but no dose specified. May have been contributing.  - Blood culture pending. On vanc/cefipime 7. ARF on CKD IV - Baseline ~ 2.2. Much improved with fluid resuscitation and blood.  8. RLE wound - WOC consult made  GI summary from [facility] 11/06/16 "Intermittent anemia requiring PRBC, developed melenic stools prompting GI consult. Push enteroscopy did not reveal active bleeding. 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 GI recommended no further intervention. After melenic stools resolved resumed warfarin after holding for 10 days. Continued aranesp injections and oral iron however continued to require PRBC. Re-engaged GI who recommended repeat video capsule given down trending H/H. 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 Of note, during push enteroscopy, patient found to have a partial SBO within a chronic reducible ventral hernia with stool, debris, and both capsules inside. This was re-visualized on a CT abdomen scan. Surgery was requested, but believed this was a chronic functional SBO and surgical repair was not indicated. Remained asymptomatic with no nausea, vomiting, or abdominal pain and was having regular bowel movements. Patient has 3 capsules within this dilated loop of small bowel, but as patient remains asymptomatic, GI states okay to leave in. Iron studies 12/3 showed iron stores normal, so patient was continued on oral iron and Aranesp injections. Received total of 7 units PRBC  this admission."  I reviewed the LVAD parameters from today, and compared the results to the patient's prior recorded data.  No programming changes were made.  The LVAD is functioning within specified parameters.  The nurse performs LVAD self-test daily. LVAD equipment check completed and is in good working order.  Back-up equipment present. LVAD education done on emergency procedures and precautions and reviewed exit site care.  Length of Stay: 1  Luane School 04/24/2017, 8:04 AM  VAD Team --- VAD ISSUES ONLY--- Pager [phone] (7am - 7am)  Advanced Heart Failure Team  Pager [phone] (M-F; 7a - 4p)  Please contact [facility] for night-coverage after hours (4p -7a ) and weekends on [email or web address]  Patient seen and examined with the above-signed Advanced Practice Provider and/or Housestaff. I personally reviewed laboratory data, imaging studies and relevant notes. I independently examined the patient and formulated the important aspects of the plan. I have edited the note to reflect any of my changes or salient points. I have personally discussed the plan with the patient and/or family.  Remains tenuous but more alert this am. Hgb improved. No  evidence of ongoing bleeding. Reviewed GI reports from [facility]. Notable for large AVM on capsule which could not be located on push enteroscopy. Will continue IV protonix. Will not consult GI yet. If rebleeds will consult.   WBC much improved with abx. Treating empirically for possible PNA. Considering narrowing abx tomorrow if cx negative.   VAD parameters improved. PI events way down.  Remains weak. Will need PT.   WOC seeing as well.   Arvilla Meres, MD  1:56 PM

## 2017-04-24 NOTE — Evaluation (Signed)
Physical Therapy Evaluation Patient Details Name: [name] MRN: [id number] DOB: 02/24/1941 Today's Date: 04/24/2017   History of Present Illness  Hayden Pavich [name] a 76 y.o.malewith CAD with PCI, ICM, afib, hx of CVA in 1987, hx of DVT/PE, HTN, hyperlipidemia, CKD III, and chronic systolic HF. Pt had [name] implant at [facility] on 04/2012. Admitted from [facility] 04/02/2017 with black, tarry stools and Hgb 6.6.  Clinical Impression  Pt admitted with above. Pt very deconditioned and noted significant drop in HgB. Pt was functioning at a supervision level for mobility and minA level for ADLs. Pt to benefit from CIR upon d/c to achieve supervision level of function for safe transition home with spouse.    Follow Up Recommendations CIR    Equipment Recommendations  None recommended by PT    Recommendations for Other Services Rehab consult     Precautions / Restrictions Precautions Precautions: Fall;Other (comment) Precaution Comments: LVAD, anemia Restrictions Weight Bearing Restrictions: No      Mobility  Bed Mobility Overal bed mobility: Needs Assistance Bed Mobility: Supine to Sit     Supine to sit: Max assist;+2 for physical assistance     General bed mobility comments: pt attempted to move LEs towards EOB, maxAx2 for trunk elevation and to bring hips to EOB  Transfers Overall transfer level: Needs assistance Equipment used:  (2 person lift with gait belt ) Transfers: Sit to/from [employer] Transfers Sit to Stand: Max assist;+2 physical assistance Stand pivot transfers: Max assist;+2 physical assistance       General transfer comment: pt with retropulsion due to fear of falling, maxA to facilitate weight shifting to complete std pvt to chair  Ambulation/Gait                Stairs            Wheelchair Mobility    Modified Rankin (Stroke Patients Only)       Balance Overall balance assessment: Needs assistance Sitting-balance  support: Feet supported;Bilateral upper extremity supported Sitting balance-Leahy Scale: Poor Sitting balance - Comments: pt fatigued and unable to hold self up at EOB Postural control: Posterior lean Standing balance support: Bilateral upper extremity supported;During functional activity Standing balance-Leahy Scale: Zero Standing balance comment: required maxAx2 to achieve and maintain standing                             Pertinent Vitals/Pain Pain Assessment: Faces Faces Pain Scale: Hurts even more Pain Location: general body with movement Pain Descriptors / Indicators: Sore    Home Living Family/patient expects to be discharged to:: Private residence Living Arrangements: Spouse/significant other Available Help at Discharge: Family;Available 24 hours/day Type of Home: House Home Access: Ramped entrance     Home Layout: One level Home Equipment: Walker - 2 wheels;Shower seat      Prior Function Level of Independence: Needs assistance   Gait / Transfers Assistance Needed: used a RW  ADL's / Homemaking Assistance Needed: wife has been assisting with bathing and dressing        Hand Dominance   Dominant Hand: Right    Extremity/Trunk Assessment   Upper Extremity Assessment Upper Extremity Assessment: Generalized weakness    Lower Extremity Assessment Lower Extremity Assessment: Generalized weakness    Cervical / Trunk Assessment Cervical / Trunk Assessment: Kyphotic  Communication   Communication: No difficulties  Cognition Arousal/Alertness: Lethargic Behavior During Therapy: Flat affect  General Comments: pt followed commands however had minimal eye opening and was fearful of falling      General Comments General comments (skin integrity, edema, etc.): pt with R LE wound being monitored by WOC and noted statis on L LE    Exercises     Assessment/Plan    PT Assessment Patient needs  continued PT services  PT Problem List Decreased strength;Decreased activity tolerance;Decreased balance;Decreased mobility;Decreased knowledge of use of DME;Cardiopulmonary status limiting activity       PT Treatment Interventions DME instruction;Gait training;Stair training;Functional mobility training;Therapeutic exercise;Therapeutic activities;Balance training    PT Goals (Current goals can be found in the Care Plan section)  Acute Rehab PT Goals Patient Stated Goal: didn't state PT Goal Formulation: With patient/family Time For Goal Achievement: 05/01/17 Potential to Achieve Goals: Good    Frequency Min 3X/week   Barriers to discharge Decreased caregiver support wife in excellent health and can provide 24/7 minA but not maxAx2    Co-evaluation               AM-PAC PT "6 Clicks" Daily Activity  Outcome Measure Difficulty turning over in bed (including adjusting bedclothes, sheets and blankets)?: A Lot Difficulty moving from lying on back to sitting on the side of the bed? : A Lot Difficulty sitting down on and standing up from a chair with arms (e.g., wheelchair, bedside commode, etc,.)?: A Lot Help needed moving to and from a bed to chair (including a wheelchair)?: A Lot Help needed walking in hospital room?: Total Help needed climbing 3-5 steps with a railing? : Total 6 Click Score: 10    End of Session Equipment Utilized During Treatment: Gait belt Activity Tolerance: Patient limited by fatigue Patient left: in chair;with call bell/phone within reach;with family/visitor present Nurse Communication: Mobility status (RN present for transfer) PT Visit Diagnosis: Difficulty in walking, not elsewhere classified (R26.2)    Time: 5072-2575 PT Time Calculation (min) (ACUTE ONLY): 24 min   Charges:   PT Evaluation $PT Eval Moderate Complexity: 1 Procedure PT Treatments $Therapeutic Activity: 8-22 mins   PT G Codes:        [name], PT, DPT Pager #:  [phone] Office #: [phone]   [name] M [name] 04/24/2017, 3:06 PM

## 2017-04-25 ENCOUNTER — Inpatient Hospital Stay (HOSPITAL_COMMUNITY): Payer: Medicare Other

## 2017-04-25 ENCOUNTER — Other Ambulatory Visit: Payer: Self-pay

## 2017-04-25 DIAGNOSIS — R6521 Severe sepsis with septic shock: Secondary | ICD-10-CM

## 2017-04-25 DIAGNOSIS — A419 Sepsis, unspecified organism: Secondary | ICD-10-CM

## 2017-04-25 LAB — CBC
HCT: 27.2 % — ABNORMAL LOW (ref 39.0–52.0)
HCT: 23.9 % — ABNORMAL LOW (ref 39.0–52.0)
HEMOGLOBIN: 8.2 g/dL — AB (ref 13.0–17.0)
HEMOGLOBIN: 7.2 g/dL — AB (ref 13.0–17.0)
MCH: 27.2 pg (ref 26.0–34.0)
MCH: 27.6 pg (ref 26.0–34.0)
MCHC: 30.1 g/dL (ref 30.0–36.0)
MCHC: 30.1 g/dL (ref 30.0–36.0)
MCV: 90.1 fL (ref 78.0–100.0)
MCV: 91.6 fL (ref 78.0–100.0)
Platelets: 283 10*3/uL (ref 150–400)
Platelets: 233 10*3/uL (ref 150–400)
RBC: 3.02 MIL/uL — AB (ref 4.22–5.81)
RBC: 2.61 MIL/uL — ABNORMAL LOW (ref 4.22–5.81)
RDW: 20.7 % — ABNORMAL HIGH (ref 11.5–15.5)
RDW: 20.7 % — ABNORMAL HIGH (ref 11.5–15.5)
WBC: 30.3 10*3/uL — AB (ref 4.0–10.5)
WBC: 23.4 10*3/uL — ABNORMAL HIGH (ref 4.0–10.5)

## 2017-04-25 LAB — COOXEMETRY PANEL
CARBOXYHEMOGLOBIN: 1.4 % (ref 0.5–1.5)
Carboxyhemoglobin: 2.3 % — ABNORMAL HIGH (ref 0.5–1.5)
METHEMOGLOBIN: 0.8 % (ref 0.0–1.5)
Methemoglobin: 1.3 % (ref 0.0–1.5)
O2 Saturation: 60.5 %
O2 Saturation: 96.1 %
Total hemoglobin: 6.9 g/dL — CL (ref 12.0–16.0)
Total hemoglobin: 7.9 g/dL — ABNORMAL LOW (ref 12.0–16.0)

## 2017-04-25 LAB — BASIC METABOLIC PANEL
ANION GAP: 6 (ref 5–15)
ANION GAP: 5 (ref 5–15)
BUN: 102 mg/dL — ABNORMAL HIGH (ref 6–20)
BUN: 71 mg/dL — ABNORMAL HIGH (ref 6–20)
CHLORIDE: 107 mmol/L (ref 101–111)
CO2: 24 mmol/L (ref 22–32)
CO2: 18 mmol/L — AB (ref 22–32)
CREATININE: 2.54 mg/dL — AB (ref 0.61–1.24)
Calcium: 8 mg/dL — ABNORMAL LOW (ref 8.9–10.3)
Calcium: 6.9 mg/dL — ABNORMAL LOW (ref 8.9–10.3)
Chloride: 116 mmol/L — ABNORMAL HIGH (ref 101–111)
Creatinine, Ser: 1.77 mg/dL — ABNORMAL HIGH (ref 0.61–1.24)
GFR calc Af Amer: 41 mL/min — ABNORMAL LOW (ref >60)
GFR calc non Af Amer: 23 mL/min — ABNORMAL LOW (ref >60)
GFR calc non Af Amer: 35 mL/min — ABNORMAL LOW (ref >60)
GFR, EST AFRICAN AMERICAN: 26 mL/min — AB (ref >60)
GLUCOSE: 161 mg/dL — AB (ref 65–99)
Glucose, Bld: 102 mg/dL — ABNORMAL HIGH (ref 65–99)
Potassium: 5 mmol/L (ref 3.5–5.1)
Potassium: 4 mmol/L (ref 3.5–5.1)
SODIUM: 139 mmol/L (ref 135–145)
Sodium: 137 mmol/L (ref 135–145)

## 2017-04-25 LAB — LACTIC ACID, PLASMA
LACTIC ACID, VENOUS: 3 mmol/L — AB (ref 0.5–1.9)
Lactic Acid, Venous: 1.3 mmol/L (ref 0.5–1.9)

## 2017-04-25 LAB — LACTATE DEHYDROGENASE
LDH: 200 U/L — ABNORMAL HIGH (ref 98–192)
LDH: 147 U/L (ref 98–192)

## 2017-04-25 LAB — PROCALCITONIN: PROCALCITONIN: 0.92 ng/mL

## 2017-04-25 LAB — PROTIME-INR
INR: 3.99
PROTHROMBIN TIME: 40 s — AB (ref 11.4–15.2)

## 2017-04-25 LAB — PREPARE RBC (CROSSMATCH)

## 2017-04-25 MED ORDER — DEXTROSE 5 % IV SOLN
0.0000 ug/min | INTRAVENOUS | Status: DC
  Administered 2017-04-25: 3 ug/min via INTRAVENOUS
  Administered 2017-04-25 – 2017-04-26 (×2): 2 ug/min via INTRAVENOUS
  Filled 2017-04-25 (×3): qty 4

## 2017-04-25 MED ORDER — VASOPRESSIN 20 UNIT/ML IV SOLN
0.0300 [IU]/min | INTRAVENOUS | Status: DC
  Administered 2017-04-25 – 2017-04-26 (×3): 0.03 [IU]/min via INTRAVENOUS
  Filled 2017-04-25 (×4): qty 2

## 2017-04-25 MED ORDER — SODIUM CHLORIDE 0.9 % IV SOLN
Freq: Once | INTRAVENOUS | Status: AC
  Administered 2017-05-03: 10 mL/h via INTRAVENOUS

## 2017-04-25 MED ORDER — AZTREONAM 1 G IJ SOLR
1.0000 g | Freq: Three times a day (TID) | INTRAMUSCULAR | Status: DC
  Filled 2017-04-25: qty 1

## 2017-04-25 MED ORDER — AZTREONAM 1 G IJ SOLR
1.0000 g | Freq: Three times a day (TID) | INTRAMUSCULAR | Status: DC
  Administered 2017-04-25 – 2017-04-30 (×15): 1 g via INTRAVENOUS
  Filled 2017-04-25 (×18): qty 1

## 2017-04-25 MED ORDER — SODIUM CHLORIDE 0.9% FLUSH: 10.0000 mL | INTRAVENOUS | Status: DC | PRN

## 2017-04-25 MED ORDER — FEBUXOSTAT 40 MG PO TABS
40.0000 mg | ORAL_TABLET | Freq: Every day | ORAL | Status: DC
  Administered 2017-04-25 – 2017-05-10 (×16): 40 mg via ORAL
  Filled 2017-04-25 (×18): qty 1

## 2017-04-25 MED ORDER — SODIUM CHLORIDE 0.9 % IV BOLUS (SEPSIS)
500.0000 mL | Freq: Once | INTRAVENOUS | Status: AC
  Administered 2017-04-25: 500 mL via INTRAVENOUS

## 2017-04-25 MED ORDER — SODIUM CHLORIDE 0.9% FLUSH
10.0000 mL | Freq: Two times a day (BID) | INTRAVENOUS | Status: DC
  Administered 2017-04-25 – 2017-04-27 (×4): 10 mL
  Administered 2017-04-27: 30 mL
  Administered 2017-04-28: 10 mL
  Administered 2017-04-28: 30 mL
  Administered 2017-04-29 – 2017-05-08 (×16): 10 mL
  Administered 2017-05-08: 20 mL
  Administered 2017-05-09 – 2017-05-10 (×4): 10 mL
  Administered 2017-05-11: 40 mL
  Administered 2017-05-11 – 2017-05-13 (×4): 10 mL

## 2017-04-25 MED ORDER — SODIUM CHLORIDE 0.9 % IV BOLUS (SEPSIS)
2000.0000 mL | Freq: Once | INTRAVENOUS | Status: AC
  Administered 2017-04-25: 2000 mL via INTRAVENOUS

## 2017-04-25 NOTE — Progress Notes (Signed)
Admitted 2017/05/05 due to suspected GIB due to black, tarry stools.   HeartMate II LVAD implated on 04/2012 by [facility].   Vital signs: HR: 72 Doppler Pressure:78 Automatic BP: 82/60 (67) O2 Sat: 97 on RA Wt:88.1 kg  >88.9 kg   LVAD interrogation reveals:  Speed:9400 Flow: 6.2 Power:  6 PI:4 .5 Alarms: none  Events:  Multiple PI events Fixed speed: 9400 Low speed limit: 8800  Drive Line: weekly per wife.  Labs:  LDH trend:143>137>200  INR trend: 4.12>3.94>3.99  Anticoagulation Plan: -INR Goal: 2-3 -ASA Dose: none due to  GIB history  Adverse Events on VAD: -07/2013>GIB  Plan/Recommendations:   1. Weekly dressing changes. 2. Possible need for GI input.  Marcellus Scott RN, VAD Coordinator 24/7 pager [phone]

## 2017-04-25 NOTE — Progress Notes (Signed)
PT Cancellation Note  Patient Details Name: [name] MRN: [id number] DOB: 1941/09/09   Cancelled Treatment:    Reason Eval/Treat Not Completed: Medical issues which prohibited therapy   [name] [facility] 04/25/2017, 8:52 AM Skip Mayer PT [phone]

## 2017-04-25 NOTE — Progress Notes (Signed)
Peripherally Inserted Central Catheter/Midline Placement  The IV Nurse has discussed with the patient and/or persons authorized to consent for the patient, the purpose of this procedure and the potential benefits and risks involved with this procedure.  The benefits include less needle sticks, lab draws from the catheter, and the patient may be discharged home with the catheter. Risks include, but not limited to, infection, bleeding, blood clot (thrombus formation), and puncture of an artery; nerve damage and irregular heartbeat and possibility to perform a PICC exchange if needed/ordered by physician.  Alternatives to this procedure were also discussed.  Bard Power PICC patient education guide, fact sheet on infection prevention and patient information card has been provided to patient /or left at bedside.    PICC/Midline Placement Documentation        [name] 04/25/2017, 7:51 PM

## 2017-04-25 NOTE — Progress Notes (Signed)
Advanced Heart Failure VAD Team Note  Subjective:    Admitted from [facility] 04/25/2017 with black, tarry stools and Hgb 6.6.  Hgb stable 8.2,  WBC back up 30.3.   FOBT 04/24/17 positive.   Feeling very run down.  No energy. Generalized discomfort.  Complaining of non specific pain. Lightheaded.  LVAD INTERROGATION:  HeartMate II LVAD:  Flow 6.2 liters/min, speed 9400, power 6, PI 4.5. > 240 PI events in past 12 hours alone.   Objective:    Vital Signs:   Temp:  [98 F (36.7 C)-100 F (37.8 C)] 98 F (36.7 C) (05/24 0700) Pulse Rate:  [27-72] 61 (05/24 0700) Resp:  [11-21] 20 (05/24 0800) BP: (64)/(29-54) 64/29 (05/24 0800) SpO2:  [93 %-100 %] 100 % (05/24 0800) Last BM Date: 04/24/17 Mean arterial Pressure 68 (Doppler) Checked personally, suspect modified systolic. Actual mean in 40s Intake/Output:   Intake/Output Summary (Last 24 hours) at 04/25/17 0821 Last data filed at 04/25/17 0800  Gross per 24 hour  Intake              250 ml  Output              390 ml  Net             -140 ml     Physical Exam: GENERAL: Pale. Elderly and fatigued appearing.   HEENT: Normal. NECK: Supple, JVP flat cm. Carotids OK.  CARDIAC:  Mechanical heart sounds with LVAD hum present.  LUNGS:  Diminished.  ABDOMEN: Slight tenderness, no HSM. No bruits or masses. +BS  LVAD exit site: Well-healed and incorporated. Dressing dry and intact. No erythema or drainage. Stabilization device present and accurately applied. Driveline dressing changed daily per sterile technique. EXTREMITIES:  Warm and dry. No cyanosis, clubbing, rash, or edema.  NEUROLOGIC:  Alert & oriented x 3. Cranial nerves grossly intact. Moves all 4 extremities w/o difficulty. Affect  Flat.  Telemetry: Personally reviewed, V paced 60s   Labs: Basic Metabolic Panel:  Recent Labs Lab 04/14/2017 1017 04/16/2017 1455 04/24/17 0533 04/25/17 0255  NA 136 136 139 137  K 4.7 4.7 4.1 5.0  CL 99 104 108 107  CO2 24 25 22 24   GLUCOSE  170* 137* 84 102*  BUN 108* 115* 106* 102*  CREATININE 2.8* 2.58* 2.04* 2.54*  CALCIUM 8.9 8.7* 8.2* 8.0*    Liver Function Tests:  Recent Labs Lab 04/13/2017 1017 04/14/2017 1455  AST 25 19  ALT 27 23  ALKPHOS 83 79  BILITOT 0.60 0.6  PROT 5.3* 5.0*  ALBUMIN 2.2* 2.1*   No results for input(s): LIPASE, AMYLASE in the last 168 hours. No results for input(s): AMMONIA in the last 168 hours.  CBC:  Recent Labs Lab 04/06/2017 1017 04/14/2017 2027 04/24/17 0533 04/25/17 0255  WBC 24.4*  --  10.0 30.3*  NEUTROABS 22.8*  --   --   --   HGB 6.6* 8.4* 8.2* 8.2*  HCT 22.1* 27.6* 26.2* 27.2*  MCV 95  --  89.4 90.1  PLT 382  --  302 283    INR:  Recent Labs Lab 04/22/2017 1455 04/24/17 0533 04/25/17 0255  INR 4.12* 3.94 3.99    Other results:   Imaging: Dg Chest Portable 1 View  Result Date: 04/19/2017 CLINICAL DATA:  76 y/o  M; cough. EXAM: PORTABLE CHEST 1 VIEW COMPARISON:  04/16/2017 chest radiograph FINDINGS: Stable cardiac silhouette given projection and technique. Aortic atherosclerosis with calcification. Stable 19 mm nodular density projecting over  right mid lung zone. 3 lead AICD. Partially visualized LVAD device noted. Median sternotomy wires are intact. Ill-defined opacity with left upper lobe lingula. No pleural effusion or pneumothorax. No acute osseous abnormality is evident. IMPRESSION: 1. Ill-defined opacity in left upper lobe lingula may represent atelectasis or infiltrate. 2. Stable 19 mm density projecting over right mid lung zone. 3. 3 lead AICD and LVAD device. Electronically Signed   By: Mitzi Hansen M.D.   On: 19-May-2017 16:27     Medications:     Scheduled Medications: . calcium carbonate  1 tablet Oral BID WC  . cholecalciferol  1,000 Units Oral Daily  . febuxostat  80 mg Oral Daily  . feeding supplement  237 mL Oral Q24H  . ferrous sulfate  325 mg Oral Q breakfast  . hydroxychloroquine  400 mg Oral QPC breakfast  . mouth rinse  15 mL  Mouth Rinse q12n4p  . mirtazapine  7.5 mg Oral QHS  . mometasone-formoterol  2 puff Inhalation BID  . pantoprazole (PROTONIX) IV  40 mg Intravenous Q12H  . pravastatin  40 mg Oral Daily  . tamsulosin  0.4 mg Oral Daily    Infusions: . ceFEPime (MAXIPIME) IV Stopped (04/24/17 2050)  . vancomycin Stopped (04/24/17 2150)    PRN Medications: acetaminophen, albuterol, diphenhydrAMINE, Melatonin, ondansetron (ZOFRAN) IV   Assessment/Plan   1. Symptomatic Anemia - Hgb improved to 8.2 from 6.6 with 2UPRBCs - Pt had BM just after my exam.  Will check FOBT.  - May need to involve GI if Hgb does not stabilize.  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 Noted to have functional SBO with 3 retained capsule cameras.  2. Chronic systolic CHF due to ICM s/p LVAD 04/2012 at [facility] for DT - Volume status remains dry.  MAPs soft.  - Continue to hold diuretics and BB.  3. Chronic anticoag - INR remains supratherapeutic at 3.99. Goal INR 1.7-2.3.   4. Chronic Afib - Stable. Rate controlled - On coumadin, but on hold currently with GI bleed.  5. Hypotension/Low MAPs - MAPs quite soft. BP low with shock.  6. Productive cough - Afebrile. WBC down to 10 yesterday but back up to 30 this am.  ? Accuracy of yesterdays as was 24k on admit.  Likely septic.  - BCx NGTD (<24 hrs).   7. 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  Not far from baseline.  8. RLE wound - WOC following.  9. Shock - From unclear source. Check stat Lactic acid.  - UA negative. Will send culture.  - BCx pending.  - Change ABX Vanc and Aztreonam with zosyn allergy. - Give 2 L fluid now - Add vasopressin 0.03  I reviewed the LVAD parameters from today, and compared the results to the patient's prior recorded data.  No programming changes were made.  The LVAD is functioning within specified parameters.  The nurse performs LVAD  self-test daily.  LVAD interrogation was negative for any significant power changes or alarms. LVAD equipment check completed and is in good working order.  Back-up equipment present.   LVAD education done on emergency procedures and precautions and reviewed exit site care.  Length of Stay: 2  Luane School 04/25/2017, 8:21 AM  VAD Team --- VAD ISSUES ONLY--- Pager [phone] (7am - 7am)  Advanced Heart Failure Team  Pager [phone] (M-F; 7a -  4p)  Please contact [facility] for night-coverage after hours (4p -7a ) and weekends on [email or web address]  Agree with above.  He appears to be in septic shock this am with profound hypotension and rising white count. Multiple PI events on VAD. No evidence of recurrent bleed.   Will give 2L NS and start vasopressin. Continue to follow BCX. (NGTD). Check urine. Will change abx to vanc/aztreonam due to itching with cephalosporin.  Continue to watch closely for bleeding.   Exam  Pale uncomfortable Cor LVAD hum  Lungs clear Ab soft NT driveline site clear No edema  CRITICAL CARE Performed by: Arvilla Meres  Total critical care time: 35 minutes  Critical care time was exclusive of separately billable procedures and treating other patients.  Critical care was necessary to treat or prevent imminent or life-threatening deterioration.  Critical care was time spent personally by me (independent of midlevel providers or residents) on the following activities: development of treatment plan with patient and/or surrogate as well as nursing, discussions with consultants, evaluation of patient's response to treatment, examination of patient, obtaining history from patient or surrogate, ordering and performing treatments and interventions, ordering and review of laboratory studies, ordering and review of radiographic studies, pulse oximetry and re-evaluation of patient's condition.  Arvilla Meres, MD  1:38 PM

## 2017-04-25 NOTE — Progress Notes (Signed)
Pharmacy Antibiotic Note  [name] is a 76 y.o. male admitted on 02-May-2017 with sepsis.  Pharmacy has been consulted for vancomycin and aztreonam dosing. Of note, he has a long list of antibiotic allergies (rash) and has been continually itching this admission on vanc and cefepime.. Per discussion with MD, will trial switching cefepime to aztreonam to see if it helps. PRN benadryl ordered. Patient is afebrile, WBC elevated at 30, CrCl ~ 63ml/min  Plan: Vancomycin 2000 mg x1 then 1000 mg IV every 24 hours.  Goal trough 15-20 mcg/mL.  VT at steady state Saturday 5/24 PM Aztreonam 1g IV Q8H Monitor renal function, cultures, allergic reaction  Height: 5\' 7"  (170.2 cm) Weight: 194 lb 3.6 oz (88.1 kg) IBW/kg (Calculated) : 66.1  Temp (24hrs), Avg:98.9 F (37.2 C), Min:98 F (36.7 C), Max:100 F (37.8 C)   Recent Labs Lab 02-May-2017 1017 May 02, 2017 1455 05/02/2017 1524 04/24/17 0533 04/25/17 0255  WBC 24.4*  --   --  10.0 30.3*  CREATININE 2.8* 2.58*  --  2.04* 2.54*  LATICACIDVEN  --   --  1.25  --   --     Estimated Creatinine Clearance: 25.4 mL/min (A) (by C-G formula based on SCr of 2.54 mg/dL (H)).    Allergies  Allergen Reactions  . Amoxicillin-Pot Clavulanate Rash  . Levofloxacin Other (See Comments)    Doesn't tolerate  . Oxycodone Hives  . Ace Inhibitors Rash  . Ceftriaxone Rash  . Cefuroxime Rash  . Chlorhexidine Gluconate Rash    H/o recurrent cellulitis and rash   . Clindamycin/Lincomycin Rash    Erythematous drug eruption  . Hydralazine Itching  . Meropenem Rash  . Sulfonamide Derivatives Rash    Mother was highly allergic. Patient untested.   . Vancomycin Rash  . Zosyn [Piperacillin Sod-Tazobactam So] Rash    No other systemic symptoms    Antimicrobials this admission: 5/22 vanc >>  5/22 cefepime >> 5/24 5/24 aztreonam >>   Dose adjustments this admission: n/a  Microbiology results: 5/22 BCx: ngtd 5/22 MRSA PCR: neg   Thank you for  allowing pharmacy to be a part of this patient's care.  Gwyndolyn Kaufman Bernette Redbird), PharmD  PGY1 Pharmacy Resident Pager: [phone] 04/25/2017 8:50 AM

## 2017-04-25 NOTE — Progress Notes (Addendum)
Spoke with Dr. Gala Romney. Pt's Lactic acid is now 3.0. Map is 68. HR in mid 90's to low 100's. MD verbal order for 500 cc bolus of NS. Start levophed. Check CBC and BMET at 19:00. Pt still itching, MD verbal order to give another benedryl now. Will administer and monitor closely.  Delories Heinz, RN

## 2017-04-25 NOTE — Progress Notes (Signed)
Inpatient Rehabilitation  Per PT request, patient was screened by Gavinn Collard for appropriateness for an Inpatient Acute Rehab consult.  At this time we are recommending an Inpatient Rehab consult.  Please order if you are agreeable.    Violetta Lavalle, M.A., CCC/SLP Admission Coordinator  [facility]  Cell [phone]  

## 2017-04-26 LAB — COOXEMETRY PANEL
CARBOXYHEMOGLOBIN: 2.2 % — AB (ref 0.5–1.5)
METHEMOGLOBIN: 1 % (ref 0.0–1.5)
O2 Saturation: 75.4 %
Total hemoglobin: 9 g/dL — ABNORMAL LOW (ref 12.0–16.0)

## 2017-04-26 LAB — LACTATE DEHYDROGENASE: LDH: 125 U/L (ref 98–192)

## 2017-04-26 LAB — BASIC METABOLIC PANEL
ANION GAP: 6 (ref 5–15)
BUN: 65 mg/dL — AB (ref 6–20)
CO2: 20 mmol/L — ABNORMAL LOW (ref 22–32)
Calcium: 7.2 mg/dL — ABNORMAL LOW (ref 8.9–10.3)
Chloride: 112 mmol/L — ABNORMAL HIGH (ref 101–111)
Creatinine, Ser: 1.64 mg/dL — ABNORMAL HIGH (ref 0.61–1.24)
GFR, EST AFRICAN AMERICAN: 45 mL/min — AB (ref >60)
GFR, EST NON AFRICAN AMERICAN: 38 mL/min — AB (ref >60)
Glucose, Bld: 166 mg/dL — ABNORMAL HIGH (ref 65–99)
POTASSIUM: 4.1 mmol/L (ref 3.5–5.1)
SODIUM: 138 mmol/L (ref 135–145)

## 2017-04-26 LAB — LACTIC ACID, PLASMA
LACTIC ACID, VENOUS: 1.5 mmol/L (ref 0.5–1.9)
Lactic Acid, Venous: 2.1 mmol/L (ref 0.5–1.9)

## 2017-04-26 LAB — CBC
HEMATOCRIT: 29.6 % — AB (ref 39.0–52.0)
HEMOGLOBIN: 9.3 g/dL — AB (ref 13.0–17.0)
MCH: 27.5 pg (ref 26.0–34.0)
MCHC: 30.7 g/dL (ref 30.0–36.0)
MCV: 89.4 fL (ref 78.0–100.0)
Platelets: 251 10*3/uL (ref 150–400)
RBC: 3.31 MIL/uL — AB (ref 4.22–5.81)
RDW: 19 % — ABNORMAL HIGH (ref 11.5–15.5)
WBC: 23.1 10*3/uL — AB (ref 4.0–10.5)

## 2017-04-26 LAB — EXPECTORATED SPUTUM ASSESSMENT W REFEX TO RESP CULTURE

## 2017-04-26 LAB — URINE CULTURE: Culture: NO GROWTH

## 2017-04-26 LAB — PROTIME-INR
INR: 3.89
Prothrombin Time: 39.1 seconds — ABNORMAL HIGH (ref 11.4–15.2)

## 2017-04-26 LAB — PROCALCITONIN: PROCALCITONIN: 1.13 ng/mL

## 2017-04-26 LAB — MAGNESIUM: MAGNESIUM: 1.7 mg/dL (ref 1.7–2.4)

## 2017-04-26 MED ORDER — MAGNESIUM SULFATE 2 GM/50ML IV SOLN
2.0000 g | Freq: Once | INTRAVENOUS | Status: AC
  Administered 2017-04-26: 2 g via INTRAVENOUS
  Filled 2017-04-26: qty 50

## 2017-04-26 MED ORDER — ORAL CARE MOUTH RINSE
15.0000 mL | Freq: Two times a day (BID) | OROMUCOSAL | Status: DC
  Administered 2017-04-28 – 2017-05-03 (×7): 15 mL via OROMUCOSAL

## 2017-04-26 NOTE — Progress Notes (Signed)
Advanced Heart Failure VAD Team Note  Subjective:    Admitted from [facility] 21-May-2017 with black, tarry stools and Hgb 6.6.  Hgb 9.3 s/p 2 more UPRBC. WBC 30.3 -> 23.1 today. Remains on Vanc and Aztreonam.    Remains very fatigued, but feeling somewhat better. Wife thinks his hands look puffy.   CVP 6-7  LVAD INTERROGATION:  HeartMate II LVAD:  Flow 6.2 liters/min, speed 9400, power 6, PI 4.2. 27 PI events since yesterday afternoon. Much improved from previous ( Had > 240 events prior to that)  Objective:    Vital Signs:   Temp:  [97.6 F (36.4 C)-99.7 F (37.6 C)] 98.3 F (36.8 C) (05/25 0700) Pulse Rate:  [35-110] 91 (05/25 0800) Resp:  [13-28] 18 (05/25 0800) BP: (59-163)/(42-147) 82/70 (05/25 0800) SpO2:  [68 %-100 %] 100 % (05/25 0800) Weight:  [195 lb 15.8 oz (88.9 kg)] 195 lb 15.8 oz (88.9 kg) (05/25 0500) Last BM Date: 04/25/17 Mean arterial Pressure 68   Intake/Output:   Intake/Output Summary (Last 24 hours) at 04/26/17 0828 Last data filed at 04/26/17 0826  Gross per 24 hour  Intake          3534.64 ml  Output              675 ml  Net          2859.64 ml   Physical Exam: GENERAL: Pale. Elderly and fatigued appearing. NAD  HEENT: Normal NECK: Supple, JVP 6-7 Carotids OK.  CARDIAC:  Mechanical heart sounds with LVAD hum present.  LUNGS:  Diminished.  ABDOMEN: Slight tenderness, ND, no HSM. No bruits or masses. +BS  LVAD exit site: Well-healed and incorporated. Dressing dry and intact. No erythema or drainage. Stabilization device present and accurately applied. Driveline dressing changed daily per sterile technique. EXTREMITIES:  Warm and dry. No cyanosis, clubbing, rash, or edema.  NEUROLOGIC:  Alert & oriented x 3. Cranial nerves grossly intact. Moves all 4 extremities w/o difficulty. Affect flat.   Telemetry: Personally reviewed, V paced 80s    Labs: Basic Metabolic Panel:  Recent Labs Lab 05/21/17 1455 04/24/17 0533 04/25/17 0255 04/25/17 2155  04/26/17 0545  NA 136 139 137 139 138  K 4.7 4.1 5.0 4.0 4.1  CL 104 108 107 116* 112*  CO2 25 22 24  18* 20*  GLUCOSE 137* 84 102* 161* 166*  BUN 115* 106* 102* 71* 65*  CREATININE 2.58* 2.04* 2.54* 1.77* 1.64*  CALCIUM 8.7* 8.2* 8.0* 6.9* 7.2*    Liver Function Tests:  Recent Labs Lab May 21, 2017 1017 May 21, 2017 1455  AST 25 19  ALT 27 23  ALKPHOS 83 79  BILITOT 0.60 0.6  PROT 5.3* 5.0*  ALBUMIN 2.2* 2.1*   No results for input(s): LIPASE, AMYLASE in the last 168 hours. No results for input(s): AMMONIA in the last 168 hours.  CBC:  Recent Labs Lab 05-21-2017 1017 2017-05-21 2027 04/24/17 0533 04/25/17 0255 04/25/17 2155 04/26/17 0638  WBC 24.4*  --  10.0 30.3* 23.4* 23.1*  NEUTROABS 22.8*  --   --   --   --   --   HGB 6.6* 8.4* 8.2* 8.2* 7.2* 9.3*  HCT 22.1* 27.6* 26.2* 27.2* 23.9* 29.6*  MCV 95  --  89.4 90.1 91.6 89.4  PLT 382  --  302 283 233 251    INR:  Recent Labs Lab May 21, 2017 1455 04/24/17 0533 04/25/17 0255 04/26/17 0545  INR 4.12* 3.94 3.99 3.89    Other results:   Imaging:  Dg Chest Port 1 View  Result Date: 04/25/2017 CLINICAL DATA:  Central line placement.  Initial encounter. EXAM: PORTABLE CHEST 1 VIEW COMPARISON:  Chest radiograph performed 04/26/2017 FINDINGS: A right subclavian line is noted ending about the cavoatrial junction. A small left pleural effusion is noted. Left basilar airspace opacity could reflect pneumonia. No pneumothorax is identified. The cardiomediastinal silhouette is borderline enlarged. The patient is status post median sternotomy. A left ventricular assist device is noted. A pacemaker/AICD is noted overlying the left chest wall, with leads ending overlying the right atrium, right ventricle and coronary sinus. The patient is status post median sternotomy. No acute osseous abnormalities are seen. IMPRESSION: 1. Right subclavian line noted ending about the cavoatrial junction. 2. Small left pleural effusion noted. Left basilar  airspace opacity could reflect pneumonia. Would correlate with the patient's symptoms. 3. Borderline cardiomegaly. Electronically Signed   By: Roanna Raider M.D.   On: 04/25/2017 20:39     Medications:     Scheduled Medications: . calcium carbonate  1 tablet Oral BID WC  . cholecalciferol  1,000 Units Oral Daily  . febuxostat  40 mg Oral Daily  . feeding supplement  237 mL Oral Q24H  . ferrous sulfate  325 mg Oral Q breakfast  . hydroxychloroquine  400 mg Oral QPC breakfast  . mirtazapine  7.5 mg Oral QHS  . mometasone-formoterol  2 puff Inhalation BID  . pantoprazole (PROTONIX) IV  40 mg Intravenous Q12H  . pravastatin  40 mg Oral Daily  . sodium chloride flush  10-40 mL Intracatheter Q12H  . tamsulosin  0.4 mg Oral Daily    Infusions: . sodium chloride    . aztreonam Stopped (04/26/17 0603)  . norepinephrine (LEVOPHED) Adult infusion 4 mcg/min (04/26/17 0300)  . vancomycin Stopped (04/25/17 2219)  . vasopressin (PITRESSIN) infusion - *FOR SHOCK* 0.03 Units/min (04/26/17 0045)    PRN Medications: acetaminophen, albuterol, diphenhydrAMINE, Melatonin, ondansetron (ZOFRAN) IV, sodium chloride flush   Assessment/Plan   1. Symptomatic Anemia - Hgb up to 9.3 s/p 4 UPRBCs since admit ( was 6.6 on admit).  - May need to involve GI if Hgb does not stabilize.  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 Noted to have functional SBO with 3 retained capsule cameras.  2. Chronic systolic CHF due to ICM s/p LVAD 04/2012 at [facility] for DT - Volume status stable. JVP 6-7. - MAPs improved on vasopressin and levophed.  3. Chronic anticoag - INR remains supratherapeutic at 3.89. Goal INR 1.7-2.3.   - No role for reversal at this time.  4. Chronic Afib - Stable. Rate controlled. - On coumadin, but on hold currently with GI bleed. INR has not dropped.  5. Hypotension/Low MAPs - MAPs quite soft.  BP low with shock.  6. ARF on CKD IV - Improved. Continue to follow.  7. RLE wound - WOC following.   8. Septic Shock with productive cough - suspect HCAP - CXR yesterday with L basilar airspace opacity - From unclear source. Lactic acid up to 3.0 last night. Down to 1.5 this am.  - UA negative. UCx pending.  - BCx NGTD - ContinueVanc and Aztreonam with zosyn allergy. - Continue vasopressin 0.03 - Continue Levophed (currently at 4)  Prognosis guarded. Extensive workup at [facility] has been low yield. Continue pressor support. Continue ABX. WBC trending down.   I reviewed the LVAD parameters from  today, and compared the results to the patient's prior recorded data.  No programming changes were made.  The LVAD is functioning within specified parameters.  The nurse performs LVAD self-test daily.  LVAD interrogation was negative for any significant power changes, alarms or PI events/speed drops.  LVAD equipment check completed and is in good working order.  Back-up equipment present.   LVAD education done on emergency procedures and precautions and reviewed exit site care.  Length of Stay: 3  Luane School 04/26/2017, 8:28 AM  VAD Team --- VAD ISSUES ONLY--- Pager [phone] (7am - 7am)  Advanced Heart Failure Team  Pager [phone] (M-F; 7a - 4p)  Please contact [facility] for night-coverage after hours (4p -7a ) and weekends on [email or web address]  Agree with above.   He is critically ill. Yesterday antibiotics changed for worsening leukocytosis and probable HCAP. Overnight developed sepsis with hypotension and lactic acidosis. Given IVF and started on vasopressors. Now on NE and vasopression. BP improved. Lactic acidosis improved. CVP 6-7 today. Will wean NE.  Still with thick green sputum. Will send sputum cx. BCX still NGTD.   Hgb also remains low. Rec'd 2u RBCs last night. Had brown stool this am. Has had multiple GI work-ups at [facility]. Would not repeat scope unless rebleeds. INR  slowly trending down 3.89. Discussed with PharmD.   VAD parameters stable.   On exam Weak appearing but improved not as pale + vough CVP 6-7 Cor LVAD hum Lungs rhonchorous Ab soft NT Ext warm +dressing More alert  Continue supportive care. Try to mobilize.   CRITICAL CARE Performed by: Arvilla Meres  Total critical care time: 45 minutes  Critical care time was exclusive of separately billable procedures and treating other patients.  Critical care was necessary to treat or prevent imminent or life-threatening deterioration.  Critical care was time spent personally by me (independent of midlevel providers or residents) on the following activities: development of treatment plan with patient and/or surrogate as well as nursing, discussions with consultants, evaluation of patient's response to treatment, examination of patient, obtaining history from patient or surrogate, ordering and performing treatments and interventions, ordering and review of laboratory studies, ordering and review of radiographic studies, pulse oximetry and re-evaluation of patient's condition.  Arvilla Meres, MD  11:27 AM

## 2017-04-26 NOTE — Progress Notes (Signed)
CRITICAL VALUE ALERT  Critical Value:  Lactic acid 2.1  Date & Time Notied:  04/26/17 0030  Provider Notified: Cardiology Fellow Aware   Orders Received/Actions taken: Lactic trending down, Pt receiving 2 units of blood, will continue to monitor.

## 2017-04-26 NOTE — Progress Notes (Signed)
Physical Therapy Treatment Patient Details Name: [name] MRN: [id number] DOB: [birth year] Today's Date: 04/26/2017    History of Present Illness [name] [name] a 76 y.o.malewith CAD with PCI, ICM, afib, hx of CVA in 1987, hx of DVT/PE, HTN, hyperlipidemia, CKD III, and chronic systolic HF. Pt had [name] implant at [facility] on 04/2012. Admitted from [facility] 04/22/2017 with black, tarry stools and Hgb 6.6.    PT Comments    Pt reporting generalized fatigue and weakness with willingness to get to EOB. Once sitting pt agreeable to standing and attempting gait. Pt with improved function today and decreased assist required as well as ability to perform limited gait. Pt encouraged to continue mobility with nursing staff. Small skin tear RUE during session with RN present to assess and provide dressing. No dizziness and VSS throughout. With limited gait did not transition power sources this session.   HR 98-110 sats 91-95% on 2L at rest and 4L with activity BP with activity 108/91 (99)   Follow Up Recommendations  CIR;Supervision/Assistance - 24 hour     Equipment Recommendations  None recommended by PT    Recommendations for Other Services       Precautions / Restrictions Precautions Precautions: Fall Precaution Comments: LVAD    Mobility  Bed Mobility Overal bed mobility: Needs Assistance Bed Mobility: Supine to Sit     Supine to sit: Mod assist;HOB elevated     General bed mobility comments: cues for sequence with assist to initiate pivot to EOB and assist to elevate trunk. Transferred to right due to lines, pt normally exits on left  Transfers Overall transfer level: Needs assistance   Transfers: Sit to/from Stand Sit to Stand: Mod assist;From elevated surface;+2 physical assistance Stand pivot transfers: Min assist;+2 physical assistance       General transfer comment: pt able to stand from elevated bed with mod assist of 1 person with cues for anterior  translation and assist to extend hips and trunk in standing. From lower chair required 2 person mod assist with cues for hand placement and assist for transition to full standing  Ambulation/Gait Ambulation/Gait assistance: Min assist;+2 safety/equipment Ambulation Distance (Feet): 10 Feet Assistive device: Rolling walker (2 wheeled) Gait Pattern/deviations: Step-through pattern;Decreased stride length;Trunk flexed   Gait velocity interpretation: Below normal speed for age/gender General Gait Details: cues for posture, position in RW, breathing technique and chair to follow   Stairs            Wheelchair Mobility    Modified Rankin (Stroke Patients Only)       Balance Overall balance assessment: Needs assistance   Sitting balance-Leahy Scale: Good       Standing balance-Leahy Scale: Poor                              Cognition Arousal/Alertness: Awake/alert Behavior During Therapy: Flat affect Overall Cognitive Status: Within Functional Limits for tasks assessed                                        Exercises      General Comments        Pertinent Vitals/Pain Pain Assessment: No/denies pain    Home Living                      Prior Function  PT Goals (current goals can now be found in the care plan section) Progress towards PT goals: Progressing toward goals    Frequency           PT Plan Current plan remains appropriate    Co-evaluation              AM-PAC PT "6 Clicks" Daily Activity  Outcome Measure  Difficulty turning over in bed (including adjusting bedclothes, sheets and blankets)?: A Lot Difficulty moving from lying on back to sitting on the side of the bed? : Total Difficulty sitting down on and standing up from a chair with arms (e.g., wheelchair, bedside commode, etc,.)?: Total Help needed moving to and from a bed to chair (including a wheelchair)?: A Lot Help needed walking  in hospital room?: A Lot Help needed climbing 3-5 steps with a railing? : Total 6 Click Score: 9    End of Session Equipment Utilized During Treatment: Gait belt;Oxygen Activity Tolerance: Patient tolerated treatment well Patient left: in chair;with call bell/phone within reach;with family/visitor present;with nursing/sitter in room Nurse Communication: Mobility status;Precautions       Time: 1694-5038 PT Time Calculation (min) (ACUTE ONLY): 31 min  Charges:  $Gait Training: 8-22 mins $Therapeutic Activity: 8-22 mins                    G Codes:       Delaney Meigs, PT [phone]   Enedina Finner Azreal Stthomas 04/26/2017, 12:46 PM

## 2017-04-26 NOTE — Progress Notes (Signed)
[facility]  CONSULT SERVICE NOTE  Pt with LVAD admitted with anemia and GIB. Pt demonstrating significant weakness with PT. We are asked to follow along for potential rehab admission. Will observe for functional and medical progress over weekend. Full consult to follow.   Ranelle Oyster, MD, [facility] Physical Medicine & Rehabilitation 04/26/2017

## 2017-04-27 LAB — VANCOMYCIN, TROUGH: VANCOMYCIN TR: 19 ug/mL (ref 15–20)

## 2017-04-27 LAB — BPAM RBC
BLOOD PRODUCT EXPIRATION DATE: 201806072359
Blood Product Expiration Date: 201806042359
Blood Product Expiration Date: 201806042359
Blood Product Expiration Date: 201806072359
ISSUE DATE / TIME: 201805221527
ISSUE DATE / TIME: 201805221754
ISSUE DATE / TIME: 201805250036
ISSUE DATE / TIME: 201805250257
UNIT TYPE AND RH: 6200
Unit Type and Rh: 6200
Unit Type and Rh: 6200
Unit Type and Rh: 6200

## 2017-04-27 LAB — PROCALCITONIN: Procalcitonin: 0.83 ng/mL

## 2017-04-27 LAB — CBC
HEMATOCRIT: 30.9 % — AB (ref 39.0–52.0)
HEMOGLOBIN: 9.5 g/dL — AB (ref 13.0–17.0)
MCH: 27.8 pg (ref 26.0–34.0)
MCHC: 30.7 g/dL (ref 30.0–36.0)
MCV: 90.4 fL (ref 78.0–100.0)
Platelets: 219 10*3/uL (ref 150–400)
RBC: 3.42 MIL/uL — ABNORMAL LOW (ref 4.22–5.81)
RDW: 19.8 % — ABNORMAL HIGH (ref 11.5–15.5)
WBC: 13.4 10*3/uL — ABNORMAL HIGH (ref 4.0–10.5)

## 2017-04-27 LAB — TYPE AND SCREEN
ABO/RH(D): A POS
Antibody Screen: NEGATIVE
UNIT DIVISION: 0
UNIT DIVISION: 0
UNIT DIVISION: 0
Unit division: 0

## 2017-04-27 LAB — BASIC METABOLIC PANEL
ANION GAP: 6 (ref 5–15)
BUN: 55 mg/dL — ABNORMAL HIGH (ref 6–20)
CALCIUM: 8.1 mg/dL — AB (ref 8.9–10.3)
CHLORIDE: 112 mmol/L — AB (ref 101–111)
CO2: 21 mmol/L — ABNORMAL LOW (ref 22–32)
Creatinine, Ser: 1.55 mg/dL — ABNORMAL HIGH (ref 0.61–1.24)
GFR calc Af Amer: 48 mL/min — ABNORMAL LOW (ref >60)
GFR calc non Af Amer: 41 mL/min — ABNORMAL LOW (ref >60)
GLUCOSE: 116 mg/dL — AB (ref 65–99)
Potassium: 4.7 mmol/L (ref 3.5–5.1)
Sodium: 139 mmol/L (ref 135–145)

## 2017-04-27 LAB — PROTIME-INR
INR: 3.33
Prothrombin Time: 34.6 seconds — ABNORMAL HIGH (ref 11.4–15.2)

## 2017-04-27 LAB — LACTATE DEHYDROGENASE: LDH: 136 U/L (ref 98–192)

## 2017-04-27 NOTE — Progress Notes (Signed)
Patient ID: [name], male   DOB: August 12, 1941, 76 y.o.   MRN: [id number]   Advanced Heart Failure VAD Team Note  Subjective:    Admitted from [facility] 03-May-2017 with black, tarry stools and Hgb 6.6.  Hgb stable at 9.5 today.  WBC 30.3 -> 23.1 -> 13 today. PCT 1.13 => 0.83.  Remains on Vancomycin and Aztreonam.  Cultures all negative so far and afebrile.   Creatinine improved.   Feels "bad" generally, fatigued.  Not short of breath.    LVAD INTERROGATION:  HeartMate II LVAD:  Flow 5.9 liters/min, speed 9400, power 6.2, PI 4.8. 17 PI events last 24 hrs (stable).   Objective:    Vital Signs:   Temp:  [97.9 F (36.6 C)-99 F (37.2 C)] 97.9 F (36.6 C) (05/26 0836) Pulse Rate:  [25-113] 50 (05/26 0700) Resp:  [12-24] 13 (05/26 0700) BP: (51-126)/(28-91) 67/47 (05/26 0700) SpO2:  [87 %-100 %] 96 % (05/26 0700) Weight:  [197 lb 6.4 oz (89.5 kg)] 197 lb 6.4 oz (89.5 kg) (05/26 0449) Last BM Date: 04/26/17 (per report) Mean arterial Pressure 70s   Intake/Output:   Intake/Output Summary (Last 24 hours) at 04/27/17 0957 Last data filed at 04/27/17 0700  Gross per 24 hour  Intake          1480.23 ml  Output             1330 ml  Net           150.23 ml   Physical Exam: GENERAL: Pale. Elderly and fatigued appearing. NAD  HEENT: Normal NECK: Supple, JVP not elevated.  CARDIAC:  Mechanical heart sounds with LVAD hum present.  LUNGS:  Decrease breath sounds at bases bilaterally.  ABDOMEN: Slight tenderness, ND, no HSM. No bruits or masses. +BS  LVAD exit site: Well-healed and incorporated. Dressing dry and intact. No erythema or drainage. Stabilization device present and accurately applied. Driveline dressing changed daily per sterile technique. EXTREMITIES:  Warm and dry. No cyanosis, clubbing, rash.  1+ edema 1/2 to knee on left, 1+ ankle edema on right.  NEUROLOGIC:  Alert & oriented x 3. Cranial nerves grossly intact. Moves all 4 extremities w/o difficulty. Affect flat.    Telemetry: Personally reviewed, afib with rate around 100   Labs: Basic Metabolic Panel:  Recent Labs Lab 04/24/17 0533 04/25/17 0255 04/25/17 2155 04/26/17 0545 04/26/17 0852 04/27/17 0435  NA 139 137 139 138  --  139  K 4.1 5.0 4.0 4.1  --  4.7  CL 108 107 116* 112*  --  112*  CO2 22 24 18* 20*  --  21*  GLUCOSE 84 102* 161* 166*  --  116*  BUN 106* 102* 71* 65*  --  55*  CREATININE 2.04* 2.54* 1.77* 1.64*  --  1.55*  CALCIUM 8.2* 8.0* 6.9* 7.2*  --  8.1*  MG  --   --   --   --  1.7  --     Liver Function Tests:  Recent Labs Lab 05-03-17 1017 May 03, 2017 1455  AST 25 19  ALT 27 23  ALKPHOS 83 79  BILITOT 0.60 0.6  PROT 5.3* 5.0*  ALBUMIN 2.2* 2.1*   No results for input(s): LIPASE, AMYLASE in the last 168 hours. No results for input(s): AMMONIA in the last 168 hours.  CBC:  Recent Labs Lab 03-May-2017 1017  04/24/17 0533 04/25/17 0255 04/25/17 2155 04/26/17 0638 04/27/17 0435  WBC 24.4*  --  10.0 30.3* 23.4* 23.1* 13.4*  NEUTROABS 22.8*  --   --   --   --   --   --   HGB 6.6*  < > 8.2* 8.2* 7.2* 9.3* 9.5*  HCT 22.1*  < > 26.2* 27.2* 23.9* 29.6* 30.9*  MCV 95  --  89.4 90.1 91.6 89.4 90.4  PLT 382  --  302 283 233 251 219  < > = values in this interval not displayed.  INR:  Recent Labs Lab Apr 26, 2017 1455 04/24/17 0533 04/25/17 0255 04/26/17 0545 04/27/17 0435  INR 4.12* 3.94 3.99 3.89 3.33    Other results:   Imaging: Dg Chest Port 1 View  Result Date: 04/25/2017 CLINICAL DATA:  Central line placement.  Initial encounter. EXAM: PORTABLE CHEST 1 VIEW COMPARISON:  Chest radiograph performed 04-26-17 FINDINGS: A right subclavian line is noted ending about the cavoatrial junction. A small left pleural effusion is noted. Left basilar airspace opacity could reflect pneumonia. No pneumothorax is identified. The cardiomediastinal silhouette is borderline enlarged. The patient is status post median sternotomy. A left ventricular assist device is  noted. A pacemaker/AICD is noted overlying the left chest wall, with leads ending overlying the right atrium, right ventricle and coronary sinus. The patient is status post median sternotomy. No acute osseous abnormalities are seen. IMPRESSION: 1. Right subclavian line noted ending about the cavoatrial junction. 2. Small left pleural effusion noted. Left basilar airspace opacity could reflect pneumonia. Would correlate with the patient's symptoms. 3. Borderline cardiomegaly. Electronically Signed   By: Roanna Raider M.D.   On: 04/25/2017 20:39     Medications:     Scheduled Medications: . calcium carbonate  1 tablet Oral BID WC  . cholecalciferol  1,000 Units Oral Daily  . febuxostat  40 mg Oral Daily  . feeding supplement  237 mL Oral Q24H  . ferrous sulfate  325 mg Oral Q breakfast  . hydroxychloroquine  400 mg Oral QPC breakfast  . mouth rinse  15 mL Mouth Rinse BID  . mirtazapine  7.5 mg Oral QHS  . mometasone-formoterol  2 puff Inhalation BID  . pantoprazole (PROTONIX) IV  40 mg Intravenous Q12H  . pravastatin  40 mg Oral Daily  . sodium chloride flush  10-40 mL Intracatheter Q12H  . tamsulosin  0.4 mg Oral Daily    Infusions: . sodium chloride    . aztreonam Stopped (04/27/17 1610)  . norepinephrine (LEVOPHED) Adult infusion Stopped (04/26/17 1855)  . vancomycin Stopped (04/26/17 2215)  . vasopressin (PITRESSIN) infusion - *FOR SHOCK* 0.03 Units/min (04/26/17 2030)    PRN Medications: acetaminophen, albuterol, diphenhydrAMINE, Melatonin, ondansetron (ZOFRAN) IV, sodium chloride flush   Assessment/Plan   1. 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 Noted to have functional SBO with 3 retained capsule cameras.  Has had multiple GI work-ups at [facility]. Would not repeat scope unless rebleeds. INR slowly trending down 3.33.  Has had multiple transfusions this  admission. Hemoglobin now stable.  2. Chronic systolic CHF due to ICM s/p LVAD 04/2012 at [facility] for DT.  Volume status stable by exam.  3. Chronic anticoagulation for LVAD/afib: Goal INR 1.7-2.3, no ASA.  INR slowly coming down, 3.33 today.  Warfarin held.    - No role for reversal at this time.  4. Chronic Afib: Warfarin on hold. HR mildly elevated in 100s.  5. Shock: Septic + hypovolemic. MAP 70s on vasopressin 0.03.  Now off norepinephrine.  No longer hypovolemic.  - If cannot stop vasopressin today, would replace vasopressin with norepinephrine instead for slow wean.  6. AKI on CKD IV: Creatinine continues to fall.  7. RLE wound: WOC following.   8. Septic Shock with productive cough: suspect HCAP.  CXR with L basilar airspace opacity.  Suspect improving.  Afebrile, PCT falling, WBCs falling.  BP improving.  Cultures negative so far.  - Continue Vanc and Aztreonam with zosyn allergy. - Wean pressors as able.   Prognosis guarded. Extensive workup at [facility] has been low yield. Continue pressor support, wean as able. Continue ABX. WBC trending down.   I reviewed the LVAD parameters from today, and compared the results to the patient's prior recorded data.  No programming changes were made.  The LVAD is functioning within specified parameters.  The nurse performs LVAD self-test daily.  LVAD interrogation was negative for any significant power changes, alarms or PI events/speed drops.  LVAD equipment check completed and is in good working order.  Back-up equipment present.   LVAD education done on emergency procedures and precautions and reviewed exit site care.  Length of Stay: 4  Marca Ancona, MD 04/27/2017, 9:57 AM  VAD Team --- VAD ISSUES ONLY--- Pager [phone] (7am - 7am)  Advanced Heart Failure Team  Pager [phone] (M-F; 7a - 4p)  Please contact [facility] for night-coverage after hours (4p -7a ) and weekends on [email or web address]

## 2017-04-27 NOTE — Progress Notes (Signed)
Pharmacy Antibiotic Note  [name] is a 76 y.o. male admitted on 04/28/2017 with sepsis.  Pharmacy has been consulted for vancomycin and aztreonam dosing. Of note, he has a long list of antibiotic allergies (rash) and has been continually itching this admission on vanc and cefepime. Pt is afebrile and WBC is elevated but trending down. A vancomycin trough today was therapeutic but was drawn early.   Plan: Continue vanc 1gm IV Q24H F/u renal fxn, C&S, clinical status and trough as needed  Height: 5\' 7"  (170.2 cm) Weight: 197 lb 6.4 oz (89.5 kg) IBW/kg (Calculated) : 66.1  Temp (24hrs), Avg:98.2 F (36.8 C), Min:97.6 F (36.4 C), Max:99 F (37.2 C)   Recent Labs Lab 04/09/2017 1524 04/24/17 0533 04/25/17 0255 04/25/17 0904 04/25/17 1256 04/25/17 2155 04/25/17 2249 04/26/17 0545 04/26/17 0638 04/27/17 0435 04/27/17 1730  WBC  --  10.0 30.3*  --   --  23.4*  --   --  23.1* 13.4*  --   CREATININE  --  2.04* 2.54*  --   --  1.77*  --  1.64*  --  1.55*  --   LATICACIDVEN 1.25  --   --  1.3 3.0*  --  2.1* 1.5  --   --   --   VANCOTROUGH  --   --   --   --   --   --   --   --   --   --  19    Estimated Creatinine Clearance: 41.9 mL/min (A) (by C-G formula based on SCr of 1.55 mg/dL (H)).    Allergies  Allergen Reactions  . Amoxicillin-Pot Clavulanate Rash  . Levofloxacin Other (See Comments)    Doesn't tolerate  . Oxycodone Hives  . Ace Inhibitors Rash  . Ceftriaxone Rash  . Cefuroxime Rash  . Chlorhexidine Gluconate Rash    H/o recurrent cellulitis and rash   . Clindamycin/Lincomycin Rash    Erythematous drug eruption  . Hydralazine Itching  . Meropenem Rash  . Sulfonamide Derivatives Rash    Mother was highly allergic. Patient untested.   . Vancomycin Rash  . Zosyn [Piperacillin Sod-Tazobactam So] Rash    No other systemic symptoms    Antimicrobials this admission: 5/22 vanc>> 5/22 cefepime>>5/24 5/24 aztreonam >>   Dose adjustments this  admission: n/a  Microbiology results: 5/22BCx: ng3d 5/22 MRSA PCR: neg 5/24 UCx: no growth  Thank you for allowing pharmacy to be a part of this patient's care.  Lysle Pearl, PharmD, BCPS Pager # [phone] 04/27/2017 6:43 PM

## 2017-04-28 LAB — BASIC METABOLIC PANEL
Anion gap: 4 — ABNORMAL LOW (ref 5–15)
BUN: 52 mg/dL — AB (ref 6–20)
CHLORIDE: 112 mmol/L — AB (ref 101–111)
CO2: 23 mmol/L (ref 22–32)
Calcium: 8 mg/dL — ABNORMAL LOW (ref 8.9–10.3)
Creatinine, Ser: 1.56 mg/dL — ABNORMAL HIGH (ref 0.61–1.24)
GFR calc Af Amer: 47 mL/min — ABNORMAL LOW (ref >60)
GFR, EST NON AFRICAN AMERICAN: 41 mL/min — AB (ref >60)
GLUCOSE: 80 mg/dL (ref 65–99)
POTASSIUM: 4.8 mmol/L (ref 3.5–5.1)
Sodium: 139 mmol/L (ref 135–145)

## 2017-04-28 LAB — CBC
HEMATOCRIT: 31 % — AB (ref 39.0–52.0)
Hemoglobin: 9.2 g/dL — ABNORMAL LOW (ref 13.0–17.0)
MCH: 27.3 pg (ref 26.0–34.0)
MCHC: 29.7 g/dL — AB (ref 30.0–36.0)
MCV: 92 fL (ref 78.0–100.0)
Platelets: 237 10*3/uL (ref 150–400)
RBC: 3.37 MIL/uL — ABNORMAL LOW (ref 4.22–5.81)
RDW: 19.7 % — ABNORMAL HIGH (ref 11.5–15.5)
WBC: 11.2 10*3/uL — ABNORMAL HIGH (ref 4.0–10.5)

## 2017-04-28 LAB — CULTURE, BLOOD (ROUTINE X 2)
CULTURE: NO GROWTH
Culture: NO GROWTH
Special Requests: ADEQUATE
Special Requests: ADEQUATE

## 2017-04-28 LAB — PROTIME-INR
INR: 2.57
Prothrombin Time: 28.1 seconds — ABNORMAL HIGH (ref 11.4–15.2)

## 2017-04-28 LAB — CULTURE, RESPIRATORY

## 2017-04-28 LAB — CULTURE, RESPIRATORY W GRAM STAIN

## 2017-04-28 LAB — LACTATE DEHYDROGENASE: LDH: 143 U/L (ref 98–192)

## 2017-04-28 MED ORDER — GERHARDT'S BUTT CREAM
TOPICAL_CREAM | Freq: Two times a day (BID) | CUTANEOUS | Status: DC
  Administered 2017-04-28: 10:00:00 via TOPICAL
  Filled 2017-04-28: qty 1

## 2017-04-28 MED ORDER — GERHARDT'S BUTT CREAM
TOPICAL_CREAM | Freq: Three times a day (TID) | CUTANEOUS | Status: DC
  Administered 2017-04-28 (×2): via TOPICAL
  Administered 2017-04-29: 1 via TOPICAL
  Administered 2017-04-29 (×2): via TOPICAL
  Filled 2017-04-28 (×2): qty 1

## 2017-04-28 NOTE — Progress Notes (Signed)
Patient ID: [name], male   DOB: [birth year], 76 y.o.   MRN: [id number]   Advanced Heart Failure VAD Team Note  Subjective:    Admitted from [facility] 04/11/2017 with black, tarry stools and Hgb 6.6.  Hgb stable at 9.2 today.  WBC 30.3 -> 23.1 -> 13 -> 11.2 today. PCT 1.13 => 0.83.  Remains on Vancomycin and Aztreonam.  Cultures all negative so far and afebrile.   Creatinine stable.   Now off pressors, MAP in 80s with doppler.    Still feels subjectively "bad," says he had an "awful night" though nurse says he slept the whole night through.  Denies dyspnea. Has not moved much yet.     LVAD INTERROGATION:  HeartMate II LVAD:  Flow 5.4 liters/min, speed 9400, power 5.9, PI 4.9. Many PI events last 24 hrs (more than yesterday but comparable to the past).   Objective:    Vital Signs:   Temp:  [97.6 F (36.4 C)-98.6 F (37 C)] 98.3 F (36.8 C) (05/27 0300) Pulse Rate:  [30-133] 33 (05/27 0700) Resp:  [13-29] 13 (05/27 0700) BP: (52-95)/(44-78) 64/54 (05/27 0700) SpO2:  [89 %-100 %] 97 % (05/27 0700) Weight:  [195 lb 15.8 oz (88.9 kg)] 195 lb 15.8 oz (88.9 kg) (05/27 0500) Last BM Date: 04/28/17 Mean arterial Pressure 80s   Intake/Output:   Intake/Output Summary (Last 24 hours) at 04/28/17 0826 Last data filed at 04/28/17 0700  Gross per 24 hour  Intake          1325.85 ml  Output              650 ml  Net           675.85 ml   Physical Exam: GENERAL: NAD, drowsy  HEENT: Normal NECK: Supple, JVP not elevated.  CARDIAC:  Mechanical heart sounds with LVAD hum present.  LUNGS:  Decrease breath sounds at bases bilaterally.  ABDOMEN: nontender, ND, no HSM. No bruits or masses. +BS  LVAD exit site: Well-healed and incorporated. Dressing dry and intact. No erythema or drainage. Stabilization device present and accurately applied. Driveline dressing changed daily per sterile technique. EXTREMITIES:  Warm and dry. No cyanosis, clubbing, rash.  1+ ankle edema bilaterally.    NEUROLOGIC:  Alert & oriented x 3. Cranial nerves grossly intact. Moves all 4 extremities w/o difficulty. Affect flat.   Telemetry: Personally reviewed, afib with rate around 100   Labs: Basic Metabolic Panel:  Recent Labs Lab 04/25/17 0255 04/25/17 2155 04/26/17 0545 04/26/17 0852 04/27/17 0435 04/28/17 0430  NA 137 139 138  --  139 139  K 5.0 4.0 4.1  --  4.7 4.8  CL 107 116* 112*  --  112* 112*  CO2 24 18* 20*  --  21* 23  GLUCOSE 102* 161* 166*  --  116* 80  BUN 102* 71* 65*  --  55* 52*  CREATININE 2.54* 1.77* 1.64*  --  1.55* 1.56*  CALCIUM 8.0* 6.9* 7.2*  --  8.1* 8.0*  MG  --   --   --  1.7  --   --     Liver Function Tests:  Recent Labs Lab 04/08/2017 1017 04/18/2017 1455  AST 25 19  ALT 27 23  ALKPHOS 83 79  BILITOT 0.60 0.6  PROT 5.3* 5.0*  ALBUMIN 2.2* 2.1*   No results for input(s): LIPASE, AMYLASE in the last 168 hours. No results for input(s): AMMONIA in the last 168 hours.  CBC:  Recent  Labs Lab 04/06/2017 1017  04/25/17 0255 04/25/17 2155 04/26/17 0638 04/27/17 0435 04/28/17 0430  WBC 24.4*  < > 30.3* 23.4* 23.1* 13.4* 11.2*  NEUTROABS 22.8*  --   --   --   --   --   --   HGB 6.6*  < > 8.2* 7.2* 9.3* 9.5* 9.2*  HCT 22.1*  < > 27.2* 23.9* 29.6* 30.9* 31.0*  MCV 95  < > 90.1 91.6 89.4 90.4 92.0  PLT 382  < > 283 233 251 219 237  < > = values in this interval not displayed.  INR:  Recent Labs Lab 04/24/17 0533 04/25/17 0255 04/26/17 0545 04/27/17 0435 04/28/17 0430  INR 3.94 3.99 3.89 3.33 2.57    Other results:   Imaging: No results found.   Medications:     Scheduled Medications: . calcium carbonate  1 tablet Oral BID WC  . cholecalciferol  1,000 Units Oral Daily  . febuxostat  40 mg Oral Daily  . feeding supplement  237 mL Oral Q24H  . ferrous sulfate  325 mg Oral Q breakfast  . Gerhardt's butt cream   Topical BID  . hydroxychloroquine  400 mg Oral QPC breakfast  . mouth rinse  15 mL Mouth Rinse BID  . mirtazapine   7.5 mg Oral QHS  . mometasone-formoterol  2 puff Inhalation BID  . pantoprazole (PROTONIX) IV  40 mg Intravenous Q12H  . pravastatin  40 mg Oral Daily  . sodium chloride flush  10-40 mL Intracatheter Q12H  . tamsulosin  0.4 mg Oral Daily    Infusions: . sodium chloride    . aztreonam 1 g (04/28/17 0603)  . norepinephrine (LEVOPHED) Adult infusion Stopped (04/26/17 1855)  . vancomycin Stopped (04/27/17 2210)  . vasopressin (PITRESSIN) infusion - *FOR SHOCK* Stopped (04/27/17 1230)    PRN Medications: acetaminophen, albuterol, diphenhydrAMINE, Melatonin, ondansetron (ZOFRAN) IV, sodium chloride flush   Assessment/Plan   1. 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 Noted to have functional SBO with 3 retained capsule cameras.  Has had multiple GI work-ups at [facility]. Would not repeat scope unless rebleeds. INR slowly trending down 2.57.  Has had multiple transfusions this admission. Hemoglobin now stable with no further overt bleeding.  2. Chronic systolic CHF due to ICM s/p LVAD 04/2012 at [facility] for DT.  Volume status stable by exam.  3. Chronic anticoagulation for LVAD/afib: Goal INR 1.7-2.3, no ASA.  INR slowly coming down, 2.57 today.  Continue to hold warfarin, probably restart carefully tomorrow.    4. Chronic Afib: Warfarin on hold. HR mildly elevated in 100s.  5. Shock: Septic + hypovolemic. Now resolved, off pressors, MAP 80s.  6. AKI on CKD IV: Creatinine stable.  7. RLE wound: WOC following.   8. Septic Shock with productive cough: suspect HCAP.  CXR with L basilar airspace opacity.  Improving.  Afebrile, PCT falling, WBCs falling.  Off pressors.  Cultures negative so far.  - Continue Vanc and Aztreonam with zosyn allergy.  Try to mobilize some today.  Eventually will need CIR.  If continues to do well today, can go to step down tomorrow.   I reviewed the LVAD  parameters from today, and compared the results to the patient's prior recorded data.  No programming changes were made.  The LVAD is functioning within specified parameters.  The nurse performs LVAD self-test daily.  LVAD interrogation was negative for any significant power changes, alarms or PI events/speed drops.  LVAD equipment check completed and is in good working order.  Back-up equipment present.   LVAD education done on emergency procedures and precautions and reviewed exit site care.  Length of Stay: 5  Marca Ancona, MD 04/28/2017, 8:26 AM  VAD Team --- VAD ISSUES ONLY--- Pager [phone] (7am - 7am)  Advanced Heart Failure Team  Pager [phone] (M-F; 7a - 4p)  Please contact [facility] for night-coverage after hours (4p -7a ) and weekends on [email or web address]

## 2017-04-28 NOTE — Consult Note (Signed)
WOC Nurse wound consult note Reason for Consult:Patient with recently healed full thickness wound on posterior RLE, treated and followed by Dr. Tanda Rockers at the [facility] outpatient wound care center. 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 They are using InterDry Ag+ for this at home and we will continue its use here. Several (4) intact, fluid filled blisters on the thoracic area are noted.  No pressure injuries. As he is getting up today, we will provide a pressure redistribution chair pad for him comfort when OOB in the chair. Wound type: Moisture associated skin damage, specifically, ITD. Pressure Injury POA: No Measurement:  1. Four intact, serum filled blisters on the mid-back (thoracic) area, the largest of which measures 2.5cm x 3cm.  2. Bilateral inguinal with diffuse area of peeling, weeping skin. Anterior scrotum is with ruptured blister and exposed dermis measuring 3cm x 4cm x 0.1cm.Scant serous exudate. 3. Perianal area with peeling and weeping dermatitis, small amount of serosanguinous exudate at the time of my assessment. Dr. Dirk Dress Cream (1:1:1 Zinc oxide, 1% hydrocortisone, lotrimin) is being used in that area and I agree with that POC. 4. Posterior Right LE is with recently healed full thickness skin lesion. Area is dry and with evidence of previous wound healing, scar. We will continue the POC that is in place from Dr. Altamese Cabal at the outpatient [facility] in [place] and use a silicone foam dressing with a stockinet for minor compression, inspected daily and changed twice weekly. Wound bed:As described above Drainage (amount, consistency, odor) As described above. Periwound:As described above. Dressing procedure/placement/frequency: Patient is on a therapeutic mattress with low air loss feature, will be getting OOB today to the chair and will use a pressure redistribution chair pad at  that time.Patient and wife express appreciation for visit and care provided today in conjunction with his bedside RN and continue to note how fragile his skin is. WOC nursing team will not follow, but will remain available to this patient, the nursing and medical teams.  Please re-consult if needed. Thanks, Ladona Mow, MSN, RN, GNP, [place]  Pager# [phone]

## 2017-04-29 ENCOUNTER — Encounter (HOSPITAL_COMMUNITY): Payer: Self-pay | Admitting: Physical Medicine and Rehabilitation

## 2017-04-29 DIAGNOSIS — J189 Pneumonia, unspecified organism: Secondary | ICD-10-CM

## 2017-04-29 DIAGNOSIS — N184 Chronic kidney disease, stage 4 (severe): Secondary | ICD-10-CM

## 2017-04-29 DIAGNOSIS — R000 Tachycardia, unspecified: Secondary | ICD-10-CM | POA: Insufficient documentation

## 2017-04-29 DIAGNOSIS — R0682 Tachypnea, not elsewhere classified: Secondary | ICD-10-CM

## 2017-04-29 LAB — BASIC METABOLIC PANEL
Anion gap: 4 — ABNORMAL LOW (ref 5–15)
BUN: 52 mg/dL — ABNORMAL HIGH (ref 6–20)
CALCIUM: 7.9 mg/dL — AB (ref 8.9–10.3)
CO2: 23 mmol/L (ref 22–32)
CREATININE: 1.55 mg/dL — AB (ref 0.61–1.24)
Chloride: 111 mmol/L (ref 101–111)
GFR calc non Af Amer: 41 mL/min — ABNORMAL LOW (ref >60)
GFR, EST AFRICAN AMERICAN: 48 mL/min — AB (ref >60)
Glucose, Bld: 103 mg/dL — ABNORMAL HIGH (ref 65–99)
Potassium: 4.8 mmol/L (ref 3.5–5.1)
Sodium: 138 mmol/L (ref 135–145)

## 2017-04-29 LAB — CBC
HCT: 30.5 % — ABNORMAL LOW (ref 39.0–52.0)
Hemoglobin: 9.1 g/dL — ABNORMAL LOW (ref 13.0–17.0)
MCH: 27.5 pg (ref 26.0–34.0)
MCHC: 29.8 g/dL — AB (ref 30.0–36.0)
MCV: 92.1 fL (ref 78.0–100.0)
Platelets: 266 10*3/uL (ref 150–400)
RBC: 3.31 MIL/uL — AB (ref 4.22–5.81)
RDW: 19.6 % — ABNORMAL HIGH (ref 11.5–15.5)
WBC: 10.9 10*3/uL — ABNORMAL HIGH (ref 4.0–10.5)

## 2017-04-29 LAB — PROTIME-INR
INR: 2.24
PROTHROMBIN TIME: 25.1 s — AB (ref 11.4–15.2)

## 2017-04-29 LAB — LACTATE DEHYDROGENASE: LDH: 130 U/L (ref 98–192)

## 2017-04-29 MED ORDER — CITALOPRAM HYDROBROMIDE 20 MG PO TABS
10.0000 mg | ORAL_TABLET | Freq: Every day | ORAL | Status: DC
  Administered 2017-04-29 – 2017-05-08 (×10): 10 mg via ORAL
  Filled 2017-04-29 (×11): qty 1

## 2017-04-29 MED ORDER — FENTANYL CITRATE (PF) 100 MCG/2ML IJ SOLN
12.5000 ug | Freq: Four times a day (QID) | INTRAMUSCULAR | Status: DC | PRN
  Administered 2017-04-29 – 2017-04-30 (×3): 12.5 ug via INTRAVENOUS
  Filled 2017-04-29 (×3): qty 2

## 2017-04-29 MED ORDER — WARFARIN SODIUM 2 MG PO TABS
1.0000 mg | ORAL_TABLET | Freq: Once | ORAL | Status: AC
  Administered 2017-04-29: 1 mg via ORAL
  Filled 2017-04-29: qty 0.5

## 2017-04-29 MED ORDER — HYDROXYZINE HCL 25 MG PO TABS
25.0000 mg | ORAL_TABLET | Freq: Four times a day (QID) | ORAL | Status: DC | PRN
  Administered 2017-04-29 – 2017-05-04 (×7): 25 mg via ORAL
  Filled 2017-04-29 (×7): qty 1

## 2017-04-29 MED ORDER — WARFARIN - PHARMACIST DOSING INPATIENT
Freq: Every day | Status: DC
  Administered 2017-04-30 – 2017-05-10 (×3)

## 2017-04-29 MED ORDER — ZINC OXIDE 12.8 % EX OINT
TOPICAL_OINTMENT | CUTANEOUS | Status: DC | PRN
  Filled 2017-04-29: qty 56.7

## 2017-04-29 NOTE — Progress Notes (Signed)
Paged by nurse re: wife wanting to speak with someone about patient's skin. He has been followed closely by WOC this admission. He's has had issues with skin fragility particularly in areas subject to pressure with minor skin tear on buttucks. 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 There was minor bleeding at buttock wound, no significant hemorrhaging or major oozing. Reviewed patient's skin issues with Dr. Gala Romney. Would continue present regimen for now but reinforce buttock wound with occlusive cream and dressing to keep barrier from stool. He will review meds in AM and make decision regarding cessation of vancomycin. Nurse kept informed of his recommended. Dayna Dunn PA-C

## 2017-04-29 NOTE — Progress Notes (Signed)
Patient ID: [name], male   DOB: [birth year], 76 y.o.   MRN: [id number]   Advanced Heart Failure VAD Team Note  Subjective:    Admitted from [facility] 04/14/2017 with black, tarry stools and Hgb 6.6.  Hgb stable at 9.1 today.  WBC 30.3 -> 23.1 -> 13 -> 11.2 -> 10.9  today. PCT 1.13 => 0.83.  Remains on Vancomycin and Aztreonam.  Cultures remain negative. Cough improved. Afebrile. Still itching and attributes it to vancomycin but no rash. No further melena.   Has multiple complaints in addition to his itching. Says he feels bad all over. His butt hurts and he has sores on it. He feels weak. Yesterday he said he was up all night but RNs said he slept almost the whole night.   Creatinine stable. CVP 4-5.   Remains off pressors, MAP in 70-80s with doppler.      LVAD INTERROGATION:  HeartMate II LVAD:  Flow 5.4 liters/min, speed 9400, power 6.0, PI 5.5. Still with frequent PI events  Objective:    Vital Signs:   Temp:  [97.3 F (36.3 C)-99.3 F (37.4 C)] 98.2 F (36.8 C) (05/28 0700) Pulse Rate:  [95-118] 118 (05/28 0400) Resp:  [17-23] 17 (05/28 0400) BP: (73)/(63) 73/63 (05/27 1300) SpO2:  [98 %-100 %] 100 % (05/28 0842) Weight:  [89.8 kg (197 lb 15.6 oz)] 89.8 kg (197 lb 15.6 oz) (05/28 0500) Last BM Date: 04/28/17 Mean arterial Pressure 70-80s   Intake/Output:   Intake/Output Summary (Last 24 hours) at 04/29/17 0932 Last data filed at 04/29/17 0629  Gross per 24 hour  Intake              815 ml  Output              600 ml  Net              215 ml   Physical Exam: GENERAL: NAD lying in bed  HEENT: Normal anicteric NECK: Supple, No JVD.  CARDIAC:  Mechanical heart sounds with LVAD hum present. Normal HM2 sounds LUNGS:  Clear with decreased breath sounds at bases bilaterally. No wheeze  ABDOMEN: NT/ND no HSM. No bruits or masses. Good BS  LVAD exit site: Well-healed and incorporated. Dressing dry and intact. No erythema or drainage. Stabilization device present and  accurately applied. Driveline dressing changed daily per sterile technique. No sings of infection EXTREMITIES:  Warm and dry. No cyanosis, clubbing, rash. No edema. RLE wrapped/ blisters on butt Neuro: alert & oriented x 3, cranial nerves grossly intact. moves all 4 extremities w/o difficulty. Affect flat   Telemetry:  AF ~100 multiple PVCs and occasional NSVT  Personally reviewed   Labs: Basic Metabolic Panel:  Recent Labs Lab 04/25/17 2155 04/26/17 0545 04/26/17 0852 04/27/17 0435 04/28/17 0430 04/29/17 0400  NA 139 138  --  139 139 138  K 4.0 4.1  --  4.7 4.8 4.8  CL 116* 112*  --  112* 112* 111  CO2 18* 20*  --  21* 23 23  GLUCOSE 161* 166*  --  116* 80 103*  BUN 71* 65*  --  55* 52* 52*  CREATININE 1.77* 1.64*  --  1.55* 1.56* 1.55*  CALCIUM 6.9* 7.2*  --  8.1* 8.0* 7.9*  MG  --   --  1.7  --   --   --     Liver Function Tests:  Recent Labs Lab 05/01/2017 1017 04/04/2017 1455  AST 25 19  ALT  27 23  ALKPHOS 83 79  BILITOT 0.60 0.6  PROT 5.3* 5.0*  ALBUMIN 2.2* 2.1*   No results for input(s): LIPASE, AMYLASE in the last 168 hours. No results for input(s): AMMONIA in the last 168 hours.  CBC:  Recent Labs Lab 04/30/2017 1017  04/25/17 2155 04/26/17 0638 04/27/17 0435 04/28/17 0430 04/29/17 0400  WBC 24.4*  < > 23.4* 23.1* 13.4* 11.2* 10.9*  NEUTROABS 22.8*  --   --   --   --   --   --   HGB 6.6*  < > 7.2* 9.3* 9.5* 9.2* 9.1*  HCT 22.1*  < > 23.9* 29.6* 30.9* 31.0* 30.5*  MCV 95  < > 91.6 89.4 90.4 92.0 92.1  PLT 382  < > 233 251 219 237 266  < > = values in this interval not displayed.  INR:  Recent Labs Lab 04/25/17 0255 04/26/17 0545 04/27/17 0435 04/28/17 0430 04/29/17 0400  INR 3.99 3.89 3.33 2.57 2.24    Other results:   Imaging: No results found.   Medications:     Scheduled Medications: . calcium carbonate  1 tablet Oral BID WC  . cholecalciferol  1,000 Units Oral Daily  . febuxostat  40 mg Oral Daily  . feeding supplement   237 mL Oral Q24H  . ferrous sulfate  325 mg Oral Q breakfast  . Gerhardt's butt cream   Topical TID  . hydroxychloroquine  400 mg Oral QPC breakfast  . mouth rinse  15 mL Mouth Rinse BID  . mirtazapine  7.5 mg Oral QHS  . mometasone-formoterol  2 puff Inhalation BID  . pantoprazole (PROTONIX) IV  40 mg Intravenous Q12H  . pravastatin  40 mg Oral Daily  . sodium chloride flush  10-40 mL Intracatheter Q12H  . tamsulosin  0.4 mg Oral Daily  . warfarin  1 mg Oral ONCE-1800  . Warfarin - Pharmacist Dosing Inpatient   Does not apply q1800    Infusions: . sodium chloride    . aztreonam Stopped (04/29/17 0629)  . norepinephrine (LEVOPHED) Adult infusion Stopped (04/26/17 1855)  . vancomycin Stopped (04/28/17 2356)  . vasopressin (PITRESSIN) infusion - *FOR SHOCK* Stopped (04/27/17 1230)    PRN Medications: acetaminophen, albuterol, diphenhydrAMINE, hydrOXYzine, Melatonin, ondansetron (ZOFRAN) IV, sodium chloride flush   Assessment/Plan   1. 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 Noted to have functional SBO with 3 retained capsule cameras.  Has had multiple GI work-ups at [facility]. Would not repeat scope unless rebleeds. INR slowly trending down it is 2.24 today -- Has had multiple transfusions this admission. Hemoglobin now stable with no further overt bleeding.  -- Will restart coumadin today. D/w PharmD 2. 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   -- Volume status stable by exam. CVP 4. No diuretics today 3. Chronic anticoagulation for LVAD/afib:  --Goal INR 1.7-2.3, no ASA.  INR slowly coming down, 2.24 today.  Restart today 4. Chronic Afib:  -- Resume warfarin. HR ok  5. Shock Septic with productive cough: suspect HCAP.  CXR with L basilar airspace opacity.  Improving.  Afebrile, PCT falling, WBCs falling.  Off  pressors.   -- MAP 70-80s. -- Continue Vanc and Aztreonam with zosyn allergy. Started 5/22. Will treat for 10 days. Continue atarax for itching 6. AKI on CKD  IV:  -- Creatinine stable.  7. RLE and buttocks wounds:  --WOC has seen. Appreciate recs. Discussed with nurses. Local care being provided 8. Depression -- He has multiple somatic complaints and appears to have little hope. Will start Celexa. 9. Dispo -- Try to mobilize today. Hopefully wil qualify for CIR soon. If not will need SNF.   I reviewed the LVAD parameters from today, and compared the results to the patient's prior recorded data.  No programming changes were made.  The LVAD is functioning within specified parameters.  The nurse performs LVAD self-test daily.  LVAD interrogation was negative for any significant power changes, alarms or PI events/speed drops.  LVAD equipment check completed and is in good working order.  Back-up equipment present.   LVAD education done on emergency procedures and precautions and reviewed exit site care.  Length of Stay: 6  Arvilla Meres, MD 04/29/2017, 9:32 AM  VAD Team --- VAD ISSUES ONLY--- Pager [phone] (7am - 7am)  Advanced Heart Failure Team  Pager [phone] (M-F; 7a - 4p)  Please contact [facility] for night-coverage after hours (4p -7a ) and weekends on [email or web address]

## 2017-04-29 NOTE — Progress Notes (Signed)
ANTICOAGULATION CONSULT NOTE   Pharmacy Consult for warfarin Indication: LVAD  Allergies  Allergen Reactions  . Amoxicillin-Pot Clavulanate Rash  . Levofloxacin Other (See Comments)    Doesn't tolerate  . Oxycodone Hives  . Ace Inhibitors Rash  . Ceftriaxone Rash  . Cefuroxime Rash  . Chlorhexidine Gluconate Rash    H/o recurrent cellulitis and rash   . Clindamycin/Lincomycin Rash    Erythematous drug eruption  . Hydralazine Itching  . Meropenem Rash  . Sulfonamide Derivatives Rash    Mother was highly allergic. Patient untested.   . Vancomycin Rash  . Zosyn [Piperacillin Sod-Tazobactam So] Rash    No other systemic symptoms    Patient Measurements: Height: 5\' 7"  (170.2 cm) Weight: 197 lb 15.6 oz (89.8 kg) IBW/kg (Calculated) : 66.1 Heparin Dosing Weight: n/a  Vital Signs: Temp: 98.2 F (36.8 C) (05/28 0700) Temp Source: Oral (05/28 0700) Pulse Rate: 118 (05/28 0400)  Labs:  Recent Labs  04/27/17 0435 04/28/17 0430 04/29/17 0400  HGB 9.5* 9.2* 9.1*  HCT 30.9* 31.0* 30.5*  PLT 219 237 266  LABPROT 34.6* 28.1* 25.1*  INR 3.33 2.57 2.24  CREATININE 1.55* 1.56* 1.55*    Estimated Creatinine Clearance: 42 mL/min (A) (by C-G formula based on SCr of 1.55 mg/dL (H)).   Medical History: Past Medical History:  Diagnosis Date  . Allergy   . Anemia   . Anemia of chronic renal failure, stage 4 (severe) (HCC) 02/11/2017  . Angina   . Anxiety   . Blood transfusion   . CAD (coronary artery disease)   . Cataract   . CHF (congestive heart failure) (HCC)   . Clotting disorder (HCC)   . COPD (chronic obstructive pulmonary disease) (HCC)   . Diabetes mellitus   . DVT of lower extremity (deep venous thrombosis) (HCC)    left  . Gout   . H/O hiatal hernia   . Heart murmur   . High cholesterol   . Hypertension   . Hypotension    ORTHOSTATIC  . Iron deficiency anemia due to chronic blood loss 02/11/2017  . Jaundice    "@ birth & when I was in 7th grade"  .  Malabsorption of iron 02/11/2017  . Mitral valve disorder   . Myocardial infarction (HCC) 1988  . Neuromuscular disorder (HCC)   . Osteoporosis   . Pneumonia 1993; 2009   "both serious cases"  . Pneumonia 2010   "not hospitalized"  . Pulmonary embolism on left (HCC) 1988  . 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 Shortness of breath on exertion   . Sleep apnea   . Stomach ulcer 1987-1988  . Stroke [facility]) 1987   "brain stem; skin sensitive since then; once in awhile slur"12/13/11)    Medications:  Scheduled:  . calcium carbonate  1 tablet Oral BID WC  . cholecalciferol  1,000 Units Oral Daily  . febuxostat  40 mg Oral Daily  . feeding supplement  237 mL Oral Q24H  . ferrous sulfate  325 mg Oral Q breakfast  . Gerhardt's butt cream   Topical TID  . hydroxychloroquine  400 mg Oral QPC breakfast  . mouth rinse  15 mL Mouth Rinse BID  . mirtazapine  7.5 mg Oral QHS  . mometasone-formoterol  2 puff Inhalation BID  . pantoprazole (PROTONIX) IV  40 mg Intravenous Q12H  . pravastatin  40 mg Oral Daily  . sodium chloride flush  10-40 mL Intracatheter Q12H  .  tamsulosin  0.4 mg Oral Daily  . warfarin  1 mg Oral ONCE-1800  . Warfarin - Pharmacist Dosing Inpatient   Does not apply q1800    Assessment: 76 yo male with LVAD on chronic Coumadin.  Admitted with supratherapeutic INR and GIB.  Hgb currently stable, and INR down to 2.24.  Discussed with Dr. Shirlee Latch, will resume warfarin today.  PTA Coumadin dose 2 mg daily.  Per patient report was recently decreased from 2 mg daily except 3 mg on MWF.  Goal of Therapy:  INR 1.7-2.3 (per [facility]) Monitor platelets by anticoagulation protocol: Yes   Plan:  1. Warfarin 1 mg x 1 tonight. 2. Daily PT/INR.  Tad Moore, BCPS  Clinical Pharmacist Pager [phone]  04/29/2017 8:06 AM

## 2017-04-29 NOTE — Consult Note (Signed)
Physical Medicine and Rehabilitation Consult  Reason for Consult: Debility  Referring Physician: Dr. Gala Romney    HPI: Plumer Mittelstaedt is a 76 y.o. 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 He was found to be anemic with Hgb 6.6, have acute on chronic renal failure and was found to be septic due to HCAP. He was started on broad spectrum antibiotics and coumadin held. He been transfuse with multiple units PRBCs and WOC following for multiple wounds as well as MASD.  Has required vasopressors as well as levophed to MAP.  Hgb stable and no no plans for repeat scope--INR goal 1.7- 2.3 range. Patient reported to have functional decline in the past 6 months felt to be due to progression of his commodities. CIR recommended due to debility with decline in mobility and ability to carry out ADL tasks.    Review of Systems  Constitutional: Positive for malaise/fatigue.  HENT: Positive for hearing loss. Negative for tinnitus.   Eyes: Positive for blurred vision. Negative for double vision.  Respiratory: Negative for cough.   Cardiovascular: Positive for leg swelling. Negative for chest pain and palpitations.  Gastrointestinal: Positive for diarrhea. Negative for abdominal pain, heartburn and nausea.  Musculoskeletal: Positive for back pain and myalgias.  Skin: Positive for itching and rash.  Neurological: Positive for weakness. Negative for dizziness and headaches.  Psychiatric/Behavioral: Negative for memory loss.  All other systems reviewed and are negative.     Past Medical History:  Diagnosis Date  . Allergy   . Anemia   . Anemia of chronic renal failure, stage 4 (severe) (HCC) 02/11/2017  . Angina   . Anxiety   . Blood transfusion   . CAD (coronary artery disease)   .  Cataract   . CHF (congestive heart failure) (HCC)   . Clotting disorder (HCC)   . COPD (chronic obstructive pulmonary disease) (HCC)   . Diabetes mellitus   . DVT of lower extremity (deep venous thrombosis) (HCC)    left  . Gout   . H/O hiatal hernia   . Heart murmur   . High cholesterol   . Hypertension   . Hypotension    ORTHOSTATIC  . Iron deficiency anemia due to chronic blood loss 02/11/2017  . Jaundice    "@ birth & when I was in 7th grade"  . Malabsorption of iron 02/11/2017  . Mitral valve disorder   . Myocardial infarction (HCC) 1988  . Neuromuscular disorder (HCC)   . Osteoporosis   . Pneumonia 1993; 2009   "both serious cases"  . Pneumonia 2010   "not hospitalized"  . Pulmonary embolism on left (HCC) 1988  . 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 Shortness of breath on exertion   . Sleep apnea   . Stomach ulcer 1987-1988  . Stroke [facility]) 1987   "brain stem; skin sensitive since then; once in awhile slur"12/13/11)    Past Surgical History:  Procedure Laterality Date  . BIV  11/2010   ICD 12/11 MEDTRONIC  . CARDIAC CATHETERIZATION  12/13/11  . CATARACT EXTRACTION W/ INTRAOCULAR LENS  IMPLANT, BILATERAL  1990's  . CORONARY ANGIOPLASTY  1988  . CORONARY ANGIOPLASTY WITH STENT PLACEMENT  2003   "1"  . EXPLORATORY  LAPAROTOMY W/ BOWEL RESECTION  1993  . HIP FRACTURE SURGERY Right 2014  . IMPLANTABLE CARDIOVERTER DEFIBRILLATOR GENERATOR CHANGE  08/19/13   MDT Coletta Memos CRT-D generator change by Dr Wilford Grist at [facility].  RV lead with chronically elevated threshold and low R waves not revised.  LV lead with chronic diaphrgramtic stim turned off at [facility].  . leff ventricular assist device (LVAD)  05/02/12   LVAD implanted at [facility]  . RIGHT HEART CATHETERIZATION N/A 12/13/2011   Procedure: RIGHT HEART CATH;  Surgeon: Dolores Patty, MD;  Location: [facility] CATH LAB;  Service: Cardiovascular;  Laterality: N/A;  . RIGHT HEART CATHETERIZATION N/A 02/08/2012   Procedure: RIGHT HEART  CATH;  Surgeon: Dolores Patty, MD;  Location: [facility] CATH LAB;  Service: Cardiovascular;  Laterality: N/A;  . TRACHEOSTOMY  1993    Family History  Problem Relation Age of Onset  . Cancer Mother   . Heart disease Mother   . Cancer Father   . Prostate cancer Brother       Social History:  Married. Sedentary and needed assistance with assistance. Retired He was able to ambulate with walker/walker. Per  reports that he quit smoking about 30 years ago. His smoking use included Cigarettes. He has a 30.00 pack-year smoking history. He has never used smokeless tobacco. He reports that he does not drink alcohol or use drugs.    Allergies  Allergen Reactions  . Amoxicillin-Pot Clavulanate Rash  . Levofloxacin Other (See Comments)    Doesn't tolerate  . Oxycodone Hives  . Ace Inhibitors Rash  . Ceftriaxone Rash  . Cefuroxime Rash  . Chlorhexidine Gluconate Rash    H/o recurrent cellulitis and rash   . Clindamycin/Lincomycin Rash    Erythematous drug eruption  . Hydralazine Itching  . Meropenem Rash  . Sulfonamide Derivatives Rash    Mother was highly allergic. Patient untested.   . Vancomycin Rash  . Zosyn [Piperacillin Sod-Tazobactam So] Rash    No other systemic symptoms    Medications Prior to Admission  Medication Sig Dispense Refill  . acetaminophen (TYLENOL) 500 MG tablet Take 1,000 mg by mouth every 6 (six) hours as needed.     [name] albuterol (PROVENTIL HFA;VENTOLIN HFA) 108 (90 BASE) MCG/ACT inhaler Inhale 1-2 puffs into the lungs as needed for wheezing or shortness of breath.    . budesonide-formoterol (SYMBICORT) 160-4.5 MCG/ACT inhaler Inhale 2 puffs into the lungs 2 (two) times daily.     . calcium carbonate (OS-CAL) 600 MG TABS tablet Take 1,200 mg by mouth 2 (two) times daily.     . Cholecalciferol (VITAMIN D3) 2000 UNITS TABS Take 1 tablet by mouth daily.    . colchicine 0.6 MG tablet Take 0.6 mg by mouth daily.    . Darbepoetin Alfa (ARANESP) 100 MCG/0.5ML SOSY  injection Inject into the skin. Every three week last one May 11 th next on due  June 1st    . diclofenac sodium (VOLTAREN) 1 % GEL Apply topically.    [name] doxycycline (VIBRA-TABS) 100 MG tablet Take 1 tablet (100 mg total) by mouth 2 (two) times daily. 14 tablet 0  . ferrous sulfate 325 (65 FE) MG tablet Take 325 mg by mouth.     . hydroxychloroquine (PLAQUENIL) 200 MG tablet Take by mouth daily. 2 tabs with breakfast    . Melatonin 3-10 MG TABS Take 5 mg by mouth at bedtime as needed.     . metoprolol tartrate (LOPRESSOR) 25 MG tablet Take 25 mg by  mouth 2 (two) times daily.    . Mirtazapine (REMERON PO) Take 15 mg by mouth at bedtime.     . Multiple Vitamin (MULTIVITAMIN) tablet Take 1 tablet by mouth daily.    . pantoprazole (PROTONIX) 40 MG tablet Take 40 mg by mouth 2 (two) times daily.     . pravastatin (PRAVACHOL) 40 MG tablet Take one by mouth daily    . predniSONE (DELTASONE) 10 MG tablet Take 10 mg by mouth as needed.     . tamsulosin (FLOMAX) 0.4 MG CAPS capsule Take 0.4 mg by mouth daily.    [name] torsemide (DEMADEX) 20 MG tablet Take 20 mg by mouth every morning. May take an extra tab in the evening as needed.    [name] ULORIC 80 MG TABS Take 80 mg by mouth daily.     [name] warfarin (COUMADIN) 2 MG tablet Take 1-3 mg by mouth as directed. Managed by [facility] Patient checks at home      Home: Home Living Family/patient expects to be discharged to:: Private residence Living Arrangements: Spouse/significant other Available Help at Discharge: Family, Available 24 hours/day Type of Home: House Home Access: Ramped entrance Home Layout: One level Bathroom Shower/Tub: Psychologist, counselling, Tub/shower unit Home Equipment: Environmental consultant - 2 wheels, Shower seat  Functional History: Prior Function Level of Independence: Needs assistance Gait / Transfers Assistance Needed: used a RW ADL's / Homemaking Assistance Needed: wife has been assisting with bathing and dressing Functional Status:  Mobility: Bed  Mobility Overal bed mobility: Needs Assistance Bed Mobility: Supine to Sit Supine to sit: Mod assist, HOB elevated General bed mobility comments: cues for sequence with assist to initiate pivot to EOB and assist to elevate trunk. Transferred to right due to lines, pt normally exits on left Transfers Overall transfer level: Needs assistance Equipment used:  (2 person lift with gait belt ) Transfers: Sit to/from Stand Sit to Stand: Mod assist, From elevated surface, +2 physical assistance Stand pivot transfers: Min assist, +2 physical assistance General transfer comment: pt able to stand from elevated bed with mod assist of 1 person with cues for anterior translation and assist to extend hips and trunk in standing. From lower chair required 2 person mod assist with cues for hand placement and assist for transition to full standing Ambulation/Gait Ambulation/Gait assistance: Min assist, +2 safety/equipment Ambulation Distance (Feet): 10 Feet Assistive device: Rolling walker (2 wheeled) Gait Pattern/deviations: Step-through pattern, Decreased stride length, Trunk flexed General Gait Details: cues for posture, position in RW, breathing technique and chair to follow Gait velocity interpretation: Below normal speed for age/gender    ADL:    Cognition: Cognition Overall Cognitive Status: Within Functional Limits for tasks assessed Orientation Level: Oriented X4 Cognition Arousal/Alertness: Awake/alert Behavior During Therapy: Flat affect Overall Cognitive Status: Within Functional Limits for tasks assessed General Comments: pt followed commands however had minimal eye opening and was fearful of falling   Blood pressure (!) 73/63, pulse (!) 118, temperature 98.2 F (36.8 C), temperature source Oral, resp. rate 17, height 5\' 7"  (1.702 m), weight 89.8 kg (197 lb 15.6 oz), SpO2 100 %. Physical Exam  Nursing note and vitals reviewed. Constitutional: He is oriented to person, place, and  time. He appears well-developed.  Fatigued appearing elderly male in mild discomfort due to scrotal and buttock pain.   HENT:  Head: Normocephalic and atraumatic.  Mouth/Throat: Oropharynx is clear and moist.  Eyes: Conjunctivae are normal. Pupils are equal, round, and reactive to light. Right eye exhibits no discharge. Left  eye exhibits no discharge.  Neck: Normal range of motion. Neck supple.  Cardiovascular: Tachycardia present.   + hum  Respiratory: No stridor. No respiratory distress.  Tachypnea  GI: Soft. Bowel sounds are normal. He exhibits no distension. There is no tenderness.  Musculoskeletal: He exhibits edema and tenderness.  Neurological: He is alert and oriented to person, place, and time.  Motor: limited due to pain and fatigue 4-/5 grossly thoughout  Skin: Skin is warm and dry.  Edematous with multiple bruises and erythematous appearing.  Multiple skin tears Vascular changes b/l LE  Psychiatric:  Limited due to pain and fatigue    Results for orders placed or performed during the hospital encounter of 05/01/2017 (from the past 24 hour(s))  Lactate dehydrogenase     Status: None   Collection Time: 04/29/17  4:00 AM  Result Value Ref Range   LDH 130 98 - 192 U/L  Protime-INR     Status: Abnormal   Collection Time: 04/29/17  4:00 AM  Result Value Ref Range   Prothrombin Time 25.1 (H) 11.4 - 15.2 seconds   INR 2.24   Basic metabolic panel     Status: Abnormal   Collection Time: 04/29/17  4:00 AM  Result Value Ref Range   Sodium 138 135 - 145 mmol/L   Potassium 4.8 3.5 - 5.1 mmol/L   Chloride 111 101 - 111 mmol/L   CO2 23 22 - 32 mmol/L   Glucose, Bld 103 (H) 65 - 99 mg/dL   BUN 52 (H) 6 - 20 mg/dL   Creatinine, Ser 9.50 (H) 0.61 - 1.24 mg/dL   Calcium 7.9 (L) 8.9 - 10.3 mg/dL   GFR calc non Af Amer 41 (L) >60 mL/min   GFR calc Af Amer 48 (L) >60 mL/min   Anion gap 4 (L) 5 - 15  CBC     Status: Abnormal   Collection Time: 04/29/17  4:00 AM  Result Value Ref  Range   WBC 10.9 (H) 4.0 - 10.5 K/uL   RBC 3.31 (L) 4.22 - 5.81 MIL/uL   Hemoglobin 9.1 (L) 13.0 - 17.0 g/dL   HCT 72.2 (L) 57.5 - 05.1 %   MCV 92.1 78.0 - 100.0 fL   MCH 27.5 26.0 - 34.0 pg   MCHC 29.8 (L) 30.0 - 36.0 g/dL   RDW 83.3 (H) 58.2 - 51.8 %   Platelets 266 150 - 400 K/uL   No results found.  Assessment/Plan: Diagnosis: Debility  Labs independently reviewed.  Records reviewed and summated above.  1. Does the need for close, 24 hr/day medical supervision in concert with the patient's rehab needs make it unreasonable for this patient to be served in a less intensive setting? Yes 2. 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 Due to safety, skin/wound care, disease management, pain management and patient education, does the patient require 24 hr/day rehab nursing? Yes 4. Does the patient require coordinated care of a physician, rehab nurse, PT (1-2 hrs/day, 5 days/week) and OT (1-2 hrs/day, 5 days/week)  to address physical and functional deficits in the context of the above medical diagnosis(es)? Yes Addressing deficits in the following areas: balance, endurance, locomotion, strength, transferring, bathing, dressing, toileting and psychosocial support 5. Can the patient actively participate in an intensive therapy program of at least 3 hrs of therapy per day at least 5 days per week?  Potentially 6. The potential for patient to make measurable gains while on inpatient rehab is excellent 7. Anticipated functional outcomes upon discharge from inpatient rehab are supervision and min assist  with PT, supervision and min assist with OT, n/a with SLP. 8. Estimated rehab length of stay to reach the above functional goals is: 16-19 days. 9. Anticipated D/C setting: Home 10. Anticipated post D/C treatments: HH therapy and Home excercise program 11. Overall Rehab/Functional Prognosis: good  RECOMMENDATIONS: This patient's condition is appropriate for continued rehabilitative care in the following setting: CIR if patient demonstates ability to tolerate 3 hours therapy/day.  Currently pt limited by fatigue. Patient has agreed to participate in recommended program. Potentially Note that insurance prior authorization may be required for reimbursement for recommended care.  Comment: Rehab Admissions Coordinator to follow up.  Maryla Morrow, MD, [address], [place] 04/29/2017

## 2017-04-29 NOTE — Progress Notes (Signed)
MD made aware of open abrasions bilateral buttocks. Pt states that "it happened while working with PT a few days ago."  Wounds very painful for patient and bleeding. Gerhardt's butt cream ordered and applied. Patient is incontinent of stool, and has had three soft brown BM's today which is making the wound more painful and difficult to keep clean. Skin looks sloughed off. Pt also has fluid filled blisters which have been covered with pink foam on thighs, hips and back. One has now formed on his left arm. Patient not placed supine this shift. Bony prominences covered with pink foam and patient turned frequently. Fan ordered and placed at patients bedside to increase air circulation. Will continue to monitor very closely.  Delories Heinz, RN

## 2017-04-29 NOTE — Progress Notes (Signed)
I will begin discussions with pt and wife concerning a possible inpt rehab admission once able to tolerate more therapy. [phone]

## 2017-04-30 ENCOUNTER — Other Ambulatory Visit: Payer: Self-pay

## 2017-04-30 ENCOUNTER — Encounter (HOSPITAL_COMMUNITY): Payer: Medicare Other

## 2017-04-30 DIAGNOSIS — L511 Stevens-Johnson syndrome: Secondary | ICD-10-CM

## 2017-04-30 DIAGNOSIS — I482 Chronic atrial fibrillation: Secondary | ICD-10-CM

## 2017-04-30 DIAGNOSIS — N184 Chronic kidney disease, stage 4 (severe): Secondary | ICD-10-CM

## 2017-04-30 LAB — CBC
HCT: 29.9 % — ABNORMAL LOW (ref 39.0–52.0)
Hemoglobin: 8.9 g/dL — ABNORMAL LOW (ref 13.0–17.0)
MCH: 27.8 pg (ref 26.0–34.0)
MCHC: 29.8 g/dL — ABNORMAL LOW (ref 30.0–36.0)
MCV: 93.4 fL (ref 78.0–100.0)
PLATELETS: 286 10*3/uL (ref 150–400)
RBC: 3.2 MIL/uL — AB (ref 4.22–5.81)
RDW: 19.4 % — ABNORMAL HIGH (ref 11.5–15.5)
WBC: 11.2 10*3/uL — ABNORMAL HIGH (ref 4.0–10.5)

## 2017-04-30 LAB — BASIC METABOLIC PANEL
ANION GAP: 6 (ref 5–15)
Anion gap: 5 (ref 5–15)
BUN: 50 mg/dL — ABNORMAL HIGH (ref 6–20)
BUN: 50 mg/dL — AB (ref 6–20)
CALCIUM: 8 mg/dL — AB (ref 8.9–10.3)
CO2: 23 mmol/L (ref 22–32)
CO2: 22 mmol/L (ref 22–32)
CREATININE: 1.51 mg/dL — AB (ref 0.61–1.24)
Calcium: 8.1 mg/dL — ABNORMAL LOW (ref 8.9–10.3)
Chloride: 110 mmol/L (ref 101–111)
Chloride: 109 mmol/L (ref 101–111)
Creatinine, Ser: 1.55 mg/dL — ABNORMAL HIGH (ref 0.61–1.24)
GFR calc Af Amer: 48 mL/min — ABNORMAL LOW (ref >60)
GFR calc non Af Amer: 41 mL/min — ABNORMAL LOW (ref >60)
GFR, EST AFRICAN AMERICAN: 49 mL/min — AB (ref >60)
GFR, EST NON AFRICAN AMERICAN: 42 mL/min — AB (ref >60)
GLUCOSE: 101 mg/dL — AB (ref 65–99)
Glucose, Bld: 92 mg/dL (ref 65–99)
Potassium: 5 mmol/L (ref 3.5–5.1)
Potassium: 5.1 mmol/L (ref 3.5–5.1)
SODIUM: 136 mmol/L (ref 135–145)
Sodium: 139 mmol/L (ref 135–145)

## 2017-04-30 LAB — MAGNESIUM: MAGNESIUM: 2.1 mg/dL (ref 1.7–2.4)

## 2017-04-30 LAB — PROTIME-INR
INR: 1.77
PROTHROMBIN TIME: 20.8 s — AB (ref 11.4–15.2)

## 2017-04-30 LAB — LACTATE DEHYDROGENASE: LDH: 131 U/L (ref 98–192)

## 2017-04-30 MED ORDER — AMIODARONE IV BOLUS ONLY 150 MG/100ML
150.0000 mg | Freq: Once | INTRAVENOUS | Status: AC
  Administered 2017-04-30: 150 mg via INTRAVENOUS
  Filled 2017-04-30: qty 100

## 2017-04-30 MED ORDER — AMIODARONE HCL IN DEXTROSE 360-4.14 MG/200ML-% IV SOLN
60.0000 mg/h | INTRAVENOUS | Status: AC
  Administered 2017-04-30: 60 mg/h via INTRAVENOUS
  Filled 2017-04-30: qty 200

## 2017-04-30 MED ORDER — FENTANYL CITRATE (PF) 100 MCG/2ML IJ SOLN
12.5000 ug | INTRAMUSCULAR | Status: DC | PRN
  Administered 2017-04-30 – 2017-05-05 (×12): 12.5 ug via INTRAVENOUS
  Filled 2017-04-30 (×15): qty 2

## 2017-04-30 MED ORDER — MAGNESIUM SULFATE 2 GM/50ML IV SOLN
INTRAVENOUS | Status: AC
  Filled 2017-04-30: qty 50

## 2017-04-30 MED ORDER — WARFARIN SODIUM 3 MG PO TABS
3.0000 mg | ORAL_TABLET | Freq: Once | ORAL | Status: AC
  Administered 2017-04-30: 3 mg via ORAL
  Filled 2017-04-30: qty 1

## 2017-04-30 MED ORDER — FENTANYL CITRATE (PF) 100 MCG/2ML IJ SOLN: 12.5000 ug | INTRAMUSCULAR | Status: DC | PRN

## 2017-04-30 MED ORDER — AMIODARONE HCL IN DEXTROSE 360-4.14 MG/200ML-% IV SOLN
30.0000 mg/h | INTRAVENOUS | Status: DC
  Administered 2017-04-30 – 2017-05-04 (×7): 30 mg/h via INTRAVENOUS
  Filled 2017-04-30 (×7): qty 200

## 2017-04-30 MED ORDER — MAGNESIUM SULFATE 2 GM/50ML IV SOLN
2.0000 g | Freq: Once | INTRAVENOUS | Status: AC
  Administered 2017-04-30: 2 g via INTRAVENOUS
  Filled 2017-04-30: qty 50

## 2017-04-30 NOTE — Progress Notes (Signed)
   Called by nursing staff for rhythm change.   NSVT noted. [employer]tr[employer] interrogated his device.   Check Mag and K now.   Samantha Olivera NP-C  4:15 PM

## 2017-04-30 NOTE — Progress Notes (Signed)
Patient ID: [name], male   DOB: 1941-03-24, 76 y.o.   MRN: [id number]   Advanced Heart Failure VAD Team Note  Subjective:    Admitted from [facility] 04/25/2017 with black, tarry stools and Hgb 6.6.  Hgb stable 8.9.   Remains on Vancomycin and Aztreonam. WBC 11.2. Cultures remain negative.   Complaining of buttock /groin/leg/arm pain. Multiple blisters. Rash noted after antibiotics started. Denies SOB.   LVAD INTERROGATION:  HeartMate II LVAD:  Flow 6 liters/min, speed 9400, power 6, PI 4.5.    Objective:    Vital Signs:   Temp:  [98 F (36.7 C)-98.5 F (36.9 C)] 98.5 F (36.9 C) (05/29 0700) Pulse Rate:  [40-134] 86 (05/29 0700) Resp:  [11-25] 11 (05/29 0700) SpO2:  [89 %-100 %] 94 % (05/29 0700) Weight:  [198 lb 6.6 oz (90 kg)] 198 lb 6.6 oz (90 kg) (05/29 0452) Last BM Date: 04/29/17 Mean arterial Pressure 70-80s   Intake/Output:   Intake/Output Summary (Last 24 hours) at 04/30/17 0807 Last data filed at 04/30/17 0500  Gross per 24 hour  Intake             1230 ml  Output               50 ml  Net             1180 ml   Physical Exam: CVP 5-6 GENERAL: Appears fatigued. Moaning. No acute distress.  HEENT: normal  NECK: Supple, JVP 5-6 .  2+ bilaterally, no bruits.  No lymphadenopathy or thyromegaly appreciated.   CARDIAC:  Irregular Mechanical heart sounds with LVAD hum present.  LUNGS:  Clear to auscultation bilaterally.  ABDOMEN:  Soft, round, nontender, positive bowel sounds x4.     LVAD exit site: well-healed and incorporated.  Dressing dry and intact.  No erythema or drainage.  Stabilization device present and accurately applied.  Driveline dressing is being changed daily per sterile technique. EXTREMITIES:  Warm and dry, no cyanosis, clubbing, rash or edema  NEUROLOGIC:  Alert and oriented x 4.  Gait steady.  No aphasia.  No dysarthria.  Affect pleasant. Skin:  Sloughing of skin on extremities, penis, and buttocks. Partial thickness skin loss.  Also with  small vesicle on R hip.    Telemetry:  Afib 90s personally reviewed.    Labs: Basic Metabolic Panel:  Recent Labs Lab 04/26/17 0545 04/26/17 0852 04/27/17 0435 04/28/17 0430 04/29/17 0400 04/30/17 0427  NA 138  --  139 139 138 139  K 4.1  --  4.7 4.8 4.8 5.0  CL 112*  --  112* 112* 111 110  CO2 20*  --  21* 23 23 23   GLUCOSE 166*  --  116* 80 103* 92  BUN 65*  --  55* 52* 52* 50*  CREATININE 1.64*  --  1.55* 1.56* 1.55* 1.51*  CALCIUM 7.2*  --  8.1* 8.0* 7.9* 8.0*  MG  --  1.7  --   --   --   --     Liver Function Tests:  Recent Labs Lab 04/11/2017 1017 04/22/2017 1455  AST 25 19  ALT 27 23  ALKPHOS 83 79  BILITOT 0.60 0.6  PROT 5.3* 5.0*  ALBUMIN 2.2* 2.1*   No results for input(s): LIPASE, AMYLASE in the last 168 hours. No results for input(s): AMMONIA in the last 168 hours.  CBC:  Recent Labs Lab 04/10/2017 1017  04/26/17 4098 04/27/17 0435 04/28/17 0430 04/29/17 0400 04/30/17 1191  WBC 24.4*  < > 23.1* 13.4* 11.2* 10.9* 11.2*  NEUTROABS 22.8*  --   --   --   --   --   --   HGB 6.6*  < > 9.3* 9.5* 9.2* 9.1* 8.9*  HCT 22.1*  < > 29.6* 30.9* 31.0* 30.5* 29.9*  MCV 95  < > 89.4 90.4 92.0 92.1 93.4  PLT 382  < > 251 219 237 266 286  < > = values in this interval not displayed.  INR:  Recent Labs Lab 04/26/17 0545 04/27/17 0435 04/28/17 0430 04/29/17 0400 04/30/17 0427  INR 3.89 3.33 2.57 2.24 1.77    Other results:   Imaging: No results found.   Medications:     Scheduled Medications: . calcium carbonate  1 tablet Oral BID WC  . cholecalciferol  1,000 Units Oral Daily  . citalopram  10 mg Oral Daily  . febuxostat  40 mg Oral Daily  . feeding supplement  237 mL Oral Q24H  . ferrous sulfate  325 mg Oral Q breakfast  . Gerhardt's butt cream   Topical TID  . hydroxychloroquine  400 mg Oral QPC breakfast  . mouth rinse  15 mL Mouth Rinse BID  . mirtazapine  7.5 mg Oral QHS  . mometasone-formoterol  2 puff Inhalation BID  .  pantoprazole (PROTONIX) IV  40 mg Intravenous Q12H  . pravastatin  40 mg Oral Daily  . sodium chloride flush  10-40 mL Intracatheter Q12H  . tamsulosin  0.4 mg Oral Daily  . Warfarin - Pharmacist Dosing Inpatient   Does not apply q1800    Infusions: . sodium chloride    . aztreonam Stopped (04/30/17 0929)  . norepinephrine (LEVOPHED) Adult infusion Stopped (04/26/17 1855)  . vancomycin Stopped (04/30/17 0008)  . vasopressin (PITRESSIN) infusion - *FOR SHOCK* Stopped (04/27/17 1230)    PRN Medications: acetaminophen, albuterol, diphenhydrAMINE, fentaNYL (SUBLIMAZE) injection, hydrOXYzine, Melatonin, ondansetron (ZOFRAN) IV, sodium chloride flush, Zinc Oxide   Assessment/Plan   1. 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 Noted to have functional SBO with 3 retained capsule cameras.  Has had multiple GI work-ups at [facility]. Would not repeat scope unless rebleeds. INR 1.7. No signs of bleeding.  -- Has had multiple transfusions this admission. Hemoglobin now stable with no further overt bleeding.  -- Continue coumadin. d/w PharmD 2. Chronic systolic CHF due to ICM s/p LVAD 04/2012 at [facility] for DT.   --Hold diuretics. CVP 5-6 3. Chronic anticoagulation for LVAD/afib:  --Goal INR 1.7-2.3, no ASA.  INR 1.77.  Pharm D dosing.  4. Chronic Afib:  -- INR 1.77. Discussed with Pharm D. Rate controlled.    5. Shock Septic with productive cough: suspect HCAP.  CXR with L basilar airspace opacity.  PCT falling, WBCs falling.  Off pressors.   -- MAP 70-80s. 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 He has had 7 days of antibiotics.  6. AKI on CKD IV:  Creatinine stable 1.51  7. SJS- Rash . Extremities, penis, buttocks.  Partial thickness skin loss. Concerning for SJS. Stop vancomycin.  Continue silicone dressing to extremities would not remove for 5  days.  Stop incontinent cream to buttocks. Try to use xeroform without tape to cover partial thickness wounds.  Will need to place on KinAir bed. Reposition R to L only.  8. Depression --  He has multiple somatic complaints and appears to have little hope. Continue Celexa. 9. Dispo- Holding on d/c with severe skin sloughing.   Discussed with is wife at the bedside.   I reviewed the LVAD parameters from today, and compared the results to the patient's prior recorded data.  No programming changes were made.  The LVAD is functioning within specified parameters.  The nurse performs LVAD self-test daily.  LVAD interrogation was negative for any significant power changes, alarms or PI events/speed drops.  LVAD equipment check completed and is in good working order.  Back-up equipment present.   LVAD education done on emergency procedures and precautions and reviewed exit site care.  Length of Stay: 7  [name], NP 04/30/2017, 8:07 AM  VAD Team --- VAD ISSUES ONLY--- Pager [phone] (7am - 7am)  Advanced Heart Failure Team  Pager [phone] (M-F; 7a - 4p)  Please contact [facility] for night-coverage after hours (4p -7a ) and weekends on [email or web address]   Patient seen and examined with [name], NP. We discussed all aspects of the encounter. I agree with the assessment and plan as stated above.   PNA and septic shock much improved after 7 days IV abx.   Rash much worse today with skin sloughing concerning for [employer]. Appreciate WOC recommendations. Wife and patient aware that skin loss may progress.  Volume status and renal function stable.   Remains in AF with good rate control. INR 1.77. Continue coumadin. Discussed with PharmD.   Hgb stable.   Keep in ICU today.   Arvilla Meres, MD  9:59 AM

## 2017-04-30 NOTE — Progress Notes (Signed)
ANTICOAGULATION CONSULT NOTE   Pharmacy Consult for warfarin Indication: LVAD  Allergies  Allergen Reactions  . Amoxicillin-Pot Clavulanate Rash  . Vancomycin Hives    Red rash with blisters/sloughing  . Levofloxacin Rash    Doesn't tolerate  . Oxycodone Hives  . Ace Inhibitors Rash  . Ceftriaxone Rash  . Cefuroxime Rash  . Chlorhexidine Gluconate Rash    H/o recurrent cellulitis and rash   . Clindamycin/Lincomycin Rash    Erythematous drug eruption  . Hydralazine Itching  . Meropenem Rash  . Sulfonamide Derivatives Rash    Mother was highly allergic. Patient untested.   [name] Zosyn [Piperacillin Sod-Tazobactam So] Rash    No other systemic symptoms    Patient Measurements: Height: 5\' 7"  (170.2 cm) Weight: 198 lb 6.6 oz (90 kg) IBW/kg (Calculated) : 66.1 Heparin Dosing Weight: n/a  Vital Signs: Temp: 98.5 F (36.9 C) (05/29 0700) Temp Source: Oral (05/29 0700) Pulse Rate: 86 (05/29 0700)  Labs:  Recent Labs  04/28/17 0430 04/29/17 0400 04/30/17 0427  HGB 9.2* 9.1* 8.9*  HCT 31.0* 30.5* 29.9*  PLT 237 266 286  LABPROT 28.1* 25.1* 20.8*  INR 2.57 2.24 1.77  CREATININE 1.56* 1.55* 1.51*    Estimated Creatinine Clearance: 43.2 mL/min (A) (by C-G formula based on SCr of 1.51 mg/dL (H)).   Medical History: Past Medical History:  Diagnosis Date  . Allergy   . Anemia   . Anemia of chronic renal failure, stage 4 (severe) (HCC) 02/11/2017  . Angina   . Anxiety   . Blood transfusion   . CAD (coronary artery disease)   . Cataract   . CHF (congestive heart failure) (HCC)   . Clotting disorder (HCC)   . COPD (chronic obstructive pulmonary disease) (HCC)   . Diabetes mellitus   . DVT of lower extremity (deep venous thrombosis) (HCC)    left  . Gout   . H/O hiatal hernia   . Heart murmur   . High cholesterol   . Hypertension   . Hypotension    ORTHOSTATIC  . Iron deficiency anemia due to chronic blood loss 02/11/2017  . Jaundice    "@ birth & when I was  in 7th grade"  . Malabsorption of iron 02/11/2017  . Mitral valve disorder   . Myocardial infarction (HCC) 1988  . Neuromuscular disorder (HCC)   . Osteoporosis   . Pneumonia 1993; 2009   "both serious cases"  . Pneumonia 2010   "not hospitalized"  . Pulmonary embolism on left (HCC) 1988  . 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 Shortness of breath on exertion   . Sleep apnea   . Stomach ulcer 1987-1988  . 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 calcium carbonate  1 tablet Oral BID WC  . cholecalciferol  1,000 Units Oral Daily  . citalopram  10 mg Oral Daily  . febuxostat  40 mg Oral Daily  . feeding supplement  237 mL Oral Q24H  . ferrous sulfate  325 mg Oral Q breakfast  . hydroxychloroquine  400 mg Oral QPC breakfast  . mouth rinse  15 mL Mouth Rinse BID  . mirtazapine  7.5 mg Oral QHS  . mometasone-formoterol  2 puff Inhalation BID  . pantoprazole (PROTONIX) IV  40 mg Intravenous Q12H  . pravastatin  40 mg Oral Daily  . sodium chloride flush  10-40 mL  Intracatheter Q12H  . tamsulosin  0.4 mg Oral Daily  . Warfarin - Pharmacist Dosing Inpatient   Does not apply q1800    Assessment: 76 yo male with LVAD on chronic warfarin.  Admitted with supratherapeutic INR of 4.12 and GIB. Warfarin was held 5/22-5/27 and restarted 5/28.   INR remains therapeutic at 1.77, a decrease from yesterday. Hemoglobin and platelet count remain stable after several transfusions this admission and no further bleeding noted. Of note, antibiotics were stopped today due to sloughing rash.   PTA Warfarin dose 2 mg daily.  Per patient report was recently decreased from 2 mg daily except 3 mg on MWF. No aspirin.  Goal of Therapy:  INR 1.7-2.3 (per [facility]) Monitor platelets by anticoagulation protocol: Yes   Plan:  Warfarin 3 mg x 1 tonight. Daily PT/INR/LDH  Carylon Perches, PharmD Acute Care Pharmacy Resident   Pager: [phone] 04/30/2017

## 2017-04-30 NOTE — Progress Notes (Signed)
Physical Therapy Treatment Patient Details Name: [name] MRN: [id number] DOB: 28-May-1941 Today's Date: 04/30/2017    History of Present Illness [name] [name] a 76 y.o.malewith CAD with PCI, ICM, afib, hx of CVA in 1987, hx of DVT/PE, HTN, hyperlipidemia, CKD III, and chronic systolic HF. Pt had [name] implant at [facility] on 04/2012. Admitted from [facility] 04/19/2017 with black, tarry stools and Hgb 6.6.    PT Comments    Patient seen for activity progression. Patient extremely limited with mobility and activity tolerance today. Required increased physical assist. To come to EOB and stand with use of Eva walker and 2 person physical assist. Patient in standing attempted to take multiple steps in room but could not tolerate physically. Assisted patient into chair with geo mat air cushion. HR changes noted with variation in rate and rhythm (nursing notified) and patient with poor coloration, reports modest SOB but saturations stable. After period of sitting rest in the chair, patient assisted back to bed and positioned for comfort towards left side.   OF NOTE: Nurse and LVAD coordinator updated [id number] status and concerns RE:2 pad changes due to blood from wounds during session, decreased activity tolerance as well as patient controller very hot to touch.   Will continue to see and progress as tolerated.   Follow Up Recommendations  CIR;Supervision/Assistance - 24 hour     Equipment Recommendations  None recommended by PT    Recommendations for Other Services Rehab consult     Precautions / Restrictions Precautions Precautions: Fall Precaution Comments: LVAD Restrictions Weight Bearing Restrictions: No    Mobility  Bed Mobility Overal bed mobility: Needs Assistance Bed Mobility: Rolling;Supine to Sit;Sit to Supine Rolling: Min assist   Supine to sit: Mod assist;HOB elevated Sit to supine: Mod assist;HOB elevated   General bed mobility comments: Increased time  and effort, physical assist to elevate to upright and position at EOB, increased physical assist required to elevate BLEs back to bed and reposition patient on left side  Transfers Overall transfer level: Needs assistance Equipment used:  (2 person lift using EVA walker) Transfers: Sit to/from Stand Sit to Stand: Mod assist;From elevated surface;+2 physical assistance Stand pivot transfers: Min assist;+2 physical assistance (face to face)       General transfer comment: pt able to stand from elevated bed with mod assist of 1 person with cues for anterior translation and assist to extend hips and trunk in standing. From lower chair required 2 person mod assist with cues for hand placement and assist for transition to full standing  Ambulation/Gait Ambulation/Gait assistance: Mod assist;Max assist;+2 safety/equipment Ambulation Distance (Feet): 8 Feet Assistive device:  (Eva walker and wrap around support) Gait Pattern/deviations: Decreased stride length;Trunk flexed;Step-to pattern Gait velocity: decreased  Gait velocity interpretation: <1.8 ft/sec, indicative of risk for recurrent falls General Gait Details: Max multi modal cues for posture and positioning, patient unable to truly direct gait just took a few steps in various directions in room with increased physical assist.. Extremely limited by pain and fatigue.   Stairs            Wheelchair Mobility    Modified Rankin (Stroke Patients Only)       Balance Overall balance assessment: Needs assistance Sitting-balance support: Feet supported;Bilateral upper extremity supported Sitting balance-Leahy Scale: Poor Sitting balance - Comments: patient with poor sitting balance due to pain in buttocks   Standing balance support: Bilateral upper extremity supported;During functional activity Standing balance-Leahy Scale: Poor Standing balance comment:  required maxAx2 to achieve and maintain standing                             Cognition Arousal/Alertness: Awake/alert Behavior During Therapy: Flat affect Overall Cognitive Status: Within Functional Limits for tasks assessed                                        Exercises      General Comments General comments (skin integrity, edema, etc.): noted wounds/blisters on buttocks, with increased bleeding from wounds (pad change x2)      Pertinent Vitals/Pain Pain Assessment: Faces Faces Pain Scale: Hurts whole lot Pain Location: buttocks wounds/ blisters Pain Descriptors / Indicators: Grimacing;Guarding;Sharp;Sore Pain Intervention(s): Monitored during session    Home Living                      Prior Function            PT Goals (current goals can now be found in the care plan section) Acute Rehab PT Goals Patient Stated Goal: didn't state PT Goal Formulation: With patient/family Time For Goal Achievement: 05/01/17 Potential to Achieve Goals: Good Progress towards PT goals: Not progressing toward goals - comment (extremely limited by activity tolerance and pain)    Frequency    Min 3X/week      PT Plan Current plan remains appropriate    Co-evaluation              AM-PAC PT "6 Clicks" Daily Activity  Outcome Measure  Difficulty turning over in bed (including adjusting bedclothes, sheets and blankets)?: Total Difficulty moving from lying on back to sitting on the side of the bed? : Total Difficulty sitting down on and standing up from a chair with arms (e.g., wheelchair, bedside commode, etc,.)?: Total Help needed moving to and from a bed to chair (including a wheelchair)?: A Lot Help needed walking in hospital room?: A Lot Help needed climbing 3-5 steps with a railing? : Total 6 Click Score: 8    End of Session Equipment Utilized During Treatment: Gait belt;Oxygen Activity Tolerance: Patient tolerated treatment well Patient left: in bed;with call bell/phone within reach;with family/visitor  present Nurse Communication: Mobility status;Precautions PT Visit Diagnosis: Difficulty in walking, not elsewhere classified (R26.2)     Time: 5631-4970 PT Time Calculation (min) (ACUTE ONLY): 36 min  Charges:  $Therapeutic Activity: 23-37 mins                    G Codes:       Charlotte Crumb, PT DPT  [phone]    Fabio Asa 04/30/2017, 4:51 PM

## 2017-04-30 NOTE — Progress Notes (Signed)
Cards fellow Tiburcio Pea made aware of increasing frequency of NSVT runs. Vital signs and VAD numbers relayed. No orders at this time. Will continue to monitor closely. Modena Jansky RN 2 Heart

## 2017-04-30 NOTE — Progress Notes (Signed)
I met with pt's wife at bedside to begin discussions for an possible eventual admission to inpt rehab if he is able to demonstrate tolerance for more intensive therapies. Pt recent admission to [facility] with a 4 months length of stay which also included a 3 week inpt Rehab admission at [facility] acute [facility]. Pt was using RW for short distances in the home.Returned home in January. I will follow pt's progress. [phone]

## 2017-04-30 NOTE — Progress Notes (Signed)
Rhythm Change. Of note, in 2012 he had 3 DC-CV for A fib and was on amioadarone.   [employer] at bedside. Device interrogated.   A tach with PVCs.   150 mg bolus amiodarone followed by amio drip. Give 2 grams of mag now.   Dr Gala Romney aware.   Judah Chevere NP-C  4:38 PM

## 2017-04-30 NOTE — Progress Notes (Signed)
Admitted 2017-04-26 due to suspected GIB due to black, tarry stools.   HeartMate II LVAD implanted on 04/2012 by [facility].   Vital signs: HR: 99 Doppler Pressure: 82 Automatic BP:  O2 Sat: 97 on RA Wt:88.1 kg  >88.9 > 90kg   LVAD interrogation reveals:  Speed: 9400 Flow: 6.0 Power:  6.2 PI:5.0 Alarms: none  Events:  Multiple PI events Fixed speed: 9400 Low speed limit: 8800  Drive Line: last changed 5/39 will be due 6/3.  Labs:  LDH trend:143>137>200>131  INR trend: 4.12>3.94>3.99>1.77  Anticoagulation Plan: -INR Goal: 2-3 -ASA Dose: none due to  GIB history  Adverse Events on VAD: -07/2013>GIB  Plan/Recommendations:   1. Weekly dressing changes. 2. Pt requested regular diet-changed per pt request.    Carlton Adam RN, VAD Coordinator 24/7 pager [phone]

## 2017-04-30 NOTE — Consult Note (Addendum)
WOC follow-up: Requested to assess buttock and groin with [name], NP.  Pt previously noted to have multiple blisters which have ruptured in several locations and evolved into pink moist partial thickness wounds, these have been covered in foam dressings to protect and promote healing. Buttocks has evolved into full thickness tissue loss in area approx 10X6X.2cm; red and moist, painful to touch.  Inner groin and base of penis with red macerated skin and patchy areas of partial thickness skin loss.  Pt's wife at the bedside states he has had rash and blisters from Vancomycin before, but is was always stopped before skin condition declined. This medication has been discontinued; pharmacy at the bedside to discuss with wife.  Pt is on a Sport low airloss bed to reduce pressure and xeroform has been applied. It will be difficult to promote healing to this site, since pt is frequently incontinent and affected area is frequently soiled. Agree with xeroform to promote drying and healing of groin and buttocks wounds and protect from further injury.  Please re-consult if further assistance is needed.  Thank-you,  Cammie Mcgee MSN, RN, CWOCN, [place], CNS [phone]

## 2017-05-01 DIAGNOSIS — I5023 Acute on chronic systolic (congestive) heart failure: Secondary | ICD-10-CM

## 2017-05-01 LAB — CBC WITH DIFFERENTIAL/PLATELET
BASOS ABS: 0 10*3/uL (ref 0.0–0.1)
Basophils Relative: 0 %
EOS ABS: 0 10*3/uL (ref 0.0–0.7)
Eosinophils Relative: 0 %
HCT: 29.7 % — ABNORMAL LOW (ref 39.0–52.0)
Hemoglobin: 8.9 g/dL — ABNORMAL LOW (ref 13.0–17.0)
Lymphocytes Relative: 9 %
Lymphs Abs: 1 10*3/uL (ref 0.7–4.0)
MCH: 28.3 pg (ref 26.0–34.0)
MCHC: 30 g/dL (ref 30.0–36.0)
MCV: 94.3 fL (ref 78.0–100.0)
Monocytes Absolute: 1.2 10*3/uL — ABNORMAL HIGH (ref 0.1–1.0)
Monocytes Relative: 11 %
Neutro Abs: 9 10*3/uL — ABNORMAL HIGH (ref 1.7–7.7)
Neutrophils Relative %: 80 %
PLATELETS: 305 10*3/uL (ref 150–400)
RBC: 3.15 MIL/uL — ABNORMAL LOW (ref 4.22–5.81)
RDW: 19.7 % — ABNORMAL HIGH (ref 11.5–15.5)
WBC: 11.2 10*3/uL — ABNORMAL HIGH (ref 4.0–10.5)

## 2017-05-01 LAB — COMPREHENSIVE METABOLIC PANEL
ALT: 17 U/L (ref 17–63)
AST: 20 U/L (ref 15–41)
Albumin: 1.6 g/dL — ABNORMAL LOW (ref 3.5–5.0)
Alkaline Phosphatase: 105 U/L (ref 38–126)
Anion gap: 6 (ref 5–15)
BUN: 51 mg/dL — ABNORMAL HIGH (ref 6–20)
CHLORIDE: 108 mmol/L (ref 101–111)
CO2: 23 mmol/L (ref 22–32)
Calcium: 7.9 mg/dL — ABNORMAL LOW (ref 8.9–10.3)
Creatinine, Ser: 1.71 mg/dL — ABNORMAL HIGH (ref 0.61–1.24)
GFR calc Af Amer: 42 mL/min — ABNORMAL LOW (ref >60)
GFR, EST NON AFRICAN AMERICAN: 37 mL/min — AB (ref >60)
Glucose, Bld: 129 mg/dL — ABNORMAL HIGH (ref 65–99)
POTASSIUM: 5 mmol/L (ref 3.5–5.1)
SODIUM: 137 mmol/L (ref 135–145)
Total Bilirubin: 0.6 mg/dL (ref 0.3–1.2)
Total Protein: 4.6 g/dL — ABNORMAL LOW (ref 6.5–8.1)

## 2017-05-01 LAB — COOXEMETRY PANEL
Carboxyhemoglobin: 2.1 % — ABNORMAL HIGH (ref 0.5–1.5)
Methemoglobin: 0.8 % (ref 0.0–1.5)
O2 Saturation: 64.4 %
Total hemoglobin: 8.5 g/dL — ABNORMAL LOW (ref 12.0–16.0)

## 2017-05-01 LAB — BASIC METABOLIC PANEL
Anion gap: 5 (ref 5–15)
BUN: 53 mg/dL — ABNORMAL HIGH (ref 6–20)
CALCIUM: 8 mg/dL — AB (ref 8.9–10.3)
CO2: 23 mmol/L (ref 22–32)
Chloride: 108 mmol/L (ref 101–111)
Creatinine, Ser: 1.67 mg/dL — ABNORMAL HIGH (ref 0.61–1.24)
GFR, EST AFRICAN AMERICAN: 44 mL/min — AB (ref >60)
GFR, EST NON AFRICAN AMERICAN: 38 mL/min — AB (ref >60)
Glucose, Bld: 94 mg/dL (ref 65–99)
POTASSIUM: 5.3 mmol/L — AB (ref 3.5–5.1)
SODIUM: 136 mmol/L (ref 135–145)

## 2017-05-01 LAB — PROTIME-INR
INR: 1.79
PROTHROMBIN TIME: 21.1 s — AB (ref 11.4–15.2)

## 2017-05-01 LAB — LACTATE DEHYDROGENASE: LDH: 139 U/L (ref 98–192)

## 2017-05-01 LAB — MAGNESIUM: MAGNESIUM: 2.6 mg/dL — AB (ref 1.7–2.4)

## 2017-05-01 MED ORDER — FUROSEMIDE 10 MG/ML IJ SOLN
40.0000 mg | Freq: Once | INTRAMUSCULAR | Status: AC
  Administered 2017-05-01: 40 mg via INTRAVENOUS
  Filled 2017-05-01: qty 4

## 2017-05-01 MED ORDER — ENSURE ENLIVE PO LIQD
237.0000 mL | Freq: Two times a day (BID) | ORAL | Status: DC
  Administered 2017-05-01 – 2017-05-11 (×16): 237 mL via ORAL

## 2017-05-01 MED ORDER — WARFARIN SODIUM 2 MG PO TABS
2.0000 mg | ORAL_TABLET | Freq: Once | ORAL | Status: AC
  Administered 2017-05-01: 2 mg via ORAL
  Filled 2017-05-01: qty 1

## 2017-05-01 NOTE — Progress Notes (Signed)
Admitted 04/07/2017 due to suspected GIB due to black, tarry stools.   HeartMate II LVAD implanted on 04/2012 by [facility].   Vital signs: HR: 71 Doppler Pressure: 85 Automatic BP:  86/69 (77) O2 Sat: 94 on 2L/ Wt:88.1 kg  >88.9 > 90kg > 91kg   LVAD interrogation reveals:  Speed: 9400 Flow: 6.0 Power:  6.3w PI: 4.4 Alarms: none Events:  75 PI events 04/30/17 Fixed speed: 9400 Low speed limit: 8800  Drive Line: last changed 3/72 will be due 6/3.  Gtts:  Amiodarone 30 mg/hr - started 04/30/17   Labs:  LDH trend:143>137>200>131>139  INR trend: 4.12>3.94>3.99>1.77>1.79  Anticoagulation Plan: -INR Goal: 2-3 -ASA Dose: none due to  GIB history  Adverse Events on VAD: -07/2013>GIB  Plan/Recommendations:   1. Weekly dressing changes.     Hessie Diener RN, VAD Coordinator 24/7 pager [phone]

## 2017-05-01 NOTE — Progress Notes (Addendum)
ANTICOAGULATION CONSULT NOTE   Pharmacy Consult for warfarin Indication: LVAD  Allergies  Allergen Reactions  . Amoxicillin-Pot Clavulanate Rash  . Vancomycin Hives    Red rash with blisters/sloughing  . Levofloxacin Rash    Doesn't tolerate  . Oxycodone Hives  . Ace Inhibitors Rash  . Ceftriaxone Rash  . Cefuroxime Rash  . Chlorhexidine Gluconate Rash    H/o recurrent cellulitis and rash   . Clindamycin/Lincomycin Rash    Erythematous drug eruption  . Hydralazine Itching  . Meropenem Rash  . Sulfonamide Derivatives Rash    Mother was highly allergic. Patient untested.   [name] Zosyn [Piperacillin Sod-Tazobactam So] Rash    No other systemic symptoms    Patient Measurements: Height: 5\' 7"  (170.2 cm) Weight: 201 lb 4.8 oz (91.3 kg) IBW/kg (Calculated) : 66.1 Heparin Dosing Weight: n/a  Vital Signs: Temp: 99.4 F (37.4 C) (05/30 0700) Temp Source: Oral (05/30 0700) BP: 86/69 (05/29 2330) Pulse Rate: 86 (05/30 0700)  Labs:  Recent Labs  04/29/17 0400 04/30/17 0427 04/30/17 1612 05/01/17 0354  HGB 9.1* 8.9*  --  8.9*  HCT 30.5* 29.9*  --  29.7*  PLT 266 286  --  305  LABPROT 25.1* 20.8*  --  21.1*  INR 2.24 1.77  --  1.79  CREATININE 1.55* 1.51* 1.55* 1.67*    Estimated Creatinine Clearance: 39.3 mL/min (A) (by C-G formula based on SCr of 1.67 mg/dL (H)).   Medical History: Past Medical History:  Diagnosis Date  . Allergy   . Anemia   . Anemia of chronic renal failure, stage 4 (severe) (HCC) 02/11/2017  . Angina   . Anxiety   . Blood transfusion   . CAD (coronary artery disease)   . Cataract   . CHF (congestive heart failure) (HCC)   . Clotting disorder (HCC)   . COPD (chronic obstructive pulmonary disease) (HCC)   . Diabetes mellitus   . DVT of lower extremity (deep venous thrombosis) (HCC)    left  . Gout   . H/O hiatal hernia   . Heart murmur   . High cholesterol   . Hypertension   . Hypotension    ORTHOSTATIC  . Iron deficiency anemia due  to chronic blood loss 02/11/2017  . Jaundice    "@ birth & when I was in 7th grade"  . Malabsorption of iron 02/11/2017  . Mitral valve disorder   . Myocardial infarction (HCC) 1988  . Neuromuscular disorder (HCC)   . Osteoporosis   . Pneumonia 1993; 2009   "both serious cases"  . Pneumonia 2010   "not hospitalized"  . Pulmonary embolism on left (HCC) 1988  . 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 Shortness of breath on exertion   . Sleep apnea   . Stomach ulcer 1987-1988  . Stroke [facility]) 1987   "brain stem; skin sensitive since then; once in awhile slur"12/13/11)    Medications:  Scheduled:  . calcium carbonate  1 tablet Oral BID WC  . cholecalciferol  1,000 Units Oral Daily  . citalopram  10 mg Oral Daily  . febuxostat  40 mg Oral Daily  . feeding supplement  237 mL Oral Q24H  . ferrous sulfate  325 mg Oral Q breakfast  . furosemide  40 mg Intravenous Once  . hydroxychloroquine  400 mg Oral QPC breakfast  . mouth rinse  15 mL Mouth Rinse BID  . mirtazapine  7.5 mg Oral QHS  . mometasone-formoterol  2 puff Inhalation BID  . pantoprazole (PROTONIX) IV  40 mg Intravenous Q12H  . pravastatin  40 mg Oral Daily  . sodium chloride flush  10-40 mL Intracatheter Q12H  . tamsulosin  0.4 mg Oral Daily  . Warfarin - Pharmacist Dosing Inpatient   Does not apply q1800    Assessment: 76 yo male with LVAD on chronic warfarin.  Admitted with supratherapeutic INR of 4.12 and GIB. Warfarin was held 5/22-5/27 and restarted 5/28.   INR remains therapeutic at 1.79, stable from yesterday. Hemoglobin and platelet count remain stable after several transfusions this admission and no further bleeding noted. Of note, antibiotics were stopped today due to sloughing rash. Patient is not eating much, has Ensure shakes. LDH is stable.  5/29 - amio started (not on PTA)  PTA Warfarin dose 2 mg daily.  Per patient report was recently decreased from 2 mg daily except 3 mg on MWF. No aspirin.  Goal  of Therapy:  INR 1.7-2.3 (per [facility]) Monitor platelets by anticoagulation protocol: Yes   Plan:  Warfarin 2mg  x1 tonight. Daily PT/INR/LDH  Carylon Perches, PharmD Acute Care Pharmacy Resident  Pager: [phone] 05/01/2017

## 2017-05-01 NOTE — Progress Notes (Signed)
Patient ID: [name], male   DOB: [birth year], 76 y.o.   MRN: [id number]   Advanced Heart Failure VAD Team Note  Subjective:    Admitted from [facility] 04/09/2017 with black, tarry stools and Hgb 6.6.  Septic/PNC- antibiotics stopped .   Remains on Vancomycin and Aztreonam. WBC 11.2.   Yesterday he had A tach with NSVT. Started on milrinone and received 2 grams .   Denies SOB. Not hungry.    LVAD INTERROGATION:  HeartMate II LVAD:  Flow 6 liters/min, speed9400, power 6.1, PI 4.4   75 PI events    Objective:    Vital Signs:   Temp:  [97.3 F (36.3 C)-99.4 F (37.4 C)] 99.4 F (37.4 C) (05/30 0700) Pulse Rate:  [52-128] 86 (05/30 0700) Resp:  [9-32] 10 (05/30 0700) BP: (86)/(69) 86/69 (05/29 2330) SpO2:  [88 %-100 %] 95 % (05/30 0700) Weight:  [201 lb 4.8 oz (91.3 kg)] 201 lb 4.8 oz (91.3 kg) (05/30 0500) Last BM Date: 04/30/17 Mean arterial Pressure 70-80s   Intake/Output:   Intake/Output Summary (Last 24 hours) at 05/01/17 0900 Last data filed at 05/01/17 0700  Gross per 24 hour  Intake           491.78 ml  Output              525 ml  Net           -33.22 ml   Physical Exam: CVP 12 GENERAL: Appear fatigued. Crying with repositioning. In bed.   HEENT: Normal NECK: Supple. JVP 9-10.  2+ bilaterally, no bruits.  No lymphadenopathy or thyromegaly appreciated.   CARDIAC: Irreguar Mechanical heart sounds with LVAD hum.   LUNGS:  Decreased in the bases on 2 iters   ABDOMEN:  Soft, round, nt. + bs.     LVAD exit site: well-healed and incorporated.  Dressing dry and intact.  No erythema or drainage.  Stabilization device present and accurately applied.  Driveline dressing is being changed daily per sterile technique. EXTREMITIES:  Warm and dry. No edema. Multiple dressing in place.  NEUROLOGIC:  Alert and oriented x 4.   No aphasia.  No dysarthria.  Affect pleasant. Skin:  I  personally assessed his skin.--> NO change from yesterday. Sloughing of skin on extremities,  penis, and buttocks. Partial thickness skin loss.  .    Telemetry: Atach/ NSVT  Labs: Basic Metabolic Panel:  Recent Labs Lab 04/26/17 0852  04/28/17 0430 04/29/17 0400 04/30/17 0427 04/30/17 1612 05/01/17 0354  NA  --   < > 139 138 139 136 136  K  --   < > 4.8 4.8 5.0 5.1 5.3*  CL  --   < > 112* 111 110 109 108  CO2  --   < > 23 23 23 22 23   GLUCOSE  --   < > 80 103* 92 101* 94  BUN  --   < > 52* 52* 50* 50* 53*  CREATININE  --   < > 1.56* 1.55* 1.51* 1.55* 1.67*  CALCIUM  --   < > 8.0* 7.9* 8.0* 8.1* 8.0*  MG 1.7  --   --   --   --  2.1 2.6*  < > = values in this interval not displayed.  Liver Function Tests: No results for input(s): AST, ALT, ALKPHOS, BILITOT, PROT, ALBUMIN in the last 168 hours. No results for input(s): LIPASE, AMYLASE in the last 168 hours. No results for input(s): AMMONIA in the last 168  hours.  CBC:  Recent Labs Lab 04/27/17 0435 04/28/17 0430 04/29/17 0400 04/30/17 0427 05/01/17 0354  WBC 13.4* 11.2* 10.9* 11.2* 11.2*  NEUTROABS  --   --   --   --  9.0*  HGB 9.5* 9.2* 9.1* 8.9* 8.9*  HCT 30.9* 31.0* 30.5* 29.9* 29.7*  MCV 90.4 92.0 92.1 93.4 94.3  PLT 219 237 266 286 305    INR:  Recent Labs Lab 04/27/17 0435 04/28/17 0430 04/29/17 0400 04/30/17 0427 05/01/17 0354  INR 3.33 2.57 2.24 1.77 1.79    Other results:   Imaging: No results found.   Medications:     Scheduled Medications: . calcium carbonate  1 tablet Oral BID WC  . cholecalciferol  1,000 Units Oral Daily  . citalopram  10 mg Oral Daily  . febuxostat  40 mg Oral Daily  . feeding supplement  237 mL Oral Q24H  . ferrous sulfate  325 mg Oral Q breakfast  . furosemide  40 mg Intravenous Once  . hydroxychloroquine  400 mg Oral QPC breakfast  . mouth rinse  15 mL Mouth Rinse BID  . mirtazapine  7.5 mg Oral QHS  . mometasone-formoterol  2 puff Inhalation BID  . pantoprazole (PROTONIX) IV  40 mg Intravenous Q12H  . pravastatin  40 mg Oral Daily  . sodium  chloride flush  10-40 mL Intracatheter Q12H  . tamsulosin  0.4 mg Oral Daily  . Warfarin - Pharmacist Dosing Inpatient   Does not apply q1800    Infusions: . sodium chloride    . amiodarone 30 mg/hr (05/01/17 0200)  . norepinephrine (LEVOPHED) Adult infusion Stopped (04/26/17 1855)  . vasopressin (PITRESSIN) infusion - *FOR SHOCK* Stopped (04/27/17 1230)    PRN Medications: acetaminophen, albuterol, diphenhydrAMINE, fentaNYL (SUBLIMAZE) injection, hydrOXYzine, Melatonin, ondansetron (ZOFRAN) IV, sodium chloride flush   Assessment/Plan   1. 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 Noted to have functional SBO with 3 retained capsule cameras.  Has had multiple GI work-ups at [facility]. Would not repeat scope unless rebleeds. INR 1.7. No signs of bleeding.  -- Has had multiple transfusions this admission. Hgb Hemoglobin now stable with no further overt bleeding.  -- Continue coumadin. d/w PharmD 2. Acute on chronic systolic CHF due to ICM s/p LVAD 04/2012 at [facility] for DT.   CVP up to 12. Give IV lasix 40 mg twice a day.  Repeat BMET at noon.  3. Chronic anticoagulation for LVAD/afib:  --Goal INR 1.7-2.3, no ASA.  INR 1.77.  Pharm D dosing.  4. Chronic Afib:  -- INR 1.79. Discussed with Pharm D.    5. Shock Septic with productive cough: suspect HCAP.  CXR with L basilar airspace opacity.  PCT falling, WBC down to 11.2 Off pressors.   -- MAP 70-80s . 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 He has had 7 days of antibiotics.  6. AKI on CKD IV:  Creatinine up to 1.6. BMET at noon.  7. 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 Extremities, penis, buttocks partial thickness skin loss.  Vanc stopped 5/29 . Today the rash does not appear worse. No new blisters identified.  Continue silicone dressing to extremities would not remove for 5 days.    Continue xeroform to buttock and penis wounds.  Continue low air loss overload and reposition R to L only.  8. Depression -- He has multiple somatic complaints and appears to have little hope. Continue Celexa. 9. Hyperkalemia - K 5.3 Not on K supplement . Hold off on kayexylate with severe skin issues. May need to use veltassa. BMET later today.  9.NSVT- on amio drip. K > 4 Mag 2.6  10.  Dispo- Holding on d/c with severe skin sloughing. Possible CIR down the road.   I reviewed the LVAD parameters from today, and compared the results to the patient's prior recorded data.  No programming changes were made.  The LVAD is functioning within specified parameters.  The nurse performs LVAD self-test daily.  LVAD interrogation was negative for any significant power changes, alarms or PI events/speed drops.  LVAD equipment check completed and is in good working order.  Back-up equipment present.   LVAD education done on emergency procedures and precautions and reviewed exit site care.  Length of Stay: 8  [name], NP 05/01/2017, 9:00 AM  VAD Team --- VAD ISSUES ONLY--- Pager [phone] (7am - 7am)  Advanced Heart Failure Team  Pager [phone] (M-F; 7a - 4p)  Please contact [facility] for night-coverage after hours (4p -7a ) and weekends on [email or web address]  Patient seen and examined with [name], NP. We discussed all aspects of the encounter. I agree with the assessment and plan as stated above.   He remains severely ill. Skin sloughing appears to have stopped but still with significant wounds. Discussed care with bedside nurse and WOC. Will need to follow closely.   Has completed abx for PNA. Respiratory status now stable. Wil continue to follow closely. Needs IS.   Volume status mildly elevated. Has gotten one dose of IV lasix today.   Hgb stable. No further GI bleeding.   Having runs of NSVT and atrial tach yesterday. Improved on amio.   VAD parameters stable.   Arvilla Meres, MD   3:16 PM

## 2017-05-01 NOTE — Clinical Social Work Note (Signed)
Clinical Social Work Assessment  Patient Details  Name: [name] MRN: [id number] Date of Birth: 01/04/1941  Date of referral:  05/01/17               Reason for consult:  Facility Placement                Permission sought to share information with:  Oceanographer granted to share information::  Yes, Verbal Permission Granted  Name::     Special educational needs teacher::  SNF  Relationship::  wife  Contact Information:     Housing/Transportation Living arrangements for the past 2 months:  Single Family Home Source of Information:  Patient, Spouse Patient Interpreter Needed:  None Criminal Activity/Legal Involvement Pertinent to Current Situation/Hospitalization:  No - Comment as needed Significant Relationships:  Adult Children, Spouse Lives with:  Spouse Do you feel safe going back to the place where you live?  No Need for family participation in patient care:  Yes (Comment) (wife assisting with care at home prior to admission)  Care giving concerns:  Pt lives at home with spouse- spouse was doing most of caregiving prior to admission but patient was less weak than he is now and able to assist with transfers.  Patient has two adult children but they both live out of town and would not be available for any length of time to assist.   Office manager / plan:  CSW spoke with pt and pt wife to discuss SNF if CIR unable to accept.  Patient alert and oriented but lethargic and did not participate much in conversation due to pain from wounds all over body.  CSW explained SNF vs CIR and reason for SNF back up.  Pt wife expressed understanding and had me discuss with son, [name], over the phone.  CSW informed them that there are VAD capable facilities that we can pursue the only one for sure is [facility] at this time.  Employment status:  Retired Health and safety inspector:  Medicare PT Recommendations:  Inpatient Rehab Consult Information / Referral to community  resources:  Skilled Nursing Facility  Patient/Family's Response to care:  Patient did not express an opinion on placement at this time but wife is agreeable to SNF search though is hopeful for CIR for more intensive program.  Patient/Family's Understanding of and Emotional Response to Diagnosis, Current Treatment, and Prognosis:  Pt wife seems somewhat overwhelmed by continued medical problems pt has encountered since February and his admission to [facility]- hopeful that pt can recover to return home safely with rehab.  Emotional Assessment Appearance:  Appears stated age Attitude/Demeanor/Rapport:  Lethargic, Sedated Affect (typically observed):  Appropriate, Quiet Orientation:  Oriented to Self, Oriented to Place, Oriented to  Time, Oriented to Situation Alcohol / Substance use:  Not Applicable Psych involvement (Current and /or in the community):  No (Comment)  Discharge Needs  Concerns to be addressed:  Care Coordination Readmission within the last 30 days:  No Current discharge risk:  Physical Impairment, Chronically ill Barriers to Discharge:  Continued Medical Work up   Burna Sis, LCSW 05/01/2017, 4:22 PM

## 2017-05-01 NOTE — Progress Notes (Signed)
Initial Nutrition Assessment  DOCUMENTATION CODES:   Not applicable  INTERVENTION:   Ensure Enlive po BID, each supplement provides 350 kcal and 20 grams of protein  NUTRITION DIAGNOSIS:   Inadequate oral intake related to acute illness, CHF, HCAP as evidenced by meal completion < 25%.  GOAL:   Patient will meet greater than or equal to 90% of their needs  MONITOR:   PO intake, Supplement acceptance, Labs, Weight trends  REASON FOR ASSESSMENT:   Rounds    ASSESSMENT:   76 y.o. male with CAD with PCI, ICM, afib, hx of CVA in 1987, hx of DVT/PE, HTN, hyperlipidemia, CKD III, and chronic systolic HF. Pt had [name] implant at [facility] on 04/2012.  Admitted from [facility] 04/24/2017 with black, tarry stools and Hgb 6.6. Pt s/p capsule endoscopy at [facility] in 11/2016; noted to have 1 AVM in the duodenum.    Met with pt in room today. Pt is a poor historian. Pt eating at time of RD visit. Pt reports good appetite up until one week pta. Pt currently eating 25% meals and drinking one Ensure per day. Pt also drinks one Ensure per day at home. Per chart, pt with weight gain likely secondary to edema. RD discussed the importance of adequate protein with pt. Continue to encourage intake of meals and supplements.   Medications reviewed and include: Ca carbonate, Vit D, ferrous sulfate, lasix, mirtazapine, protonix, warfarin, fentanyl  Labs reviewed: K 5.3(H), BUN 53(H), creat 1.67(H), Ca 8.0(L), Mg 2.6(H)  Wbc- 11.2(H), Hgb 8.9(L)  Nutrition-Focused physical exam completed. Findings are mild fat depletion in chest, moderate muscle depletion in temporal region, and moderate edema in BLE.   Diet Order:  Diet regular Room service appropriate? Yes; Fluid consistency: Thin  Skin:   ruptured blisters leg, buttocks, arms, and chest  Last BM:  5/29  Height:   Ht Readings from Last 1 Encounters:  04/24/17 '5\' 7"'$  (1.702 m)    Weight:   Wt Readings from Last 1 Encounters:  05/01/17 201 lb 4.8 oz  (91.3 kg)    Ideal Body Weight:  67.2 kg  BMI:  Body mass index is 31.53 kg/m.  Estimated Nutritional Needs:   Kcal:  1800-2100kcal/day   Protein:  91-109g/day   Fluid:  >1.8L/day  EDUCATION NEEDS:   Education needs addressed  Koleen Distance MS, RD, LDN Pager #[phone]

## 2017-05-01 NOTE — Plan of Care (Signed)
Problem: Education: Goal: Patient will understand all VAD equipment and how it functions Outcome: Progressing Education ongoing Goal: Patient will be able to verbalize current INR target range and antiplatelet therapy for discharge home Outcome: Progressing Able  Problem: Fluid Volume: Goal: Will show no signs and symptoms of excessive bleeding Outcome: Progressing No current s/s

## 2017-05-02 DIAGNOSIS — E43 Unspecified severe protein-calorie malnutrition: Secondary | ICD-10-CM

## 2017-05-02 LAB — BASIC METABOLIC PANEL
ANION GAP: 7 (ref 5–15)
BUN: 53 mg/dL — ABNORMAL HIGH (ref 6–20)
CALCIUM: 7.6 mg/dL — AB (ref 8.9–10.3)
CO2: 24 mmol/L (ref 22–32)
Chloride: 107 mmol/L (ref 101–111)
Creatinine, Ser: 1.78 mg/dL — ABNORMAL HIGH (ref 0.61–1.24)
GFR calc Af Amer: 40 mL/min — ABNORMAL LOW (ref >60)
GFR, EST NON AFRICAN AMERICAN: 35 mL/min — AB (ref >60)
Glucose, Bld: 124 mg/dL — ABNORMAL HIGH (ref 65–99)
POTASSIUM: 4.8 mmol/L (ref 3.5–5.1)
SODIUM: 138 mmol/L (ref 135–145)

## 2017-05-02 LAB — CBC
HCT: 28.5 % — ABNORMAL LOW (ref 39.0–52.0)
HEMOGLOBIN: 8.3 g/dL — AB (ref 13.0–17.0)
MCH: 27.4 pg (ref 26.0–34.0)
MCHC: 29.1 g/dL — AB (ref 30.0–36.0)
MCV: 94.1 fL (ref 78.0–100.0)
Platelets: 300 10*3/uL (ref 150–400)
RBC: 3.03 MIL/uL — ABNORMAL LOW (ref 4.22–5.81)
RDW: 19.9 % — AB (ref 11.5–15.5)
WBC: 8.4 10*3/uL (ref 4.0–10.5)

## 2017-05-02 LAB — COOXEMETRY PANEL
Carboxyhemoglobin: 1.6 % — ABNORMAL HIGH (ref 0.5–1.5)
METHEMOGLOBIN: 1.1 % (ref 0.0–1.5)
O2 Saturation: 74 %
TOTAL HEMOGLOBIN: 8 g/dL — AB (ref 12.0–16.0)

## 2017-05-02 LAB — PREALBUMIN: PREALBUMIN: 7.3 mg/dL — AB (ref 18–38)

## 2017-05-02 LAB — PROTIME-INR
INR: 2.19
Prothrombin Time: 24.7 seconds — ABNORMAL HIGH (ref 11.4–15.2)

## 2017-05-02 LAB — LACTATE DEHYDROGENASE: LDH: 129 U/L (ref 98–192)

## 2017-05-02 MED ORDER — FUROSEMIDE 10 MG/ML IJ SOLN
40.0000 mg | Freq: Once | INTRAMUSCULAR | Status: AC
  Administered 2017-05-02: 40 mg via INTRAVENOUS
  Filled 2017-05-02: qty 4

## 2017-05-02 MED ORDER — PANTOPRAZOLE SODIUM 40 MG PO TBEC
40.0000 mg | DELAYED_RELEASE_TABLET | Freq: Two times a day (BID) | ORAL | Status: DC
  Administered 2017-05-02 – 2017-05-11 (×18): 40 mg via ORAL
  Filled 2017-05-02 (×19): qty 1

## 2017-05-02 MED ORDER — WARFARIN SODIUM 2 MG PO TABS
1.0000 mg | ORAL_TABLET | Freq: Once | ORAL | Status: AC
  Administered 2017-05-02: 1 mg via ORAL
  Filled 2017-05-02 (×2): qty 0.5

## 2017-05-02 NOTE — NC FL2 (Signed)
[place] MEDICAID FL2 LEVEL OF CARE SCREENING TOOL     IDENTIFICATION  Patient Name: [name] Birthdate: 1941/08/01 Sex: male Admission Date (Current Location): 04/04/2017  [facility] and [place] Number:  [employer] and Address:  [facility] [place], [address] [place], [place] [address]      Provider Number: [id number]  Attending Physician Name and Address:  Dolores Patty, MD  Relative Name and Phone Number:       Current Level of Care: Hospital Recommended Level of Care: Skilled Nursing Facility Prior Approval Number:    Date Approved/Denied:   PASRR Number:   [id number]   Discharge Plan: SNF    Current Diagnoses: Patient Active Problem List   Diagnosis Date Noted  . Healthcare-associated pneumonia   . Chronic kidney disease (CKD), stage IV (severe) (HCC)   . Tachycardia   . Tachypnea   . Symptomatic anemia 04/11/2017  . Iron deficiency anemia due to chronic blood loss 02/11/2017  . Malabsorption of iron 02/11/2017  . Anemia of chronic renal failure, stage 4 (severe) (HCC) 02/11/2017  . Mass of pancreas 09/01/2015  . Falls 08/17/2015  . LVAD (left ventricular assist device) present (HCC) 09/15/2014  . Fatigue 03/18/2014  . GI bleed 07/14/2013  . Implantable cardiac defibrillator lead failure 11/20/2012  . ICD-Medtronic 11/13/2012  . Gout 11/12/2012  . Acquired von Willebrand disease (HCC) 05/27/2012  . Mitral regurgitation 11/20/2011  . Renal insufficiency 11/20/2011  . Edema 08/28/2011  . Peripheral neuropathy 08/28/2011  . Long term current use of anticoagulant therapy 02/23/2011  . SHORTNESS OF BREATH 12/21/2010  . POSTSURG PERCUT TRANSLUMINAL COR ANGPLSTY STS 12/21/2010  . CHRONIC SYSTOLIC HEART FAILURE 11/24/2010  . ATRIAL FIBRILLATION 10/13/2010  . DM 10/11/2009  . OBSTRUCTIVE SLEEP APNEA 10/11/2009  . CORONARY ARTERY DISEASE, S/P PTCA 10/11/2009  . DVT 10/11/2009  . PULMONARY NODULE 10/11/2009  .  MESENTERIC VENOUS THROMBOSIS 10/11/2009  . OTH SPEC D/O RESULT FROM IMPAIRED RENAL FUNCTION 10/11/2009  . PULMONARY EMBOLISM, HX OF 10/11/2009  . HYPERLIPIDEMIA-MIXED 08/16/2009  . CAD, NATIVE VESSEL 08/16/2009  . HYPOTENSION, ORTHOSTATIC 08/16/2009    Orientation RESPIRATION BLADDER Height & Weight     Self, Time, Situation, Place  Normal Continent Weight: 202 lb 13.2 oz (92 kg) Height:  5\' 7"  (170.2 cm)  BEHAVIORAL SYMPTOMS/MOOD NEUROLOGICAL BOWEL NUTRITION STATUS      Continent Diet (cardica)  AMBULATORY STATUS COMMUNICATION OF NEEDS Skin   Extensive Assist Verbally Other (Comment) (incision on leg needing foam dressing q3 days, blisters on buttocks and chest from vancomycin reaction requiring impregnanted gauze dressing twice a day)                       Personal Care Assistance Level of Assistance  Bathing, Dressing Bathing Assistance: Maximum assistance   Dressing Assistance: Maximum assistance     Functional Limitations Info             SPECIAL CARE FACTORS FREQUENCY  PT (By licensed PT), OT (By licensed OT)     PT Frequency: 5/wk OT Frequency: 5/wk            Contractures      Additional Factors Info  Code Status, Allergies, Psychotropic Code Status Info: Partial Allergies Info: Amoxicillin-pot Clavulanate, Vancomycin, Levofloxacin, Oxycodone, Ace Inhibitors, Ceftriaxone, Cefuroxime, Chlorhexidine Gluconate, Clindamycin/lincomycin, Hydralazine, Meropenem, Sulfonamide Derivatives, Zosyn Piperacillin Sod-tazobactam So Psychotropic Info: celexa         Current Medications (05/02/2017):  This is the current hospital active medication list Current Facility-Administered Medications  Medication Dose Route Frequency Provider Last Rate Last Dose  . 0.9 %  sodium chloride infusion   Intravenous Once Bensimhon, Bevelyn Buckles, MD      . acetaminophen (TYLENOL) tablet 650 mg  650 mg Oral Q4H PRN Graciella Freer, PA-C   650 mg at 04/30/17 1054  . albuterol  (PROVENTIL) (2.5 MG/3ML) 0.083% nebulizer solution 3 mL  3 mL Inhalation Q6H PRN Graciella Freer, PA-C      . amiodarone (NEXTERONE PREMIX) 360-4.14 MG/200ML-% (1.8 mg/mL) IV infusion  30 mg/hr Intravenous Continuous Clegg, Amy D, NP 16.7 mL/hr at 05/02/17 0035 30 mg/hr at 05/02/17 0035  . calcium carbonate (OS-CAL - dosed in mg of elemental calcium) tablet 500 mg of elemental calcium  1 tablet Oral BID WC Bensimhon, Bevelyn Buckles, MD   500 mg of elemental calcium at 05/02/17 0851  . cholecalciferol (VITAMIN D) tablet 1,000 Units  1,000 Units Oral Daily Bensimhon, Bevelyn Buckles, MD   1,000 Units at 05/02/17 [phone]  . citalopram (CELEXA) tablet 10 mg  10 mg Oral Daily Bensimhon, Bevelyn Buckles, MD   10 mg at 05/02/17 0941  . diphenhydrAMINE (BENADRYL) capsule 25 mg  25 mg Oral Q6H PRN Graciella Freer, PA-C   25 mg at 04/30/17 0957  . febuxostat (ULORIC) tablet 40 mg  40 mg Oral Daily Graciella Freer, PA-C   40 mg at 05/02/17 0941  . feeding supplement (ENSURE ENLIVE) (ENSURE ENLIVE) liquid 237 mL  237 mL Oral BID BM Bensimhon, Bevelyn Buckles, MD   237 mL at 05/02/17 1000  . fentaNYL (SUBLIMAZE) injection 12.5 mcg  12.5 mcg Intravenous Q3H PRN Bensimhon, Bevelyn Buckles, MD   12.5 mcg at 05/02/17 9604  . ferrous sulfate tablet 325 mg  325 mg Oral Q breakfast Graciella Freer, PA-C   325 mg at 05/02/17 5409  . hydroxychloroquine (PLAQUENIL) tablet 400 mg  400 mg Oral QPC breakfast Graciella Freer, PA-C   400 mg at 05/02/17 0941  . hydrOXYzine (ATARAX/VISTARIL) tablet 25 mg  25 mg Oral Q6H PRN Quintella Reichert, MD   25 mg at 04/30/17 1717  . MEDLINE mouth rinse  15 mL Mouth Rinse BID Bensimhon, Bevelyn Buckles, MD   15 mL at 05/01/17 2237  . Melatonin TABS 3 mg  3 mg Oral QHS PRN Bensimhon, Bevelyn Buckles, MD      . mirtazapine (REMERON) tablet 7.5 mg  7.5 mg Oral QHS Graciella Freer, PA-C   7.5 mg at 05/01/17 2236  . mometasone-formoterol (DULERA) 200-5 MCG/ACT inhaler 2 puff  2 puff Inhalation BID  Graciella Freer, PA-C   2 puff at 05/02/17 0845  . norepinephrine (LEVOPHED) 4 mg in dextrose 5 % 250 mL (0.016 mg/mL) infusion  0-40 mcg/min Intravenous Titrated Bensimhon, Bevelyn Buckles, MD   Stopped at 04/26/17 1855  . ondansetron (ZOFRAN) injection 4 mg  4 mg Intravenous Q6H PRN Graciella Freer, PA-C      . pantoprazole (PROTONIX) EC tablet 40 mg  40 mg Oral BID Clegg, Amy D, NP      . pravastatin (PRAVACHOL) tablet 40 mg  40 mg Oral Daily Graciella Freer, PA-C   40 mg at 05/02/17 0941  . sodium chloride flush (NS) 0.9 % injection 10-40 mL  10-40 mL Intracatheter Q12H Bensimhon, Bevelyn Buckles, MD   10 mL at 05/01/17 2236  . sodium chloride flush (NS) 0.9 % injection 10-40  mL  10-40 mL Intracatheter PRN Bensimhon, Bevelyn Buckles, MD      . tamsulosin [facility]) capsule 0.4 mg  0.4 mg Oral Daily Graciella Freer, PA-C   0.4 mg at 05/02/17 0941  . vasopressin (PITRESSIN) 40 Units in sodium chloride 0.9 % 250 mL (0.16 Units/mL) infusion  0.03 Units/min Intravenous Continuous Bensimhon, Bevelyn Buckles, MD   Stopped at 04/27/17 1230  . warfarin (COUMADIN) tablet 1 mg  1 mg Oral ONCE-1800 Mahala Menghini, [facility]      . Warfarin - Pharmacist Dosing Inpatient   Does not apply q1800 Carney, Gwenlyn Found, [facility]         Discharge Medications: Please see discharge summary for a list of discharge medications.  Relevant Imaging Results:  Relevant Lab Results:   Additional Information SS#: [id number], patient has LVAD- will need LVAD capable SNF  Elsbeth Yearick, Wyn Quaker, LCSW

## 2017-05-02 NOTE — Progress Notes (Signed)
ANTICOAGULATION CONSULT NOTE   Pharmacy Consult for warfarin Indication: LVAD  Allergies  Allergen Reactions  . Amoxicillin-Pot Clavulanate Rash  . Vancomycin Hives    Red rash with blisters/sloughing  . Levofloxacin Rash    Doesn't tolerate  . Oxycodone Hives  . Ace Inhibitors Rash  . Ceftriaxone Rash  . Cefuroxime Rash  . Chlorhexidine Gluconate Rash    H/o recurrent cellulitis and rash   . Clindamycin/Lincomycin Rash    Erythematous drug eruption  . Hydralazine Itching  . Meropenem Rash  . Sulfonamide Derivatives Rash    Mother was highly allergic. Patient untested.   [name] Zosyn [Piperacillin Sod-Tazobactam So] Rash    No other systemic symptoms    Patient Measurements: Height: 5\' 7"  (170.2 cm) Weight: 202 lb 13.2 oz (92 kg) IBW/kg (Calculated) : 66.1 Heparin Dosing Weight: n/a  Vital Signs: Temp: 97.9 F (36.6 C) (05/31 0000) Temp Source: Oral (05/31 0403) Pulse Rate: 76 (05/31 0600)  Labs:  Recent Labs  04/30/17 0427  05/01/17 0354 05/01/17 1208 05/02/17 0500  HGB 8.9*  --  8.9*  --   --   HCT 29.9*  --  29.7*  --   --   PLT 286  --  305  --   --   LABPROT 20.8*  --  21.1*  --  24.7*  INR 1.77  --  1.79  --  2.19  CREATININE 1.51*  < > 1.67* 1.71* 1.78*  < > = values in this interval not displayed.  Estimated Creatinine Clearance: 37 mL/min (A) (by C-G formula based on SCr of 1.78 mg/dL (H)).   Medical History: Past Medical History:  Diagnosis Date  . Allergy   . Anemia   . Anemia of chronic renal failure, stage 4 (severe) (HCC) 02/11/2017  . Angina   . Anxiety   . Blood transfusion   . CAD (coronary artery disease)   . Cataract   . CHF (congestive heart failure) (HCC)   . Clotting disorder (HCC)   . COPD (chronic obstructive pulmonary disease) (HCC)   . Diabetes mellitus   . DVT of lower extremity (deep venous thrombosis) (HCC)    left  . Gout   . H/O hiatal hernia   . Heart murmur   . High cholesterol   . Hypertension   . Hypotension     ORTHOSTATIC  . Iron deficiency anemia due to chronic blood loss 02/11/2017  . Jaundice    "@ birth & when I was in 7th grade"  . Malabsorption of iron 02/11/2017  . Mitral valve disorder   . Myocardial infarction (HCC) 1988  . Neuromuscular disorder (HCC)   . Osteoporosis   . Pneumonia 1993; 2009   "both serious cases"  . Pneumonia 2010   "not hospitalized"  . Pulmonary embolism on left (HCC) 1988  . 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 Shortness of breath on exertion   . Sleep apnea   . Stomach ulcer 1987-1988  . 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 calcium carbonate  1 tablet Oral BID WC  . cholecalciferol  1,000 Units Oral Daily  . citalopram  10 mg Oral Daily  . febuxostat  40 mg Oral Daily  . feeding supplement (ENSURE ENLIVE)  237 mL Oral BID BM  . ferrous sulfate  325 mg Oral Q breakfast  . hydroxychloroquine  400 mg Oral  QPC breakfast  . mouth rinse  15 mL Mouth Rinse BID  . mirtazapine  7.5 mg Oral QHS  . mometasone-formoterol  2 puff Inhalation BID  . pantoprazole (PROTONIX) IV  40 mg Intravenous Q12H  . pravastatin  40 mg Oral Daily  . sodium chloride flush  10-40 mL Intracatheter Q12H  . tamsulosin  0.4 mg Oral Daily  . Warfarin - Pharmacist Dosing Inpatient   Does not apply q1800    Assessment: 76 yo male with LVAD on chronic warfarin.  Admitted with supratherapeutic INR of 4.12 and GIB. Warfarin was held 5/22-5/27 and restarted 5/28.   INR remains therapeutic at 2.19, a small increase from yesterday. No CBC today and no further bleeding noted. Of note, antibiotics were stopped today due to sloughing rash. Patient is not eating much, has Ensure shakes, albumin and prealbumin low.  5/29 - amio started (not on PTA)  PTA Warfarin dose 2 mg daily.  Per patient report was recently decreased from 2 mg daily except 3 mg on MWF. No aspirin.  Goal of Therapy:  INR  1.7-2.3 (per [facility]) Monitor platelets by anticoagulation protocol: Yes   Plan:  Warfarin 1mg  x1 tonight. Daily PT/INR/LDH  Carylon Perches, PharmD Acute Care Pharmacy Resident  Pager: [phone] 05/02/2017

## 2017-05-02 NOTE — Progress Notes (Signed)
Patient ID: [name], male   DOB: [birth year], 76 y.o.   MRN: [id number]   Advanced Heart Failure VAD Team Note  Subjective:    Admitted from [facility] 04/05/2017 with black, tarry stools and Hgb 6.6.  Septic/PNC- antibiotics stopped on 5/29.    Poor appetite. Denies SOB.   LVAD INTERROGATION:  HeartMate II LVAD:  Flow 6 liters/min, speed 9400, power 6, PI 4.5    Objective:    Vital Signs:   Temp:  [97.9 F (36.6 C)-98.9 F (37.2 C)] 97.9 F (36.6 C) (05/31 0000) Pulse Rate:  [59-124] 76 (05/31 0600) Resp:  [8-24] 9 (05/31 0600) SpO2:  [89 %-99 %] 98 % (05/31 0600) Weight:  [202 lb 13.2 oz (92 kg)] 202 lb 13.2 oz (92 kg) (05/31 0600) Last BM Date: 05/01/17 Mean arterial Pressure 70-80s    Intake/Output:   Intake/Output Summary (Last 24 hours) at 05/02/17 0820 Last data filed at 05/02/17 0600  Gross per 24 hour  Intake            907.4 ml  Output              930 ml  Net            -22.6 ml    Physical Exam: CVP 9-10 GENERAL: Appears fatigued. In bed. NAD.  HEENT: normal  NECK: Supple, JVP 8-9  .  2+ bilaterally, no bruits.  No lymphadenopathy or thyromegaly appreciated.   CARDIAC:  Mechanical heart sounds with LVAD hum present.  LUNGS:  Clear to auscultation bilaterally.  ABDOMEN:  Soft, round, nontender, positive bowel sounds x4.     LVAD exit site: well-healed and incorporated.  Dressing dry and intact.  No erythema or drainage.  Stabilization device present and accurately applied.  Driveline dressing is being changed daily per sterile technique. No driveline infection EXTREMITIES:  Warm and dry, no cyanosis, clubbing, rash mild thigh edema NEUROLOGIC:  Alert and oriented x 4.  Gait steady.  No aphasia.  No dysarthria.  Affect pleasant.    Skin : I personally assessed his skin. Seems be improving. Partial thickness loss buttocks, penis, and extremities.    Telemetry: Chronic A fib with PVCs.  Personally reviewed  Labs: Basic Metabolic Panel:  Recent  Labs Lab 04/26/17 0852  04/30/17 0427 04/30/17 1612 05/01/17 0354 05/01/17 1208 05/02/17 0500  NA  --   < > 139 136 136 137 138  K  --   < > 5.0 5.1 5.3* 5.0 4.8  CL  --   < > 110 109 108 108 107  CO2  --   < > 23 22 23 23 24   GLUCOSE  --   < > 92 101* 94 129* 124*  BUN  --   < > 50* 50* 53* 51* 53*  CREATININE  --   < > 1.51* 1.55* 1.67* 1.71* 1.78*  CALCIUM  --   < > 8.0* 8.1* 8.0* 7.9* 7.6*  MG 1.7  --   --  2.1 2.6*  --   --   < > = values in this interval not displayed.  Liver Function Tests:  Recent Labs Lab 05/01/17 1208  AST 20  ALT 17  ALKPHOS 105  BILITOT 0.6  PROT 4.6*  ALBUMIN 1.6*   No results for input(s): LIPASE, AMYLASE in the last 168 hours. No results for input(s): AMMONIA in the last 168 hours.  CBC:  Recent Labs Lab 04/27/17 0435 04/28/17 0430 04/29/17 0400 04/30/17 0427 05/01/17  0354  WBC 13.4* 11.2* 10.9* 11.2* 11.2*  NEUTROABS  --   --   --   --  9.0*  HGB 9.5* 9.2* 9.1* 8.9* 8.9*  HCT 30.9* 31.0* 30.5* 29.9* 29.7*  MCV 90.4 92.0 92.1 93.4 94.3  PLT 219 237 266 286 305    INR:  Recent Labs Lab 04/28/17 0430 04/29/17 0400 04/30/17 0427 05/01/17 0354 05/02/17 0500  INR 2.57 2.24 1.77 1.79 2.19    Other results:   Imaging: No results found.   Medications:     Scheduled Medications: . calcium carbonate  1 tablet Oral BID WC  . cholecalciferol  1,000 Units Oral Daily  . citalopram  10 mg Oral Daily  . febuxostat  40 mg Oral Daily  . feeding supplement (ENSURE ENLIVE)  237 mL Oral BID BM  . ferrous sulfate  325 mg Oral Q breakfast  . hydroxychloroquine  400 mg Oral QPC breakfast  . mouth rinse  15 mL Mouth Rinse BID  . mirtazapine  7.5 mg Oral QHS  . mometasone-formoterol  2 puff Inhalation BID  . pantoprazole (PROTONIX) IV  40 mg Intravenous Q12H  . pravastatin  40 mg Oral Daily  . sodium chloride flush  10-40 mL Intracatheter Q12H  . tamsulosin  0.4 mg Oral Daily  . Warfarin - Pharmacist Dosing Inpatient   Does  not apply q1800    Infusions: . sodium chloride    . amiodarone 30 mg/hr (05/02/17 0035)  . norepinephrine (LEVOPHED) Adult infusion Stopped (04/26/17 1855)  . vasopressin (PITRESSIN) infusion - *FOR SHOCK* Stopped (04/27/17 1230)    PRN Medications: acetaminophen, albuterol, diphenhydrAMINE, fentaNYL (SUBLIMAZE) injection, hydrOXYzine, Melatonin, ondansetron (ZOFRAN) IV, sodium chloride flush   Assessment/Plan   1. 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 Noted to have functional SBO with 3 retained capsule cameras.  Has had multiple GI work-ups at [facility]. Would not repeat scope unless rebleeds. INR 1.7. No signs of bleeding.  -- Has had multiple transfusions this admission. CBC pending.   -- Continue coumadin. d/w PharmD 2. Acute on chronic systolic CHF due to ICM s/p LVAD 04/2012 at [facility] for DT.   CVP 8-9 . Give one dose IV lasix. 40 mg IV x1. Renal function ok.   3. Chronic anticoagulation for LVAD/afib:  --Goal INR 1.7-2.3, no ASA. INR 2.19. [name]  Pharm D dosing.  4. Chronic Afib:  --INR 2.19. Discussed with Pharm D. .    5. Shock Septic with productive cough: suspect HCAP.  CXR with L basilar airspace opacity.  PCT falling,  Off pressors.  CBC pending.   -Maps stable 70-80s . -- Antibiotics stopped 5/29. 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 He has had 7 days of antibiotics.  6. AKI on CKD IV:  Creatinine 1.78. BMET in am   7. 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 Extremities, penis, buttocks partial thickness skin loss.  Vanc stopped 5/29 . Seems to be improving. No skin loss.   Continue silicone dressing to extremities would not remove for 5 days.  Continue xeroform to buttock and penis wounds.  Continue low air loss overload and reposition R to L only.  8. Depression -- He has multiple somatic complaints and appears  to have little hope. Continue Celexa. 9. Hyperkalemia - K down to 4.8   9.NSVT- on amio  drip. K 4.8  10. Severe Malnutrition- Dietitians following. Prealbumin 7.3. Continue regular diet. Encouraged to eat protein.  10.  Dispo- Holding on d/c with severe skin sloughing. Possible CIR down the road.   I reviewed the LVAD parameters from today, and compared the results to the patient's prior recorded data.  No programming changes were made.  The LVAD is functioning within specified parameters.  The nurse performs LVAD self-test daily.  LVAD interrogation was negative for any significant power changes, alarms or PI events/speed drops.  LVAD equipment check completed and is in good working order.  Back-up equipment present.   LVAD education done on emergency procedures and precautions and reviewed exit site care.  Length of Stay: 9  [name], NP 05/02/2017, 8:20 AM  VAD Team --- VAD ISSUES ONLY--- Pager [phone] (7am - 7am)  Advanced Heart Failure Team  Pager [phone] (M-F; 7a - 4p)  Please contact [facility] for night-coverage after hours (4p -7a ) and weekends on [email or web address]  Patient seen and examined with [name], NP. We discussed all aspects of the encounter. I agree with the assessment and plan as stated above.   Continues to struggle with severe malnutrition with pre-albumin < 9. Skin wounds somewhat improved but still tenuous. We redressed the wounds personally on rounds. Encourage protein intake.   Volume status remains elevated. One dose of lasix today.   VAD parameters stable.   Septic shock/HCAP resolved with abx. Will provide incentive spirometer.  Ectopy improving on amio. Will continue. Supp lytes as needed.   Overall making very little progress. Low threshold to involve palliative care given multiple medical issues and severe downward trajectory over past few months.   Arvilla Meres, MD  9:07 AM

## 2017-05-02 NOTE — Progress Notes (Signed)
Admitted 04/07/2017 due to suspected GIB due to black, tarry stools.   HeartMate II LVAD implanted on 04/2012 by [facility].   Vital signs: HR: 79 Doppler Pressure: 86 Automatic BP:  Not done O2 Sat: 97 on RA Wt:88.1 kg  >88.9 > 90kg >91> 92Kg   LVAD interrogation reveals:  Speed: 9400 Flow: 6.3 Power:  6.4w PI: 4.8 Alarms: none Events:  30 PI events 05/01/17 Fixed speed: 9400 Low speed limit: 8800  Drive Line: last changed 1/24 will be due 6/3.  Gtts:  Amiodarone 30 mg/hr - started 04/30/17   Labs:  LDH trend:143>137>200>131>139>129  INR trend: 4.12>3.94>3.99>1.77>1.79>2.19  Anticoagulation Plan: -INR Goal: 2-3 -ASA Dose: none due to  GIB history  Adverse Events on VAD: -07/2013>GIB  Plan/Recommendations:   1. Weekly dressing changes.     Carlton Adam RN, VAD Coordinator 24/7 pager [phone]

## 2017-05-03 ENCOUNTER — Ambulatory Visit: Payer: Medicare Other

## 2017-05-03 ENCOUNTER — Other Ambulatory Visit: Payer: Medicare Other

## 2017-05-03 ENCOUNTER — Ambulatory Visit: Payer: Medicare Other | Admitting: Family

## 2017-05-03 LAB — BASIC METABOLIC PANEL
Anion gap: 6 (ref 5–15)
BUN: 55 mg/dL — ABNORMAL HIGH (ref 6–20)
CHLORIDE: 106 mmol/L (ref 101–111)
CO2: 25 mmol/L (ref 22–32)
CREATININE: 1.78 mg/dL — AB (ref 0.61–1.24)
Calcium: 7.5 mg/dL — ABNORMAL LOW (ref 8.9–10.3)
GFR calc Af Amer: 40 mL/min — ABNORMAL LOW (ref >60)
GFR calc non Af Amer: 35 mL/min — ABNORMAL LOW (ref >60)
GLUCOSE: 129 mg/dL — AB (ref 65–99)
Potassium: 4.7 mmol/L (ref 3.5–5.1)
Sodium: 137 mmol/L (ref 135–145)

## 2017-05-03 LAB — CBC
HCT: 27.1 % — ABNORMAL LOW (ref 39.0–52.0)
HEMOGLOBIN: 7.9 g/dL — AB (ref 13.0–17.0)
MCH: 27.4 pg (ref 26.0–34.0)
MCHC: 29.2 g/dL — AB (ref 30.0–36.0)
MCV: 94.1 fL (ref 78.0–100.0)
PLATELETS: 300 10*3/uL (ref 150–400)
RBC: 2.88 MIL/uL — AB (ref 4.22–5.81)
RDW: 19.5 % — ABNORMAL HIGH (ref 11.5–15.5)
WBC: 7.4 10*3/uL (ref 4.0–10.5)

## 2017-05-03 LAB — PROTIME-INR
INR: 2.27
PROTHROMBIN TIME: 25.4 s — AB (ref 11.4–15.2)

## 2017-05-03 LAB — LACTATE DEHYDROGENASE: LDH: 129 U/L (ref 98–192)

## 2017-05-03 LAB — COOXEMETRY PANEL
CARBOXYHEMOGLOBIN: 1.4 % (ref 0.5–1.5)
Methemoglobin: 0.9 % (ref 0.0–1.5)
O2 SAT: 53.2 %
TOTAL HEMOGLOBIN: 13 g/dL (ref 12.0–16.0)

## 2017-05-03 LAB — PREPARE RBC (CROSSMATCH)

## 2017-05-03 MED ORDER — WARFARIN SODIUM 2 MG PO TABS
2.0000 mg | ORAL_TABLET | Freq: Once | ORAL | Status: AC
  Administered 2017-05-03: 2 mg via ORAL
  Filled 2017-05-03: qty 1

## 2017-05-03 MED ORDER — SODIUM CHLORIDE 0.9 % IV SOLN
Freq: Once | INTRAVENOUS | Status: AC
  Administered 2017-05-03: 10 mL/h via INTRAVENOUS

## 2017-05-03 NOTE — Progress Notes (Signed)
ANTICOAGULATION CONSULT NOTE   Pharmacy Consult for warfarin Indication: LVAD  Allergies  Allergen Reactions  . Amoxicillin-Pot Clavulanate Rash  . Vancomycin Hives    Red rash with blisters/sloughing  . Levofloxacin Rash    Doesn't tolerate  . Oxycodone Hives  . Ace Inhibitors Rash  . Ceftriaxone Rash  . Cefuroxime Rash  . Chlorhexidine Gluconate Rash    H/o recurrent cellulitis and rash   . Clindamycin/Lincomycin Rash    Erythematous drug eruption  . Hydralazine Itching  . Meropenem Rash  . Sulfonamide Derivatives Rash    Mother was highly allergic. Patient untested.   [name] Zosyn [Piperacillin Sod-Tazobactam So] Rash    No other systemic symptoms    Patient Measurements: Height: 5\' 7"  (170.2 cm) Weight: 205 lb (93 kg) IBW/kg (Calculated) : 66.1 Heparin Dosing Weight: n/a  Vital Signs: Temp: 98.7 F (37.1 C) (06/01 0803) Temp Source: Axillary (06/01 0803) Pulse Rate: 60 (06/01 0400)  Labs:  Recent Labs  05/01/17 0354 05/01/17 1208 05/02/17 0500 05/02/17 0855 05/03/17 0438  HGB 8.9*  --   --  8.3* 7.9*  HCT 29.7*  --   --  28.5* 27.1*  PLT 305  --   --  300 300  LABPROT 21.1*  --  24.7*  --  25.4*  INR 1.79  --  2.19  --  2.27  CREATININE 1.67* 1.71* 1.78*  --  1.78*    Estimated Creatinine Clearance: 37.2 mL/min (A) (by C-G formula based on SCr of 1.78 mg/dL (H)).   Medical History: Past Medical History:  Diagnosis Date  . Allergy   . Anemia   . Anemia of chronic renal failure, stage 4 (severe) (HCC) 02/11/2017  . Angina   . Anxiety   . Blood transfusion   . CAD (coronary artery disease)   . Cataract   . CHF (congestive heart failure) (HCC)   . Clotting disorder (HCC)   . COPD (chronic obstructive pulmonary disease) (HCC)   . Diabetes mellitus   . DVT of lower extremity (deep venous thrombosis) (HCC)    left  . Gout   . H/O hiatal hernia   . Heart murmur   . High cholesterol   . Hypertension   . Hypotension    ORTHOSTATIC  . Iron  deficiency anemia due to chronic blood loss 02/11/2017  . Jaundice    "@ birth & when I was in 7th grade"  . Malabsorption of iron 02/11/2017  . Mitral valve disorder   . Myocardial infarction (HCC) 1988  . Neuromuscular disorder (HCC)   . Osteoporosis   . Pneumonia 1993; 2009   "both serious cases"  . Pneumonia 2010   "not hospitalized"  . Pulmonary embolism on left (HCC) 1988  . 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 Shortness of breath on exertion   . Sleep apnea   . Stomach ulcer 1987-1988  . 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 calcium carbonate  1 tablet Oral BID WC  . cholecalciferol  1,000 Units Oral Daily  . citalopram  10 mg Oral Daily  . febuxostat  40 mg Oral Daily  . feeding supplement (ENSURE ENLIVE)  237 mL Oral BID BM  . ferrous sulfate  325 mg Oral Q breakfast  . hydroxychloroquine  400 mg Oral QPC breakfast  . mouth rinse  15 mL Mouth Rinse BID  . mirtazapine  7.5 mg Oral QHS  . mometasone-formoterol  2 puff Inhalation BID  . pantoprazole  40 mg Oral BID  . pravastatin  40 mg Oral Daily  . sodium chloride flush  10-40 mL Intracatheter Q12H  . tamsulosin  0.4 mg Oral Daily  . warfarin  2 mg Oral ONCE-1800  . Warfarin - Pharmacist Dosing Inpatient   Does not apply q1800    Assessment: 76 yo male with LVAD on chronic warfarin.  Admitted with supratherapeutic INR of 4.12 and GIB. Warfarin was held 5/22-5/27 and restarted 5/28.   INR remains therapeutic at 2.27, LDH stable 129.  Watch closely as amiodarone started this week for NSVT/Afib RVR but also ensure BID (which contains vit k) d/t poor oral intake and low prealbumin. Slight oozing from wounds and h/h slightly lower 7.9/27 - planning 1 ut prbc today.  PLCT stable.     5/29 - amio started (not on PTA)  PTA Warfarin dose 2 mg daily.  Per patient report was recently decreased from 2 mg daily except 3 mg on MWF. No  aspirin.  Goal of Therapy:  INR 1.7-2.3 (per [facility]) Monitor platelets by anticoagulation protocol: Yes   Plan:  Warfarin 2mg  x1 tonight. Daily PT/INR/LDH   Leota Sauers Pharm.D. CPP, BCPS Clinical Pharmacist [phone] 05/03/2017 11:32 AM

## 2017-05-03 NOTE — Progress Notes (Signed)
Admitted 04/22/2017 due to suspected GIB due to black, tarry stools.   HeartMate II LVAD implanted on 04/2012 by [facility].   Vital signs: HR: 77 Doppler Pressure: 80 Automatic BP:  Not done O2 Sat: 92 on RA Wt:88.1 kg  >88.9 > 90kg >91> 92>93kg   LVAD interrogation reveals:  Speed: 9400 Flow: 6.5 Power:  6.5w PI: 3.7 Alarms: none Events:  7 PI events 05/02/17 Fixed speed: 9200 Low speed limit: 8600  Drive Line: last changed 0/04 will be due 6/3.  Gtts:  Amiodarone 30 mg/hr - started 04/30/17  Blood products: 1 PRBC today-05-03-17   Labs:  LDH trend:143>137>200>131>139>129>129  INR trend: 4.12>3.94>3.99>1.77>1.79>2.19>2.27  Anticoagulation Plan: -INR Goal: 2-3 -ASA Dose: none due to  GIB history  Adverse Events on VAD: -07/2013>GIB  Plan/Recommendations:   1. Weekly dressing changes. 2. Pt will receive a unit of Blood today.    Carlton Adam RN, VAD Coordinator 24/7 pager [phone]

## 2017-05-03 NOTE — Plan of Care (Signed)
Problem: Skin Integrity: Goal: Risk for impaired skin integrity will decrease Outcome: Progressing Skin breakdown is improving  Problem: Nutrition: Goal: Adequate nutrition will be maintained Outcome: Not Progressing Patient has poor appetite

## 2017-05-03 NOTE — Progress Notes (Signed)
Patient ID: [name], male   DOB: June 20, 1941, 76 y.o.   MRN: [id number]   Advanced Heart Failure VAD Team Note  Subjective:    Admitted from [facility] 04/05/2017 with black, tarry stools and Hgb 6.6.  Septic/PNC- antibiotics stopped on 5/29.   Hgb down to 7.9. Having areas oozing on his L buttock.    Complaining of buttock pain when he is repositioned.   LVAD INTERROGATION:  HeartMate II LVAD:  Flow 6.2 liters/min, speed 9400, power 6.5 PI 4.1    5 PI events over night.    Objective:    Vital Signs:   Temp:  [97.1 F (36.2 C)-98.7 F (37.1 C)] 98.7 F (37.1 C) (06/01 0803) Pulse Rate:  [60-81] 60 (06/01 0400) Resp:  [8-20] 15 (06/01 0400) SpO2:  [91 %-99 %] 91 % (06/01 0403) Weight:  [205 lb (93 kg)] 205 lb (93 kg) (06/01 0403) Last BM Date: 05/02/17 Mean arterial Pressure 78-84 Intake/Output:   Intake/Output Summary (Last 24 hours) at 05/03/17 0816 Last data filed at 05/03/17 0000  Gross per 24 hour  Intake           1184.2 ml  Output              875 ml  Net            309.2 ml    Physical Exam: CVP 5 personally checked.  GENERAL: Elderly appears fatigued. In bed. NAD  HEENT: normal  NECK: Supple, JVP  6-7.  2+ bilaterally, no bruits.  No lymphadenopathy or thyromegaly appreciated.   CARDIAC:  Mechanical heart sounds with LVAD hum present.  LUNGS:  Decreased in the bases on 2 liter Clear to auscultation bilaterally.  ABDOMEN:  Soft, round, nontender, positive bowel sounds x4.     LVAD exit site: well-healed and incorporated.  Dressing dry and intact.  No erythema or drainage.  Stabilization device present and accurately applied.   EXTREMITIES:  Warm and dry, no cyanosis, clubbing, rash or edema  NEUROLOGIC:  Alert and oriented x 4.  Gait steady.  No aphasia.  No dysarthria.  Affect pleasant.    Skin: Multiple areas of partial thickness skin loss on trunk, extremities, buttocks, and penis. Reapplied xerofrom to buttocks, interdry to groin, and silicone dressing  remains on trunk and legs. Doses appear to be improving.    Telemetry: A Fib 70s with PVCs.   Labs: Basic Metabolic Panel:  Recent Labs Lab 04/26/17 0852  04/30/17 1612 05/01/17 0354 05/01/17 1208 05/02/17 0500 05/03/17 0438  NA  --   < > 136 136 137 138 137  K  --   < > 5.1 5.3* 5.0 4.8 4.7  CL  --   < > 109 108 108 107 106  CO2  --   < > 22 23 23 24 25   GLUCOSE  --   < > 101* 94 129* 124* 129*  BUN  --   < > 50* 53* 51* 53* 55*  CREATININE  --   < > 1.55* 1.67* 1.71* 1.78* 1.78*  CALCIUM  --   < > 8.1* 8.0* 7.9* 7.6* 7.5*  MG 1.7  --  2.1 2.6*  --   --   --   < > = values in this interval not displayed.  Liver Function Tests:  Recent Labs Lab 05/01/17 1208  AST 20  ALT 17  ALKPHOS 105  BILITOT 0.6  PROT 4.6*  ALBUMIN 1.6*   No results for input(s): LIPASE,  AMYLASE in the last 168 hours. No results for input(s): AMMONIA in the last 168 hours.  CBC:  Recent Labs Lab 04/29/17 0400 04/30/17 0427 05/01/17 0354 05/02/17 0855 05/03/17 0438  WBC 10.9* 11.2* 11.2* 8.4 7.4  NEUTROABS  --   --  9.0*  --   --   HGB 9.1* 8.9* 8.9* 8.3* 7.9*  HCT 30.5* 29.9* 29.7* 28.5* 27.1*  MCV 92.1 93.4 94.3 94.1 94.1  PLT 266 286 305 300 300    INR:  Recent Labs Lab 04/29/17 0400 04/30/17 0427 05/01/17 0354 05/02/17 0500 05/03/17 0438  INR 2.24 1.77 1.79 2.19 2.27    Other results:   Imaging: No results found.   Medications:     Scheduled Medications: . calcium carbonate  1 tablet Oral BID WC  . cholecalciferol  1,000 Units Oral Daily  . citalopram  10 mg Oral Daily  . febuxostat  40 mg Oral Daily  . feeding supplement (ENSURE ENLIVE)  237 mL Oral BID BM  . ferrous sulfate  325 mg Oral Q breakfast  . hydroxychloroquine  400 mg Oral QPC breakfast  . mouth rinse  15 mL Mouth Rinse BID  . mirtazapine  7.5 mg Oral QHS  . mometasone-formoterol  2 puff Inhalation BID  . pantoprazole  40 mg Oral BID  . pravastatin  40 mg Oral Daily  . sodium chloride  flush  10-40 mL Intracatheter Q12H  . tamsulosin  0.4 mg Oral Daily  . Warfarin - Pharmacist Dosing Inpatient   Does not apply q1800    Infusions: . sodium chloride    . amiodarone 30 mg/hr (05/03/17 0026)  . norepinephrine (LEVOPHED) Adult infusion Stopped (04/26/17 1855)  . vasopressin (PITRESSIN) infusion - *FOR SHOCK* Stopped (04/27/17 1230)    PRN Medications: acetaminophen, albuterol, diphenhydrAMINE, fentaNYL (SUBLIMAZE) injection, hydrOXYzine, Melatonin, ondansetron (ZOFRAN) IV, sodium chloride flush   Assessment/Plan   1. 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 Noted to have functional SBO with 3 retained capsule cameras.  Has had multiple GI work-ups at [facility]. Would not repeat scope unless rebleeds. INR 2.27.   -- Hgb down to 7.9 but I suspect its related oozing from L buttock. Will give 1UPRBCs today. CBC in am.  -- Has had multiple transfusions this admission.  -- Continue coumadin. d/w PharmD 2. Acute on chronic systolic CHF due to ICM s/p LVAD 04/2012 at [facility] for DT.   -- CVP low today. Hold lasix 3. Chronic anticoagulation for LVAD/afib:  --Goal INR 1.7-2.3, no ASA. INR 2.27 Pharm D dosing.   4. Chronic Afib:  --INR 2.27 Rate controlled.  Discussed with Pharm D. .    5. Shock Septic with productive cough: suspect HCAP.  CXR with L basilar airspace opacity.  PCT falling,  Off pressors.  WBC 7.4  -- Antibiotics stopped 5/29. 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 He has had 7 days of antibiotics.  6. AKI on CKD IV:  Creatinine unchanged 1.78 . Repeat BMET in am.    7. 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 Skin appears to be slowly improving.  May need silver nitrate to L buttock.  Trunk, extremities, penis, buttocks partial thickness skin loss.  Vanc stopped 5/29.  Continue silicone dressing to extremities  would not remove for 5 days.  Continue xeroform to buttock and penis  wounds.  Continue low air loss overload and reposition R to L only.  8. Depression -- He has multiple somatic complaints and appears to have little hope. Continue Celexa. 9. Hyperkalemia - K 4.7   9.NSVT- on amio drip. K 4.7 Check Mag in am.  10. Severe Malnutrition- Dietitians following. Prealbumin 7.3. Continue regular diet. Encouraged to eat protein.  10.  Dispo- Holding on d/c with severe skin sloughing. Possible CIR down the road versus SNF.   Needs to use IS every hour.    I reviewed the LVAD parameters from today, and compared the results to the patient's prior recorded data.  No programming changes were made.  The LVAD is functioning within specified parameters.  The nurse performs LVAD self-test daily.  LVAD interrogation was negative for any significant power changes, alarms or PI events/speed drops.  LVAD equipment check completed and is in good working order.  Back-up equipment present.   LVAD education done on emergency procedures and precautions and reviewed exit site care.  Length of Stay: 10  [name], NP 05/03/2017, 8:16 AM  VAD Team --- VAD ISSUES ONLY--- Pager [phone] (7am - 7am)  Advanced Heart Failure Team  Pager [phone] (M-F; 7a - 4p)  Please contact [facility] for night-coverage after hours (4p -7a ) and weekends on [email or web address]   Patient seen and examined with [name], NP. We discussed all aspects of the encounter. I agree with the assessment and plan as stated above.   Continues to feel weak. Co-ox is down. But hgb also low. Will transfuse 1u PRBCs. VAD parameters are stable. Volume status low today. Will hold off on lasix.   Skin sloughing is slowing down. Wounds starting to heal. Appreciate WOC input. Need to increase protein intake as prealbumin is very low.   PNA resolved.   AF is stable. Rate controlled. INR ok. D/w PharmD  Remains very debilitated. Needs to start working with  PT.   Arvilla Meres, MD  11:30 AM

## 2017-05-03 NOTE — Progress Notes (Signed)
PT Cancellation Note  Patient Details Name: [name] MRN: [id number] DOB: [birth year]   Cancelled Treatment:    Reason Eval/Treat Not Completed: Fatigue limiting ability to participate;Pain limiting ability to participate. Pt reports he is too tired to participate.    [name] Maycok 05/03/2017, 1:27 PM [employer] PT [phone]

## 2017-05-03 NOTE — Consult Note (Addendum)
WOC Nurse wound follow up Discussed plan of care with [name], NP.  Skin to perineum and groin is described as red, macerated, and moist with patchy areas of partial thickness skin loss.  Interdry silver-impregnated fabric ordered for use by bedside nurses and instructions provided.  This product should remain in place for 5 days for optimal plan of care to provide antimicrobial benefits and wick moisture away from skin.  Please re-consult if further assistance is needed.  Thank-you,  Cammie Mcgee MSN, RN, CWOCN, [place], CNS [phone]

## 2017-05-03 DEATH — deceased

## 2017-05-04 LAB — BASIC METABOLIC PANEL
ANION GAP: 5 (ref 5–15)
BUN: 60 mg/dL — ABNORMAL HIGH (ref 6–20)
CO2: 26 mmol/L (ref 22–32)
Calcium: 7.5 mg/dL — ABNORMAL LOW (ref 8.9–10.3)
Chloride: 106 mmol/L (ref 101–111)
Creatinine, Ser: 1.76 mg/dL — ABNORMAL HIGH (ref 0.61–1.24)
GFR calc Af Amer: 41 mL/min — ABNORMAL LOW (ref >60)
GFR, EST NON AFRICAN AMERICAN: 35 mL/min — AB (ref >60)
Glucose, Bld: 138 mg/dL — ABNORMAL HIGH (ref 65–99)
POTASSIUM: 4.9 mmol/L (ref 3.5–5.1)
SODIUM: 137 mmol/L (ref 135–145)

## 2017-05-04 LAB — TYPE AND SCREEN
ABO/RH(D): A POS
Antibody Screen: NEGATIVE
Unit division: 0

## 2017-05-04 LAB — PROTIME-INR
INR: 2.15
Prothrombin Time: 24.3 seconds — ABNORMAL HIGH (ref 11.4–15.2)

## 2017-05-04 LAB — CBC
HCT: 29.1 % — ABNORMAL LOW (ref 39.0–52.0)
Hemoglobin: 8.4 g/dL — ABNORMAL LOW (ref 13.0–17.0)
MCH: 27 pg (ref 26.0–34.0)
MCHC: 28.9 g/dL — ABNORMAL LOW (ref 30.0–36.0)
MCV: 93.6 fL (ref 78.0–100.0)
Platelets: 278 10*3/uL (ref 150–400)
RBC: 3.11 MIL/uL — AB (ref 4.22–5.81)
RDW: 20.2 % — ABNORMAL HIGH (ref 11.5–15.5)
WBC: 8.3 10*3/uL (ref 4.0–10.5)

## 2017-05-04 LAB — BPAM RBC
BLOOD PRODUCT EXPIRATION DATE: 201806102359
ISSUE DATE / TIME: 201806011414
Unit Type and Rh: 6200

## 2017-05-04 LAB — COOXEMETRY PANEL
Carboxyhemoglobin: 2.1 % — ABNORMAL HIGH (ref 0.5–1.5)
Methemoglobin: 0.6 % (ref 0.0–1.5)
O2 Saturation: 72.1 %
Total hemoglobin: 14.8 g/dL (ref 12.0–16.0)

## 2017-05-04 LAB — LACTATE DEHYDROGENASE: LDH: 125 U/L (ref 98–192)

## 2017-05-04 MED ORDER — WARFARIN SODIUM 2 MG PO TABS
2.0000 mg | ORAL_TABLET | Freq: Once | ORAL | Status: AC
  Administered 2017-05-04: 2 mg via ORAL
  Filled 2017-05-04: qty 1

## 2017-05-04 MED ORDER — FUROSEMIDE 10 MG/ML IJ SOLN
40.0000 mg | Freq: Once | INTRAMUSCULAR | Status: AC
  Administered 2017-05-04: 40 mg via INTRAVENOUS
  Filled 2017-05-04: qty 4

## 2017-05-04 MED ORDER — AMIODARONE HCL 200 MG PO TABS
200.0000 mg | ORAL_TABLET | Freq: Two times a day (BID) | ORAL | Status: DC
  Administered 2017-05-04 – 2017-05-05 (×3): 200 mg via ORAL
  Filled 2017-05-04 (×3): qty 1

## 2017-05-04 NOTE — Progress Notes (Signed)
Patient ID: [name], male   DOB: 09/14/1941, 76 y.o.   MRN: [id number]   Advanced Heart Failure VAD Team Note  Subjective:    Admitted from [facility] 04-26-17 with black, tarry stools and Hgb 6.6.  Septic/PNA- antibiotics stopped on 5/29.    Wounds still hurt. Butt sore. Remains very fatigued.   Poor appetite. Very depressed,.   Co-ox 72% CVP 10  LVAD INTERROGATION:  HeartMate II LVAD:  Flow 6.0 liters/min, speed 9400, power 6.0 PI 4.1    Occasional PI events.    Objective:    Vital Signs:   Temp:  [97.6 F (36.4 C)-98.8 F (37.1 C)] 98.4 F (36.9 C) (06/02 0700) Pulse Rate:  [56-115] 61 (06/02 0700) Resp:  [6-21] 9 (06/02 0700) SpO2:  [90 %-100 %] 99 % (06/02 0700) Weight:  [93.9 kg (207 lb)] 93.9 kg (207 lb) (06/02 0300) Last BM Date: 05/01/17 Mean arterial Pressure 70-80s Personally reviewed  Intake/Output:   Intake/Output Summary (Last 24 hours) at 05/04/17 0909 Last data filed at 05/04/17 0800  Gross per 24 hour  Intake           1736.3 ml  Output              625 ml  Net           1111.3 ml    Physical Exam: CVP 10 GENERAL: Elderly Sitting up in bed. Reading the paper  HEENT: normal  NECK: Supple, JVP to jaw.  2+ bilaterally, no bruits.  No lymphadenopathy or thyromegaly appreciated.   CARDIAC:  Mechanical heart sounds with LVAD hum present.  Normal VAD sounds LUNGS:  Decreased throughout basilar crackles.  ABDOMEN:  NT/ND good BS     LVAD exit site: well-healed and incorporated.  Dressing dry and intact.  No erythema or drainage.  Stabilization device present and accurately applied.  No signs infection EXTREMITIES:  Multiple abrasions and ecchymosis. 1+ edema. Pale NEUROLOGIC:  Alert and oriented x 4.  Weak throughout.   Affect pleasant.    Skin: Multiple areas of partial thickness skin loss on trunk, extremities, buttocks, and penis. Reapplied xerofrom to buttocks, interdry to groin, and silicone dressing remains on trunk and legs. Wounds appear to be  drying and crusting,    Telemetry: A Fib 70s with PVCs and occasional NSVT. Personally reviewed .   Labs: Basic Metabolic Panel:  Recent Labs Lab 04/30/17 1612 05/01/17 0354 05/01/17 1208 05/02/17 0500 05/03/17 0438 05/04/17 0500  NA 136 136 137 138 137 137  K 5.1 5.3* 5.0 4.8 4.7 4.9  CL 109 108 108 107 106 106  CO2 22 23 23 24 25 26   GLUCOSE 101* 94 129* 124* 129* 138*  BUN 50* 53* 51* 53* 55* 60*  CREATININE 1.55* 1.67* 1.71* 1.78* 1.78* 1.76*  CALCIUM 8.1* 8.0* 7.9* 7.6* 7.5* 7.5*  MG 2.1 2.6*  --   --   --   --     Liver Function Tests:  Recent Labs Lab 05/01/17 1208  AST 20  ALT 17  ALKPHOS 105  BILITOT 0.6  PROT 4.6*  ALBUMIN 1.6*   No results for input(s): LIPASE, AMYLASE in the last 168 hours. No results for input(s): AMMONIA in the last 168 hours.  CBC:  Recent Labs Lab 04/30/17 0427 05/01/17 0354 05/02/17 0855 05/03/17 0438 05/04/17 0500  WBC 11.2* 11.2* 8.4 7.4 8.3  NEUTROABS  --  9.0*  --   --   --   HGB 8.9* 8.9* 8.3*  7.9* 8.4*  HCT 29.9* 29.7* 28.5* 27.1* 29.1*  MCV 93.4 94.3 94.1 94.1 93.6  PLT 286 305 300 300 278    INR:  Recent Labs Lab 04/30/17 0427 05/01/17 0354 05/02/17 0500 05/03/17 0438 05/04/17 0500  INR 1.77 1.79 2.19 2.27 2.15    Other results:   Imaging: No results found.   Medications:     Scheduled Medications: . calcium carbonate  1 tablet Oral BID WC  . cholecalciferol  1,000 Units Oral Daily  . citalopram  10 mg Oral Daily  . febuxostat  40 mg Oral Daily  . feeding supplement (ENSURE ENLIVE)  237 mL Oral BID BM  . ferrous sulfate  325 mg Oral Q breakfast  . hydroxychloroquine  400 mg Oral QPC breakfast  . mouth rinse  15 mL Mouth Rinse BID  . mirtazapine  7.5 mg Oral QHS  . mometasone-formoterol  2 puff Inhalation BID  . pantoprazole  40 mg Oral BID  . pravastatin  40 mg Oral Daily  . sodium chloride flush  10-40 mL Intracatheter Q12H  . tamsulosin  0.4 mg Oral Daily  . Warfarin -  Pharmacist Dosing Inpatient   Does not apply q1800    Infusions: . amiodarone 30 mg/hr (05/04/17 0800)    PRN Medications: acetaminophen, albuterol, diphenhydrAMINE, fentaNYL (SUBLIMAZE) injection, hydrOXYzine, Melatonin, ondansetron (ZOFRAN) IV, sodium chloride flush   Assessment/Plan   1. 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 Noted to have functional SBO with 3 retained capsule cameras.  Has had multiple GI work-ups at [facility]. Would not repeat scope unless rebleeds. -- Has had multiple transfusions this admission.  -- Received another unit of PRBCs 6/1 Hgb 7.9->8.4. Will follow.  -- INR 2.15. Continue coumadin. d/w PharmD personally this am  2. Acute on chronic systolic CHF due to ICM s/p LVAD 04/2012 at [facility] for DT.   -- CVP back up today. Give one dose IV lasix  3. Chronic anticoagulation for LVAD/afib:  --Goal INR 1.7-2.3, no ASA. INR 2.15 Pharm D dosing.   4. Chronic Afib:  --INR 2.15 Rate controlled.     5. Shock Septic with productive cough: suspect HCAP.  CXR with L basilar airspace opacity.  PCT falling,  Off pressors.  WBC 7.4  -- Antibiotics stopped 5/29. 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 He has had 7 days of antibiotics.  6. AKI on CKD IV:  --Creatinine unchanged 1.76 . Continue to follow renal function closely with diuresis. Repeat BMET in am.    7. 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  -- Appreciate WOC recs: Continue silicone dressing to extremities would not remove for 5 days.  Continue xeroform to buttock and penis wounds.  Continue low air loss overload and reposition R to L only.  8. Depression -- Long talk with him and his wife today about his progressive decline. I told him that he is at high risk for dying in the next few months due to poor functional status and  malnutrition.He says he hasn't given up yet. Stressed need for participating with PT and increasing po intake 9. Hyperkalemia  - K 4.9. Not on K or spiro   10. NSVT - on amio drip. Improving.  Will change to po.  11. Severe Malnutrition - Nutrition following. Prealbumin 7.3. Encouraged po  intake   I reviewed the LVAD parameters from today, and compared the results to the patient's prior recorded data.  No programming changes were made.  The LVAD is functioning within specified parameters.  The nurse performs LVAD self-test daily.  LVAD interrogation was negative for any significant power changes, alarms or PI events/speed drops.  LVAD equipment check completed and is in good working order.  Back-up equipment present.   LVAD education done on emergency procedures and precautions and reviewed exit site care.  Length of Stay: 11  Arvilla Meres, MD 05/04/2017, 9:09 AM  VAD Team --- VAD ISSUES ONLY--- Pager [phone] (7am - 7am)  Advanced Heart Failure Team  Pager [phone] (M-F; 7a - 4p)  Please contact [facility] for night-coverage after hours (4p -7a ) and weekends on [email or web address]

## 2017-05-04 NOTE — Progress Notes (Signed)
ANTICOAGULATION CONSULT NOTE   Pharmacy Consult for warfarin Indication: LVAD  Allergies  Allergen Reactions  . Amoxicillin-Pot Clavulanate Rash  . Vancomycin Hives    Red rash with blisters/sloughing  . Levofloxacin Rash    Doesn't tolerate  . Oxycodone Hives  . Ace Inhibitors Rash  . Ceftriaxone Rash  . Cefuroxime Rash  . Chlorhexidine Gluconate Rash    H/o recurrent cellulitis and rash   . Clindamycin/Lincomycin Rash    Erythematous drug eruption  . Hydralazine Itching  . Meropenem Rash  . Sulfonamide Derivatives Rash    Mother was highly allergic. Patient untested.   [name] Zosyn [Piperacillin Sod-Tazobactam So] Rash    No other systemic symptoms    Patient Measurements: Height: 5\' 7"  (170.2 cm) Weight: 207 lb (93.9 kg) IBW/kg (Calculated) : 66.1 Heparin Dosing Weight: n/a  Vital Signs: Temp: 98.4 F (36.9 C) (06/02 0700) Temp Source: Oral (06/02 0700) Pulse Rate: 61 (06/02 0700)  Labs:  Recent Labs  05/02/17 0500  05/02/17 0855 05/03/17 0438 05/04/17 0500  HGB  --   < > 8.3* 7.9* 8.4*  HCT  --   --  28.5* 27.1* 29.1*  PLT  --   --  300 300 278  LABPROT 24.7*  --   --  25.4* 24.3*  INR 2.19  --   --  2.27 2.15  CREATININE 1.78*  --   --  1.78* 1.76*  < > = values in this interval not displayed.  Estimated Creatinine Clearance: 37.8 mL/min (A) (by C-G formula based on SCr of 1.76 mg/dL (H)).   Medical History: Past Medical History:  Diagnosis Date  . Allergy   . Anemia   . Anemia of chronic renal failure, stage 4 (severe) (HCC) 02/11/2017  . Angina   . Anxiety   . Blood transfusion   . CAD (coronary artery disease)   . Cataract   . CHF (congestive heart failure) (HCC)   . Clotting disorder (HCC)   . COPD (chronic obstructive pulmonary disease) (HCC)   . Diabetes mellitus   . DVT of lower extremity (deep venous thrombosis) (HCC)    left  . Gout   . H/O hiatal hernia   . Heart murmur   . High cholesterol   . Hypertension   . Hypotension    ORTHOSTATIC  . Iron deficiency anemia due to chronic blood loss 02/11/2017  . Jaundice    "@ birth & when I was in 7th grade"  . Malabsorption of iron 02/11/2017  . Mitral valve disorder   . Myocardial infarction (HCC) 1988  . Neuromuscular disorder (HCC)   . Osteoporosis   . Pneumonia 1993; 2009   "both serious cases"  . Pneumonia 2010   "not hospitalized"  . Pulmonary embolism on left (HCC) 1988  . 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 Shortness of breath on exertion   . Sleep apnea   . Stomach ulcer 1987-1988  . Stroke [facility]) 1987   "brain stem; skin sensitive since then; once in awhile slur"12/13/11)    Medications:  Scheduled:  . amiodarone  200 mg Oral BID  . calcium carbonate  1 tablet Oral BID WC  . cholecalciferol  1,000 Units Oral Daily  . citalopram  10 mg Oral Daily  . febuxostat  40 mg Oral Daily  . feeding supplement (ENSURE ENLIVE)  237 mL Oral BID BM  . ferrous sulfate  325 mg Oral Q breakfast  . hydroxychloroquine  400 mg  Oral QPC breakfast  . mouth rinse  15 mL Mouth Rinse BID  . mirtazapine  7.5 mg Oral QHS  . mometasone-formoterol  2 puff Inhalation BID  . pantoprazole  40 mg Oral BID  . pravastatin  40 mg Oral Daily  . sodium chloride flush  10-40 mL Intracatheter Q12H  . tamsulosin  0.4 mg Oral Daily  . warfarin  2 mg Oral ONCE-1800  . Warfarin - Pharmacist Dosing Inpatient   Does not apply q1800    Assessment: 76 yo male with LVAD on chronic warfarin.  Admitted with supratherapeutic INR of 4.12 and GIB. Warfarin was held 5/22-5/27 and restarted 5/28.   INR remains therapeutic at 2.15, LDH stable 120s.  Watch INRs  closely as amiodarone started this week for NSVT/Afib RVR but also started on ensure BID (which contains vit k) d/t poor oral intake and low prealbumin. Slight oozing from wounds and h/h improved after 1 ut prbc.  PLCT stable.     5/29 - amio started (not on PTA)  PTA Warfarin dose 2 mg daily.  Per patient report was recently  decreased from 2 mg daily except 3 mg on MWF. No aspirin.  Goal of Therapy:  INR 1.7-2.3 (per [facility]) Monitor platelets by anticoagulation protocol: Yes   Plan:  Warfarin 2mg  x1 tonight. Daily PT/INR/LDH   Leota Sauers Pharm.D. CPP, BCPS Clinical Pharmacist [phone] 05/04/2017 9:18 AM

## 2017-05-05 LAB — CBC
HEMATOCRIT: 27.6 % — AB (ref 39.0–52.0)
HEMOGLOBIN: 8.1 g/dL — AB (ref 13.0–17.0)
MCH: 27.4 pg (ref 26.0–34.0)
MCHC: 29.3 g/dL — ABNORMAL LOW (ref 30.0–36.0)
MCV: 93.2 fL (ref 78.0–100.0)
Platelets: 249 10*3/uL (ref 150–400)
RBC: 2.96 MIL/uL — AB (ref 4.22–5.81)
RDW: 20.1 % — ABNORMAL HIGH (ref 11.5–15.5)
WBC: 6.6 10*3/uL (ref 4.0–10.5)

## 2017-05-05 LAB — COOXEMETRY PANEL
Carboxyhemoglobin: 1.6 % — ABNORMAL HIGH (ref 0.5–1.5)
METHEMOGLOBIN: 1.2 % (ref 0.0–1.5)
O2 Saturation: 79.9 %
Total hemoglobin: 8 g/dL — ABNORMAL LOW (ref 12.0–16.0)

## 2017-05-05 LAB — LACTATE DEHYDROGENASE: LDH: 128 U/L (ref 98–192)

## 2017-05-05 LAB — PROTIME-INR
INR: 2.37
Prothrombin Time: 26.3 seconds — ABNORMAL HIGH (ref 11.4–15.2)

## 2017-05-05 MED ORDER — FUROSEMIDE 10 MG/ML IJ SOLN
40.0000 mg | Freq: Two times a day (BID) | INTRAMUSCULAR | Status: DC
  Administered 2017-05-05: 40 mg via INTRAVENOUS
  Filled 2017-05-05 (×2): qty 4

## 2017-05-05 MED ORDER — FENTANYL CITRATE (PF) 100 MCG/2ML IJ SOLN
25.0000 ug | INTRAMUSCULAR | Status: DC | PRN
  Administered 2017-05-05 – 2017-05-11 (×21): 25 ug via INTRAVENOUS
  Filled 2017-05-05 (×23): qty 2

## 2017-05-05 MED ORDER — WARFARIN SODIUM 2 MG PO TABS
1.0000 mg | ORAL_TABLET | Freq: Once | ORAL | Status: AC
  Administered 2017-05-05: 1 mg via ORAL
  Filled 2017-05-05: qty 0.5

## 2017-05-05 NOTE — Progress Notes (Signed)
Patient ID: [name], male   DOB: October 22, 1941, 76 y.o.   MRN: [id number]   Advanced Heart Failure VAD Team Note  Subjective:    Admitted 2017-05-19 with black, tarry stools and Hgb 6.6.  Septic/PNA- antibiotics stopped on 5/29.    Long talk with him yesterday about need to eat more and be more engaged in therapy and using incentive spirometer.   Co-ox 80% CVP 10  LVAD INTERROGATION:  HeartMate II LVAD:  Flow 6.3 liters/min, speed 9400, power 6.0 PI 3.1    Rare PI events.    Objective:    Vital Signs:   Temp:  [98 F (36.7 C)-98.3 F (36.8 C)] 98 F (36.7 C) (06/03 0700) Pulse Rate:  [57-110] 84 (06/03 0900) Resp:  [6-18] 14 (06/03 0900) BP: (81-88)/(62-66) 88/62 (06/03 0433) SpO2:  [92 %-100 %] 100 % (06/03 0950) Weight:  [94.8 kg (208 lb 14.4 oz)] 94.8 kg (208 lb 14.4 oz) (06/03 0433) Last BM Date: 05/04/17 Mean arterial Pressure 70-80s  Intake/Output:   Intake/Output Summary (Last 24 hours) at 05/05/17 1309 Last data filed at 05/05/17 1200  Gross per 24 hour  Intake              720 ml  Output             1100 ml  Net             -380 ml    Physical Exam: CVP 10 GENERAL: Elderly Sitting up in bed. Talking to son. Smiling HEENT: normal anicteric NECK: Supple, CVP 10  2+ bilaterally, no bruits.  No lymphadenopathy or thyromegaly appreciated.   CARDIAC:  Mechanical heart sounds with LVAD hum present.  Normal VAD sounds LUNGS:  Decreased at bases. Otherwise clear.  ABDOMEN:  Soft NT/ND. Good BS  LVAD exit site: well-healed and incorporated.  Dressing dry and intact.  No erythema or drainage.  Stabilization device present and accurately applied.  No signs infection EXTREMITIES:  Multiple abrasions and ecchymosis. 1-2+ edema. Pale NEUROLOGIC:  Alert and oriented x 4.  Weak throughout.   Affect pleasant.    Skin: Multiple areas of partial thickness skin loss on trunk, extremities, buttocks, and penis. Reapplied xerofrom to buttocks, interdry to groin, and silicone  dressing remains on trunk and legs. Wounds appear to be drying and crusting,    Telemetry: A Fib 70-80s with PVCs. Personally reviewed .   Labs: Basic Metabolic Panel:  Recent Labs Lab 04/30/17 1612 05/01/17 0354 05/01/17 1208 05/02/17 0500 05/03/17 0438 05/04/17 0500  NA 136 136 137 138 137 137  K 5.1 5.3* 5.0 4.8 4.7 4.9  CL 109 108 108 107 106 106  CO2 22 23 23 24 25 26   GLUCOSE 101* 94 129* 124* 129* 138*  BUN 50* 53* 51* 53* 55* 60*  CREATININE 1.55* 1.67* 1.71* 1.78* 1.78* 1.76*  CALCIUM 8.1* 8.0* 7.9* 7.6* 7.5* 7.5*  MG 2.1 2.6*  --   --   --   --     Liver Function Tests:  Recent Labs Lab 05/01/17 1208  AST 20  ALT 17  ALKPHOS 105  BILITOT 0.6  PROT 4.6*  ALBUMIN 1.6*   No results for input(s): LIPASE, AMYLASE in the last 168 hours. No results for input(s): AMMONIA in the last 168 hours.  CBC:  Recent Labs Lab 05/01/17 0354 05/02/17 0855 05/03/17 0438 05/04/17 0500 05/05/17 0411  WBC 11.2* 8.4 7.4 8.3 6.6  NEUTROABS 9.0*  --   --   --   --  HGB 8.9* 8.3* 7.9* 8.4* 8.1*  HCT 29.7* 28.5* 27.1* 29.1* 27.6*  MCV 94.3 94.1 94.1 93.6 93.2  PLT 305 300 300 278 249    INR:  Recent Labs Lab 05/01/17 0354 05/02/17 0500 05/03/17 0438 05/04/17 0500 05/05/17 0411  INR 1.79 2.19 2.27 2.15 2.37    Other results:   Imaging: No results found.   Medications:     Scheduled Medications: . amiodarone  200 mg Oral BID  . calcium carbonate  1 tablet Oral BID WC  . cholecalciferol  1,000 Units Oral Daily  . citalopram  10 mg Oral Daily  . febuxostat  40 mg Oral Daily  . feeding supplement (ENSURE ENLIVE)  237 mL Oral BID BM  . ferrous sulfate  325 mg Oral Q breakfast  . hydroxychloroquine  400 mg Oral QPC breakfast  . mirtazapine  7.5 mg Oral QHS  . mometasone-formoterol  2 puff Inhalation BID  . pantoprazole  40 mg Oral BID  . pravastatin  40 mg Oral Daily  . sodium chloride flush  10-40 mL Intracatheter Q12H  . tamsulosin  0.4 mg Oral  Daily  . warfarin  1 mg Oral ONCE-1800  . Warfarin - Pharmacist Dosing Inpatient   Does not apply q1800    Infusions:   PRN Medications: acetaminophen, albuterol, diphenhydrAMINE, fentaNYL (SUBLIMAZE) injection, hydrOXYzine, Melatonin, ondansetron (ZOFRAN) IV, sodium chloride flush   Assessment/Plan   1. 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 Noted to have functional SBO with 3 retained capsule cameras.  Has had multiple GI work-ups at [facility]. Would not repeat scope unless rebleeds. -- Has had multiple transfusions this admission.  -- Received another unit of PRBCs 6/1 Hgb 7.9->8.4>8.1. Will follow. May need another transfusion in am. 2. Acute on chronic systolic CHF due to ICM s/p LVAD 04/2012 at [facility] for DT.   -- CVP remains elevated. Lasix 40 IV bid today --Co-ox 80% 3. Chronic anticoagulation for LVAD/afib:  --Goal INR 1.7-2.3, no ASA. -- INR 2.37  Continue coumadin. d/w PharmD personally this am  4. Chronic Afib:  --On coumadin. Rate controlled. Will stop amio 5. Shock Septic with productive cough: suspect HCAP.  CXR with L basilar airspace opacity.  PCT falling,  Off pressors.  WBC 7.4  -- Antibiotics stopped 5/29. 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 He has had 7 days of antibiotics.  6. AKI on CKD IV:  --Creatinine unchanged 1.76 yesterday. No BMET today. Continue to follow renal function closely with diuresis. Repeat BMET in am.    7. 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  -- Appreciate WOC recs: Continue silicone dressing to extremities would not remove for 5 days.  Continue xeroform to buttock and penis wounds.  Continue low air loss overload and reposition R to L only.  8. Depression -- Long talk with him and his wife yesterday about his progressive  decline. I told him that he is at high risk for dying in the next few months due to poor functional status and malnutrition.He says he hasn't given up yet. Stressed need for participating with PT and increasing po intake --He had improved po intake yesterday (ate hamburger from [employer]) Stressed need fir continued protein intake, working with PT and Incentive spirometer every hour 9. Hyperkalemia  -  K 4.9. Not on K or spiro   10. NSVT - improved. Can stop amio  11. Severe Malnutrition - Nutrition following. Prealbumin 7.3. Encouraged po intake   I reviewed the LVAD parameters from today, and compared the results to the patient's prior recorded data.  No programming changes were made.  The LVAD is functioning within specified parameters.  The nurse performs LVAD self-test daily.  LVAD interrogation was negative for any significant power changes, alarms or PI events/speed drops.  LVAD equipment check completed and is in good working order.  Back-up equipment present.   LVAD education done on emergency procedures and precautions and reviewed exit site care.  Length of Stay: 12  Arvilla Meres, MD 05/05/2017, 1:09 PM  VAD Team --- VAD ISSUES ONLY--- Pager [phone] (7am - 7am)  Advanced Heart Failure Team  Pager [phone] (M-F; 7a - 4p)  Please contact [facility] for night-coverage after hours (4p -7a ) and weekends on [email or web address]

## 2017-05-05 NOTE — Progress Notes (Signed)
ANTICOAGULATION CONSULT NOTE   Pharmacy Consult for warfarin Indication: LVAD  Allergies  Allergen Reactions  . Amoxicillin-Pot Clavulanate Rash  . Vancomycin Hives    Red rash with blisters/sloughing  . Levofloxacin Rash    Doesn't tolerate  . Oxycodone Hives  . Ace Inhibitors Rash  . Ceftriaxone Rash  . Cefuroxime Rash  . Chlorhexidine Gluconate Rash    H/o recurrent cellulitis and rash   . Clindamycin/Lincomycin Rash    Erythematous drug eruption  . Hydralazine Itching  . Meropenem Rash  . Sulfonamide Derivatives Rash    Mother was highly allergic. Patient untested.   [name] Zosyn [Piperacillin Sod-Tazobactam So] Rash    No other systemic symptoms    Patient Measurements: Height: 5\' 7"  (170.2 cm) Weight: 208 lb 14.4 oz (94.8 kg) IBW/kg (Calculated) : 66.1 Heparin Dosing Weight: n/a  Vital Signs: Temp: 98 F (36.7 C) (06/03 0700) Temp Source: Oral (06/03 0700) BP: 88/62 (06/03 0433) Pulse Rate: 84 (06/03 0900)  Labs:  Recent Labs  05/03/17 0438 05/04/17 0500 05/05/17 0411  HGB 7.9* 8.4* 8.1*  HCT 27.1* 29.1* 27.6*  PLT 300 278 249  LABPROT 25.4* 24.3* 26.3*  INR 2.27 2.15 2.37  CREATININE 1.78* 1.76*  --     Estimated Creatinine Clearance: 38 mL/min (A) (by C-G formula based on SCr of 1.76 mg/dL (H)).   Medical History: Past Medical History:  Diagnosis Date  . Allergy   . Anemia   . Anemia of chronic renal failure, stage 4 (severe) (HCC) 02/11/2017  . Angina   . Anxiety   . Blood transfusion   . CAD (coronary artery disease)   . Cataract   . CHF (congestive heart failure) (HCC)   . Clotting disorder (HCC)   . COPD (chronic obstructive pulmonary disease) (HCC)   . Diabetes mellitus   . DVT of lower extremity (deep venous thrombosis) (HCC)    left  . Gout   . H/O hiatal hernia   . Heart murmur   . High cholesterol   . Hypertension   . Hypotension    ORTHOSTATIC  . Iron deficiency anemia due to chronic blood loss 02/11/2017  . Jaundice    "@ birth & when I was in 7th grade"  . Malabsorption of iron 02/11/2017  . Mitral valve disorder   . Myocardial infarction (HCC) 1988  . Neuromuscular disorder (HCC)   . Osteoporosis   . Pneumonia 1993; 2009   "both serious cases"  . Pneumonia 2010   "not hospitalized"  . Pulmonary embolism on left (HCC) 1988  . 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 Shortness of breath on exertion   . Sleep apnea   . Stomach ulcer 1987-1988  . 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 amiodarone  200 mg Oral BID  . calcium carbonate  1 tablet Oral BID WC  . cholecalciferol  1,000 Units Oral Daily  . citalopram  10 mg Oral Daily  . febuxostat  40 mg Oral Daily  . feeding supplement (ENSURE ENLIVE)  237 mL Oral BID BM  . ferrous sulfate  325 mg Oral Q breakfast  . hydroxychloroquine  400 mg Oral QPC breakfast  . mirtazapine  7.5 mg Oral QHS  . mometasone-formoterol  2 puff Inhalation BID  . pantoprazole  40 mg Oral BID  . pravastatin  40 mg Oral Daily  . sodium chloride  flush  10-40 mL Intracatheter Q12H  . tamsulosin  0.4 mg Oral Daily  . warfarin  1 mg Oral ONCE-1800  . Warfarin - Pharmacist Dosing Inpatient   Does not apply q1800    Assessment: 76 yo male with LVAD on chronic warfarin.  Admitted with supratherapeutic INR of 4.12 and GIB. Warfarin was held 5/22-5/27 and restarted 5/28.   INR remains therapeutic at 2.3 LDH stable 120s.  Watch INRs  closely as amiodarone started this week for NSVT/Afib RVR but also started on ensure BID (which contains vit k) d/t poor oral intake and low prealbumin. Slight oozing from wounds and h/h improved after 1 ut prbc.  PLCT stable.     5/29 - amio started (not on PTA)  PTA Warfarin dose 2 mg daily.  Per patient report was recently decreased from 2 mg daily except 3 mg on MWF. No aspirin.  Goal of Therapy:  INR 1.7-2.3 (per [facility]) Monitor platelets by  anticoagulation protocol: Yes   Plan:  Warfarin 1mg  x1 tonight. Daily PT/INR/LDH   Leota Sauers Pharm.D. CPP, BCPS Clinical Pharmacist [phone] 05/05/2017 12:49 PM

## 2017-05-05 NOTE — Progress Notes (Signed)
Called VAD coordinator to discuss drive line dressing change. Wife stated that it is due and she wanted to change it. VAD coordinator will come by and assess bandage site and make decision on when to change, due to skin fragility.

## 2017-05-06 LAB — BASIC METABOLIC PANEL
Anion gap: 6 (ref 5–15)
BUN: 62 mg/dL — ABNORMAL HIGH (ref 6–20)
CO2: 26 mmol/L (ref 22–32)
Calcium: 7.5 mg/dL — ABNORMAL LOW (ref 8.9–10.3)
Chloride: 106 mmol/L (ref 101–111)
Creatinine, Ser: 1.89 mg/dL — ABNORMAL HIGH (ref 0.61–1.24)
GFR calc Af Amer: 38 mL/min — ABNORMAL LOW (ref >60)
GFR, EST NON AFRICAN AMERICAN: 32 mL/min — AB (ref >60)
GLUCOSE: 88 mg/dL (ref 65–99)
Potassium: 5 mmol/L (ref 3.5–5.1)
Sodium: 138 mmol/L (ref 135–145)

## 2017-05-06 LAB — PROTIME-INR
INR: 2.6
Prothrombin Time: 28.3 seconds — ABNORMAL HIGH (ref 11.4–15.2)

## 2017-05-06 LAB — LACTATE DEHYDROGENASE: LDH: 137 U/L (ref 98–192)

## 2017-05-06 LAB — CBC
HEMATOCRIT: 27.4 % — AB (ref 39.0–52.0)
Hemoglobin: 8 g/dL — ABNORMAL LOW (ref 13.0–17.0)
MCH: 27.4 pg (ref 26.0–34.0)
MCHC: 29.2 g/dL — ABNORMAL LOW (ref 30.0–36.0)
MCV: 93.8 fL (ref 78.0–100.0)
PLATELETS: 243 10*3/uL (ref 150–400)
RBC: 2.92 MIL/uL — AB (ref 4.22–5.81)
RDW: 20 % — ABNORMAL HIGH (ref 11.5–15.5)
WBC: 7.9 10*3/uL (ref 4.0–10.5)

## 2017-05-06 MED ORDER — SILVER NITRATE-POT NITRATE 75-25 % EX MISC
1.0000 "application " | Freq: Once | CUTANEOUS | Status: AC
  Administered 2017-05-06: 1 via TOPICAL
  Filled 2017-05-06: qty 1

## 2017-05-06 MED ORDER — TRAMADOL HCL 50 MG PO TABS
100.0000 mg | ORAL_TABLET | Freq: Three times a day (TID) | ORAL | Status: DC
  Administered 2017-05-06 – 2017-05-10 (×14): 100 mg via ORAL
  Filled 2017-05-06 (×14): qty 2

## 2017-05-06 MED ORDER — FUROSEMIDE 80 MG PO TABS
80.0000 mg | ORAL_TABLET | Freq: Every day | ORAL | Status: DC
  Administered 2017-05-06: 80 mg via ORAL
  Filled 2017-05-06: qty 1

## 2017-05-06 MED ORDER — FENTANYL CITRATE (PF) 100 MCG/2ML IJ SOLN
75.0000 ug | Freq: Once | INTRAMUSCULAR | Status: AC
  Administered 2017-05-06: 75 ug via INTRAVENOUS

## 2017-05-06 MED ORDER — LIDOCAINE HCL 2 % EX GEL
CUTANEOUS | Status: AC
  Filled 2017-05-06: qty 20

## 2017-05-06 NOTE — Consult Note (Addendum)
WOC Nurse wound follow up Requested to re-assess skin; appearance checked with [name], NP at the bedside.  Buttocks are red, moist and painful; affected area with full thickness wounds are approx 16X10X.2 cm, involving bilat buttocks and inner gluteal cleft. Silver nitrate applied to decrease bleeding.  Pt is still incontinent of stool and it is difficult to keep buttocks from becoming soiled.  He is on an air bed to optimize air flow to the affected locations.  Barrier cream to protect and promote healing and repel moisture.  Inner groin has improved slightly with use of Interdry silver-impregnated fabric.  Remains with full thickness wound to scrotum, approx 6X3X.2cm, yellow, moist and painful. Inner groin areas with previously red macerated skin and partial thickness skin loss has improved.  All blisters related to previous antibiotics are beginning to dry and heal.  Continue to protect with foam dressings to promote healing. Pt could benefit from use of a foley short term to avoid urinary incontinence on the wound to the scrotum. Discussed plan of care with primary team and patient's wife, she verbalized understanding. Please re-consult if further assistance is needed.  Thank-you,  Cammie Mcgee MSN, RN, CWOCN, [place], CNS [phone]

## 2017-05-06 NOTE — Progress Notes (Signed)
Admitted 2018-06-11due to suspected GIB due to black, tarry stools.   Septic/PNA- antibiotics stopped on 5/29.   HeartMate II LVAD implanted on 5/2013by [facility].   Vital signs: HR: 88 Doppler Pressure: 88 Automatic BP:  Not able to perform O2 Sat: 96 on RA Wt:88.1 kg>88.9 > 90kg >91> 92>93kg> 97.5  LVAD interrogation reveals:  Speed: 9400 Flow: 6.5 Power: 7w PI: 3.9 Alarms: none Events: 3-5 PI events daily Fixed speed: 9400 Low speed limit: 8800  Drive Line: Weekly. Done today by wife.  Gtts:  Amiodarone 30 mg/hr - started 04/30/17, stopped 6/2   Labs:  LDH trend:143>137>200>131>139>129>129>125>128>137  INR trend: 4.12>3.94>3.99>1.77>1.79>2.19>2.27>2.15>2.37>2.60  Anticoagulation Plan: -INR Goal: 2-3 -ASA Dose: none due to GIB history  Adverse Events on VAD: -07/2013>GIB  Plan/Recommendations:   1. Weekly dressing changes. Please allow wife to perform today.   Marcellus Scott RN, VAD Coordinator 24/7 pager [phone]

## 2017-05-06 NOTE — Progress Notes (Signed)
CSW continuing to follow for placement needs when appropriate- pt has bed offers at [facility] and [facility] which are both VAD capable facilities  Burna Sis, LCSW Clinical Social Worker [phone]

## 2017-05-06 NOTE — Consult Note (Signed)
Urology Consult  Consulting MD: Bensimhon  CC: Difficulty urinating  HPI: This is a 76 year old male in cardiac unit with CHF/peropheral edema. Catheter needed for following I/os as well as difficulty urinating. GU consult requested for catheter placement.  No prior GU hx of note.  PMH: Past Medical History:  Diagnosis Date  . Allergy   . Anemia   . Anemia of chronic renal failure, stage 4 (severe) (HCC) 02/11/2017  . Angina   . Anxiety   . Blood transfusion   . CAD (coronary artery disease)   . Cataract   . CHF (congestive heart failure) (HCC)   . Clotting disorder (HCC)   . COPD (chronic obstructive pulmonary disease) (HCC)   . Diabetes mellitus   . DVT of lower extremity (deep venous thrombosis) (HCC)    left  . Gout   . H/O hiatal hernia   . Heart murmur   . High cholesterol   . Hypertension   . Hypotension    ORTHOSTATIC  . Iron deficiency anemia due to chronic blood loss 02/11/2017  . Jaundice    "@ birth & when I was in 7th grade"  . Malabsorption of iron 02/11/2017  . Mitral valve disorder   . Myocardial infarction (HCC) 1988  . Neuromuscular disorder (HCC)   . Osteoporosis   . Pneumonia 1993; 2009   "both serious cases"  . Pneumonia 2010   "not hospitalized"  . Pulmonary embolism on left (HCC) 1988  . 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 Shortness of breath on exertion   . Sleep apnea   . Stomach ulcer 1987-1988  . Stroke [facility]) 1987   "brain stem; skin sensitive since then; once in awhile slur"12/13/11)    PSH: Past Surgical History:  Procedure Laterality Date  . BIV  11/2010   ICD 12/11 MEDTRONIC  . CARDIAC CATHETERIZATION  12/13/11  . CATARACT EXTRACTION W/ INTRAOCULAR LENS  IMPLANT, BILATERAL  1990's  . CORONARY ANGIOPLASTY  1988  . CORONARY ANGIOPLASTY WITH STENT PLACEMENT  2003   "1"  . EXPLORATORY LAPAROTOMY W/ BOWEL RESECTION  1993  . HIP FRACTURE SURGERY Right 2014  . IMPLANTABLE CARDIOVERTER DEFIBRILLATOR GENERATOR CHANGE   08/19/13   MDT Coletta Memos CRT-D generator change by Dr Wilford Grist at [facility].  RV lead with chronically elevated threshold and low R waves not revised.  LV lead with chronic diaphrgramtic stim turned off at [facility].  . leff ventricular assist device (LVAD)  05/02/12   LVAD implanted at [facility]  . RIGHT HEART CATHETERIZATION N/A 12/13/2011   Procedure: RIGHT HEART CATH;  Surgeon: Dolores Patty, MD;  Location[facility] C[facility] LAB;  Service: Cardiovascular;  Laterality: N/A;  . RIGHT HEART CATHETERIZATION N/A 02/08/2012   Procedure: RIGHT HEART CATH;  Surgeon: Dolores Patty, MD;  Location: [facility] CATH LAB;  Service: Cardiovascular;  Laterality: N/A;  . TRACHEOSTOMY  1993    Allergies: Allergies  Allergen Reactions  . Amoxicillin-Pot Clavulanate Rash  . Vancomycin Hives    Red rash with blisters/sloughing  . Levofloxacin Rash    Doesn't tolerate  . Oxycodone Hives  . Ace Inhibitors Rash  . Ceftriaxone Rash  . Cefuroxime Rash  . Chlorhexidine Gluconate Rash    H/o recurrent cellulitis and rash   . Clindamycin/Lincomycin Rash    Erythematous drug eruption  . Hydralazine Itching  . Meropenem Rash  . Sulfonamide Derivatives Rash    Mother was highly allergic. Patient untested.   [name] Zosyn [  Piperacillin Sod-Tazobactam So] Rash    No other systemic symptoms    Medications: Prescriptions Prior to Admission  Medication Sig Dispense Refill Last Dose  . acetaminophen (TYLENOL) 500 MG tablet Take 1,000 mg by mouth every 6 (six) hours as needed.    04/22/2017 at Unknown time  . albuterol (PROVENTIL HFA;VENTOLIN HFA) 108 (90 BASE) MCG/ACT inhaler Inhale 1-2 puffs into the lungs as needed for wheezing or shortness of breath.   04/22/2017 at Unknown time  . budesonide-formoterol (SYMBICORT) 160-4.5 MCG/ACT inhaler Inhale 2 puffs into the lungs 2 (two) times daily.    04/11/2017 at Unknown time  . calcium carbonate (OS-CAL) 600 MG TABS tablet Take 1,200 mg by mouth 2 (two) times daily.    04/16/2017 at Unknown time  .  Cholecalciferol (VITAMIN D3) 2000 UNITS TABS Take 1 tablet by mouth daily.   04/04/2017 at Unknown time  . colchicine 0.6 MG tablet Take 0.6 mg by mouth daily.   04/12/2017 at Unknown time  . Darbepoetin Alfa (ARANESP) 100 MCG/0.5ML SOSY injection Inject into the skin. Every three week last one May 11 th next on due  June 1st   Taking  . diclofenac sodium (VOLTAREN) 1 % GEL Apply topically.   Past Month at Unknown time  . doxycycline (VIBRA-TABS) 100 MG tablet Take 1 tablet (100 mg total) by mouth 2 (two) times daily. 14 tablet 0 04/27/2017 at Unknown time  . ferrous sulfate 325 (65 FE) MG tablet Take 325 mg by mouth.    04/21/2017 at Unknown time  . hydroxychloroquine (PLAQUENIL) 200 MG tablet Take by mouth daily. 2 tabs with breakfast   04/28/2017 at Unknown time  . Melatonin 3-10 MG TABS Take 5 mg by mouth at bedtime as needed.    04/22/2017 at Unknown time  . metoprolol tartrate (LOPRESSOR) 25 MG tablet Take 25 mg by mouth 2 (two) times daily.   04/22/2017 at Unknown time  . Mirtazapine (REMERON PO) Take 15 mg by mouth at bedtime.    Past Month at Unknown time  . Multiple Vitamin (MULTIVITAMIN) tablet Take 1 tablet by mouth daily.   04/25/2017 at Unknown time  . pantoprazole (PROTONIX) 40 MG tablet Take 40 mg by mouth 2 (two) times daily.    04/22/2017 at Unknown time  . pravastatin (PRAVACHOL) 40 MG tablet Take one by mouth daily   04/22/2017 at Unknown time  . predniSONE (DELTASONE) 10 MG tablet Take 10 mg by mouth as needed.    04/22/2017 at Unknown time  . tamsulosin (FLOMAX) 0.4 MG CAPS capsule Take 0.4 mg by mouth daily.   Past Week at Unknown time  . torsemide (DEMADEX) 20 MG tablet Take 20 mg by mouth every morning. May take an extra tab in the evening as needed.   04/14/2017 at Unknown time  . ULORIC 80 MG TABS Take 80 mg by mouth daily.    04/07/2017 at Unknown time  . warfarin (COUMADIN) 2 MG tablet Take 1-3 mg by mouth as directed. Managed by [facility] Patient checks at home   04/22/2017 at Unknown  time     Social History: Social History   Social History  . Marital status: Married    Spouse name: N/A  . Number of children: N/A  . Years of education: N/A   Occupational History  . Not on file.   Social History Main Topics  . Smoking status: Former Smoker    Packs/day: 1.00    Years: 30.00    Types: Cigarettes  Quit date: 12/03/1986  . Smokeless tobacco: Never Used  . Alcohol use No  . Drug use: No  . Sexual activity: No   Other Topics Concern  . Not on file   Social History Narrative  . No narrative on file    Family History: Family History  Problem Relation Age of Onset  . Cancer Mother   . Heart disease Mother   . Cancer Father   . Prostate cancer Brother     Review of Systems: Positive: SOB, difficulty urinatin, LE/genital swelling, skin irritastion Negative:  A further 10 point review of systems was negative except what is listed in the HPI.  Physical Exam: @VITALS2 @ General: Mild distress.  Awake, anxious Head:  Normocephalic.  Atraumatic. ENT:  EOMI.  Mucous membranes moist Neck:  Supple.  No lymphadenopathy. Pulmonary: Equal effort bilaterally.  . Abdomen: Soft.  Non tender to palpation. Skin:  Normal turgor.   Extremity: No gross deformity of bilateral upper extremities.  No gross deformity of bilateral lower extremities. Neurologic: Alert,anxious  Penis:  Uncircumcised.  No lesions. Signifiacant edema Urethra:   Orthotopic meatus. Scrotum: Edematous, lower scrotal erythema Testicles: Descended bilaterally.  No masses bilaterally. Epididymis: Palpable bilaterally.  Non Tender to palpation.  Studies:  Recent Labs     05/05/17  0411  05/06/17  0351  HGB  8.1*  8.0*  WBC  6.6  7.9  PLT  249  243    Recent Labs     05/04/17  0500  05/06/17  0351  NA  137  138  K  4.9  5.0  CL  106  106  CO2  26  26  BUN  60*  62*  CREATININE  1.76*  1.89*  CALCIUM  7.5*  7.5*  GFRNONAA  35*  32*  GFRAA  41*  38*     Recent Labs      05/05/17  0411  05/06/17  0351  INR  2.37  2.60     Invalid input(s): ABG  Penis compressed for 5 mins to decrease edema. Penis prepped/draped, 16 fr coude catheter placed. 10 cc in balloon. Hooked to BSB.  Assessment:  Urinary retention, genital edema  Plan: Remove catheter at your leisure. Reconsult prn    Pager:[phone]

## 2017-05-06 NOTE — Progress Notes (Signed)
ANTICOAGULATION CONSULT NOTE - Follow Up Consult  Pharmacy Consult for Warfarin Indication: LVAD  Allergies  Allergen Reactions  . Amoxicillin-Pot Clavulanate Rash  . Vancomycin Hives    Red rash with blisters/sloughing  . Levofloxacin Rash    Doesn't tolerate  . Oxycodone Hives  . Ace Inhibitors Rash  . Ceftriaxone Rash  . Cefuroxime Rash  . Chlorhexidine Gluconate Rash    H/o recurrent cellulitis and rash   . Clindamycin/Lincomycin Rash    Erythematous drug eruption  . Hydralazine Itching  . Meropenem Rash  . Sulfonamide Derivatives Rash    Mother was highly allergic. Patient untested.   [name] Zosyn [Piperacillin Sod-Tazobactam So] Rash    No other systemic symptoms    Patient Measurements: Height: 5\' 7"  (170.2 cm) Weight: 215 lb (97.5 kg) IBW/kg (Calculated) : 66.1  Vital Signs: Temp: 98.2 F (36.8 C) (06/04 0800) Temp Source: Oral (06/04 0800) Pulse Rate: 72 (06/04 0800)  Labs:  Recent Labs  05/04/17 0500 05/05/17 0411 05/06/17 0351  HGB 8.4* 8.1* 8.0*  HCT 29.1* 27.6* 27.4*  PLT 278 249 243  LABPROT 24.3* 26.3* 28.3*  INR 2.15 2.37 2.60  CREATININE 1.76*  --  1.89*    Estimated Creatinine Clearance: 35.9 mL/min (A) (by C-G formula based on SCr of 1.89 mg/dL (H)).  Assessment: 76 yo male with LVAD on chronic warfarin.  Admitted with supratherapeutic INR of 4.12 and GIB. Warfarin was held 5/22-5/27 and restarted 5/28.   INR is now above goal at 2.6. Hgb low but stable. LDH stable. No obvious bleeding. Continues on amiodarone which is now PO. Also receiving ensure supplements which contain vitamin k. Pre-albumin low 7.3.   PTA Warfarin dose 2 mg daily.  Per patient report was recently decreased from 2 mg daily except 3 mg on MWF. No aspirin.  Goal of Therapy:  INR 1.7-2.3 (per [facility]) Monitor platelets by anticoagulation protocol: Yes   Plan:  1) Hold warfarin tonight 2) Daily INR, CBC, LDH  Louie Casa, PharmD, BCPS 05/06/2017 10:59  AM

## 2017-05-06 NOTE — Progress Notes (Signed)
Skin assessment is very complex.  Wound care done this morning with CHF team and wound care at bedside.  Bath done as well at this time.  Please see wound care note for further skin assessment details.

## 2017-05-06 NOTE — Progress Notes (Signed)
I continue to follow pt's progress with therapies and tolerance for more intense therapies. [phone]

## 2017-05-06 NOTE — Progress Notes (Signed)
Skin assessment is again very complex. Open serous blisters with pink foam. Interdry dressing around scrotum and perineum. Please see WOC Nurse note for further skin assessment details.

## 2017-05-06 NOTE — Progress Notes (Signed)
Physical Therapy Treatment Patient Details Name: [name] MRN: [id number] DOB: [birth year] Today's Date: 05/06/2017    History of Present Illness Hayden Paolo [name] a 76 y.o.malewith CAD with PCI, ICM, afib, hx of CVA in 1987, hx of DVT/PE, HTN, hyperlipidemia, CKD III, and chronic systolic HF. Pt had [name] implant at [facility] on 04/2012. Admitted from [facility] 05-16-2017 with black, tarry stools and Hgb 6.6.    PT Comments    Pt admitted with above diagnosis. Pt currently with functional limitations due to balance and endurance deficits as well as pain. Pt was able to transfer to chair with min assist and agreed to sit up 1 hour.  PT agreed to come back and assist back to bed.   Pt will benefit from skilled PT to increase their independence and safety with mobility to allow discharge to the venue listed below.     Follow Up Recommendations  CIR;Supervision/Assistance - 24 hour     Equipment Recommendations  None recommended by PT    Recommendations for Other Services Rehab consult     Precautions / Restrictions Precautions Precautions: Fall Precaution Comments: LVAD Restrictions Weight Bearing Restrictions: No    Mobility  Bed Mobility Overal bed mobility: Needs Assistance Bed Mobility: Rolling;Supine to Sit;Sit to Supine Rolling: Max assist;+2 for physical assistance   Supine to sit: HOB elevated;Max assist;+2 for physical assistance     General bed mobility comments: Increased time and effort, physical assist to elevate to upright and position at EOB  Transfers Overall transfer level: Needs assistance Equipment used: Rolling walker (2 wheeled) Transfers: Sit to/from [employer] Sit to Stand: Mod assist;From elevated surface;+2 physical assistance Stand pivot transfers: Min assist;+2 physical assistance (face to face)       General transfer comment: pt able to stand from elevated bed with mod assist of 1 person with cues for anterior  translation and assist to extend hips and trunk in standing. Able to take pivotal steps to chair with min assist and cues.  Assist to move RW.   Pt had BM prior to sitting down on pad and took total assist to clean pt prior to him sitting down.   Ambulation/Gait                 Stairs            Wheelchair Mobility    Modified Rankin (Stroke Patients Only)       Balance Overall balance assessment: Needs assistance Sitting-balance support: Feet supported;Bilateral upper extremity supported Sitting balance-Leahy Scale: Poor Sitting balance - Comments: patient with poor sitting balance due to pain in buttocks Postural control: Posterior lean Standing balance support: Bilateral upper extremity supported;During functional activity Standing balance-Leahy Scale: Poor Standing balance comment: required mod assist of 2 to achieve and min assist to maintain standing                            Cognition Arousal/Alertness: Awake/alert Behavior During Therapy: Flat affect Overall Cognitive Status: Within Functional Limits for tasks assessed                                 General Comments: pt followed commands       Exercises      General Comments General comments (skin integrity, edema, etc.): Geomat under pt and pt to sit up one hour.  Pertinent Vitals/Pain Pain Assessment: Faces Faces Pain Scale: Hurts worst Pain Location: buttocks wounds/ blisters Pain Descriptors / Indicators: Grimacing;Guarding;Sharp;Sore Pain Intervention(s): Limited activity within patient's tolerance;Monitored during session;Premedicated before session;Repositioned;Patient requesting pain meds-RN notified  VSS with a few PVCs while transferring but nurse in room and aware.   Home Living                      Prior Function            PT Goals (current goals can now be found in the care plan section) Progress towards PT goals: Progressing toward  goals    Frequency    Min 3X/week      PT Plan Current plan remains appropriate    Co-evaluation              AM-PAC PT "6 Clicks" Daily Activity  Outcome Measure  Difficulty turning over in bed (including adjusting bedclothes, sheets and blankets)?: Total Difficulty moving from lying on back to sitting on the side of the bed? : Total Difficulty sitting down on and standing up from a chair with arms (e.g., wheelchair, bedside commode, etc,.)?: A Lot Help needed moving to and from a bed to chair (including a wheelchair)?: A Lot Help needed walking in hospital room?: A Lot Help needed climbing 3-5 steps with a railing? : Total 6 Click Score: 9    End of Session Equipment Utilized During Treatment: Gait belt;Oxygen Activity Tolerance: Patient limited by fatigue (limited by buttock pain) Patient left: with call bell/phone within reach;in chair;with nursing/sitter in room Nurse Communication: Mobility status;Precautions PT Visit Diagnosis: Difficulty in walking, not elsewhere classified (R26.2)     Time: 9244-6286 PT Time Calculation (min) (ACUTE ONLY): 23 min  Charges:  $Therapeutic Activity: 8-22 mins $Self Care/Home Management: 8-22                    G Codes:       [name],PT Acute Rehabilitation [phone] (pager)    [name] 05/06/2017, 3:40 PM

## 2017-05-06 NOTE — Progress Notes (Signed)
05/06/17 1542  PT Visit Information  Last PT Received On 05/06/17  Assistance Needed +2  History of Present Illness Hayden Andres [name] a 76 y.o.malewith CAD with PCI, ICM, afib, hx of CVA in 1987, hx of DVT/PE, HTN, hyperlipidemia, CKD III, and chronic systolic HF. Pt had [name] implant at [facility] on 04/2012. Admitted from [facility] 04/09/2017 with black, tarry stools and Hgb 6.6.  Precautions  Precautions Fall  Precaution Comments LVAD  Restrictions  Weight Bearing Restrictions No  Pain Assessment  Pain Assessment Faces  Faces Pain Scale 10  Pain Location buttocks wounds/ blisters  Pain Descriptors / Indicators Grimacing;Guarding;Sharp;Sore  Pain Intervention(s) Limited activity within patient's tolerance;Monitored during session;Premedicated before session;Repositioned;Patient requesting pain meds-RN notified  Cognition  Arousal/Alertness Awake/alert  Behavior During Therapy Flat affect  Overall Cognitive Status Within Functional Limits for tasks assessed  General Comments pt followed commands   Bed Mobility  Overal bed mobility Needs Assistance  Bed Mobility Rolling;Supine to Sit;Sit to Supine  Rolling Max assist;+2 for physical assistance  Sit to supine Max assist;+2 for physical assistance  General bed mobility comments Increased time and effort, physical assist to assist LEs into bed   Transfers  Overall transfer level Needs assistance  Equipment used Rolling walker (2 wheeled);Ambulation equipment used  Transfer via Psychologist, prison and probation services Sit to/from [employer] Transfers  Sit to Stand From elevated surface;+2 physical assistance;Total assist;Max assist  Stand pivot transfers +2 physical assistance;Total assist;From elevated surface  General transfer comment pt unable to stand from recliner even with total assist 2 people.  Obtained Stedy and pt was able to stand enough with mod to max assist to get pads behind him.   Used Stedy to move pt to bed.  Pt  exhausted and max assist to stand to get pads moved to get pt back to bed.  Max assist to extend trunk and hips in standing.    Balance  Overall balance assessment Needs assistance  Sitting-balance support Feet supported;Bilateral upper extremity supported  Sitting balance-Leahy Scale Poor  Sitting balance - Comments patient with poor sitting balance due to pain in buttocks  Postural control Posterior lean  Standing balance support Bilateral upper extremity supported;During functional activity  Standing balance-Leahy Scale Poor  Standing balance comment required max assist of 2 to achieve and mod assist to maintain standing; had to use STedy as pt too fatigued to stand enough for RW.   PT - End of Session  Equipment Utilized During Treatment Gait belt;Oxygen  Activity Tolerance Patient limited by fatigue (limited by buttock pain)  Patient left with call bell/phone within reach;with nursing/sitter in room;in bed  Nurse Communication Mobility status;Precautions  PT - Assessment/Plan  PT Plan Current plan remains appropriate  PT Visit Diagnosis Difficulty in walking, not elsewhere classified (R26.2)  PT Frequency (ACUTE ONLY) Min 3X/week  Recommendations for Other Services Rehab consult  Follow Up Recommendations CIR;Supervision/Assistance - 24 hour  PT equipment None recommended by PT  AM-PAC PT "6 Clicks" Daily Activity Outcome Measure  Difficulty turning over in bed (including adjusting bedclothes, sheets and blankets)? 1  Difficulty moving from lying on back to sitting on the side of the bed?  1  Difficulty sitting down on and standing up from a chair with arms (e.g., wheelchair, bedside commode, etc,.)? 2  Help needed moving to and from a bed to chair (including a wheelchair)? 2  Help needed walking in hospital room? 2  Help needed climbing 3-5 steps with a railing?  1  6 Click Score 9  Mobility G Code  CL  PT Goal Progression  Progress towards PT goals Not progressing toward goals  - comment (extremely fatigued after sitting up in chair one hour)  PT Time Calculation  PT Start Time (ACUTE ONLY) 1231  PT Stop Time (ACUTE ONLY) 1300  PT Time Calculation (min) (ACUTE ONLY) 29 min  PT General Charges  $$ ACUTE PT VISIT 1 Procedure  PT Treatments  $Therapeutic Activity 23-37 mins  Pt was very fatigued and needed incr assist to get back into bed.  Had to use STedy with max assist to get pt in [place].  Will continue to progress pt as pt able.  Pt with low tolerance for activity.  All VSS.  Will continue PT.   Thanks.  [facility] Acute Rehabilitation [phone] (pager)

## 2017-05-06 NOTE — Progress Notes (Addendum)
Patient ID: [name], male   DOB: 08/27/1941, 76 y.o.   MRN: [id number]   Advanced Heart Failure VAD Team Note  Subjective:    Admitted 04/25/2017 with black, tarry stools and Hgb 6.6.  Septic/PNA- antibiotics stopped on 5/29.    Complaining of buttock pain. Poor appetite.   LVAD INTERROGATION:  HeartMate II LVAD:  Flow 6.6 liters/min, speed 9400, power 7 PI 3.9 Rare PI events.     Objective:    Vital Signs:   Temp:  [98.3 F (36.8 C)-98.8 F (37.1 C)] 98.3 F (36.8 C) (06/04 0300) Pulse Rate:  [48-130] 72 (06/04 0800) Resp:  [8-27] 8 (06/04 0800) BP: (79)/(61) 79/61 (06/03 2006) SpO2:  [89 %-100 %] 96 % (06/04 0859) Weight:  [215 lb (97.5 kg)] 215 lb (97.5 kg) (06/04 0300) Last BM Date: 05/04/17 Mean arterial Pressure 70-80s  Intake/Output:   Intake/Output Summary (Last 24 hours) at 05/06/17 0921 Last data filed at 05/06/17 0800  Gross per 24 hour  Intake                0 ml  Output             1120 ml  Net            -1120 ml    Physical Exam: CVP 6  GENERAL: Chronically ill. In bed. No acute distress. HEENT: normal  NECK: Supple, JVP 6-7 .  2+ bilaterally, no bruits.  No lymphadenopathy or thyromegaly appreciated.   CARDIAC:  Mechanical heart sounds with LVAD hum present.  LUNGS:  Clear to auscultation bilaterally.  ABDOMEN:  Soft, round, nontender, positive bowel sounds x4.     LVAD exit site: well-healed and incorporated.  Dressing dry and intact.  No erythema or drainage.  Stabilization device present and accurately applied.  Driveline dressing is being changed daily per sterile technique. EXTREMITIES:  Warm and dry, no cyanosis, clubbing, rash or edema . RUE PICC  NEUROLOGIC:  Alert and oriented x 4.  Gait steady.  No aphasia.  No dysarthria.  Affect flat   SKIN: multiple areas of partial thickness skin loss buttocks, groins, trunck    Telemetry:  Afib 70-80s PVCs personally reviewed. .   Labs: Basic Metabolic Panel:  Recent Labs Lab  04/30/17 1612 05/01/17 0354 05/01/17 1208 05/02/17 0500 05/03/17 0438 05/04/17 0500 05/06/17 0351  NA 136 136 137 138 137 137 138  K 5.1 5.3* 5.0 4.8 4.7 4.9 5.0  CL 109 108 108 107 106 106 106  CO2 22 23 23 24 25 26 26   GLUCOSE 101* 94 129* 124* 129* 138* 88  BUN 50* 53* 51* 53* 55* 60* 62*  CREATININE 1.55* 1.67* 1.71* 1.78* 1.78* 1.76* 1.89*  CALCIUM 8.1* 8.0* 7.9* 7.6* 7.5* 7.5* 7.5*  MG 2.1 2.6*  --   --   --   --   --     Liver Function Tests:  Recent Labs Lab 05/01/17 1208  AST 20  ALT 17  ALKPHOS 105  BILITOT 0.6  PROT 4.6*  ALBUMIN 1.6*   No results for input(s): LIPASE, AMYLASE in the last 168 hours. No results for input(s): AMMONIA in the last 168 hours.  CBC:  Recent Labs Lab 05/01/17 0354 05/02/17 0855 05/03/17 0438 05/04/17 0500 05/05/17 0411 05/06/17 0351  WBC 11.2* 8.4 7.4 8.3 6.6 7.9  NEUTROABS 9.0*  --   --   --   --   --   HGB 8.9* 8.3* 7.9* 8.4* 8.1* 8.0*  HCT 29.7* 28.5* 27.1* 29.1* 27.6* 27.4*  MCV 94.3 94.1 94.1 93.6 93.2 93.8  PLT 305 300 300 278 249 243    INR:  Recent Labs Lab 05/02/17 0500 05/03/17 0438 05/04/17 0500 05/05/17 0411 05/06/17 0351  INR 2.19 2.27 2.15 2.37 2.60    Other results:   Imaging: No results found.   Medications:     Scheduled Medications: . calcium carbonate  1 tablet Oral BID WC  . cholecalciferol  1,000 Units Oral Daily  . citalopram  10 mg Oral Daily  . febuxostat  40 mg Oral Daily  . feeding supplement (ENSURE ENLIVE)  237 mL Oral BID BM  . ferrous sulfate  325 mg Oral Q breakfast  . furosemide  40 mg Intravenous BID  . hydroxychloroquine  400 mg Oral QPC breakfast  . mirtazapine  7.5 mg Oral QHS  . mometasone-formoterol  2 puff Inhalation BID  . pantoprazole  40 mg Oral BID  . pravastatin  40 mg Oral Daily  . sodium chloride flush  10-40 mL Intracatheter Q12H  . tamsulosin  0.4 mg Oral Daily  . Warfarin - Pharmacist Dosing Inpatient   Does not apply q1800     Infusions:   PRN Medications: acetaminophen, albuterol, diphenhydrAMINE, fentaNYL (SUBLIMAZE) injection, hydrOXYzine, Melatonin, ondansetron (ZOFRAN) IV, sodium chloride flush   Assessment/Plan   1. 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 Noted to have functional SBO with 3 retained capsule cameras.  Has had multiple GI work-ups at [facility]. Would not repeat scope unless rebleeds. -- Has had multiple transfusions this admission.  -- Received another unit of PRBCs 6/1 Hgb 7.9->8.4>8.1>8.0 Check iron stores in 1 month.  Continue 325 mg iron daily.   2. Acute on chronic systolic CHF due to ICM s/p LVAD 04/2012 at [facility] for DT.   -- CVP 6. Stop IV lasix. Start 80 mg po lasix daily.  3. Chronic anticoagulation for LVAD/afib:  --Goal INR 1.7-2.3, no ASA. -- INR 2.6  Continue coumadin. d/w PharmD personally this am  4. Chronic Afib:  --On coumadin. Rate controlled.   5. Shock Septic with productive cough: suspect HCAP.  CXR with L basilar airspace opacity.  PCT falling,  Off pressors.  WBC 7.9   -- Antibiotics stopped 5/29. 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 He has had 7 days of antibiotics.  6. AKI on CKD IV:  --Creatinine 1.89. Stable.     7. 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  -- Appreciate WOC recs: Continue silicone dressing to extremities would not remove for 5 days. I removed R forearm dressing as these have healed.  Stop xeroform and apply Gerhardts Butt cream to perianal skin loss.  Continue interdry to groin hopefully with foley this will decrease moisture.  Continue low air loss overload and reposition R to L only.  8. Depression --encouraged to participate in care.  9. Hyperkalemia  - K 5.0  Not on K or spiro   10. NSVT - improved. Can stop amio   11. Severe Malnutrition - Nutrition following. Prealbumin 7.3. Needs to continue eat high protein foods.  12. Urinary Incontinence- Groin buttocks frequently moist due to urinary and fecal incontinence. Extensive partial thickness skin loss to penis, scrotum, and groin. Place short term foley.  I reviewed the LVAD parameters from today, and compared the results to the patient's prior recorded data.  No programming changes were made.  The LVAD is functioning within specified parameters.  The nurse performs LVAD self-test daily.  LVAD interrogation was negative for any significant power changes, alarms or PI events/speed drops.  LVAD equipment check completed and is in good working order.  Back-up equipment present.   LVAD education done on emergency procedures and precautions and reviewed exit site care.  Disposition: Limited by skin issues. He will likely need SNF.   Length of Stay: 13  [name], NP 05/06/2017, 9:21 AM  VAD Team --- VAD ISSUES ONLY--- Pager [phone] (7am - 7am)  Advanced Heart Failure Team  Pager [phone] (M-F; 7a - 4p)  Please contact [facility] for night-coverage after hours (4p -7a ) and weekends on [email or web address]   Patient seen and examined with [name], NP. We discussed all aspects of the encounter. I agree with the assessment and plan as stated above.   Continues with severe skin breakdown and pain. Patient seen together this am with wound care team. Adjustments made. Have asked Urology to place Foley (nursing team unable to get catheter in this am) to help avoid further skin breakdown.   Volume status improved with IV lasix. Will switch to po. K high will follow.   Continue to stress need to participate in rehab/IS as well as eat high protein foods.   VAD parameters reviewed personally and remain stable.   Arvilla Meres, MD  1:28 PM

## 2017-05-07 LAB — BASIC METABOLIC PANEL
Anion gap: 6 (ref 5–15)
BUN: 64 mg/dL — AB (ref 6–20)
CHLORIDE: 102 mmol/L (ref 101–111)
CO2: 28 mmol/L (ref 22–32)
CREATININE: 1.93 mg/dL — AB (ref 0.61–1.24)
Calcium: 7.6 mg/dL — ABNORMAL LOW (ref 8.9–10.3)
GFR calc Af Amer: 37 mL/min — ABNORMAL LOW (ref >60)
GFR calc non Af Amer: 32 mL/min — ABNORMAL LOW (ref >60)
Glucose, Bld: 83 mg/dL (ref 65–99)
Potassium: 4.4 mmol/L (ref 3.5–5.1)
SODIUM: 136 mmol/L (ref 135–145)

## 2017-05-07 LAB — CBC
HCT: 27.1 % — ABNORMAL LOW (ref 39.0–52.0)
HEMOGLOBIN: 7.9 g/dL — AB (ref 13.0–17.0)
MCH: 27.1 pg (ref 26.0–34.0)
MCHC: 29.2 g/dL — AB (ref 30.0–36.0)
MCV: 93.1 fL (ref 78.0–100.0)
Platelets: 242 10*3/uL (ref 150–400)
RBC: 2.91 MIL/uL — ABNORMAL LOW (ref 4.22–5.81)
RDW: 19.6 % — ABNORMAL HIGH (ref 11.5–15.5)
WBC: 7.9 10*3/uL (ref 4.0–10.5)

## 2017-05-07 LAB — PROTIME-INR
INR: 2.33
PROTHROMBIN TIME: 25.9 s — AB (ref 11.4–15.2)

## 2017-05-07 LAB — LACTATE DEHYDROGENASE: LDH: 137 U/L (ref 98–192)

## 2017-05-07 MED ORDER — WARFARIN SODIUM 2 MG PO TABS
1.0000 mg | ORAL_TABLET | Freq: Once | ORAL | Status: AC
  Administered 2017-05-07: 1 mg via ORAL
  Filled 2017-05-07: qty 0.5

## 2017-05-07 NOTE — Progress Notes (Signed)
Skin assessment is complex.  Pink foams over open serous blisters.  New blister on rt. posterior rib cage, pink foam applied. Bleeding sores around anus cleansed and silver nitrate applied. Gerhardt's butt cream applied in butt crack.  Interdry dressing was changed around perineum and scrotum.  This area was cleansed as well and foley care preformed.  Pt. Premedicated with 25 mcg of fentanyl pre wound care.  ACE bandages applied to lower legs bilaterally to help get fluid off. See WOC nurse note for further details.

## 2017-05-07 NOTE — Care Management Note (Signed)
Case Management Note Donn Pierini RN, BSN Unit 2W-Case Manager-- 2H coverage [phone]  Patient Details  Name: Kalim Picone MRN: [id number] Date of Birth: 01-09-1941  Subjective/Objective:  LVAD pt admitted with symptomatic anemia- hgb 6.6 - transfused 2 U- hgb now 8.2-                    Action/Plan: PTA pt lived at home with wife- anticipate return home- CM to follow for d/c needs  Expected Discharge Date:                  Expected Discharge Plan:  Skilled Nursing Facility  In-House Referral:  Clinical Social Work  Discharge planning Services  CM Consult  Post Acute Care Choice:    Choice offered to:     DME Arranged:    DME Agency:     HH Arranged:    HH Agency:     Status of Service:  In process, will continue to follow  If discussed at Long Length of Stay Meetings, dates discussed:  6/5  Discharge Disposition:   Additional Comments:  05/07/17- 1245- Donn Pierini RN, CM-  Noted MD note regarding skin - Extensive partial thickness skin loss to penis, scrotum, and groin.- very weak and edematous-  Remains very tenuous- most likely will need SNF- will have CSW follow.   Darrold Span, RN 05/07/2017, 12:47 PM

## 2017-05-07 NOTE — Progress Notes (Signed)
ANTICOAGULATION CONSULT NOTE - Follow Up Consult  Pharmacy Consult for Warfarin Indication: LVAD  Allergies  Allergen Reactions  . Amoxicillin-Pot Clavulanate Rash  . Vancomycin Hives    Red rash with blisters/sloughing  . Levofloxacin Rash    Doesn't tolerate  . Oxycodone Hives  . Ace Inhibitors Rash  . Ceftriaxone Rash  . Cefuroxime Rash  . Chlorhexidine Gluconate Rash    H/o recurrent cellulitis and rash   . Clindamycin/Lincomycin Rash    Erythematous drug eruption  . Hydralazine Itching  . Meropenem Rash  . Sulfonamide Derivatives Rash    Mother was highly allergic. Patient untested.   [name] Zosyn [Piperacillin Sod-Tazobactam So] Rash    No other systemic symptoms    Patient Measurements: Height: 5\' 7"  (170.2 cm) Weight: 209 lb 3.2 oz (94.9 kg) IBW/kg (Calculated) : 66.1  Vital Signs: Temp: 97.8 F (36.6 C) (06/05 0700) Temp Source: Oral (06/05 0700) Pulse Rate: 74 (06/05 0800)  Labs:  Recent Labs  05/05/17 0411 05/06/17 0351 05/07/17 0349  HGB 8.1* 8.0* 7.9*  HCT 27.6* 27.4* 27.1*  PLT 249 243 242  LABPROT 26.3* 28.3* 25.9*  INR 2.37 2.60 2.33  CREATININE  --  1.89* 1.93*    Estimated Creatinine Clearance: 34.6 mL/min (A) (by C-G formula based on SCr of 1.93 mg/dL (H)).  Assessment: 76 yo male with LVAD on chronic warfarin.  Admitted with supratherapeutic INR of 4.12 and GIB. Warfarin was held 5/22-5/27 and restarted 5/28.   INR is back within goal at 2.33 after holding dose yesterday.. Hgb low but stable. LDH stable. No obvious bleeding. Continues on amiodarone which is now PO. Also receiving ensure supplements which contain vitamin k. Pre-albumin low 7.3.   PTA Warfarin dose 2 mg daily.  Per patient report was recently decreased from 2 mg daily except 3 mg on MWF. No aspirin.  Goal of Therapy:  INR 1.7-2.3 (per [facility]) Monitor platelets by anticoagulation protocol: Yes   Plan:  1) Coumadin 1mg  tonight 2) Daily INR, CBC, LDH  Louie Casa,  PharmD, BCPS 05/07/2017 9:59 AM

## 2017-05-07 NOTE — Progress Notes (Signed)
Turner MD paged regarding patient's AM Hgb of 7.9. No new orders received. Will continue to monitor patient.   Marlou Porch

## 2017-05-07 NOTE — Progress Notes (Signed)
Patient ID: [name], male   DOB: [birth year], 76 y.o.   MRN: [id number]   Advanced Heart Failure VAD Team Note  Subjective:    Admitted 05/15/2017 with black, tarry stools and Hgb 6.6.  Septic/PNA- antibiotics stopped on 5/29.    Feeling tired this am.  Appetite slowly improving. Able to get IS up to 850. Skin stil swelling with occasional blisters. Denies SOB.   LVAD INTERROGATION:  HeartMate II LVAD:  Flow 6.2 liters/min, speed 9400, power 6, PI 4.7. Occasional PI events.   Objective:    Vital Signs:   Temp:  [97.4 F (36.3 C)-98.4 F (36.9 C)] 97.8 F (36.6 C) (06/05 0700) Pulse Rate:  [62-112] 74 (06/05 0800) Resp:  [7-23] 10 (06/05 0800) SpO2:  [85 %-100 %] 95 % (06/05 0800) Weight:  [209 lb 3.2 oz (94.9 kg)] 209 lb 3.2 oz (94.9 kg) (06/05 0358) Last BM Date: 05/06/17 Mean arterial Pressure 70-80s  Intake/Output:   Intake/Output Summary (Last 24 hours) at 05/07/17 0900 Last data filed at 05/07/17 0800  Gross per 24 hour  Intake              240 ml  Output             1925 ml  Net            -1685 ml    Physical Exam: CVP 5 GENERAL: Chronically ill, elderly, and fatigued. NAD.  HEENT: Normal. NECK: Supple, JVP 6-7 cm. Carotids OK.  CARDIAC:  Mechanical heart sounds with LVAD hum present.  LUNGS:  CTAB, normal effort.  ABDOMEN:  NT, ND, no HSM. No bruits or masses. +BS  LVAD exit site: Well-healed and incorporated. Dressing dry and intact. No erythema or drainage. Stabilization device present and accurately applied. Driveline dressing changed daily per sterile technique. EXTREMITIES:  Warm and dry. No cyanosis or clubbing.  Small blisters on ankle and near elbows.  3+ BLE doughy edema.  NEUROLOGIC:  Alert & oriented x 3. Cranial nerves grossly intact. Moves all 4 extremities w/o difficulty. Affect pleasant    SKIN: Multiple areas of partial thickness skin loss to buttocks, groin, and trunk.   Telemetry:  Personally reviewed, Afib 70-80s with occasional  PVCs.   Labs: Basic Metabolic Panel:  Recent Labs Lab 04/30/17 1612 05/01/17 0354  05/02/17 0500 05/03/17 0438 05/04/17 0500 05/06/17 0351 05/07/17 0349  NA 136 136  < > 138 137 137 138 136  K 5.1 5.3*  < > 4.8 4.7 4.9 5.0 4.4  CL 109 108  < > 107 106 106 106 102  CO2 22 23  < > 24 25 26 26 28   GLUCOSE 101* 94  < > 124* 129* 138* 88 83  BUN 50* 53*  < > 53* 55* 60* 62* 64*  CREATININE 1.55* 1.67*  < > 1.78* 1.78* 1.76* 1.89* 1.93*  CALCIUM 8.1* 8.0*  < > 7.6* 7.5* 7.5* 7.5* 7.6*  MG 2.1 2.6*  --   --   --   --   --   --   < > = values in this interval not displayed.  Liver Function Tests:  Recent Labs Lab 05/01/17 1208  AST 20  ALT 17  ALKPHOS 105  BILITOT 0.6  PROT 4.6*  ALBUMIN 1.6*   No results for input(s): LIPASE, AMYLASE in the last 168 hours. No results for input(s): AMMONIA in the last 168 hours.  CBC:  Recent Labs Lab 05/01/17 0354  05/03/17 0438 05/04/17 0500  05/05/17 0411 05/06/17 0351 05/07/17 0349  WBC 11.2*  < > 7.4 8.3 6.6 7.9 7.9  NEUTROABS 9.0*  --   --   --   --   --   --   HGB 8.9*  < > 7.9* 8.4* 8.1* 8.0* 7.9*  HCT 29.7*  < > 27.1* 29.1* 27.6* 27.4* 27.1*  MCV 94.3  < > 94.1 93.6 93.2 93.8 93.1  PLT 305  < > 300 278 249 243 242  < > = values in this interval not displayed.  INR:  Recent Labs Lab 05/03/17 0438 05/04/17 0500 05/05/17 0411 05/06/17 0351 05/07/17 0349  INR 2.27 2.15 2.37 2.60 2.33    Other results:   Imaging: No results found.   Medications:     Scheduled Medications: . calcium carbonate  1 tablet Oral BID WC  . cholecalciferol  1,000 Units Oral Daily  . citalopram  10 mg Oral Daily  . febuxostat  40 mg Oral Daily  . feeding supplement (ENSURE ENLIVE)  237 mL Oral BID BM  . ferrous sulfate  325 mg Oral Q breakfast  . furosemide  80 mg Oral Daily  . hydroxychloroquine  400 mg Oral QPC breakfast  . mirtazapine  7.5 mg Oral QHS  . mometasone-formoterol  2 puff Inhalation BID  . pantoprazole  40 mg  Oral BID  . pravastatin  40 mg Oral Daily  . sodium chloride flush  10-40 mL Intracatheter Q12H  . tamsulosin  0.4 mg Oral Daily  . traMADol  100 mg Oral Q8H  . Warfarin - Pharmacist Dosing Inpatient   Does not apply q1800    Infusions:   PRN Medications: acetaminophen, albuterol, diphenhydrAMINE, fentaNYL (SUBLIMAZE) injection, hydrOXYzine, Melatonin, ondansetron (ZOFRAN) IV, sodium chloride flush   Assessment/Plan   1. 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 Noted to have functional SBO with 3 retained capsule cameras.  Has had multiple GI work-ups at [facility]. Would not repeat scope unless rebleeds. -- Has had multiple transfusions this admission.  -- Received another unit of PRBCs 6/1 Hgb 7.9->8.4>8.1>8.0>7.9. Continue to follow.  Check iron stores in 1 month.  Continue 325 mg iron daily.   2. Acute on chronic systolic CHF due to ICM s/p LVAD 04/2012 at [facility] for DT.   - CVP 5. With AKI will hold lasix today.  - Wrap legs with 2 + edema.  3. Chronic anticoagulation for LVAD/afib:  --Goal INR 1.7-2.3, no ASA. -- INR 2.33  Continue coumadin. d/w PharmD personally this am  4. Chronic Afib:  - On coumadin. Rate controlled.   5. Shock Septic with productive cough: suspect HCAP.  CXR with L basilar airspace opacity.  PCT falling,  Off pressors.  WBC 7.9   -- Antibiotics stopped 5/29. 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 He has had 7 days of antibiotics.  6. AKI on CKD IV:  - Creatinine 1.9.   Hold lasix.  7. 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 Continue silicone dressing to extremities would not remove for 5 days. Removed R forearm dressing as these have healed.  Stop xeroform and apply Gerhardts Butt cream to perianal skin loss.  Continue interdry to groin  hopefully with foley this will decrease moisture.  Continue low air loss overload and reposition  R to L only.  8. Depression - Needs to participate in care.   9. Hyperkalemia  - K 4.4.. Not on K or spiro.  10. NSVT - Mostly quiescent off amio, but with occasional 3-4 beat run.  11. Severe Malnutrition - Nutrition following. Prealbumin 7.3. Continue high protein foods.  12. Urinary Incontinence- Groin buttocks frequently moist due to urinary and fecal incontinence. Extensive partial thickness skin loss to penis, scrotum, and groin.  - Short term foley placed by urology 05/06/17.  Disposition: Will likely need SNF.   I reviewed the LVAD parameters from today, and compared the results to the patient's prior recorded data.  No programming changes were made.  The LVAD is functioning within specified parameters.  The nurse performs LVAD self-test daily.  LVAD interrogation was negative for any significant power changes, alarms or PI events/speed drops.  LVAD equipment check completed and is in good working order.  Back-up equipment present.   LVAD education done on emergency procedures and precautions and reviewed exit site care.  Length of Stay: [id number]  Luane Hamilton 05/07/2017, 8:59 AM  VAD Team --- VAD ISSUES ONLY--- Pager [phone] (7am - 7am)  Advanced Heart Failure Team  Pager [phone] (M-F; 7a - 4p)  Please contact [facility] for night-coverage after hours (4p -7a ) and weekends on [email or web address]  Patient seen and examined with the above-signed Advanced Practice Provider and/or Housestaff. I personally reviewed laboratory data, imaging studies and relevant notes. I independently examined the patient and formulated the important aspects of the plan. I have edited the note to reflect any of my changes or salient points. I have personally discussed the plan with the patient and/or family.  He remains very tenuous. Foley placed last night for painful urination and retention. Very  weak and edematous though CVP only 5 and creatinine now climbing. Skin healing slowly but more blisters coming up from edema. Trying to eat more. VAD parameters stable. HGb drifting down slowly again.   Hold lasix today. Wrap legs with ACE wraps. Encourage po intake and IS. Continue to work with PT. Can transfuse again as needed.   Continue Foley for now.   Arvilla Meres, MD  9:21 AM

## 2017-05-08 LAB — CBC
HCT: 26 % — ABNORMAL LOW (ref 39.0–52.0)
HCT: 29.6 % — ABNORMAL LOW (ref 39.0–52.0)
HEMOGLOBIN: 7.7 g/dL — AB (ref 13.0–17.0)
HEMOGLOBIN: 8.7 g/dL — AB (ref 13.0–17.0)
MCH: 27.8 pg (ref 26.0–34.0)
MCH: 27.6 pg (ref 26.0–34.0)
MCHC: 29.6 g/dL — ABNORMAL LOW (ref 30.0–36.0)
MCHC: 29.4 g/dL — AB (ref 30.0–36.0)
MCV: 93.9 fL (ref 78.0–100.0)
MCV: 94 fL (ref 78.0–100.0)
PLATELETS: 227 10*3/uL (ref 150–400)
Platelets: 232 10*3/uL (ref 150–400)
RBC: 2.77 MIL/uL — AB (ref 4.22–5.81)
RBC: 3.15 MIL/uL — ABNORMAL LOW (ref 4.22–5.81)
RDW: 19.4 % — ABNORMAL HIGH (ref 11.5–15.5)
RDW: 18.7 % — ABNORMAL HIGH (ref 11.5–15.5)
WBC: 6.7 10*3/uL (ref 4.0–10.5)
WBC: 7.1 10*3/uL (ref 4.0–10.5)

## 2017-05-08 LAB — BASIC METABOLIC PANEL
Anion gap: 4 — ABNORMAL LOW (ref 5–15)
BUN: 59 mg/dL — ABNORMAL HIGH (ref 6–20)
CHLORIDE: 101 mmol/L (ref 101–111)
CO2: 30 mmol/L (ref 22–32)
CREATININE: 1.76 mg/dL — AB (ref 0.61–1.24)
Calcium: 7.9 mg/dL — ABNORMAL LOW (ref 8.9–10.3)
GFR calc non Af Amer: 35 mL/min — ABNORMAL LOW (ref >60)
GFR, EST AFRICAN AMERICAN: 41 mL/min — AB (ref >60)
Glucose, Bld: 84 mg/dL (ref 65–99)
POTASSIUM: 5.1 mmol/L (ref 3.5–5.1)
SODIUM: 135 mmol/L (ref 135–145)

## 2017-05-08 LAB — PROTIME-INR
INR: 2
PROTHROMBIN TIME: 23 s — AB (ref 11.4–15.2)

## 2017-05-08 LAB — LACTATE DEHYDROGENASE: LDH: 126 U/L (ref 98–192)

## 2017-05-08 LAB — PREPARE RBC (CROSSMATCH)

## 2017-05-08 MED ORDER — WHITE PETROLATUM GEL
Status: AC
  Administered 2017-05-08: 10:00:00
  Filled 2017-05-08: qty 1

## 2017-05-08 MED ORDER — SODIUM CHLORIDE 0.9 % IV SOLN
Freq: Once | INTRAVENOUS | Status: AC
  Administered 2017-05-08: 14:00:00 via INTRAVENOUS

## 2017-05-08 MED ORDER — WARFARIN SODIUM 2 MG PO TABS
2.0000 mg | ORAL_TABLET | Freq: Once | ORAL | Status: AC
  Administered 2017-05-08: 2 mg via ORAL
  Filled 2017-05-08: qty 1

## 2017-05-08 NOTE — Progress Notes (Signed)
Physical Therapy Treatment Patient Details Name: [name] MRN: [id number] DOB: Sep 15, 1941 Today's Date: 05/08/2017    History of Present Illness [name] [name] a 76 y.o.malewith CAD with PCI, ICM, afib, hx of CVA in 1987, hx of DVT/PE, HTN, hyperlipidemia, CKD III, and chronic systolic HF. Pt had [name] implant at [facility] on 04/2012. Admitted from [facility] 04/27/2017 with black, tarry stools and Hgb 6.6.    PT Comments    Patient session focused on bed mobility, OOB sit <> stands, and therapeutic exercise. Patient remains limited by pain at this time. Will continue to see and progress as tolerated.   Follow Up Recommendations  CIR;Supervision/Assistance - 24 hour     Equipment Recommendations  None recommended by PT    Recommendations for Other Services Rehab consult     Precautions / Restrictions Precautions Precautions: Fall Precaution Comments: LVAD    Mobility  Bed Mobility Overal bed mobility: Needs Assistance Bed Mobility: Rolling;Supine to Sit;Sit to Supine Rolling: Max assist;+2 for physical assistance     Sit to supine: Max assist;+2 for physical assistance   General bed mobility comments: Increased time and effort, physical assist to assist LEs into bed   Transfers Overall transfer level: Needs assistance Equipment used: 2 person hand held assist Transfers: Sit to/from Stand;Stand Pivot Transfers Sit to Stand: From elevated surface;+2 physical assistance;Total assist;Max assist Stand pivot transfers: +2 physical assistance;Total assist;From elevated surface       General transfer comment: performed x3 during session with focus on LE power up for strengthening  Ambulation/Gait             General Gait Details: deferred at this time   Stairs            Wheelchair Mobility    Modified Rankin (Stroke Patients Only)       Balance Overall balance assessment: Needs assistance Sitting-balance support: Feet supported;Bilateral  upper extremity supported Sitting balance-Leahy Scale: Poor Sitting balance - Comments: patient with poor sitting balance due to pain in buttocks Postural control: Posterior lean Standing balance support: Bilateral upper extremity supported;During functional activity Standing balance-Leahy Scale: Poor Standing balance comment: required max assist of 2 to achieve and mod assist to maintain standing; had to use STedy as pt too fatigued to stand enough for RW.                             Cognition Arousal/Alertness: Awake/alert Behavior During Therapy: Flat affect Overall Cognitive Status: Within Functional Limits for tasks assessed                                        Exercises Other Exercises Other Exercises: BLE AROM SLRs with limited ROM x4 each leg Other Exercises: active trunk control work EOB ~ 5 minutes during rest breaks    General Comments        Pertinent Vitals/Pain Pain Assessment: Faces Faces Pain Scale: Hurts worst Pain Location: buttocks Pain Descriptors / Indicators: Grimacing;Guarding;Sharp;Sore Pain Intervention(s): Limited activity within patient's tolerance;Premedicated before session    Home Living                      Prior Function            PT Goals (current goals can now be found in the care plan section) Acute Rehab PT  Goals Patient Stated Goal: to beat this thing PT Goal Formulation: With patient/family Time For Goal Achievement: 05/01/17 Potential to Achieve Goals: Good Progress towards PT goals: Not progressing toward goals - comment (limited by pain)    Frequency    Min 3X/week      PT Plan Current plan remains appropriate    Co-evaluation              AM-PAC PT "6 Clicks" Daily Activity  Outcome Measure  Difficulty turning over in bed (including adjusting bedclothes, sheets and blankets)?: Total Difficulty moving from lying on back to sitting on the side of the bed? :  Total Difficulty sitting down on and standing up from a chair with arms (e.g., wheelchair, bedside commode, etc,.)?: A Lot Help needed moving to and from a bed to chair (including a wheelchair)?: A Lot Help needed walking in hospital room?: A Lot Help needed climbing 3-5 steps with a railing? : Total 6 Click Score: 9    End of Session Equipment Utilized During Treatment: Gait belt;Oxygen Activity Tolerance: Patient limited by fatigue (limited by buttock pain) Patient left: with call bell/phone within reach;with nursing/sitter in room;in bed Nurse Communication: Mobility status;Precautions PT Visit Diagnosis: Difficulty in walking, not elsewhere classified (R26.2)     Time: 3785-8850 PT Time Calculation (min) (ACUTE ONLY): 25 min  Charges:  $Therapeutic Activity: 23-37 mins                    G Codes:       Charlotte Crumb, PT DPT  [phone]    Fabio Asa 05/08/2017, 12:05 PM

## 2017-05-08 NOTE — Progress Notes (Signed)
LVAD Coordinator inpatient rounding note:  Admitted 5/22/18due to suspected GIB due to black, tarry stools.   Septic/PNA- antibiotics stopped on 5/29.   HeartMate II LVAD implanted on 5/2013by [facility].   Vital signs: HR: 66 Doppler Pressure: 74 Automatic BP: Not able to perform O2 Sat: 98 on 4 LNC Wt:88.1 kg>88.9 > 90kg >91> 92>93kg> 97.5>97  LVAD interrogation reveals:  Speed: 9400 Flow: 6.0 Power: 6 PI: 5 Alarms: none Events:multiple PI events daily Fixed speed: 9400 Low speed limit: 8800  Drive Line: Weekly. Performed by wife.  Gtts:  Amiodarone 30 mg/hr - started 04/30/17, stopped 6/2   Labs:  LDH trend:143>137>200>131>139>129>129>125>128>137>137>126  INR trend: 4.12>3.94>3.99>1.77>1.79>2.19>2.27>2.15>2.37>2.60>2.33>2.00  Anticoagulation Plan: -INR Goal: 2-3 -ASA Dose: none due to GIB history  Adverse Events on VAD: -07/2013>GIB  Plan/Recommendations:   1. Weekly dressing changes. Next due 6/11. 2. Will likely need SNF placement.   Marcellus Scott RN, VAD Coordinator 24/7 pager [phone]

## 2017-05-08 NOTE — Progress Notes (Signed)
Nutrition Follow-up  INTERVENTION:   Encourage intake at meals  Continue Ensure Enlive po BID, each supplement provides 350 kcal and 20 grams of protein  NUTRITION DIAGNOSIS:   Inadequate oral intake related to acute illness as evidenced by meal completion < 25%. Ongoing.   GOAL:   Patient will meet greater than or equal to 90% of their needs Progressing.   MONITOR:   PO intake, Supplement acceptance, Labs, Weight trends  ASSESSMENT:   76 y.o. male with CAD with PCI, ICM, afib, hx of CVA in 1987, hx of DVT/PE, HTN, hyperlipidemia, CKD III, and chronic systolic HF. Pt had [name] implant at [facility] on 04/2012.  Admitted from [facility] 04/22/2017 with black, tarry stools and Hgb 6.6. Pt s/p capsule endoscopy at [facility] in 11/2016; noted to have 1 AVM in the duodenum.   Meal completion documentation spotty but meals recorded are 25-100% Pain limited intake  Medications reviewed and include: oscal, vitamin D, ferrous sulfate, remeron Labs reviewed: BUN/Cr 59/1.76 elevated but decreasing, hemoglobin 7.7 (L) Weight up 17 lb, pt is positive 3 L since admission, + edema  Diet Order:  Diet regular Room service appropriate? Yes; Fluid consistency: Thin  Skin:   Per WOC 6/4: non-pressure wound R/L buttocks and inner gluteal cleft, full thickness wound to scrotum Inner groin areas has improved and all blisters are beginning to dry and heal  Last BM:  6/5 small  Height:   Ht Readings from Last 1 Encounters:  04/24/17 5\' 7"  (1.702 m)    Weight:   Wt Readings from Last 1 Encounters:  05/08/17 213 lb 14.4 oz (97 kg)    Ideal Body Weight:  67.2 kg  BMI:  Body mass index is 33.5 kg/m.  Estimated Nutritional Needs:   Kcal:  1800-2100kcal/day   Protein:  91-109g/day   Fluid:  >1.8L/day  EDUCATION NEEDS:   Education needs addressed  Kendell Bane RD, LDN, CNSC [phone] Pager [phone] After Hours Pager

## 2017-05-08 NOTE — Progress Notes (Signed)
Patient ID: [name], male   DOB: 11/09/1941, 76 y.o.   MRN: [id number]   Advanced Heart Failure VAD Team Note  Subjective:    Admitted 05-10-17 with black, tarry stools and Hgb 6.6.  Septic/PNA- antibiotics stopped on 5/29.    Hgb 8.1 -> 8.0 -> 7.9 -> 7.7  Feeling a little better today. Having very slight nose bleed, but also scratching his nares due to 02/dryness. Small brown BM yesterday. No overt bleeding.   LVAD INTERROGATION:  HeartMate II LVAD:  Flow 6.1 liters/min, speed 9400, power 6, PI 4.4. 3-4 PI events daily.   Objective:    Vital Signs:   Temp:  [97.7 F (36.5 C)-98.2 F (36.8 C)] 98.2 F (36.8 C) (06/06 0400) Pulse Rate:  [47-88] 66 (06/06 0700) Resp:  [8-22] 10 (06/06 0700) SpO2:  [83 %-100 %] 99 % (06/06 0700) Weight:  [213 lb 14.4 oz (97 kg)] 213 lb 14.4 oz (97 kg) (06/06 0500) Last BM Date: 05/06/17 Mean arterial Pressure 70-80s   Intake/Output:   Intake/Output Summary (Last 24 hours) at 05/08/17 0806 Last data filed at 05/08/17 0626  Gross per 24 hour  Intake              730 ml  Output             1025 ml  Net             -295 ml    Physical Exam: CVP 9 GENERAL: Chronically ill, elderly, and fatigued appearing. NAD.  HEENT: Normal. NECK: Supple, JVP ~9-10 cm. Carotids OK.  CARDIAC:  Mechanical heart sounds with LVAD hum present.  LUNGS:  CTAB, normal effort.  ABDOMEN:  NT, ND, no HSM. No bruits or masses. +BS  LVAD exit site: Well-healed and incorporated. Dressing dry and intact. No erythema or drainage. Stabilization device present and accurately applied. Driveline dressing changed daily per sterile technique. EXTREMITIES:  Warm and dry. No cyanosis or clubbing.  Legs wrapped with ace bandage. 2-3+ doughy edema.   NEUROLOGIC:  Alert & oriented x 3. Cranial nerves grossly intact. Moves all 4 extremities w/o difficulty. Affect pleasant    SKIN: Multiple areas of partial thickness skin loss to buttocks, groin, and trunk.   Telemetry:   Personally reviewed, Afib 60-80s with occasional PVCs.   Labs: Basic Metabolic Panel:  Recent Labs Lab 05/03/17 0438 05/04/17 0500 05/06/17 0351 05/07/17 0349 05/08/17 0358  NA 137 137 138 136 135  K 4.7 4.9 5.0 4.4 5.1  CL 106 106 106 102 101  CO2 25 26 26 28 30   GLUCOSE 129* 138* 88 83 84  BUN 55* 60* 62* 64* 59*  CREATININE 1.78* 1.76* 1.89* 1.93* 1.76*  CALCIUM 7.5* 7.5* 7.5* 7.6* 7.9*    Liver Function Tests:  Recent Labs Lab 05/01/17 1208  AST 20  ALT 17  ALKPHOS 105  BILITOT 0.6  PROT 4.6*  ALBUMIN 1.6*   No results for input(s): LIPASE, AMYLASE in the last 168 hours. No results for input(s): AMMONIA in the last 168 hours.  CBC:  Recent Labs Lab 05/04/17 0500 05/05/17 0411 05/06/17 0351 05/07/17 0349 05/08/17 0358  WBC 8.3 6.6 7.9 7.9 6.7  HGB 8.4* 8.1* 8.0* 7.9* 7.7*  HCT 29.1* 27.6* 27.4* 27.1* 26.0*  MCV 93.6 93.2 93.8 93.1 93.9  PLT 278 249 243 242 227    INR:  Recent Labs Lab 05/04/17 0500 05/05/17 0411 05/06/17 0351 05/07/17 0349 05/08/17 0358  INR 2.15 2.37 2.60 2.33 2.00  Other results:   Imaging: No results found.   Medications:     Scheduled Medications: . calcium carbonate  1 tablet Oral BID WC  . cholecalciferol  1,000 Units Oral Daily  . citalopram  10 mg Oral Daily  . febuxostat  40 mg Oral Daily  . feeding supplement (ENSURE ENLIVE)  237 mL Oral BID BM  . ferrous sulfate  325 mg Oral Q breakfast  . hydroxychloroquine  400 mg Oral QPC breakfast  . mirtazapine  7.5 mg Oral QHS  . mometasone-formoterol  2 puff Inhalation BID  . pantoprazole  40 mg Oral BID  . pravastatin  40 mg Oral Daily  . sodium chloride flush  10-40 mL Intracatheter Q12H  . tamsulosin  0.4 mg Oral Daily  . traMADol  100 mg Oral Q8H  . Warfarin - Pharmacist Dosing Inpatient   Does not apply q1800    Infusions:   PRN Medications: acetaminophen, albuterol, diphenhydrAMINE, fentaNYL (SUBLIMAZE) injection, hydrOXYzine, Melatonin,  ondansetron (ZOFRAN) IV, sodium chloride flush   Assessment/Plan   1. 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 Noted to have functional SBO with 3 retained capsule cameras.  Has had multiple GI work-ups at [facility]. Would not repeat scope unless rebleeds. -- Has had multiple transfusions this admission.  -- Received another unit of PRBCs 6/1 Hgb 7.9->8.4>8.1>8.0>7.9 > 7.7. Will give 1 additional unit.  Check iron stores end of June.  Continue 325 mg iron daily.   2. Acute on chronic systolic CHF due to ICM s/p LVAD 04/2012 at [facility] for DT.   - CVP ~9. Creatinine coming down.  Suspect would tolerate at least po lasix this am. Will discuss dosing with MD.  ? One dose of 40 mg IV lasix with peripheral edema.  - Wrap legs with 2 + edema.  3. Chronic anticoagulation for LVAD/afib:  --Goal INR 1.7-2.3, no ASA. -- INR 2.00. Continue coumadin. D/w PharmD personally this am. Slight drop from holding 2 days ago. Also got two supplement shakes yesterday ( High Vit K) 4. Chronic Afib:  - On coumadin. Rate controlled.    5. Shock Septic with productive cough: suspect HCAP.  CXR with L basilar airspace opacity.  PCT falling,  Off pressors.  WBC 6.7 -- Antibiotics stopped 5/29. 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 He has had 7 days of antibiotics.  6. AKI on CKD IV:  - Creatinine 1.76.    7. 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 Continue silicone dressing to extremities would not remove for 5 days. Removed R forearm dressing as these have healed.  Continue Gerhardts Butt cream to perianal skin loss.  Continue interdry to groin hopefully with foley this will decrease moisture.  Continue low air loss overload and reposition R to L only.  - BLE legs wrapped with Ace bandages. Elevated heels  to prevent pressure ulcers. Change daily.  8. Depression - On-going. Encouraged to participate in care.  9. Hyperkalemia  - K 5.1. Not on any potassium sparing meds.  - ? If need to consider veltassa moving forward, though not on any anti-hypertensives, so likely just follow for now.  10. NSVT - Mostly quiescent off amio, but with occasional 3-4 beat run. No change.  11. Severe Malnutrition - Nutrition following. Prealbumin 7.3.  Continue high protein foods. No change.  12. Urinary Incontinence- Groin buttocks frequently moist due to urinary and fecal incontinence. Extensive partial thickness skin loss to penis, scrotum, and groin.  - Short term foley placed by urology 05/06/17. No change.   Disposition: Will likely need SNF.   I reviewed the LVAD parameters from today, and compared the results to the patient's prior recorded data.  No programming changes were made.  The LVAD is functioning within specified parameters.  The patient performs LVAD self-test daily.  LVAD interrogation was negative for any significant power changes, alarms or PI events/speed drops.  LVAD equipment check completed and is in good working order.  Back-up equipment present.   LVAD education done on emergency procedures and precautions and reviewed exit site care.  Length of Stay: [address]  Luane School 05/08/2017, 8:06 AM  VAD Team --- VAD ISSUES ONLY--- Pager [phone] (7am - 7am)  Advanced Heart Failure Team  Pager [phone] (M-F; 7a - 4p)  Please contact [facility] for night-coverage after hours (4p -7a ) and weekends on [email or web address]  Patient seen and examined with the above-signed Advanced Practice Provider and/or Housestaff. I personally reviewed laboratory data, imaging studies and relevant notes. I independently examined the patient and formulated the important aspects of the plan. I have edited the note to reflect any of my changes or salient points. I have personally discussed the plan with the  patient and/or family.  Remains very weak. Unable to stand. Continues to complain of pain from wounds. Hgb drifting down.   Will transfuse 1u RBCs. Continue aggressive wound care and pulmonary toilet. Will continue to work with PT.   CVP going back up. Renal function improved. Resume lasix tomorrow.   VAD parameters stable.   Continue Foley for now due to skin breakdown.  Arvilla Meres, MD  4:36 PM

## 2017-05-08 NOTE — Progress Notes (Signed)
ANTICOAGULATION CONSULT NOTE - Follow Up Consult  Pharmacy Consult for Warfarin Indication: LVAD  Allergies  Allergen Reactions  . Amoxicillin-Pot Clavulanate Rash  . Vancomycin Hives    Red rash with blisters/sloughing  . Levofloxacin Rash    Doesn't tolerate  . Oxycodone Hives  . Ace Inhibitors Rash  . Ceftriaxone Rash  . Cefuroxime Rash  . Chlorhexidine Gluconate Rash    H/o recurrent cellulitis and rash   . Clindamycin/Lincomycin Rash    Erythematous drug eruption  . Hydralazine Itching  . Meropenem Rash  . Sulfonamide Derivatives Rash    Mother was highly allergic. Patient untested.   [name] Zosyn [Piperacillin Sod-Tazobactam So] Rash    No other systemic symptoms    Patient Measurements: Height: 5\' 7"  (170.2 cm) Weight: 213 lb 14.4 oz (97 kg) IBW/kg (Calculated) : 66.1  Vital Signs: Temp: 98.3 F (36.8 C) (06/06 0800) Temp Source: Oral (06/06 0800) Pulse Rate: 61 (06/06 0800)  Labs:  Recent Labs  05/06/17 0351 05/07/17 0349 05/08/17 0358  HGB 8.0* 7.9* 7.7*  HCT 27.4* 27.1* 26.0*  PLT 243 242 227  LABPROT 28.3* 25.9* 23.0*  INR 2.60 2.33 2.00  CREATININE 1.89* 1.93* 1.76*    Estimated Creatinine Clearance: 38.4 mL/min (A) (by C-G formula based on SCr of 1.76 mg/dL (H)).  Assessment: 76 yo male with LVAD on chronic warfarin.  Admitted with supratherapeutic INR of 4.12 and GIB. Warfarin was held 5/22-5/27 and restarted 5/28.   INR therapeutic at 2 today. Hgb low but stable. LDH stable. No obvious bleeding. Continues on amiodarone which is now PO. Also receiving ensure supplements which contain vitamin k. Pre-albumin low 7.3.   PTA Warfarin dose 2 mg daily.  Per patient report was recently decreased from 2 mg daily except 3 mg on MWF. No aspirin.  Goal of Therapy:  INR 1.7-2.3 (per [facility]) Monitor platelets by anticoagulation protocol: Yes   Plan:  1) Warfarin 2mg  tonight 2) Daily INR, CBC, LDH  Louie Casa, PharmD, BCPS 05/08/2017 9:01  AM

## 2017-05-09 DIAGNOSIS — Z7189 Other specified counseling: Secondary | ICD-10-CM

## 2017-05-09 DIAGNOSIS — L511 Stevens-Johnson syndrome: Secondary | ICD-10-CM

## 2017-05-09 DIAGNOSIS — Z515 Encounter for palliative care: Secondary | ICD-10-CM

## 2017-05-09 DIAGNOSIS — E43 Unspecified severe protein-calorie malnutrition: Secondary | ICD-10-CM

## 2017-05-09 DIAGNOSIS — I5023 Acute on chronic systolic (congestive) heart failure: Secondary | ICD-10-CM

## 2017-05-09 LAB — BPAM RBC
Blood Product Expiration Date: 201806202359
ISSUE DATE / TIME: 201806061500
UNIT TYPE AND RH: 6200

## 2017-05-09 LAB — BASIC METABOLIC PANEL
Anion gap: 6 (ref 5–15)
BUN: 60 mg/dL — ABNORMAL HIGH (ref 6–20)
CALCIUM: 8.1 mg/dL — AB (ref 8.9–10.3)
CO2: 29 mmol/L (ref 22–32)
CREATININE: 1.79 mg/dL — AB (ref 0.61–1.24)
Chloride: 98 mmol/L — ABNORMAL LOW (ref 101–111)
GFR calc non Af Amer: 35 mL/min — ABNORMAL LOW (ref >60)
GFR, EST AFRICAN AMERICAN: 40 mL/min — AB (ref >60)
Glucose, Bld: 79 mg/dL (ref 65–99)
Potassium: 5.4 mmol/L — ABNORMAL HIGH (ref 3.5–5.1)
Sodium: 133 mmol/L — ABNORMAL LOW (ref 135–145)

## 2017-05-09 LAB — TYPE AND SCREEN
ABO/RH(D): A POS
ANTIBODY SCREEN: NEGATIVE
Unit division: 0

## 2017-05-09 LAB — CBC
HCT: 29.4 % — ABNORMAL LOW (ref 39.0–52.0)
Hemoglobin: 8.6 g/dL — ABNORMAL LOW (ref 13.0–17.0)
MCH: 27.5 pg (ref 26.0–34.0)
MCHC: 29.3 g/dL — AB (ref 30.0–36.0)
MCV: 93.9 fL (ref 78.0–100.0)
PLATELETS: 244 10*3/uL (ref 150–400)
RBC: 3.13 MIL/uL — ABNORMAL LOW (ref 4.22–5.81)
RDW: 18.8 % — AB (ref 11.5–15.5)
WBC: 7.6 10*3/uL (ref 4.0–10.5)

## 2017-05-09 LAB — PROTIME-INR
INR: 1.73
PROTHROMBIN TIME: 20.5 s — AB (ref 11.4–15.2)

## 2017-05-09 LAB — LACTATE DEHYDROGENASE: LDH: 154 U/L (ref 98–192)

## 2017-05-09 MED ORDER — FUROSEMIDE 10 MG/ML IJ SOLN
80.0000 mg | Freq: Two times a day (BID) | INTRAMUSCULAR | Status: DC
  Administered 2017-05-09 (×2): 80 mg via INTRAVENOUS
  Filled 2017-05-09 (×3): qty 8

## 2017-05-09 MED ORDER — GERHARDT'S BUTT CREAM
TOPICAL_CREAM | Freq: Three times a day (TID) | CUTANEOUS | Status: DC
  Administered 2017-05-09 (×2): via TOPICAL
  Administered 2017-05-09: 1 via TOPICAL
  Administered 2017-05-10: 09:00:00 via TOPICAL
  Filled 2017-05-09: qty 1

## 2017-05-09 MED ORDER — WARFARIN SODIUM 3 MG PO TABS
3.0000 mg | ORAL_TABLET | Freq: Once | ORAL | Status: AC
  Administered 2017-05-09: 3 mg via ORAL
  Filled 2017-05-09: qty 1

## 2017-05-09 MED ORDER — FLUCONAZOLE IN SODIUM CHLORIDE 400-0.9 MG/200ML-% IV SOLN
400.0000 mg | INTRAVENOUS | Status: DC
  Administered 2017-05-09 – 2017-05-13 (×5): 400 mg via INTRAVENOUS
  Filled 2017-05-09 (×5): qty 200

## 2017-05-09 NOTE — Progress Notes (Signed)
Physical Therapy Treatment Patient Details Name: [name] MRN: [id number] DOB: [birth year] Today's Date: 05/09/2017    History of Present Illness Hayden Eggum [name] a 76 y.o.malewith CAD with PCI, ICM, afib, hx of CVA in 1987, hx of DVT/PE, HTN, hyperlipidemia, CKD III, and chronic systolic HF. Pt had [name] implant at [facility] on 04/2012. Admitted from [facility] 04/12/2017 with black, tarry stools and Hgb 6.6. Pt developed sepsis/PNA. Had severe skin reaction to antibiotics.    PT Comments    Pt making slow progress. Was able to amb a very short (4') distance today with 2 person assist. At this point I don't think pt can tolerated intensity of CIR and likely will need SNF. However if pt's activity tolerance begins to improve markedly, then maybe it would become more likely. Wife present for treatment. Wife appeared surprised by how much assist pt is requiring for mobility.  Follow Up Recommendations  SNF (unless his activity tolerance improves and then possibly CIR)     Equipment Recommendations  None recommended by PT    Recommendations for Other Services OT consult     Precautions / Restrictions Precautions Precautions: Fall Precaution Comments: LVAD Restrictions Weight Bearing Restrictions: No    Mobility  Bed Mobility Overal bed mobility: Needs Assistance Bed Mobility: Supine to Sit     Supine to sit: +2 for physical assistance;Mod assist;HOB elevated     General bed mobility comments: Assist to bring legs off bed, elevate trunk into sitting and bring hips to EOB.  Transfers Overall transfer level: Needs assistance Equipment used: Ambulation equipment used;Rolling walker (2 wheeled) Transfers: Sit to/from [employer] Transfers Sit to Stand: From elevated surface;+2 physical assistance;Max assist Stand pivot transfers: +2 physical assistance;Max assist (with Stedy)       General transfer comment: Heavy assist to bring hips and trunk up. Pt with  posterior bias. Used bed pad to bring hips up. Stood from elevated bed with Stedy and used Stedy for pivot to chair. Stood from Medical illustrator with walker. Had pt place hands on walker to come to stand to encourage anterior weight shift.  Ambulation/Gait Ambulation/Gait assistance: +2 physical assistance;Mod assist Ambulation Distance (Feet): 4 Feet Assistive device: Rolling walker (2 wheeled) Gait Pattern/deviations: Step-to pattern;Decreased step length - right;Decreased step length - left;Shuffle;Trunk flexed Gait velocity: decr Gait velocity interpretation: Below normal speed for age/gender General Gait Details: Pt with great difficulty advancing feet. Eventually able to take very short shuffling steps without feet ever fully clearing the floor   Stairs            Wheelchair Mobility    Modified Rankin (Stroke Patients Only)       Balance Overall balance assessment: Needs assistance Sitting-balance support: Feet supported;Bilateral upper extremity supported Sitting balance-Leahy Scale: Poor Sitting balance - Comments: Sat EOB x 6-8 minutes with min to mod assist Postural control: Posterior lean Standing balance support: Bilateral upper extremity supported;During functional activity Standing balance-Leahy Scale: Poor Standing balance comment: walker and mod assist for static standing                            Cognition Arousal/Alertness: Awake/alert Behavior During Therapy: Flat affect Overall Cognitive Status: Impaired/Different from baseline Area of Impairment: Orientation;Memory;Problem solving                 Orientation Level: Disoriented to;Time;Situation   Memory: Decreased short-term memory       Problem Solving: Slow processing;Requires verbal  cues;Requires tactile cues        Exercises      General Comments        Pertinent Vitals/Pain Pain Assessment: Faces Faces Pain Scale: Hurts worst Pain Location: buttocks Pain Descriptors  / Indicators: Grimacing;Guarding;Sharp;Sore Pain Intervention(s): Limited activity within patient's tolerance;Premedicated before session;Repositioned    Home Living                      Prior Function            PT Goals (current goals can now be found in the care plan section) Acute Rehab PT Goals PT Goal Formulation: With patient Time For Goal Achievement: 05/23/17 Potential to Achieve Goals: Fair Progress towards PT goals: Goals downgraded-see care plan    Frequency    Min 3X/week      PT Plan Discharge plan needs to be updated    Co-evaluation              AM-PAC PT "6 Clicks" Daily Activity  Outcome Measure  Difficulty turning over in bed (including adjusting bedclothes, sheets and blankets)?: Total Difficulty moving from lying on back to sitting on the side of the bed? : Total Difficulty sitting down on and standing up from a chair with arms (e.g., wheelchair, bedside commode, etc,.)?: Total Help needed moving to and from a bed to chair (including a wheelchair)?: A Lot Help needed walking in hospital room?: A Lot Help needed climbing 3-5 steps with a railing? : Total 6 Click Score: 8    End of Session Equipment Utilized During Treatment: Gait belt;Oxygen Activity Tolerance: Patient limited by fatigue;Patient limited by pain Patient left: in chair;with call bell/phone within reach;with family/visitor present Nurse Communication: Mobility status;Need for lift equipment PT Visit Diagnosis: Muscle weakness (generalized) (M62.81);Difficulty in walking, not elsewhere classified (R26.2);Pain Pain - part of body:  (buttocks, groin)     Time: 4098-1191 PT Time Calculation (min) (ACUTE ONLY): 28 min  Charges:  $Gait Training: 23-37 mins                    G Codes:       [facility] PT [phone]    [name] [facility] 05/09/2017, 11:03 AM

## 2017-05-09 NOTE — Progress Notes (Addendum)
Patient ID: [name], male   DOB: 04/05/1941, 76 y.o.   MRN: [id number]   Advanced Heart Failure VAD Team Note  Subjective:    Admitted 04-25-17 with black, tarry stools and Hgb 6.6.  Septic/PNA- antibiotics stopped on 5/29.   Complaining of cough and buttock pain. + edema. Still with some pain but improving. Taking 2 Boosts per day. Working with PT who recommended CIR.   LVAD INTERROGATION:  HeartMate II LVAD:  Flow 6.2 liters/min, speed 9400, power 6.3 PI 4.7  3-5 PI events   Objective:    Vital Signs:   Temp:  [97.9 F (36.6 C)-99 F (37.2 C)] 98.5 F (36.9 C) (06/07 0400) Pulse Rate:  [57-72] 72 (06/07 0700) Resp:  [8-28] 15 (06/07 0700) SpO2:  [87 %-100 %] 99 % (06/07 0700) Weight:  [214 lb 4.8 oz (97.2 kg)] 214 lb 4.8 oz (97.2 kg) (06/07 0400) Last BM Date: 05/07/17 Mean arterial Pressure 70-80s  Intake/Output:   Intake/Output Summary (Last 24 hours) at 05/09/17 0734 Last data filed at 05/09/17 0400  Gross per 24 hour  Intake              595 ml  Output              985 ml  Net             -390 ml    CVP 14-15 Physical Exam: GENERAL: Chronically ill appearing, moans with movement. NAD HEENT: normal  NECK: Supple, JVP to jaw.  2+ bilaterally, no bruits.  No lymphadenopathy or thyromegaly appreciated.   CARDIAC:  Mechanical heart sounds with LVAD hum present.  LUNGS:  Rhonchi through out on 2 liters oxygen.   ABDOMEN:  Soft, round, nontender, positive bowel sounds x4.     LVAD exit site: well-healed and incorporated.  Dressing dry and intact.  No erythema or drainage.  Stabilization device present and accurately applied.  Driveline dressing is being changed daily per sterile technique. EXTREMITIES:  Warm and dry, no cyanosis, clubbing, R and LLE with ace wraps. R and L thigh 2+ edema  NEUROLOGIC:  Alert and oriented x 2 (thinks it is 42 and [employer])  No aphasia.  No dysarthria.  Affect flat Skin: Dry scales noted. Silicone dressing to RUE/  L Scrotum/Penic with skin loss.   GU: Foley    Telemetry:  A fib PVCs  60s personally reviewed.    Labs: Basic Metabolic Panel:  Recent Labs Lab 05/04/17 0500 05/06/17 0351 05/07/17 0349 05/08/17 0358 05/09/17 0434  NA 137 138 136 135 133*  K 4.9 5.0 4.4 5.1 5.4*  CL 106 106 102 101 98*  CO2 26 26 28 30 29   GLUCOSE 138* 88 83 84 79  BUN 60* 62* 64* 59* 60*  CREATININE 1.76* 1.89* 1.93* 1.76* 1.79*  CALCIUM 7.5* 7.5* 7.6* 7.9* 8.1*    Liver Function Tests: No results for input(s): AST, ALT, ALKPHOS, BILITOT, PROT, ALBUMIN in the last 168 hours. No results for input(s): LIPASE, AMYLASE in the last 168 hours. No results for input(s): AMMONIA in the last 168 hours.  CBC:  Recent Labs Lab 05/06/17 0351 05/07/17 0349 05/08/17 0358 05/08/17 2106 05/09/17 0434  WBC 7.9 7.9 6.7 7.1 7.6  HGB 8.0* 7.9* 7.7* 8.7* 8.6*  HCT 27.4* 27.1* 26.0* 29.6* 29.4*  MCV 93.8 93.1 93.9 94.0 93.9  PLT 243 242 227 232 244    INR:  Recent Labs Lab 05/05/17 0411 05/06/17 0351 05/07/17 0349 05/08/17  2119 05/09/17 0434  INR 2.37 2.60 2.33 2.00 1.73    Other results:   Imaging: No results found.   Medications:     Scheduled Medications: . calcium carbonate  1 tablet Oral BID WC  . cholecalciferol  1,000 Units Oral Daily  . citalopram  10 mg Oral Daily  . febuxostat  40 mg Oral Daily  . feeding supplement (ENSURE ENLIVE)  237 mL Oral BID BM  . ferrous sulfate  325 mg Oral Q breakfast  . hydroxychloroquine  400 mg Oral QPC breakfast  . mirtazapine  7.5 mg Oral QHS  . mometasone-formoterol  2 puff Inhalation BID  . pantoprazole  40 mg Oral BID  . pravastatin  40 mg Oral Daily  . sodium chloride flush  10-40 mL Intracatheter Q12H  . tamsulosin  0.4 mg Oral Daily  . traMADol  100 mg Oral Q8H  . Warfarin - Pharmacist Dosing Inpatient   Does not apply q1800    Infusions:   PRN Medications: acetaminophen, albuterol, diphenhydrAMINE, fentaNYL (SUBLIMAZE) injection,  hydrOXYzine, Melatonin, ondansetron (ZOFRAN) IV, sodium chloride flush   Assessment/Plan   1. 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 Noted to have functional SBO with 3 retained capsule cameras.  Has had multiple GI work-ups at [facility]. Would not repeat scope unless rebleeds. -- Has had multiple transfusions this admission. Check iron stores end of June. Continue Iron daily.  -- Got another unit RBCs yesterday. Todays Hgb 8.6  2. Acute on chronic systolic CHF due to ICM s/p LVAD 04/2012 at [facility] for DT.   - CVP up to 14-15. Give 80 mg IV twice daily. K 5.4 watch.  3. Chronic anticoagulation for LVAD/afib:  --Goal INR 1.7-2.3, no ASA. -- INR 1.73. Continue coumadin. INR likely down due to vit K in Boost. Dosing d/w PharmD 4. Chronic Afib:  - On coumadin. Rate controlled.     5. Shock Septic with productive cough: suspect HCAP.  CXR with L basilar airspace opacity.  PCT falling,  Off pressors.  WBC 7.6  -- Antibiotics stopped 5/29. 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 He has had 7 days of antibiotics.  6. AKI on CKD IV:  - Creatinine 1.79. Watch with IV diuresis.   7. 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 Continue silicone dressing to R forearm and buttock. Restart Gerhardt creams to perianal skin Continue interdry to groin. Add Difulcan Continue low air loss overload and reposition R to L only.  - BLE legs wrapped with Ace bandages.  Elevated heels to prevent pressure ulcers. Change daily.  8. Depression - On-going. Encouraged to participate in care.  9. Hyperkalemia  - K 5.4. Suspect oral supplements influencing. Diurese with IV lasix.  10. NSVT - Mostly quiescent off amio, but with occasional 3-4 beat run. No change.  11. Severe Malnutrition - Nutrition following. Prealbumin 7.3  (5/31). Continue high protein foods. No change. Recheck pre-albumin in am.  12. Urinary Incontinence- Continue foley for now until skin issues improved.  13. Candidiasis- Groin--Pharmacy to dose IV diflucan.    Needs aggressive pulmonary toilet.   Disposition: CIR versus SNF  I reviewed the LVAD parameters from today, and compared the results to the patient's prior recorded data.  No programming changes were made.  The LVAD is functioning within  specified parameters.  The patient performs LVAD self-test daily.  LVAD interrogation was negative for any significant power changes, alarms or PI events/speed drops.  LVAD equipment check completed and is in good working order.  Back-up equipment present.   LVAD education done on emergency procedures and precautions and reviewed exit site care.  Length of Stay: 16  [name], NP 05/09/2017, 7:34 AM  VAD Team --- VAD ISSUES ONLY--- Pager [phone] (7am - 7am)  Advanced Heart Failure Team  Pager [phone] (M-F; 7a - 4p)  Please contact [facility] for night-coverage after hours (4p -7a ) and weekends on [email or web address]  Patient seen and examined with [name], NP. We discussed all aspects of the encounter. I agree with the assessment and plan as stated above.   Remains quite tenuous with multiple active issues  Skin lesions improving slowly. Continue protein supplementation. Recheck pre-albumin. Add Diflucan for fungal superinfection .  Volume status up after holding diuretics. Will restart IV diuretics. Watch K and renal function. VAD parameters stable.  Remains in chronic AF. Rate ok. Amio stooped.   Hgb still soft. Will check iron stores in am .   Coumadin dosing discussed with PharmD. Need to adjust given Boost supplements which have vitamin K.  Long talk with him and his wife about disposition. He will likely not qualify for CIR. Will need SNF. Have suggested [facility] as they are comfortable with VADs. Also discussed [facility], [facility]  and [facility]. His wife is very upset about this. Will also consult Palliative Care for GOC.  Arvilla Meres, MD  8:40 AM

## 2017-05-09 NOTE — Care Management Note (Addendum)
Case Management Note Donn Pierini RN, BSN Unit 2W-Case Manager-- 2H coverage [phone]  Patient Details  Name: [name] MRN: [id number] Date of Birth: 07-28-1941  Subjective/Objective:  LVAD pt admitted with symptomatic anemia- hgb 6.6 - transfused 2 U- hgb now 8.2-                    Action/Plan: PTA pt lived at home with wife- anticipate return home- CM to follow for d/c needs  Expected Discharge Date:                  Expected Discharge Plan:  Long Term Acute Care (LTAC)  In-House Referral:  Clinical Social Work  Discharge planning Services  CM Consult  Post Acute Care Choice:    Choice offered to:     DME Arranged:    DME Agency:     HH Arranged:    HH Agency:     Status of Service:  In process, will continue to follow  If discussed at Long Length of Stay Meetings, dates discussed:  6/5, 6/7  Discharge Disposition:   Additional Comments:  05/09/17- 1100 Jaculin Rasmus RN, CM- per [employer]- pt may be LTAC appropriate- have received referral for possible LTAC placement per MD- [facility] does not take LTAC pt- [facility] does- have spoken with pt and wife-[name] at the bedside-Long discussion on pts goal to return home and rehab options including CIR- discussed options of LTAC vs SNF with ultimate goal of pt going home with wife. Gave pt and wife options for facilities that have been trained for LVADs which are [facility] for LTAC and [facility] and [facility] for SNF- wife is hesitant regarding [facility] LTAC (had a friend to expire there 8 yrs ago) but is agreeable to speak with the liaison- she also wants to go see the 2 SNF options that will take LVAD pts- will have CSW come pt to speak with pt and wife regarding the SNF options. Have placed call to [facility] liaison who will come see pt and wife today. PC consult has also been made per rounding team. CM will continue to follow for d/c needs.   05/07/17- 1245- Eldridge Marcott RN, CM-  Noted MD note regarding skin -  Extensive partial thickness skin loss to penis, scrotum, and groin.- very weak and edematous-  Remains very tenuous- most likely will need SNF- will have CSW follow.   Darrold Span, RN 05/09/2017, 11:49 AM

## 2017-05-09 NOTE — Progress Notes (Signed)
Provided pt wife with information about [facility] and [facility].  Pt wife gave permission for representatives from either facility to contact her or visit her at bedside- [facility] representative to meet in person and [facility] to call and set up time for a tour  Pt wife very emotional during conversation- upset that the pt is making her feel guilty for placing him at SNF despite her inability to care for him at home.  Wife also upset that vancomycin had to be used for the patient and has led to him having a set back from the wounds.  CSW provided active listening and support for wife- patient asleep during conversation.  CSW will continue to follow   Burna Sis, LCSW Clinical Social Worker [phone]

## 2017-05-09 NOTE — Progress Notes (Signed)
ANTICOAGULATION/ANTIFUNGAL CONSULT NOTE - Follow Up Consult  Pharmacy Consult for Warfarin and Fluconazole Indication: LVAD and fungal superinfection  Allergies  Allergen Reactions  . Amoxicillin-Pot Clavulanate Rash  . Vancomycin Hives    Red rash with blisters/sloughing  . Levofloxacin Rash    Doesn't tolerate  . Oxycodone Hives  . Ace Inhibitors Rash  . Ceftriaxone Rash  . Cefuroxime Rash  . Chlorhexidine Gluconate Rash    H/o recurrent cellulitis and rash   . Clindamycin/Lincomycin Rash    Erythematous drug eruption  . Hydralazine Itching  . Meropenem Rash  . Sulfonamide Derivatives Rash    Mother was highly allergic. Patient untested.   [name] Zosyn [Piperacillin Sod-Tazobactam So] Rash    No other systemic symptoms    Patient Measurements: Height: 5\' 7"  (170.2 cm) Weight: 214 lb 4.8 oz (97.2 kg) IBW/kg (Calculated) : 66.1  Vital Signs: Temp: 98.6 F (37 C) (06/07 0700) Temp Source: Oral (06/07 0700) Pulse Rate: 54 (06/07 1000)  Labs:  Recent Labs  05/07/17 0349 05/08/17 0358 05/08/17 2106 05/09/17 0434  HGB 7.9* 7.7* 8.7* 8.6*  HCT 27.1* 26.0* 29.6* 29.4*  PLT 242 227 232 244  LABPROT 25.9* 23.0*  --  20.5*  INR 2.33 2.00  --  1.73  CREATININE 1.93* 1.76*  --  1.79*    Estimated Creatinine Clearance: 37.8 mL/min (A) (by C-G formula based on SCr of 1.79 mg/dL (H)).  Assessment: 76 yo male with LVAD on chronic warfarin.  Admitted with supratherapeutic INR of 4.12 and GIB. Warfarin was held 5/22-5/27 and restarted 5/28.   INR trending down today to 1.7 - likely from held dose 6/4 and ensure supplements which contain vitamin k. He is also starting fluconazole today for a fungal superinfection of his groin. Fluconazole can elevate INR so will need to watch closely. Continues on PO amiodarone.   Hgb low but stable. LDH stable. No obvious bleeding. Pre-albumin low 7.3 - rechecking 6/8.  PTA Warfarin dose 2 mg daily.  Per patient report was recently decreased  from 2 mg daily except 3 mg on MWF. No aspirin.  Goal of Therapy:  INR 1.7-2.3 (per [facility]) Monitor platelets by anticoagulation protocol: Yes   Plan:  1) Warfarin 3mg  tonight 2) Daily INR, CBC, LDH 3) Fluconazole 400mg  IV q24  Louie Casa, PharmD, BCPS 05/09/2017 11:05 AM

## 2017-05-09 NOTE — Consult Note (Signed)
Consultation Note Date: 05/09/2017   Patient Name: [name]  DOB: [birth year]  MRN: [id number]  Age / Sex: 76 y.o., male  PCP: Hayden Chroman, MD Referring Physician: Jolaine Artist, MD  Reason for Consultation: Establishing goals of care  HPI/Patient Profile: 76 y.o. 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 Workup revealed Hgb at 6.6, sepsis and pneumonia. He has received multiple blood transfusions during admission and current Hgb is at 8.6. 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 Functional recovery has been minimal. He does not qualify for CIR- not able to withstand intense rehab. Palliative medicine consulted for [name].   Clinical Assessment and Goals of Care: Met with patient and his spouse. Patient in bed, lethargic, very HOH so most of conversation was with spouse.  Introduced palliative medicine. Palliative medicine is specialized medical care for people living with serious illness. It focuses on providing relief from the symptoms and stress of a serious illness. The goal is to improve quality of life for both the patient and the family. Conducted brief life review. Patient has had a good three of the last five years since receiving LVAD. He enjoys his pickup truck and his family.  He has declined significantly in the last year. Last year he was walking independently with a cane. Prior to this admission he was in a wheelchair. Patient is no longer wanting to eat or drink. Spouse  has noted changes in his cognitive functioning and memory.  [name] tells me he has expressed a desire to "pull the plug". She says he has said he is tired. His goc for LVAD was to be functional- be able to drive in his pickup truck- he can no longer do that- the LVAD is no longer meeting this goal. This concerns her because she equates this with suicide. Her mother committed suicide and she does not want to lose her spouse this way.  Discussed with [name] the difference in [place] of aggressive therapies and rehab v focusing on comfort and quality. [name] believes [name] would want to focus on comfort and quality- she says he is tired of her nagging him to eat, to try and walk, to get better. [name] says his GOC are to be at home. He doesn't want to be in a SNF.  We discussed that [place] has had overall downward trajectory in the last year and is likely heading towards end of life. [name] agrees. Discussed option of residential hospice with focus on comfort and quality. Discussed that at some point would be reasonable to deactivate the LVAD. [name] stated she did not know this was an option. She was under the  impression that this would be considered suicide. Discussed how LVAD is considered artifical life prolonging measure, similar to ventilator, and there are times when withdrawal is appropriate- ie- poor quality of life, unresolveable co-morbidities, or in patient's case- continued failure to thrive, poor quality of life despite LVAD therapy hence LVAD no longer meeting [place].  Discussed code status. If patient were to die despite best efforts- [name] notes he would not want resuscitation efforts- therefore I will change code status to DNR.     Primary Decision Maker NEXT OF KIN - spouse    SUMMARY OF RECOMMENDATIONS  -DNR -Continue current scope of care -PMT will continue to follow and further delineate [place]    Code Status/Advance Care Planning:  DNR  Prognosis:    < 3 months d/t advanced CHF,  FTT, not eating or drinking, Albumin 1.6  Discharge Planning: To Be Determined  Primary Diagnoses: Present on Admission: . Symptomatic anemia   I have reviewed the medical record, interviewed the patient and family, and examined the patient. The following aspects are pertinent.  Past Medical History:  Diagnosis Date  . Allergy   . Anemia   . Anemia of chronic renal failure, stage 4 (severe) ([place]) 02/11/2017  . Angina   . Anxiety   . Blood transfusion   . CAD (coronary artery disease)   . Cataract   . CHF (congestive heart failure) ([place])   . Clotting disorder ([place])   . COPD (chronic obstructive pulmonary disease) ([place])   . Diabetes mellitus   . DVT of lower extremity (deep venous thrombosis) (H[facility])    left  . Gout   . H/O hiatal hernia   . Heart murmur   . High cholesterol   . Hypertension   . Hypotension    ORTHOSTATIC  . Iron deficiency anemia due to chronic blood loss 02/11/2017  . Jaundice    "@ birth & when I was in 7th grade"  . Malabsorption of iron 02/11/2017  . Mitral valve disorder   . Myocardial infarction ([place]) 1988  . Neuromuscular disorder ([place])   . Osteoporosis   . Pneumonia 1993; 2009   "both serious cases"  . Pneumonia 2010   "not hospitalized"  . Pulmonary embolism on left ([place]) 1988  . 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 Shortness of breath on exertion   . Sleep apnea   . Stomach ulcer 1987-1988  . Stroke [facility] 1987   "brain stem; skin sensitive since then; once in awhile slur"12/13/11)   Social History   Social History  . Marital status: Married    Spouse name: N/A  . Number of children: N/A  . Years of education: N/A   Social History Main Topics  . Smoking status: Former Smoker    Packs/day: 1.00    Years: 30.00    Types: Cigarettes    Quit date: 12/03/1986  . Smokeless tobacco: Never Used  . Alcohol use No  . Drug use: No  . Sexual activity: No   Other Topics Concern  . None   Social History Narrative  . None    Family History  Problem Relation Age of Onset  . Cancer Mother   . Heart disease Mother   . Cancer Father   . Prostate cancer Brother    Scheduled Meds: . calcium carbonate  1 tablet Oral BID WC  . cholecalciferol  1,000 Units Oral Daily  . febuxostat  40 mg Oral Daily  . feeding supplement (  ENSURE ENLIVE)  237 mL Oral BID BM  . ferrous sulfate  325 mg Oral Q breakfast  . furosemide  80 mg Intravenous BID  . Gerhardt's butt cream   Topical TID  . hydroxychloroquine  400 mg Oral QPC breakfast  . mirtazapine  7.5 mg Oral QHS  . mometasone-formoterol  2 puff Inhalation BID  . pantoprazole  40 mg Oral BID  . pravastatin  40 mg Oral Daily  . sodium chloride flush  10-40 mL Intracatheter Q12H  . tamsulosin  0.4 mg Oral Daily  . traMADol  100 mg Oral Q8H  . warfarin  3 mg Oral ONCE-1800  . Warfarin - Pharmacist Dosing Inpatient   Does not apply q1800   Continuous Infusions: . fluconazole (DIFLUCAN) IV Stopped (05/09/17 1253)   PRN Meds:.acetaminophen, albuterol, diphenhydrAMINE, fentaNYL (SUBLIMAZE) injection, hydrOXYzine, Melatonin, ondansetron (ZOFRAN) IV, sodium chloride flush Medications Prior to Admission:  Prior to Admission medications   Medication Sig Start Date End Date Taking? Authorizing Provider  acetaminophen (TYLENOL) 500 MG tablet Take 1,000 mg by mouth every 6 (six) hours as needed.    Yes [provider]  albuterol (PROVENTIL HFA;VENTOLIN HFA) 108 (90 BASE) MCG/ACT inhaler Inhale 1-2 puffs into the lungs as needed for wheezing or shortness of breath.   Yes [provider]  budesonide-formoterol (SYMBICORT) 160-4.5 MCG/ACT inhaler Inhale 2 puffs into the lungs 2 (two) times daily.    Yes [provider]  calcium carbonate (OS-CAL) 600 MG TABS tablet Take 1,200 mg by mouth 2 (two) times daily.    Yes [provider]  Cholecalciferol (VITAMIN D3) 2000 UNITS TABS Take 1 tablet by mouth daily.   Yes [provider]   colchicine 0.6 MG tablet Take 0.6 mg by mouth daily.   Yes [provider]  Darbepoetin Alfa (ARANESP) 100 MCG/0.5ML SOSY injection Inject into the skin. Every three week last one May 11 th next on due  June 1st 01/29/17  Yes [provider]  diclofenac sodium (VOLTAREN) 1 % GEL Apply topically. 01/25/17  Yes [provider]  doxycycline (VIBRA-TABS) 100 MG tablet Take 1 tablet (100 mg total) by mouth 2 (two) times daily. 04/16/17  Yes Laurey Morale, MD  ferrous sulfate 325 (65 FE) MG tablet Take 325 mg by mouth.  01/25/17 01/25/18 Yes [provider]  hydroxychloroquine (PLAQUENIL) 200 MG tablet Take by mouth daily. 2 tabs with breakfast   Yes [provider]  Melatonin 3-10 MG TABS Take 5 mg by mouth at bedtime as needed.    Yes [provider]  metoprolol tartrate (LOPRESSOR) 25 MG tablet Take 25 mg by mouth 2 (two) times daily.   Yes [provider]  Mirtazapine (REMERON PO) Take 15 mg by mouth at bedtime.    Yes [provider]  Multiple Vitamin (MULTIVITAMIN) tablet Take 1 tablet by mouth daily.   Yes [provider]  pantoprazole (PROTONIX) 40 MG tablet Take 40 mg by mouth 2 (two) times daily.    Yes [provider]  pravastatin (PRAVACHOL) 40 MG tablet Take one by mouth daily   Yes Serpe, Clide Deutscher, PA-C  predniSONE (DELTASONE) 10 MG tablet Take 10 mg by mouth as needed.  03/08/17  Yes [provider]  tamsulosin (FLOMAX) 0.4 MG CAPS capsule Take 0.4 mg by mouth daily.   Yes [provider]  torsemide (DEMADEX) 20 MG tablet Take 20 mg by mouth every morning. May take an extra tab in the evening as  needed.   Yes [provider]  ULORIC 80 MG TABS Take 80 mg by mouth daily.  01/03/17  Yes [provider]  warfarin (COUMADIN) 2 MG tablet Take 1-3 mg by mouth as directed. Managed by [facility] Patient checks at home   Yes [provider]   Allergies  Allergen Reactions   . Amoxicillin-Pot Clavulanate Rash  . Vancomycin Hives    Red rash with blisters/sloughing  . Levofloxacin Rash    Doesn't tolerate  . Oxycodone Hives  . Ace Inhibitors Rash  . Ceftriaxone Rash  . Cefuroxime Rash  . Chlorhexidine Gluconate Rash    H/o recurrent cellulitis and rash   . Clindamycin/Lincomycin Rash    Erythematous drug eruption  . Hydralazine Itching  . Meropenem Rash  . Sulfonamide Derivatives Rash    Mother was highly allergic. Patient untested.   [name] Zosyn [Piperacillin Sod-Tazobactam So] Rash    No other systemic symptoms   Review of Systems  Physical Exam  Pulmonary/Chest:  Wet cough  Neurological:  Lethargic, hoh  Skin:  Scattered bullae    Vital Signs: BP (!) 79/61 (BP Location: Left Arm)   Pulse 60   Temp 98.6 F (37 C) (Oral)   Resp 12   Ht '5\' 7"'$  (1.702 m)   Wt 97.2 kg (214 lb 4.8 oz)   SpO2 99%   BMI 33.56 kg/m  Pain Assessment: 0-10 POSS *See Group Information*: S-Acceptable,Sleep, easy to arouse Pain Score: Asleep   SpO2: SpO2: 99 % O2 Device:SpO2: 99 % O2 Flow Rate: .O2 Flow Rate (L/min): 2 L/min  IO: Intake/output summary:  Intake/Output Summary (Last 24 hours) at 05/09/17 1606 Last data filed at 05/09/17 1053  Gross per 24 hour  Intake              775 ml  Output              960 ml  Net             -185 ml    LBM: Last BM Date: 05/07/17 Baseline Weight: Weight: 88.9 kg (196 lb) Most recent weight: Weight: 97.2 kg (214 lb 4.8 oz)     Palliative Assessment/Data: PPS: 20%     Thank you for this consult. Palliative medicine will continue to follow and assist as needed.   Time In: 1300 Time Out: 1500 Time Total: 120 minutes Greater than 50%  of this time was spent counseling and coordinating care related to the above assessment and plan.  Signed by: Mariana Kaufman, AGNP-C Palliative Medicine    Please contact Palliative Medicine Team phone at [phone] for questions and concerns.  For individual provider: See  Shea Evans

## 2017-05-09 NOTE — Progress Notes (Signed)
LVAD Coordinator inpatient rounding note:  Admitted 5/22/18due to suspected GIB due to black, tarry stools.   Septic/PNA- antibiotics stopped on 5/29.   HeartMate II LVAD implanted on 5/2013by [facility].   Vital signs: HR: 63 Doppler Pressure: 86 Automatic BP: Not able to perform O2 Sat: 93 on 2 LNC Wt in lbs:  196.....205>207>208>215>209>213>214  LVAD interrogation reveals:  Speed: 9400 Flow: 6.2 Power: 6.3 PI: 4.7 Alarms: none Events:multiple 2 - 5 daily over last 48 hrs Fixed speed: 9400 Low speed limit: 8800  Drive Line: Weekly. Performed by wife; next dressing change due 05/16/17  Gtts:  Amiodarone 30 mg/hr - started 04/30/17, stopped 6/2   Labs:  LDH trend:143>137>200>131>139>129>129>125>128>137>137>126>154  INR trend: 4.12>3.94>3.99>1.77>1.79>2.19>2.27>2.15>2.37>2.60>2.33>2.00>1.73  Anticoagulation Plan: -INR Goal: 2-3 -ASA Dose: none due to GIB history  Adverse Events on VAD: -07/2013>GIB  Plan/Recommendations:   1. Weekly dressing changes. Next due 6/14. 2. Will likely need SNF placement.   Hessie Diener RN, VAD Coordinator 24/7 pager [phone]

## 2017-05-10 DIAGNOSIS — I959 Hypotension, unspecified: Secondary | ICD-10-CM

## 2017-05-10 DIAGNOSIS — R0602 Shortness of breath: Secondary | ICD-10-CM

## 2017-05-10 LAB — URINALYSIS, ROUTINE W REFLEX MICROSCOPIC
BILIRUBIN URINE: NEGATIVE
Glucose, UA: NEGATIVE mg/dL
KETONES UR: NEGATIVE mg/dL
Nitrite: NEGATIVE
Protein, ur: NEGATIVE mg/dL
SPECIFIC GRAVITY, URINE: 1.012 (ref 1.005–1.030)
pH: 5 (ref 5.0–8.0)

## 2017-05-10 LAB — IRON AND TIBC
IRON: 19 ug/dL — AB (ref 45–182)
Saturation Ratios: 13 % — ABNORMAL LOW (ref 17.9–39.5)
TIBC: 141 ug/dL — ABNORMAL LOW (ref 250–450)
UIBC: 122 ug/dL

## 2017-05-10 LAB — CBC
HCT: 28.6 % — ABNORMAL LOW (ref 39.0–52.0)
Hemoglobin: 8.6 g/dL — ABNORMAL LOW (ref 13.0–17.0)
MCH: 28 pg (ref 26.0–34.0)
MCHC: 30.1 g/dL (ref 30.0–36.0)
MCV: 93.2 fL (ref 78.0–100.0)
PLATELETS: 245 10*3/uL (ref 150–400)
RBC: 3.07 MIL/uL — ABNORMAL LOW (ref 4.22–5.81)
RDW: 18.8 % — AB (ref 11.5–15.5)
WBC: 9.6 10*3/uL (ref 4.0–10.5)

## 2017-05-10 LAB — PROTIME-INR
INR: 1.66
PROTHROMBIN TIME: 19.8 s — AB (ref 11.4–15.2)

## 2017-05-10 LAB — BASIC METABOLIC PANEL
Anion gap: 8 (ref 5–15)
BUN: 62 mg/dL — ABNORMAL HIGH (ref 6–20)
CALCIUM: 8 mg/dL — AB (ref 8.9–10.3)
CO2: 29 mmol/L (ref 22–32)
CREATININE: 2.04 mg/dL — AB (ref 0.61–1.24)
Chloride: 96 mmol/L — ABNORMAL LOW (ref 101–111)
GFR calc Af Amer: 34 mL/min — ABNORMAL LOW (ref >60)
GFR, EST NON AFRICAN AMERICAN: 30 mL/min — AB (ref >60)
GLUCOSE: 93 mg/dL (ref 65–99)
Potassium: 4.7 mmol/L (ref 3.5–5.1)
SODIUM: 133 mmol/L — AB (ref 135–145)

## 2017-05-10 LAB — PREALBUMIN: Prealbumin: 6.2 mg/dL — ABNORMAL LOW (ref 18–38)

## 2017-05-10 LAB — LACTATE DEHYDROGENASE: LDH: 144 U/L (ref 98–192)

## 2017-05-10 LAB — AMMONIA: Ammonia: 12 umol/L (ref 9–35)

## 2017-05-10 MED ORDER — SODIUM CHLORIDE 0.9 % IV SOLN
510.0000 mg | INTRAVENOUS | Status: DC
  Administered 2017-05-10: 510 mg via INTRAVENOUS
  Filled 2017-05-10: qty 17

## 2017-05-10 MED ORDER — GERHARDT'S BUTT CREAM
TOPICAL_CREAM | Freq: Three times a day (TID) | CUTANEOUS | Status: DC
  Administered 2017-05-10 – 2017-05-13 (×7): via TOPICAL
  Filled 2017-05-10 (×3): qty 1

## 2017-05-10 MED ORDER — WARFARIN SODIUM 2 MG PO TABS
2.0000 mg | ORAL_TABLET | Freq: Once | ORAL | Status: AC
  Administered 2017-05-10: 2 mg via ORAL
  Filled 2017-05-10: qty 1

## 2017-05-10 MED ORDER — FUROSEMIDE 40 MG PO TABS
40.0000 mg | ORAL_TABLET | Freq: Two times a day (BID) | ORAL | Status: DC
  Administered 2017-05-10 – 2017-05-11 (×3): 40 mg via ORAL
  Filled 2017-05-10 (×3): qty 1

## 2017-05-10 NOTE — Progress Notes (Signed)
Noted Palliative plans. We will sign off at this time. [phone]

## 2017-05-10 NOTE — Progress Notes (Signed)
Patient ID: [name], male   DOB: October 20, 1941, 76 y.o.   MRN: [id number]   Advanced Heart Failure VAD Team Note  Subjective:    Admitted 04/24/2017 with black, tarry stools and Hgb 6.6.  Septic/PNA- antibiotics stopped on 5/29.   Yesterday diuresed with IV lasix. Creatinine trending up. IV diflucan added for candidiasis. Palliative Care consulted for GOC. Now DNR.   Complaining of pain in LLE. Confused. Not hungry.   LVAD INTERROGATION:  HeartMate II LVAD:  Flow 6 liters/min, speed 9400, power 6 PI 4.7    Objective:    Vital Signs:   Temp:  [98.5 F (36.9 C)-98.8 F (37.1 C)] 98.8 F (37.1 C) (06/08 0400) Pulse Rate:  [54-83] 62 (06/08 0600) Resp:  [8-24] 13 (06/08 0600) SpO2:  [84 %-100 %] 96 % (06/08 0731) Weight:  [216 lb (98 kg)] 216 lb (98 kg) (06/08 0500) Last BM Date: 05/07/17 Mean arterial Pressure 70-80s  Intake/Output:   Intake/Output Summary (Last 24 hours) at 05/10/17 0806 Last data filed at 05/10/17 0600  Gross per 24 hour  Intake              440 ml  Output             1675 ml  Net            -1235 ml    Physical Exam: CVP 5-6 GENERAL: Chronically ill. In bed. Moaning with movement. Wife at the bedside.  HEENT: normal  NECK: Supple, JVP 5-6 .  2+ bilaterally, no bruits.  No lymphadenopathy or thyromegaly appreciated.   CARDIAC:  Irregular. Mechanical heart sounds with LVAD hum present.  LUNGS:  Rhonchi throughout. .  ABDOMEN:  Soft, round, nontender, positive bowel sounds x4.     LVAD exit site: well-healed and incorporated.  Dressing dry and intact.  No erythema or drainage.  Stabilization device present and accurately applied.  Driveline dressing is being changed daily per sterile technique. EXTREMITIES:  Warm and dry, no cyanosis, clubbing, R and LLE 1+ edema. RUE PICC NEUROLOGIC:  Oriented to self. Affect flat.    Skin: Dry scales. Groin rash with satellite lesion. Perianal skin loss red. Dorsum L foot. Non- blanching purple area GU: Foley.  Scrotum/penis edema with partial thickness skin loss.     Telemetry:  A Fib 60-80s with PVCs  Personally reviewed.  Labs: Basic Metabolic Panel:  Recent Labs Lab 05/06/17 0351 05/07/17 0349 05/08/17 0358 05/09/17 0434 05/10/17 0454  NA 138 136 135 133* 133*  K 5.0 4.4 5.1 5.4* 4.7  CL 106 102 101 98* 96*  CO2 26 28 30 29 29   GLUCOSE 88 83 84 79 93  BUN 62* 64* 59* 60* 62*  CREATININE 1.89* 1.93* 1.76* 1.79* 2.04*  CALCIUM 7.5* 7.6* 7.9* 8.1* 8.0*    Liver Function Tests: No results for input(s): AST, ALT, ALKPHOS, BILITOT, PROT, ALBUMIN in the last 168 hours. No results for input(s): LIPASE, AMYLASE in the last 168 hours. No results for input(s): AMMONIA in the last 168 hours.  CBC:  Recent Labs Lab 05/07/17 0349 05/08/17 0358 05/08/17 2106 05/09/17 0434 05/10/17 0454  WBC 7.9 6.7 7.1 7.6 9.6  HGB 7.9* 7.7* 8.7* 8.6* 8.6*  HCT 27.1* 26.0* 29.6* 29.4* 28.6*  MCV 93.1 93.9 94.0 93.9 93.2  PLT 242 227 232 244 245    INR:  Recent Labs Lab 05/06/17 0351 05/07/17 0349 05/08/17 0358 05/09/17 0434 05/10/17 0454  INR 2.60 2.33 2.00 1.73 1.66  Other results:   Imaging: No results found.   Medications:     Scheduled Medications: . calcium carbonate  1 tablet Oral BID WC  . cholecalciferol  1,000 Units Oral Daily  . febuxostat  40 mg Oral Daily  . feeding supplement (ENSURE ENLIVE)  237 mL Oral BID BM  . ferrous sulfate  325 mg Oral Q breakfast  . furosemide  80 mg Intravenous BID  . Gerhardt's butt cream   Topical TID  . hydroxychloroquine  400 mg Oral QPC breakfast  . mirtazapine  7.5 mg Oral QHS  . mometasone-formoterol  2 puff Inhalation BID  . pantoprazole  40 mg Oral BID  . pravastatin  40 mg Oral Daily  . sodium chloride flush  10-40 mL Intracatheter Q12H  . tamsulosin  0.4 mg Oral Daily  . traMADol  100 mg Oral Q8H  . Warfarin - Pharmacist Dosing Inpatient   Does not apply q1800    Infusions: . fluconazole (DIFLUCAN) IV Stopped  (05/09/17 1253)    PRN Medications: acetaminophen, albuterol, diphenhydrAMINE, fentaNYL (SUBLIMAZE) injection, hydrOXYzine, Melatonin, ondansetron (ZOFRAN) IV, sodium chloride flush   Assessment/Plan   1. 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 Noted to have functional SBO with 3 retained capsule cameras.  Has had multiple GI work-ups at [facility]. Would not repeat scope unless rebleeds. -- Has had multiple transfusions this admission. Check iron stores end of June. Continue Iron daily.  -- Todays Hgb is 8.6. Stable.  2. Acute on chronic systolic CHF due to ICM s/p LVAD 04/2012 at [facility] for DT.   - Volume status improved. CVP 5 Creatinine trending up. Hold IV diuretics. Restart po lasix 40 mg daily. .  3. Chronic anticoagulation for LVAD/afib:  --Goal INR 1.7-2.3, no ASA. --INR 1.66. Continue coumadin. INR likely down due to vit K in Boost. Dosing d/w PharmD 4. Chronic Afib:  On coumadin. Rate controlled. Rate controlled.     5. Shock Septic with productive cough: suspect HCAP.  CXR with L basilar airspace opacity.  PCT falling,  Off pressors. WBC 9.6  -- Antibiotics stopped 5/29. 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 He has had 7 days of antibiotics.  6. AKI on CKD IV:  -Creatinine up to 2.04.    7. Suspicious for SJS versus Drug Rash Personally assess skin with WOC CNS. Discuss plan.  Slowly improving. Severe malnutrition and incontinence limiting.   Continue silicone dressing to R forearm and buttock.  Continue Gerhardt creams to perianal skin Continue interdry to groin. Continue IV diflucan.  Continue low air loss overload and reposition R to L only.   Elevated heels to prevent pressure ulcers. Add Prevalon Boot to R and LLE .  8. Depression - Off celexa 9. Hyperkalemia  - K 4.7  Suspect oral supplements  influencing.  10. NSVT -Off amio. K 4.7 Repeat mag in am.  11. Severe Malnutrition - Nutrition following. Prealbumin falling 6.2. Continue high protein foods. No change.  12. Urinary Incontinence- Continue foley for now until skin issues improved.  13. Candidiasis- Groin--Continue IV diflucan per pharmacy.  14. Suspect Deep Tissue Injury --->L Dorsum of Foot nonblanching purple area. Will need to keep compression off. Elevate heels.  15. DNR  He continues to decline despite aggressive care. Prealbumin lower today. Re consult Nutrition.  Disposition: Palliative Care appreciated. Wife considering home with Hospice. Now DNR.   I reviewed the LVAD parameters from today, and compared the results to the patient's prior recorded data.  No programming changes were made.  The LVAD is functioning within specified parameters.  The patient performs LVAD self-test daily.  LVAD interrogation was negative for any significant power changes, alarms or PI events/speed drops.  LVAD equipment check completed and is in good working order.  Back-up equipment present.   LVAD education done on emergency procedures and precautions and reviewed exit site care.  Length of Stay: 17  [name], NP 05/10/2017, 8:06 AM  VAD Team --- VAD ISSUES ONLY--- Pager [phone] (7am - 7am)  Advanced Heart Failure Team  Pager [phone] (M-F; 7a - 4p)  Please contact [facility] for night-coverage after hours (4p -7a ) and weekends on [email or web address]  Patient seen with NP, agree with the above note.  He is doing poorly.  Confused, creatinine rising.  Nutritional stores low by albumen/pre-albumen.  Family discussing future steps, think best option is going to be hospice house in [facility] if this can be arranged.    Main complaint is pain in multiple locations.  Will ask nurse to provide medication.   Marca Ancona 05/10/2017 3:26 PM

## 2017-05-10 NOTE — Progress Notes (Signed)
ANTICOAGULATION CONSULT NOTE - Follow Up Consult  Pharmacy Consult for Warfarin Indication: LVAD  Allergies  Allergen Reactions  . Amoxicillin-Pot Clavulanate Rash  . Vancomycin Hives    Red rash with blisters/sloughing  . Levofloxacin Rash    Doesn't tolerate  . Oxycodone Hives  . Ace Inhibitors Rash  . Ceftriaxone Rash  . Cefuroxime Rash  . Chlorhexidine Gluconate Rash    H/o recurrent cellulitis and rash   . Clindamycin/Lincomycin Rash    Erythematous drug eruption  . Hydralazine Itching  . Meropenem Rash  . Sulfonamide Derivatives Rash    Mother was highly allergic. Patient untested.   [name] Zosyn [Piperacillin Sod-Tazobactam So] Rash    No other systemic symptoms    Patient Measurements: Height: 5\' 7"  (170.2 cm) Weight: 216 lb (98 kg) IBW/kg (Calculated) : 66.1  Vital Signs: Temp: 99.3 F (37.4 C) (06/08 0900) Temp Source: Oral (06/08 0900) Pulse Rate: 65 (06/08 1000)  Labs:  Recent Labs  05/08/17 0358 05/08/17 2106 05/09/17 0434 05/10/17 0454  HGB 7.7* 8.7* 8.6* 8.6*  HCT 26.0* 29.6* 29.4* 28.6*  PLT 227 232 244 245  LABPROT 23.0*  --  20.5* 19.8*  INR 2.00  --  1.73 1.66  CREATININE 1.76*  --  1.79* 2.04*    Estimated Creatinine Clearance: 33.3 mL/min (A) (by C-G formula based on SCr of 2.04 mg/dL (H)).  Assessment: 76 yo male with LVAD on chronic warfarin.  Admitted with supratherapeutic INR of 4.12 and GIB. Warfarin was held 5/22-5/27 and restarted 5/28.   INR trending down today to 1.7 - likely from held dose 6/4 and ensure supplements which contain vitamin k. He is also on fluconazole for a fungal superinfection of his groin. Fluconazole can elevate INR so will need to watch closely. Continues on PO amiodarone.   Hgb low but stable. LDH stable. No obvious bleeding. Pre-albumin low 7.3 - rechecking 6/8.  PTA Warfarin dose 2 mg daily.  Per patient report was recently decreased from 2 mg daily except 3 mg on MWF. No aspirin.  Goal of Therapy:   INR 1.7-2.3 (per [facility]) Monitor platelets by anticoagulation protocol: Yes   Plan:  1) Warfarin 2 mg x 1 tonight. 2) Daily INR, CBC, LDH   Tad Moore, BCPS  Clinical Pharmacist Pager [phone]  05/10/2017 10:41 AM

## 2017-05-10 NOTE — Progress Notes (Signed)
PT Cancellation Note  Patient Details Name: [name] MRN: [id number] DOB: 10-25-1941   Cancelled Treatment:    Reason Eval/Treat Not Completed: Pain limiting ability to participate. Do not feel pt can tolerate activity with [place] today. Will check back Monday.   [name] [place] 05/10/2017, 9:10 AM  Skip Mayer PT [phone]

## 2017-05-10 NOTE — Progress Notes (Signed)
Nutrition Follow-up  INTERVENTION:   Encourage intake at meals  Continue Ensure Enlive po BID, each supplement provides 350 kcal and 20 grams of protein  NUTRITION DIAGNOSIS:   Inadequate oral intake related to acute illness as evidenced by meal completion < 25%. Ongoing.   GOAL:   Patient will meet greater than or equal to 90% of their needs Progressing.   MONITOR:   PO intake, Supplement acceptance, Labs, Weight trends  ASSESSMENT:   76 y.o. male with CAD with PCI, ICM, afib, hx of CVA in 1987, hx of DVT/PE, HTN, hyperlipidemia, CKD III, and chronic systolic HF. Pt had [name] implant at [facility] on 04/2012.  Admitted from [facility] 04/10/2017 with black, tarry stools and Hgb 6.6. 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   Labs reviewed: Prealbumin 6 Prealbumin likely more accurately reflects risk of mortality in patient with acute stress.  Meal completion documentation spotty but meals recorded are 25-100% Pain and confusion limiting intake.   Noted meeting with palliative, pt/family do not want any artifical feedings and considering disconnecting LVAD.  Diet Order:  Diet regular Room service appropriate? Yes; Fluid consistency: Thin  Skin:   Per WOC 6/4: non-pressure wound R/L buttocks and inner gluteal cleft, full thickness wound to scrotum Inner groin areas has improved and all blisters are beginning to dry and heal - per notes 6/8 wound improving  Last BM:  6/5 small  Height:   Ht Readings from Last 1 Encounters:  04/24/17 5\' 7"  (1.702 m)    Weight:   Wt Readings from Last 1 Encounters:  05/10/17 216 lb (98 kg)    Ideal Body Weight:  67.2 kg  BMI:  Body mass index is 33.83 kg/m.  Estimated Nutritional Needs:   Kcal:  1800-2100kcal/day   Protein:  91-109g/day   Fluid:  >1.8L/day  EDUCATION NEEDS:   Education needs addressed  Kendell Bane RD, LDN, CNSC [phone] Pager [phone] After Hours Pager

## 2017-05-10 NOTE — Consult Note (Addendum)
WOC Nurse wound follow up Requested to re-assess skin; appearance checked with [name], NP at the bedside.  Buttocks are red, moist and painful; affected area with full thickness wounds involving bilat buttocks and inner gluteal cleft have greatly decreased in size and approved in appearance; 5X5X.1cm, red and moist, no further bleeding, mod amt tan drainage with slight odor. Pt is on an air bed to optimize air flow to the affected locations.  Barrier cream to protect and promote healing and repel moisture.  Inner groin has improved  with use of Interdry silver-impregnated fabric, much less moisture associated skin damage; remains moist with patchy areas of partial thickness skin loss to posterior scrotum/inner groin, approx .2X.2X.1cm in 3 areas.  Abd folds with dry peeling skin and patchy areas of scabs. Lower penis remains swollen with with full thickness wound to scrotum, approx 6X3X.2cm, yellow, moist and painful.  All blisters related to previous antibiotics continue to dry and heal, revealing pink moist skin.  Continue to protect with foam dressings to promote healing. Pt continues to benefit from use of a foley short term to avoid urinary incontinence on the wound to the scrotum. Left anterior ankle with 2 partial thickness wounds; .1X.1X.1cm and .2X.2X.1cm, yellow moist wound beds, small amt yellow drainage, no odor.  Pt has developed patchy areas of deep tissue injury from the ace wrap he was previously wearing; anterior foot .2X.2cm and .8X.5cm, and posterior ankle .2X6cm deep tissue injury in a linear shape.  Plan: Leave ace wrap off, foam dressing to wounds on anterior foot.  Prevalon boots to BLE to reduce pressure.  Continue current plan of care to other locations. Discussed plan of care with primary team and patient's wife, she verbalized understanding. Please re-consult if further assistance is needed.  Thank-you,  Cammie Mcgee MSN, RN, CWOCN, [place], CNS [phone]

## 2017-05-10 NOTE — Progress Notes (Signed)
Daily Progress Note   Patient Name: [name]       Date: 05/10/2017 DOB: [birth year]  Age: 76 y.o. MRN#: [id number] Attending Physician: Hayden Artist, MD Primary Care Physician: Hayden Chroman, MD Admit Date: 04/15/2017  Reason for Consultation/Follow-up: Establishing goals of care and Psychosocial/spiritual support  Subjective: Pt more confused this am per RN. Eating small amt of breakfast. Wife at bedside. Son is out of town on vacation until [place]. Awaiting daughter Pt verbalizing pain all over now, skin breakdown worse. Creatinine today worse at 2.04, albumin 5/30 1.6, hgb stable at 8.6  Length of Stay: 17  Current Medications: Scheduled Meds:  . calcium carbonate  1 tablet Oral BID WC  . cholecalciferol  1,000 Units Oral Daily  . febuxostat  40 mg Oral Daily  . feeding supplement (ENSURE ENLIVE)  237 mL Oral BID BM  . ferrous sulfate  325 mg Oral Q breakfast  . Gerhardt's butt cream   Topical TID  . hydroxychloroquine  400 mg Oral QPC breakfast  . mirtazapine  7.5 mg Oral QHS  . mometasone-formoterol  2 puff Inhalation BID  . pantoprazole  40 mg Oral BID  . pravastatin  40 mg Oral Daily  . sodium chloride flush  10-40 mL Intracatheter Q12H  . tamsulosin  0.4 mg Oral Daily  . traMADol  100 mg Oral Q8H  . Warfarin - Pharmacist Dosing Inpatient   Does not apply q1800    Continuous Infusions: . fluconazole (DIFLUCAN) IV 400 mg (05/10/17 0903)    PRN Meds: acetaminophen, albuterol, diphenhydrAMINE, fentaNYL (SUBLIMAZE) injection, hydrOXYzine, Melatonin, ondansetron (ZOFRAN) IV, sodium chloride flush  Physical Exam  Constitutional: He appears well-developed and well-nourished.  HENT:  Head: Normocephalic and atraumatic.  Cardiovascular: Normal rate and regular  rhythm.   LVAD  Pulmonary/Chest: Effort normal.  Abdominal: Soft.  Musculoskeletal: Normal range of motion.  Neurological: He is alert.  Short term memory deficits noted  Skin: Skin is warm and dry. There is pallor.  Psychiatric:  Affect restricted; withdrawn  Nursing note and vitals reviewed.           Vital Signs: BP (!) 79/61 (BP Location: Left Arm)   Pulse 62   Temp 99.3 F (37.4 C) (Oral)   Resp 13   Ht '5\' 7"'$  (1.702 m)  Wt 98 kg (216 lb)   SpO2 96%   BMI 33.83 kg/m  SpO2: SpO2: 96 % O2 Device: O2 Device: Nasal Cannula O2 Flow Rate: O2 Flow Rate (L/min): 2 L/min  Intake/output summary:  Intake/Output Summary (Last 24 hours) at 05/10/17 1009 Last data filed at 05/10/17 0857  Gross per 24 hour  Intake              210 ml  Output             1500 ml  Net            -1290 ml   LBM: Last BM Date: 05/07/17 Baseline Weight: Weight: 88.9 kg (196 lb) Most recent weight: Weight: 98 kg (216 lb)       Palliative Assessment/Data:      Patient Active Problem List   Diagnosis Date Noted  . 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 Protein-calorie malnutrition, severe ([place]) 05/09/2017  . Acute on chronic systolic (congestive) heart failure ([place])   . Palliative care by specialist   . Goals of care, counseling/discussion   . Advance care planning   . Healthcare-associated pneumonia   . Chronic kidney disease (CKD), stage IV (severe) ([place])   . Tachycardia   . Tachypnea   . Symptomatic anemia 04/22/2017  . Iron deficiency anemia due to chronic blood loss 02/11/2017  . Malabsorption of iron 02/11/2017  . Anemia of chronic renal failure, stage 4 (severe) ([place]) 02/11/2017  . Mass of pancreas 09/01/2015  . Falls 08/17/2015  . LVAD (left ventricular assist device) present ([place]) 09/15/2014  . Fatigue 03/18/2014  . GI bleed 07/14/2013  . Implantable cardiac defibrillator lead failure 11/20/2012  . ICD-Medtronic 11/13/2012  . Gout 11/12/2012  . Acquired von  Willebrand disease ([place]) 05/27/2012  . Mitral regurgitation 11/20/2011  . Renal insufficiency 11/20/2011  . Edema 08/28/2011  . Peripheral neuropathy 08/28/2011  . Long term current use of anticoagulant therapy 02/23/2011  . SHORTNESS OF BREATH 12/21/2010  . POSTSURG PERCUT TRANSLUMINAL COR ANGPLSTY STS 12/21/2010  . CHRONIC SYSTOLIC HEART FAILURE 16/09/9603  . ATRIAL FIBRILLATION 10/13/2010  . DM 10/11/2009  . OBSTRUCTIVE SLEEP APNEA 10/11/2009  . CORONARY ARTERY DISEASE, S/P PTCA 10/11/2009  . DVT 10/11/2009  . PULMONARY NODULE 10/11/2009  . MESENTERIC VENOUS THROMBOSIS 10/11/2009  . OTH SPEC D/O RESULT FROM IMPAIRED RENAL FUNCTION 10/11/2009  . PULMONARY EMBOLISM, HX OF 10/11/2009  . HYPERLIPIDEMIA-MIXED 08/16/2009  . CAD, NATIVE VESSEL 08/16/2009  . HYPOTENSION, ORTHOSTATIC 08/16/2009    Palliative Care Assessment & Plan   Patient Profile:76 y.o. male  with past medical history of CAD, afb, CHF on LVAD (placed at [facility] x 5 yrs ago), CKD, chronic anemia (recurrent GI bleed- workup in 11/2016 at [facility] showed large AVM in duodenum not able to be revisualized on endo), functional SBO with 3 retained capsule cameras in abdominal hernia, admitted on 04/19/2017 with symptomatic anemia, lethargy, tarry stools, fatigue, dyspnea, productive cough. Workup revealed Hgb at 6.6, sepsis and pneumonia. He has received multiple blood transfusions during admission and current Hgb is at 8.6. 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 Functional recovery has been minimal. He does not qualify for CIR- not able to withstand intense rehab. Palliative medicine consulted for [name].  Initial palliative care consult 05/09/17, pt now DNR   Assessment: Met with wife and daughter to discuss goals of care as well as the practicalities LVAD. Patient's wife states that he wishes to die  at home although they are still coming to grips with the fact  that this is end-of-life with a very make a conscious decision to disconnect LVAD or not. Supported his wife in that withdrawing LVAD was not suicide, normalized this in terms of other care decisions that we make concerning a variety of illnesses such as stopping dialysis, chemotherapy causing more risks than benefits, etc. Options presented as follows: 1. Withdrawal care here in the hospital where we have the support staff, LVAD team, nursing that is trained in this type of care as well as palliative medicine 2. Wife states that patient wouldn't wish to die at home, this can be made to work but would recommend that hospice of [facility] (this will be patient's choice), has not had an LVAD patient, would need to meet with LVAD team prior to discharge home or to their inpatient facility in [place] . Also LVAD team would need to come ut at time of deactivation to disconnect alarm system.  3.I do feel as though patient will need continuous infusions started to ensure comfort before deactivating 4. Deactivate ICD  I did discourage patient from going to a long-term care facility for the following reasons: The acuity of his condition, with an LVAD, nearing end-of-life, I feel that to maintain symptom comfort this patient would be challenged in that environment and would likely have to return to the hospital and undergo that stress at end-of-life  Recommendations/Plan:  Pain: Verbalizing more pain. On scheduled tramadol 100 q8 ATC and fentanyl 25 mcg q3 PRN. Recommend increasing fentanyl range to 25-50 mcg q3 PRN. Pt has used 125 mcg in 24 hrs. Tramadol is serotonergic so use caution with other serotonin drugs such as SSRI's, Remeron, as well as antianxiety, opioids, dextromethoraphan  Goals of Care and Additional Recommendations:  Limitations on Scope of Treatment: No Artificial Feeding, No Chemotherapy, No Radiation, No Surgical Procedures and No Tracheostomy  Code Status:    Code Status Orders         Start     Ordered   05/09/17 1531  Do not attempt resuscitation (DNR)  Continuous    Question Answer Comment  In the event of cardiac or respiratory ARREST Do not call a "code blue"   In the event of cardiac or respiratory ARREST Do not perform Intubation, CPR, defibrillation or ACLS   In the event of cardiac or respiratory ARREST Use medication by any route, position, wound care, and other measures to relive pain and suffering. May use oxygen, suction and manual treatment of airway obstruction as needed for comfort.      05/09/17 1530    Code Status History    Date Active Date Inactive Code Status Order ID Comments User Context   04/03/2017  5:21 PM 05/09/2017  3:30 PM Partial Code [id number] Ventricular Assist Device in place.  No Chest Compressions. Annamaria Helling Inpatient   12/13/2011  4:43 PM 12/18/2011  7:39 PM Full Code [id number]  Jerald Kief, RN Inpatient    Advance Directive Documentation     Most Recent Value  Type of Advance Directive  Healthcare Power of Attorney, Living will  Pre-existing out of facility DNR order (yellow form or pink MOST form)  -  "MOST" Form in Place?  -       Prognosis:   Unable to determine. He would have a prognosis of hours to days if he were to disconnect LVAD  Discharge Planning:  To Be Determined  Care plan was discussed  with bedside RN  Thank you for allowing the Palliative Medicine Team to assist in the care of this patient.   Time In: 0930 Time Out: 1100 Total Time 90 min Prolonged Time Billed  yes       Greater than 50%  of this time was spent counseling and coordinating care related to the above assessment and plan.  Dory Horn, NP  Please contact Palliative Medicine Team phone at [phone] for questions and concerns.

## 2017-05-10 NOTE — Progress Notes (Signed)
LVAD Coordinator inpatient rounding note:  Admitted 06/16/18due to suspected GIB due to black, tarry stools.   Septic/PNA- antibiotics stopped on 5/29.   HeartMate II LVAD implanted on 5/2013by [facility].   Vital signs: HR: 64 Doppler Pressure: 88 Automatic BP: Not able to perform O2 Sat: 93 on 2 LNC Wt in lbs:  196.....205>207>208>215>209>213>214>216  LVAD interrogation reveals:  Speed: 9400 Flow: 6 Power: 6 PI: 4.7 Alarms: none Events:2-5 daily Fixed speed: 9400 Low speed limit: 8800  Drive Line: Weekly. Performed by wife; next dressing change due 05/16/17  Gtts:  Amiodarone 30 mg/hr - started 04/30/17, stopped 6/2   Labs:  LDH trend:143>137>200>131>139>129>129>125>128>137>137>126>154>144  INR trend: 4.12>3.94>3.99>1.77>1.79>2.19>2.27>2.15>2.37>2.60>2.33>2.00>1.73>1.66  Anticoagulation Plan: -INR Goal: 2-3 -ASA Dose: none due to GIB history  Adverse Events on VAD: -07/2013>GIB  Plan/Recommendations:   1. Weekly dressing changes. Next due 6/14. 2. Will likely need SNF placement 3. Palliative consult completed.  Marcellus Scott RN, VAD Coordinator 24/7 pager [phone]

## 2017-05-10 NOTE — Care Management Note (Addendum)
Case Management Note Donn Pierini RN, BSN Unit 2W-Case Manager-- 2H coverage [phone]  Patient Details  Name: [name] MRN: [id number] Date of Birth: 10/03/1941  Subjective/Objective:  LVAD pt admitted with symptomatic anemia- hgb 6.6 - transfused 2 U- hgb now 8.2-                    Action/Plan: PTA pt lived at home with wife- anticipate return home- CM to follow for d/c needs  Expected Discharge Date:                  Expected Discharge Plan:     In-House Referral:  Clinical Social Work  Discharge planning Services  CM Consult  Post Acute Care Choice:    Choice offered to:     DME Arranged:    DME Agency:     HH Arranged:    HH Agency:     Status of Service:  In process, will continue to follow  If discussed at Long Length of Stay Meetings, dates discussed:  6/5, 6/7  Discharge Disposition:   Additional Comments:  05/16/2017- 1500- Donn Pierini RN, CM- noted plans for comfort care- hospital death now expected with withdrawal of LVAD later today.   05/10/17- 1400- Aleira Deiter RN, CM- f/u done with pt's wife and daughter at bedside (pt sleeping)- family had meeting with PC this AM- pt has been made DNR and per conversation with wife and daughter they state that they are leaning towards either home with hospice or hospice facility- working with [facility] and LVAD team on this- live in [facility]- they state that they are no longer interested in LTAC or rehab options and no longer have plans to tour any facilities for rehab with focus being more on comfort care and not rehab at this time. CM remains available for d/c planning needs once family has made decision on final plan for discharge.  Have notified [facility] rep regarding updated d/c plans  05/09/17- 1100 Tekia [place] Charity fundraiser, [employer]- per [employer]- pt may be LTAC appropriate- have received referral for possible LTAC placement per MD- [facility] not take LTAC pt- [facility] does- have spoken with pt and wife-Martha at  the bedside-Long discussion on pts goal to return home and rehab options including CIR- discussed options of LTAC vs SNF with ultimate goal of pt going home with wife. Gave pt and wife options for facilities that have been trained for LVADs which are [facility] for LTAC and [facility] for SNF- wife is hesitant regarding [facility] (had a friend to expire there 8 yrs ago) but is agreeable to speak with the liaison- she also wants to go see the 2 SNF options that will take LVAD pts- will have CSW come pt to speak with pt and wife regarding the SNF options. Have placed call to [facility] liaison who will come see pt and wife today. PC consult has also been made per rounding team. CM will continue to follow for d/c needs.   05/07/17- 1245- Whitleigh Garramone RN, CM-  Noted MD note regarding skin - Extensive partial thickness skin loss to penis, scrotum, and groin.- very weak and edematous-  Remains very tenuous- most likely will need SNF- will have CSW follow.   Darrold Span, RN 05/10/2017, 2:18 PM

## 2017-05-10 NOTE — Progress Notes (Signed)
OT Cancellation Note  Patient Details Name: [name] MRN: [id number] DOB: [birth year]   Cancelled Treatment:    Reason Eval/Treat Not Completed: Pain limiting ability to participate.   Will try back as appropriate.  Tanzie Rothschild [place], OTR/L [phone]   Jeani Hawking M 05/10/2017, 11:34 AM

## 2017-05-11 LAB — CBC
HCT: 28 % — ABNORMAL LOW (ref 39.0–52.0)
HEMOGLOBIN: 8.3 g/dL — AB (ref 13.0–17.0)
MCH: 27.8 pg (ref 26.0–34.0)
MCHC: 29.6 g/dL — ABNORMAL LOW (ref 30.0–36.0)
MCV: 93.6 fL (ref 78.0–100.0)
PLATELETS: 240 10*3/uL (ref 150–400)
RBC: 2.99 MIL/uL — AB (ref 4.22–5.81)
RDW: 19.1 % — ABNORMAL HIGH (ref 11.5–15.5)
WBC: 9.2 10*3/uL (ref 4.0–10.5)

## 2017-05-11 LAB — BASIC METABOLIC PANEL
ANION GAP: 6 (ref 5–15)
BUN: 60 mg/dL — AB (ref 6–20)
CHLORIDE: 96 mmol/L — AB (ref 101–111)
CO2: 30 mmol/L (ref 22–32)
Calcium: 8.1 mg/dL — ABNORMAL LOW (ref 8.9–10.3)
Creatinine, Ser: 1.72 mg/dL — ABNORMAL HIGH (ref 0.61–1.24)
GFR, EST AFRICAN AMERICAN: 42 mL/min — AB (ref >60)
GFR, EST NON AFRICAN AMERICAN: 36 mL/min — AB (ref >60)
Glucose, Bld: 82 mg/dL (ref 65–99)
Potassium: 4.8 mmol/L (ref 3.5–5.1)
SODIUM: 132 mmol/L — AB (ref 135–145)

## 2017-05-11 LAB — LACTATE DEHYDROGENASE: LDH: 151 U/L (ref 98–192)

## 2017-05-11 LAB — PROTIME-INR
INR: 1.97
PROTHROMBIN TIME: 22.7 s — AB (ref 11.4–15.2)

## 2017-05-11 MED ORDER — WARFARIN SODIUM 2 MG PO TABS: 2.0000 mg | ORAL_TABLET | Freq: Once | ORAL | Status: DC

## 2017-05-11 MED ORDER — FENTANYL CITRATE (PF) 100 MCG/2ML IJ SOLN
50.0000 ug | INTRAMUSCULAR | Status: DC | PRN
  Administered 2017-05-11 – 2017-05-12 (×4): 50 ug via INTRAVENOUS
  Filled 2017-05-11 (×5): qty 2

## 2017-05-11 MED ORDER — WARFARIN SODIUM 2 MG PO TABS
1.0000 mg | ORAL_TABLET | Freq: Once | ORAL | Status: AC
  Administered 2017-05-11: 1 mg via ORAL
  Filled 2017-05-11: qty 0.5

## 2017-05-11 NOTE — Progress Notes (Signed)
Patient ID: [name], male   DOB: 1941-02-18, 76 y.o.   MRN: [id number]   Advanced Heart Failure VAD Team Note  Subjective:    Admitted 22-May-2017 with black, tarry stools and Hgb 6.6.  Septic/PNA- antibiotics stopped on 5/29.   Palliative Care consulted for GOC. Now DNR, considering hospice care.   Still pending BMET and LDH today.  Hgb stable.   Ongoing delirium.  Does not know where he is.  Complains of pain "all over."  Especially bad when he is turned.    LVAD INTERROGATION:  HeartMate II LVAD:  Flow 5.7 liters/min, speed 9400, power 6.1 PI 4.3.  No PI events.   Objective:    Vital Signs:   Temp:  [98.4 F (36.9 C)-99.4 F (37.4 C)] 98.8 F (37.1 C) (06/09 0752) Pulse Rate:  [54-109] 78 (06/09 0600) Resp:  [8-22] 11 (06/09 0600) SpO2:  [89 %-100 %] 96 % (06/09 0726) Weight:  [215 lb (97.5 kg)] 215 lb (97.5 kg) (06/09 0500) Last BM Date: 05/07/17 Mean arterial Pressure 70-80s  Intake/Output:   Intake/Output Summary (Last 24 hours) at 05/11/17 0952 Last data filed at 05/11/17 0600  Gross per 24 hour  Intake              100 ml  Output             1000 ml  Net             -900 ml    Physical Exam: CVP 11 GENERAL: Chronically ill. In bed. Moaning with movement.  HEENT: normal  NECK: Supple, JVP 10-12 cm.  2+ bilaterally, no bruits.  No lymphadenopathy or thyromegaly appreciated.   CARDIAC:  Irregular. Mechanical heart sounds with LVAD hum present.  LUNGS: Dependent crackles.   ABDOMEN:  Soft, round, nontender, positive bowel sounds x4.     LVAD exit site: well-healed and incorporated.  Dressing dry and intact.  No erythema or drainage.  Stabilization device present and accurately applied.  Driveline dressing is being changed daily per sterile technique. EXTREMITIES:  Warm and dry, no cyanosis, clubbing, R and LLE 1+ edema. RUE PICC NEUROLOGIC:  Oriented to self not place. Affect flat.     Telemetry:  A Fib 60-80s with PVCs  Personally reviewed.   Labs: Basic Metabolic Panel:  Recent Labs Lab 05/06/17 0351 05/07/17 0349 05/08/17 0358 05/09/17 0434 05/10/17 0454  NA 138 136 135 133* 133*  K 5.0 4.4 5.1 5.4* 4.7  CL 106 102 101 98* 96*  CO2 26 28 30 29 29   GLUCOSE 88 83 84 79 93  BUN 62* 64* 59* 60* 62*  CREATININE 1.89* 1.93* 1.76* 1.79* 2.04*  CALCIUM 7.5* 7.6* 7.9* 8.1* 8.0*    Liver Function Tests: No results for input(s): AST, ALT, ALKPHOS, BILITOT, PROT, ALBUMIN in the last 168 hours. No results for input(s): LIPASE, AMYLASE in the last 168 hours.  Recent Labs Lab 05/10/17 1127  AMMONIA 12    CBC:  Recent Labs Lab 05/08/17 0358 05/08/17 2106 05/09/17 0434 05/10/17 0454 05/11/17 0552  WBC 6.7 7.1 7.6 9.6 9.2  HGB 7.7* 8.7* 8.6* 8.6* 8.3*  HCT 26.0* 29.6* 29.4* 28.6* 28.0*  MCV 93.9 94.0 93.9 93.2 93.6  PLT 227 232 244 245 240    INR:  Recent Labs Lab 05/07/17 0349 05/08/17 0358 05/09/17 0434 05/10/17 0454 05/11/17 0552  INR 2.33 2.00 1.73 1.66 1.97    Other results:   Imaging: No results found.   Medications:  Scheduled Medications: . calcium carbonate  1 tablet Oral BID WC  . cholecalciferol  1,000 Units Oral Daily  . febuxostat  40 mg Oral Daily  . feeding supplement (ENSURE ENLIVE)  237 mL Oral BID BM  . ferrous sulfate  325 mg Oral Q breakfast  . furosemide  40 mg Oral BID  . Gerhardt's butt cream   Topical TID  . hydroxychloroquine  400 mg Oral QPC breakfast  . mirtazapine  7.5 mg Oral QHS  . mometasone-formoterol  2 puff Inhalation BID  . pantoprazole  40 mg Oral BID  . pravastatin  40 mg Oral Daily  . sodium chloride flush  10-40 mL Intracatheter Q12H  . tamsulosin  0.4 mg Oral Daily  . traMADol  100 mg Oral Q8H  . warfarin  2 mg Oral ONCE-1800  . Warfarin - Pharmacist Dosing Inpatient   Does not apply q1800    Infusions: . ferumoxytol Stopped (05/10/17 1200)  . fluconazole (DIFLUCAN) IV Stopped (05/10/17 1103)    PRN Medications: acetaminophen,  albuterol, diphenhydrAMINE, fentaNYL (SUBLIMAZE) injection, hydrOXYzine, Melatonin, ondansetron (ZOFRAN) IV, sodium chloride flush   Assessment/Plan   1. 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 Noted to have functional SBO with 3 retained capsule cameras.  Has had multiple GI work-ups at [facility]. Would not repeat scope unless rebleeds. - Has had multiple transfusions this admission. Checked iron stores end of June. Continue Iron daily.  - Today's Hgb is 8.3, fairly stable.  2. Acute on chronic systolic CHF due to ICM s/p LVAD 04/2012 at [facility] for DT.  Transitioned yesterday to po Lasix with rising creatinine.  - He is volume overloaded on exam with CVP 11.  BMET not back yet this morning.  For now, would continue Lasix 40 mg po bid.  3. Chronic anticoagulation for LVAD/afib:  - Goal INR 1.7-2.3, no ASA, INR 1.9 today.  Continue coumadin. 4. Chronic Afib:  On coumadin. Rate controlled. Rate controlled.     5. Shock Septic with productive cough: suspect HCAP.  CXR with L basilar airspace opacity.  PCT falling,  Off pressors. Antibiotics stopped 5/29. 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 He had 7 days of antibiotics.  6. AKI on CKD IV: Pending creatinine this morning, resend BMET.    7. Suspicious for SJS versus Drug Rash: Slowly improving. Severe malnutrition and incontinence limiting.  - Continue silicone dressing to R forearm and buttock.  - Continue Gerhardt creams to perianal skin - Continue interdry to groin. Continue IV diflucan.  - Continue low air loss overload and reposition R to L only.  - Elevated heels to prevent pressure ulcers. Add Prevalon Boot to R and LLE .  8. Depression 9. NSVT - Off amio.  10. Severe Malnutrition: Nutrition following. Prealbumin falling 6.2. Continue high protein foods. No change.  11.  Urinary Incontinence: Continue foley for now until skin issues improved.  12. Candidiasis: Groin.  Continue IV diflucan per pharmacy.  13. Suspect Deep Tissue Injury --->L Dorsum of Foot nonblanching purple area. Will need to keep compression off. Elevate heels.  14. Delirium: Ongoing.  15. DNR.  Palliative care now following.  He continues to decline with poor nutritional status and delirium. May be able to go to hospice house in [place].  Main complaint is pain, will increase Fentanyl.  I reviewed the LVAD parameters from today, and compared the results to the patient's prior recorded data.  No programming changes were made.  The LVAD is functioning within specified parameters.  The patient performs LVAD self-test daily.  LVAD interrogation was negative for any significant power changes, alarms or PI events/speed drops.  LVAD equipment check completed and is in good working order.  Back-up equipment present.   LVAD education done on emergency procedures and precautions and reviewed exit site care.  Length of Stay: 63  Marca Ancona, MD 05/11/2017, 9:52 AM  VAD Team --- VAD ISSUES ONLY--- Pager [phone] (7am - 7am)  Advanced Heart Failure Team  Pager [phone] (M-F; 7a - 4p)  Please contact [facility] for night-coverage after hours (4p -7a ) and weekends on [email or web address]

## 2017-05-11 NOTE — Progress Notes (Signed)
Daily Progress Note   Patient Name: [name]       Date: 05/11/2017 DOB: 1941-08-02  Age: 76 y.o. MRN#: [id number] Attending Physician: Jolaine Artist, MD Primary Care Physician: Glenda Chroman, MD Admit Date: 04/21/2017  Reason for Consultation/Follow-up: Disposition, Establishing goals of care and Psychosocial/spiritual support  Subjective: [name] was minimally interactive during my visit. He socially responded, but did not answer any other questions beyond his name. He endorses pain "everywhere," but is unable to explain pain characteristics or aggrivating/relieving factors. No family present during my initial visit, however present on my return visit for a scheduled [place] meeting.   Length of Stay: 18  Current Medications: Scheduled Meds:  . calcium carbonate  1 tablet Oral BID WC  . cholecalciferol  1,000 Units Oral Daily  . febuxostat  40 mg Oral Daily  . feeding supplement (ENSURE ENLIVE)  237 mL Oral BID BM  . ferrous sulfate  325 mg Oral Q breakfast  . furosemide  40 mg Oral BID  . Gerhardt's butt cream   Topical TID  . hydroxychloroquine  400 mg Oral QPC breakfast  . mirtazapine  7.5 mg Oral QHS  . mometasone-formoterol  2 puff Inhalation BID  . pantoprazole  40 mg Oral BID  . pravastatin  40 mg Oral Daily  . sodium chloride flush  10-40 mL Intracatheter Q12H  . tamsulosin  0.4 mg Oral Daily  . warfarin  1 mg Oral ONCE-1800  . Warfarin - Pharmacist Dosing Inpatient   Does not apply q1800    Continuous Infusions: . ferumoxytol Stopped (05/10/17 1200)  . fluconazole (DIFLUCAN) IV Stopped (05/11/17 1247)    PRN Meds: acetaminophen, albuterol, diphenhydrAMINE, fentaNYL (SUBLIMAZE) injection, hydrOXYzine, Melatonin, ondansetron (ZOFRAN) IV, sodium chloride flush  Physical Exam  Constitutional: He appears  distressed (moaning in bed after being repositioned).  HENT:  Head: Normocephalic and atraumatic.  Mouth/Throat: Mucous membranes are dry. Abnormal dentition. No oropharyngeal exudate.  Eyes: EOM are normal.  Neck: Normal range of motion. Neck supple.  Cardiovascular:  LVAD  Pulmonary/Chest: Effort normal. No respiratory distress. He has decreased breath sounds in the right lower field and the left lower field. He has no wheezes.  Abdominal: Soft. Bowel sounds are normal.  Musculoskeletal: He exhibits edema (BLE 1-2+).  UTA, pt not following commands. Pain with passive movement.  Neurological:  Opens eyes to voice, oriented to self only. Socially responsive, but minimally engaging otherwise. More responsive with family.   Skin: Skin is warm and dry. There is pallor.  PICC           Vital Signs: BP (!) 79/61 (BP Location: Left Arm)   Pulse 78   Temp 99.4 F (37.4 C) (Oral)   Resp 11   Ht '5\' 7"'$  (1.702 m)   Wt 97.5 kg (215 lb)   SpO2 96%   BMI 33.67 kg/m  SpO2: SpO2: 96 % O2 Device: O2 Device: Nasal Cannula O2 Flow Rate: O2 Flow Rate (L/min): 2 L/min  Intake/output summary:  Intake/Output Summary (Last 24 hours) at 05/11/17 1500 Last data filed at 05/11/17 1200  Gross per 24 hour  Intake  560 ml  Output              750 ml  Net             -190 ml   LBM: Last BM Date: 05/07/17 Baseline Weight: Weight: 88.9 kg (196 lb) Most recent weight: Weight: 97.5 kg (215 lb)  Palliative Assessment/Data: PPS 20-30%   Flowsheet Rows     Most Recent Value  Intake Tab  Referral Department  Cardiology  Unit at Time of Referral  ICU  Palliative Care Primary Diagnosis  Cardiac  Date Notified  05/09/17  Palliative Care Type  New Palliative care  Reason for referral  Clarify Goals of Care  Date of Admission  05/02/2017  Date first seen by Palliative Care  05/09/17  # of days Palliative referral response time  0 Day(s)  # of days IP prior to Palliative referral  16    Clinical Assessment  Psychosocial & Spiritual Assessment  Palliative Care Outcomes      Patient Active Problem List   Diagnosis Date Noted  . 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 Protein-calorie malnutrition, severe (HCC) 05/09/2017  . Acute on chronic systolic (congestive) heart failure (HCC)   . Palliative care by specialist   . Goals of care, counseling/discussion   . Advance care planning   . Healthcare-associated pneumonia   . Chronic kidney disease (CKD), stage IV (severe) (HCC)   . Tachycardia   . Tachypnea   . Symptomatic anemia 04/26/2017  . Iron deficiency anemia due to chronic blood loss 02/11/2017  . Malabsorption of iron 02/11/2017  . Anemia of chronic renal failure, stage 4 (severe) (HCC) 02/11/2017  . Mass of pancreas 09/01/2015  . Falls 08/17/2015  . LVAD (left ventricular assist device) present (HCC) 09/15/2014  . Fatigue 03/18/2014  . GI bleed 07/14/2013  . Implantable cardiac defibrillator lead failure 11/20/2012  . ICD-Medtronic 11/13/2012  . Gout 11/12/2012  . Acquired von Willebrand disease (HCC) 05/27/2012  . Mitral regurgitation 11/20/2011  . Renal insufficiency 11/20/2011  . Edema 08/28/2011  . Peripheral neuropathy 08/28/2011  . Long term current use of anticoagulant therapy 02/23/2011  . SOB (shortness of breath) 12/21/2010  . POSTSURG PERCUT TRANSLUMINAL COR ANGPLSTY STS 12/21/2010  . CHRONIC SYSTOLIC HEART FAILURE 11/24/2010  . ATRIAL FIBRILLATION 10/13/2010  . DM 10/11/2009  . OBSTRUCTIVE SLEEP APNEA 10/11/2009  . CORONARY ARTERY DISEASE, S/P PTCA 10/11/2009  . DVT 10/11/2009  . PULMONARY NODULE 10/11/2009  . MESENTERIC VENOUS THROMBOSIS 10/11/2009  . OTH SPEC D/O RESULT FROM IMPAIRED RENAL FUNCTION 10/11/2009  . PULMONARY EMBOLISM, HX OF 10/11/2009  . HYPERLIPIDEMIA-MIXED 08/16/2009  . CAD, NATIVE VESSEL 08/16/2009  . Hypotension 08/16/2009    Palliative Care Assessment & Plan   HPI: 76 y.o.malewith past medical  history of CAD, ICM, A. Fib, sCHF on LVAD (placed at [facility] 04/2012), CKD, chronic anemia (recurrent GI bleed- workup in 11/2016 at [facility] showed large AVM in duodenum not able to be revisualized on endo), and functional SBO with 3 retained capsule cameras in abdominal hernia. He presented to his hematologist for planned iron infusion. In that visit Hgb 6.6 with wife noting ~1 week of dark and tarry stools with worsening fatigue. He was admitted on 04/12/2017. Work-up revealed anemia due to recurrent GI bleed, and septic shock. 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 He has received multiple blood transfusions during admission. Functional recovery has been minimal. He does not qualify for CIR- not  able to withstand intense rehab. Palliative medicine consulted for [place].   Assessment: I met again with [name] family: his wife, Jana Half, daughter Golden Circle, and 37 wife Sophia. We talked through their understanding of the conversation they had yesterday with Romona Curls (Palliative NP).  Jana Half was extremely tearful throughout our conversation. She understood that her husband was likely nearing the end of his life, and they had options for further care. Those options included ongoing aggressive treatment, focusing on comfort and, in the context of comfort care, turning off the LVAD.   We talked through each of these options again. I specifically explained that I did not believe going home was an option (either for ongoing aggressive care or comfort care). I believe his medical needs are too significant for home management, nor could his symptoms be managed at home effectively. I also explained that comfort care could occur at a Hospice facility, or he could remain here. Given the LVAD, I recommended him staying here if the plan was to turn it off. His team is here, the nurses are trained in LVAD management, and his symptoms can be aggressively and quickly managed.    Jana Half was able to hear this information, but she continues to have significant emotional strain in having to decide between these options. She recognizes that the goal of the LVAD was to provide him with a certain quality of life, which he had verbalized as a desire for some independence, and the ability to get out and do things. She recognizes he is not going to get back to his prior functional level, and that he has slowly been deteriorating over the past few months. She expresses that he would not want to live in a state of dependency and debility. That said, she has intense guilt and sadness in considering transitioning to comfort focused care. She is not yet ready to make a decision. Their daughter, Golden Circle, is very clear on her father's wishes and feels strongly that comfort focused care is the right approach. She is trying to support her mother while still advocating for her father's previously stated wishes.   Recommendations/Plan:  DNR, please ensure ICD has been deactivated  Family still processing options, plan to re-meet at 1400 on 6/10. It may be helpful to have someone from Heart Failure team present to lend their opinion and recommendation  Code Status:  DNR  Prognosis:   Unable to determine  Discharge Planning:  To Be Determined  Care plan was discussed with pt (though likely poor ability to understand), pt's wife, daughter, and daughter-in law, and care nurse.  Thank you for allowing the Palliative Medicine Team to assist in the care of this patient.  Time in: 1400 Time out: 1530 Total time: 105 minutes    Greater than 50%  of this time was spent counseling and coordinating care related to the above assessment and plan.  Charlynn Court, NP Palliative Medicine Team [phone] pager (7a-5p) Team Phone # [phone]

## 2017-05-11 NOTE — Progress Notes (Signed)
   05/11/17 0700  Clinical Encounter Type  Visited With Patient;Patient and family together;Health care provider  Visit Type Initial;Psychological support;Spiritual support;Social support  Referral From Nurse  Consult/Referral To Chaplain  Spiritual Encounters  Spiritual Needs Prayer;Emotional  Stress Factors  Patient Stress Factors Loss of control;Major life changes  Family Stress Factors Loss of control;Major life changes  Chaplain was paged to provide emotional and spiritual support to a family who was reported to be tearfully throughout the day. Chaplain sat down with Pt and listened to his fears, and concerns. The Pt's wife and daughter was at bedside and we all prayed together. Chaplain chatted (briefly) about the importance of self-care. Wife agreed and said she was going to go home for the night. Family still aren't clear about the best options for the Pt in relationship to the Pt's goals of care. Family is leaning towards comfort care but also have hopes for God's healing.

## 2017-05-11 NOTE — Progress Notes (Signed)
ANTICOAGULATION CONSULT NOTE - Follow Up Consult  Pharmacy Consult for Warfarin Indication: LVAD  Patient Measurements: Height: 5\' 7"  (170.2 cm) Weight: 215 lb (97.5 kg) IBW/kg (Calculated) : 66.1  Vital Signs: Temp: 98.8 F (37.1 C) (06/09 0752) Temp Source: Oral (06/09 0752) Pulse Rate: 78 (06/09 0600)  Labs:  Recent Labs  05/09/17 0434 05/10/17 0454 05/11/17 0552  HGB 8.6* 8.6* 8.3*  HCT 29.4* 28.6* 28.0*  PLT 244 245 240  LABPROT 20.5* 19.8* 22.7*  INR 1.73 1.66 1.97  CREATININE 1.79* 2.04*  --     Estimated Creatinine Clearance: 33.2 mL/min (A) (by C-G formula based on SCr of 2.04 mg/dL (H)).  Assessment: 76 yo male with LVAD on chronic warfarin.  Admitted with supratherapeutic INR of 4.12 and GIB. Warfarin was held 5/22-5/27 and restarted 5/28.   INR trending up today to 1.9. He is also on fluconazole for a fungal superinfection of his groin. Fluconazole can elevate INR so will need to watch closely. Continues on PO amiodarone.   Hgb low but stable. LDH stable. No obvious bleeding. Pre-albumin low 6.2 (down)  PTA Warfarin dose 2 mg daily.  Per patient report was recently decreased from 2 mg daily except 3 mg on MWF. No aspirin.  Goal of Therapy:  INR 1.7-2.3 (per [facility]) Monitor platelets by anticoagulation protocol: Yes   Plan:  1) Warfarin 2 mg x 1 tonight. 2) Daily INR, CBC, LDH  Sheppard Coil PharmD., BCPS Clinical Pharmacist Pager [phone] 05/11/2017 8:13 AM

## 2017-05-12 ENCOUNTER — Inpatient Hospital Stay (HOSPITAL_COMMUNITY): Payer: Medicare Other

## 2017-05-12 DIAGNOSIS — R0600 Dyspnea, unspecified: Secondary | ICD-10-CM

## 2017-05-12 DIAGNOSIS — R0689 Other abnormalities of breathing: Secondary | ICD-10-CM

## 2017-05-12 LAB — BASIC METABOLIC PANEL
ANION GAP: 6 (ref 5–15)
BUN: 63 mg/dL — ABNORMAL HIGH (ref 6–20)
CALCIUM: 8 mg/dL — AB (ref 8.9–10.3)
CO2: 30 mmol/L (ref 22–32)
Chloride: 97 mmol/L — ABNORMAL LOW (ref 101–111)
Creatinine, Ser: 2.05 mg/dL — ABNORMAL HIGH (ref 0.61–1.24)
GFR, EST AFRICAN AMERICAN: 34 mL/min — AB (ref >60)
GFR, EST NON AFRICAN AMERICAN: 29 mL/min — AB (ref >60)
GLUCOSE: 99 mg/dL (ref 65–99)
Potassium: 5.2 mmol/L — ABNORMAL HIGH (ref 3.5–5.1)
Sodium: 133 mmol/L — ABNORMAL LOW (ref 135–145)

## 2017-05-12 LAB — PROTIME-INR
INR: 2.21
Prothrombin Time: 24.9 seconds — ABNORMAL HIGH (ref 11.4–15.2)

## 2017-05-12 LAB — CBC
HCT: 30.2 % — ABNORMAL LOW (ref 39.0–52.0)
Hemoglobin: 9 g/dL — ABNORMAL LOW (ref 13.0–17.0)
MCH: 28 pg (ref 26.0–34.0)
MCHC: 29.8 g/dL — ABNORMAL LOW (ref 30.0–36.0)
MCV: 94.1 fL (ref 78.0–100.0)
PLATELETS: 297 10*3/uL (ref 150–400)
RBC: 3.21 MIL/uL — ABNORMAL LOW (ref 4.22–5.81)
RDW: 19.2 % — AB (ref 11.5–15.5)
WBC: 28.4 10*3/uL — AB (ref 4.0–10.5)

## 2017-05-12 LAB — GLUCOSE, CAPILLARY: Glucose-Capillary: 83 mg/dL (ref 65–99)

## 2017-05-12 LAB — PROCALCITONIN: Procalcitonin: 3.78 ng/mL

## 2017-05-12 LAB — LACTATE DEHYDROGENASE: LDH: 153 U/L (ref 98–192)

## 2017-05-12 MED ORDER — FUROSEMIDE 10 MG/ML IJ SOLN
80.0000 mg | Freq: Two times a day (BID) | INTRAMUSCULAR | Status: DC
  Administered 2017-05-12 – 2017-05-13 (×2): 80 mg via INTRAVENOUS
  Filled 2017-05-12 (×2): qty 8

## 2017-05-12 MED ORDER — FENTANYL CITRATE (PF) 100 MCG/2ML IJ SOLN
50.0000 ug | INTRAMUSCULAR | Status: DC | PRN
  Administered 2017-05-13: 50 ug via INTRAVENOUS
  Filled 2017-05-12: qty 2

## 2017-05-12 MED ORDER — MIDAZOLAM HCL 2 MG/2ML IJ SOLN: 2.0000 mg | INTRAMUSCULAR | Status: DC | PRN

## 2017-05-12 MED ORDER — ACETAMINOPHEN 10 MG/ML IV SOLN
1000.0000 mg | Freq: Once | INTRAVENOUS | Status: AC
  Administered 2017-05-12: 1000 mg via INTRAVENOUS
  Filled 2017-05-12: qty 100

## 2017-05-12 MED ORDER — FENTANYL CITRATE (PF) 100 MCG/2ML IJ SOLN
25.0000 ug | INTRAMUSCULAR | Status: DC
  Administered 2017-05-12 – 2017-05-13 (×7): 25 ug via INTRAVENOUS
  Filled 2017-05-12 (×7): qty 2

## 2017-05-12 NOTE — Progress Notes (Signed)
Spoke with Dr.Taylor concerning patients elevated temp, heart rate, drowsiness/lethargy and the need to increase O2. Had texted MD twice through [facility] without recognition of reading failed message. Orders received

## 2017-05-12 NOTE — Progress Notes (Signed)
ANTICOAGULATION CONSULT NOTE - Follow Up Consult  Pharmacy Consult for Warfarin Indication: LVAD  Patient Measurements: Height: 5\' 7"  (170.2 cm) Weight: 215 lb (97.5 kg) IBW/kg (Calculated) : 66.1  Vital Signs: Temp: 98.6 F (37 C) (06/10 0751) Temp Source: Oral (06/10 0751) Pulse Rate: 103 (06/10 0630)  Labs:  Recent Labs  05/10/17 0454 05/11/17 0552 05/12/17 0500  HGB 8.6* 8.3* 9.0*  HCT 28.6* 28.0* 30.2*  PLT 245 240 297  LABPROT 19.8* 22.7* 24.9*  INR 1.66 1.97 2.21  CREATININE 2.04* 1.72* 2.05*    Estimated Creatinine Clearance: 33.1 mL/min (A) (by C-G formula based on SCr of 2.05 mg/dL (H)).  Assessment: 76 yo male with LVAD on chronic warfarin.  Admitted with supratherapeutic INR of 4.12 and GIB. Warfarin was held 5/22-5/27 and restarted 5/28.   INR trending up today to 2.2. He is also on fluconazole for a fungal superinfection of his groin. Fluconazole can elevate INR so will need to watch closely. Continues on PO amiodarone.   Hgb low but stable. LDH stable. No obvious bleeding. Pre-albumin low 6.2 (down)  PTA Warfarin dose 2 mg daily.  Per patient report was recently decreased from 2 mg daily except 3 mg on MWF. No aspirin.  Goal of Therapy:  INR 1.7-2.3 (per [facility]) Monitor platelets by anticoagulation protocol: Yes   Plan:  1) Hold warfarin tonight 2) Daily INR, CBC, LDH  Sheppard Coil PharmD., BCPS Clinical Pharmacist Pager [phone] 05/12/2017 7:56 AM

## 2017-05-12 NOTE — Progress Notes (Signed)
Daily Progress Note   Patient Name: [name]       Date: 05/12/2017 DOB: 1941-06-03  Age: 76 y.o. MRN#: [id number] Attending Physician: Jolaine Artist, MD Primary Care Physician: Glenda Chroman, MD Admit Date: 04/08/2017  Reason for Consultation/Follow-up: Establishing goals of care, Non pain symptom management, Pain control, Psychosocial/spiritual support and Terminal Care  Subjective: Patient has had an acute decline overnight, likely developing sepsis. He spiked a fever of upwards of 103. Leukocytosis, white blood cell count today 28.4; was 9.2 05/11/2017. His creatinine is 2.05. His wife is at the bedside along with her good friend Stanton Kidney. Jana Half, shares with me that her husband has been very lucid, talking about times. Patient's son is not due back from [place] until 05/15/17 around lunch time. His pain seems worse. He is utilizing fentanyl 50 g, 4 doses administered since noon yesterday. Chest x-ray repeated and shows large new right sided infiltrate in addition to left base. At this point there are no antibiotics available to treat his infection  Length of Stay: 19  Current Medications: Scheduled Meds:  . calcium carbonate  1 tablet Oral BID WC  . cholecalciferol  1,000 Units Oral Daily  . febuxostat  40 mg Oral Daily  . feeding supplement (ENSURE ENLIVE)  237 mL Oral BID BM  . ferrous sulfate  325 mg Oral Q breakfast  . furosemide  40 mg Oral BID  . Gerhardt's butt cream   Topical TID  . hydroxychloroquine  400 mg Oral QPC breakfast  . mirtazapine  7.5 mg Oral QHS  . mometasone-formoterol  2 puff Inhalation BID  . pantoprazole  40 mg Oral BID  . pravastatin  40 mg Oral Daily  . sodium chloride flush  10-40 mL Intracatheter Q12H  . tamsulosin  0.4 mg  Oral Daily  . Warfarin - Pharmacist Dosing Inpatient   Does not apply q1800    Continuous Infusions: . ferumoxytol Stopped (05/10/17 1200)  . fluconazole (DIFLUCAN) IV 400 mg (05/12/17 0828)    PRN Meds: acetaminophen, albuterol, diphenhydrAMINE, fentaNYL (SUBLIMAZE) injection, hydrOXYzine, Melatonin, ondansetron (ZOFRAN) IV, sodium chloride flush  Physical Exam  Constitutional: He appears well-developed and well-nourished.  Acutely ill appearing older man, transitioning  HENT:  Head: Normocephalic and atraumatic.  Cardiovascular:  Tachycardic AICD is still activated  Pulmonary/Chest: Effort  normal.  Abdominal: He exhibits distension.  Musculoskeletal: Normal range of motion.  Neurological: He is alert.  Skin: Skin is warm and dry. There is pallor.  Psychiatric:  Unable to test Is answering simple questions yes and no  Nursing note and vitals reviewed.           Vital Signs: BP (!) 79/61 (BP Location: Left Arm)   Pulse 93   Temp 98.6 F (37 C) (Oral)   Resp 13   Ht _0  (1.702 m)   Wt 97.5 kg (215 lb)   SpO2 98%   BMI 33.67 kg/m  SpO2: SpO2: 98 % O2 Device: O2 Device: Nasal Cannula O2 Flow Rate: O2 Flow Rate (L/min): 6 L/min  Intake/output summary:  Intake/Output Summary (Last 24 hours) at 05/12/17 7253 Last data filed at 05/12/17 6644  Gross per 24 hour  Intake             1710 ml  Output              350 ml  Net             1360 ml   LBM: Last BM Date: 05/07/17 Baseline Weight: Weight: 88.9 kg (196 lb) Most recent weight: Weight: 97.5 kg (215 lb)       Palliative Assessment/Data:    Flowsheet Rows     Most Recent Value  Intake Tab  Referral Department  Cardiology  Unit at Time of Referral  ICU  Palliative Care Primary Diagnosis  Cardiac  Date Notified  05/09/17  Palliative Care Type  New Palliative care  Reason for referral  Clarify Goals of Care  Date of Admission  04/06/2017  Date first seen by Palliative Care  05/09/17  # of days Palliative  referral response time  0 Day(s)  # of days IP prior to Palliative referral  16  Clinical Assessment  Psychosocial & Spiritual Assessment  Palliative Care Outcomes      Patient Active Problem List   Diagnosis Date Noted  . 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 Protein-calorie malnutrition, severe ([place]) 05/09/2017  . Acute on chronic systolic (congestive) heart failure ([place])   . Palliative care by specialist   . Goals of care, counseling/discussion   . Advance care planning   . Healthcare-associated pneumonia   . Chronic kidney disease (CKD), stage IV (severe) ([place])   . Tachycardia   . Tachypnea   . Symptomatic anemia 04/11/2017  . Iron deficiency anemia due to chronic blood loss 02/11/2017  . Malabsorption of iron 02/11/2017  . Anemia of chronic renal failure, stage 4 (severe) ([place]) 02/11/2017  . Mass of pancreas 09/01/2015  . Falls 08/17/2015  . LVAD (left ventricular assist device) present ([place]) 09/15/2014  . Fatigue 03/18/2014  . GI bleed 07/14/2013  . Implantable cardiac defibrillator lead failure 11/20/2012  . ICD-Medtronic 11/13/2012  . Gout 11/12/2012  . Acquired von Willebrand disease ([place]) 05/27/2012  . Mitral regurgitation 11/20/2011  . Renal insufficiency 11/20/2011  . Edema 08/28/2011  . Peripheral neuropathy 08/28/2011  . Long term current use of anticoagulant therapy 02/23/2011  . SOB (shortness of breath) 12/21/2010  . POSTSURG PERCUT TRANSLUMINAL COR ANGPLSTY STS 12/21/2010  . CHRONIC SYSTOLIC HEART FAILURE 03/47/4259  . ATRIAL FIBRILLATION 10/13/2010  . DM 10/11/2009  . OBSTRUCTIVE SLEEP APNEA 10/11/2009  . CORONARY ARTERY DISEASE, S/P PTCA 10/11/2009  . DVT 10/11/2009  . PULMONARY NODULE 10/11/2009  . MESENTERIC VENOUS THROMBOSIS 10/11/2009  . OTH SPEC D/O RESULT FROM IMPAIRED  RENAL FUNCTION 10/11/2009  . PULMONARY EMBOLISM, HX OF 10/11/2009  . HYPERLIPIDEMIA-MIXED 08/16/2009  . CAD, NATIVE VESSEL 08/16/2009  . Hypotension 08/16/2009     Palliative Care Assessment & Plan   Patient Profile: 75 y.o.malewith past medical history of CAD, ICM, A. 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 He presented to his hematologist for planned iron infusion. In that visit Hgb 6.6 with wife noting ~1 week of dark and tarry stools with worsening fatigue. He was admitted on 04/18/2017. Work-up revealed anemia due to recurrent GI bleed, and septic shock. 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 He has received multiple blood transfusions during admission. Functional recovery has been minimal. He does not qualify for CIR- not able to withstand intense rehab. Palliative medicine consulted for [place].  Palliative medicine team is now met with patient and family for 4 visits. Wife is really struggling with how to manage disconnecting LVAD at end-of-life. As noted above, patient has had an acute decline overnight where he has spiked a fever, leukocytosis. We are meeting today with daughter and spouse to discuss options.   Assessment: Patient, per wife, has been very lucid and interactive with her as well as a family friend at the bedside. He appears to be in a lot of pain with any touch, or movement. Palliative medicine was planning to meet with wife, daughter, and son over the phone at 2 PM today but we have moved this meeting up to 11 AM because of patient's clinical decline. Has noted above repeat CXR reveals large infiltrate on the right in addition to left base. There are no antibiotics available to treat this infection given patient's allergy profile.  Wife was able to verbalize she knows she is dying. There is no further discussion about patient leaving the hospital to go home with hospice or to go to  residential hospice at this point. She herself does not appear to be able to say to turn off the LVAD. She states "I'll be monitoring him". Darrick Grinder, nurse practitioner with the heart failure team was in our meeting today. Wife is struggling in that patient has been more alert overnight and he has been in several days and this level of alertness is making it harder for her to stop his LVAD in addition to her other fears as mentioned. We shared our concerns that if he were to develop respiratory distress that we may not have enough time to get medications on board to alleviate his symptoms. Wife verbalized understanding and she did agree to let me schedule low-dose fentanyl with the understanding that with any signs of respiratory changes we will be ordering continuous infusions of likely Dilaudid and Versed. Upon clinical death staff will disconnect the LVAD and she verbalizes understanding and in agreement with this  Recommendations/Plan:  Pain: This appears to be worsening as he nears end-of-life. We'll schedule fentanyl 25 g every 4 hours around-the-clock and continue with breakthrough medicine every 1 hours. Patient will need a drip prior to stopping LVAD when that does take place. Would recommend Dilaudid  Reaffirmed with spouse that she does want  ICD disconnected, orders placed  Emergency Versed ordered in the event of acute decline, respiratory distress  Goals of Care and Additional Recommendations:  Limitations  on Scope of Treatment: Initiate Comfort Feeding, No Artificial Feeding, No Chemotherapy, No Hemodialysis, No Radiation, No Surgical Procedures and No Tracheostomy  Code Status:    Code Status Orders        Start     Ordered   05/09/17 1531  Do not attempt resuscitation (DNR)  Continuous    Question Answer Comment  In the event of cardiac or respiratory ARREST Do not call a "code blue"   In the event of cardiac or respiratory ARREST Do not perform Intubation, CPR,  defibrillation or ACLS   In the event of cardiac or respiratory ARREST Use medication by any route, position, wound care, and other measures to relive pain and suffering. May use oxygen, suction and manual treatment of airway obstruction as needed for comfort.      05/09/17 1530    Code Status History    Date Active Date Inactive Code Status Order ID Comments User Context   04/19/2017  5:21 PM 05/09/2017  3:30 PM Partial Code [id number] Ventricular Assist Device in place.  No Chest Compressions. Annamaria Helling Inpatient   12/13/2011  4:43 PM 12/18/2011  7:39 PM Full Code [id number]  Jerald Kief, RN Inpatient    Advance Directive Documentation     Most Recent Value  Type of Advance Directive  Healthcare Power of Attorney, Living will  Pre-existing out of facility DNR order (yellow form or pink MOST form)  -  "MOST" Form in Place?  -       Prognosis:   Hours - Days. Patient has a limited prognosis whether family elects to stop LVAD. I believe that patient  has developed sepsis again, and is transitioning towards death whether spouse is able to turn LVAD off or not. The fact that he is been alert and lucid overnight has made this even more difficult for her  Discharge Planning:  Anticipated Hospital Death  Care plan was discussed with Darrick Grinder, NP Heart Failure Team  Thank you for allowing the Palliative Medicine Team to assist in the care of this patient.   Time In: 1100 Time Out: 1230 Total Time 90 min Prolonged Time Billed  yes       Greater than 50%  of this time was spent counseling and coordinating care related to the above assessment and plan.  Dory Horn, NP  Please contact Palliative Medicine Team phone at [phone] for questions and concerns.

## 2017-05-12 NOTE — Progress Notes (Signed)
   Met with Palliative Care, wife, daughter, and daughter in law. We discussed the following.   He is clearly declining with evidence of multisystem organ failure. CXR concerning for concurrent heart failure and pneumonia. I have discussed with Pharm D. No other antibiotics available with multiple allergies.   Mrs Postle was agreeable to deactivate ICD and to stop nonessential medications. Per Palliative will provide scheduled.  She understands if he develops respiratory distress we would need to start dilaudid and versed drips.   Appreciate Palliative Care input and ongoing support.   Amy Clegg NP-C  12:17 PM  Agree with above.  Glori Bickers, MD  3:39 PM

## 2017-05-12 NOTE — Progress Notes (Signed)
Spoke with Bensimhon MD and notified him of patient's PI events with PI's dropping as low as 2.7, although patient rebounds back to the 3.5-4 range. MD aware. No new orders received. Will continue to monitor patient closely.   Marlou Porch

## 2017-05-12 NOTE — Progress Notes (Signed)
Patient ID: [name], male   DOB: [birth year], 76 y.o.   MRN: [id number]   Advanced Heart Failure VAD Team Note  Subjective:    Admitted 24-Apr-2017 with black, tarry stools and Hgb 6.6.  Septic/PNA- antibiotics stopped on 5/29.   Palliative Care consulted for GOC. Now DNR, considering hospice care.   Earlier this morning developed fever 103. Given IV tylenol. WBC up to 28K. Oxygen increased to 6 liters.   Continues to moan with turns. Lethargic. Wife at the bedside.    LVAD INTERROGATION:  HeartMate II LVAD:  Flow 6.2liters/min, speed 9400, power 6.3 PI 3.6 .  8 PI events over night.    Objective:    Vital Signs:   Temp:  [98.6 F (37 C)-103 F (39.4 C)] 98.6 F (37 C) (06/10 0751) Pulse Rate:  [50-117] 97 (06/10 0800) Resp:  [12-22] 16 (06/10 0800) SpO2:  [71 %-97 %] 93 % (06/10 0800) Last BM Date: 05/07/17 Mean arterial Pressure 70-80s Intake/Output:   Intake/Output Summary (Last 24 hours) at 05/12/17 0814 Last data filed at 05/12/17 0649  Gross per 24 hour  Intake             1510 ml  Output              350 ml  Net             1160 ml      Physical Exam: CVP 7 GENERAL: Chronically ill. Moaning. Lethargic. Wife at bedside. Ice packs under arms/ groin.  HEENT: normal  NECK: Supple, JVP 7-8 .  2+ bilaterally, no bruits.  No lymphadenopathy or thyromegaly appreciated.   CARDIAC:  Mechanical heart sounds with LVAD hum present.  LUNGS:  Rhonchi throughout. On 6 liters oxygen.   ABDOMEN:  Soft, round, nontender, positive bowel sounds x4.     LVAD exit site: well-healed and incorporated.  Dressing dry and intact.  No erythema or drainage.  Stabilization device present and accurately applied.  Driveline dressing is being changed daily per sterile technique. EXTREMITIES:  Warm and dry, no cyanosis, clubbing. R and LLE 2+  Edema. R and LLE with prevalon boots on.  RUE  PICC NEUROLOGIC:  Lethargic. Does not follow commands.  Skin: Perinal skin erosion. Multiple  allevyn foam dressings in place.  GU: Foley- yellow urine.      Telemetry:  A fib 70-100s with PVCs. Personally reviewed.   Labs: Basic Metabolic Panel:  Recent Labs Lab 05/08/17 0358 05/09/17 0434 05/10/17 0454 05/11/17 0552 05/12/17 0500  NA 135 133* 133* 132* 133*  K 5.1 5.4* 4.7 4.8 5.2*  CL 101 98* 96* 96* 97*  CO2 30 29 29 30 30   GLUCOSE 84 79 93 82 99  BUN 59* 60* 62* 60* 63*  CREATININE 1.76* 1.79* 2.04* 1.72* 2.05*  CALCIUM 7.9* 8.1* 8.0* 8.1* 8.0*    Liver Function Tests: No results for input(s): AST, ALT, ALKPHOS, BILITOT, PROT, ALBUMIN in the last 168 hours. No results for input(s): LIPASE, AMYLASE in the last 168 hours.  Recent Labs Lab 05/10/17 1127  AMMONIA 12    CBC:  Recent Labs Lab 05/08/17 2106 05/09/17 0434 05/10/17 0454 05/11/17 0552 05/12/17 0500  WBC 7.1 7.6 9.6 9.2 28.4*  HGB 8.7* 8.6* 8.6* 8.3* 9.0*  HCT 29.6* 29.4* 28.6* 28.0* 30.2*  MCV 94.0 93.9 93.2 93.6 94.1  PLT 232 244 245 240 297    INR:  Recent Labs Lab 05/08/17 0358 05/09/17 0434 05/10/17 0454 05/11/17 0552 05/12/17  0500  INR 2.00 1.73 1.66 1.97 2.21    Other results:   Imaging: No results found.   Medications:     Scheduled Medications: . calcium carbonate  1 tablet Oral BID WC  . cholecalciferol  1,000 Units Oral Daily  . febuxostat  40 mg Oral Daily  . feeding supplement (ENSURE ENLIVE)  237 mL Oral BID BM  . ferrous sulfate  325 mg Oral Q breakfast  . furosemide  40 mg Oral BID  . Gerhardt's butt cream   Topical TID  . hydroxychloroquine  400 mg Oral QPC breakfast  . mirtazapine  7.5 mg Oral QHS  . mometasone-formoterol  2 puff Inhalation BID  . pantoprazole  40 mg Oral BID  . pravastatin  40 mg Oral Daily  . sodium chloride flush  10-40 mL Intracatheter Q12H  . tamsulosin  0.4 mg Oral Daily  . Warfarin - Pharmacist Dosing Inpatient   Does not apply q1800    Infusions: . ferumoxytol Stopped (05/10/17 1200)  . fluconazole (DIFLUCAN) IV  Stopped (05/11/17 1247)    PRN Medications: acetaminophen, albuterol, diphenhydrAMINE, fentaNYL (SUBLIMAZE) injection, hydrOXYzine, Melatonin, ondansetron (ZOFRAN) IV, sodium chloride flush   Assessment/Plan   1. 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 Noted to have functional SBO with 3 retained capsule cameras.  Has had multiple GI work-ups at [facility]. Would not repeat scope unless rebleeds. - Has had multiple transfusions this admission.  Continue Iron daily.  - Todays Hgb is 9.  2. Acute on chronic systolic CHF due to ICM s/p LVAD 04/2012 at [facility] for DT.  Transitioned yesterday to po Lasix with rising creatinine.  - CVP 7. Continue IV lasix. I am concerned about his possibility to swallow. 3. Chronic anticoagulation for LVAD/afib:  - Goal INR 1.7-2.3, no ASA, INR 1.9 .  Continue coumadin. 4. Chronic Afib:  On coumadin. Rate controlled. Rate controlled.     5. Shock Septic with productive cough: suspect HCAP.  CXR with L basilar airspace opacity.  PCT falling,  Off pressors. Antibiotics stopped 5/29. 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 He had 7 days of antibiotics.  Febrile this morning with T 103. Wbc significant rise to 28. CXR now. Check blood cx and urine cx.   6. AKI on CKD IV: Creatinine 2.05. Trending up.     7. 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  - Continue silicone dressing to R forearm and buttock.  - Continue Gerhardt creams to perianal skin - Continue interdry to groin. Continue IV diflucan.  - Continue low air loss overload and reposition R to L only.  - Elevated heels to prevent pressure ulcers. Add Prevalon Boot to R and LLE .  8. Depression 9. NSVT - Off amio.  10. Severe Malnutrition: Nutrition following. Prealbumin falling 6.2. Continue high  protein foods. Lethargic today. I am concerned about his airway. Will only give nutrition if he awake.  11. Urinary Incontinence: Continue foley for now until skin issues improved.  12. Candidiasis: Groin.  Continue IV diflucan per pharmacy.  13. Suspect Deep Tissue Injury --->L Dorsum of Foot nonblanching purple area. Will need to keep compression off. Elevate heels.  14. Delirium: Ongoing.  15. DNR- Palliative following. Continues to decline.  Suspect he will pass in the hospital.  I had a lengthy discussion with his wife at the bedside. She is considering transitioning to comfort care but having difficulty making that decision. He is clearly declining with evidence of multisystem organ failure. If she elects to transition to comfort care we will need to turn off ICD and LVAD.    Today I discussed with Hayden Roux NP with Palliative Care at the bedside.Plan to return at 2:00 for family meeting.    I reviewed the LVAD parameters from today, and compared the results to the patient's prior recorded data.  No programming changes were made.  The LVAD is functioning within specified parameters.  LVAD equipment check completed and is in good working order.  Back-up equipment present.   LVAD education done on emergency procedures and precautions and reviewed exit site care.  Length of Stay: 28  [name], NP 05/12/2017, 8:14 AM  VAD Team --- VAD ISSUES ONLY--- Pager [phone] (7am - 7am)  Advanced Heart Failure Team  Pager [phone] (M-F; 7a - 4p)  Please contact [facility] for night-coverage after hours (4p -7a ) and weekends on [email or web address]  Agree with above. He is declining with multi-system organ failure. In/out of consciousness. His ICD has now been deactivated and all non-essential meds stopped. His family realizes he will not survive this hospitalization. However they are not ready to switch to full comfort care with LVAD deactivation. Appreciate Palliative Care input. If he becomes  uncomfortable or develops respiratory distress will start comfort drips. LVAD parameters otherwise stable.   Arvilla Meres, MD  3:39 PM

## 2017-05-13 DIAGNOSIS — Z515 Encounter for palliative care: Secondary | ICD-10-CM

## 2017-05-13 LAB — BASIC METABOLIC PANEL
ANION GAP: 5 (ref 5–15)
BUN: 64 mg/dL — ABNORMAL HIGH (ref 6–20)
CO2: 32 mmol/L (ref 22–32)
Calcium: 7.8 mg/dL — ABNORMAL LOW (ref 8.9–10.3)
Chloride: 98 mmol/L — ABNORMAL LOW (ref 101–111)
Creatinine, Ser: 1.93 mg/dL — ABNORMAL HIGH (ref 0.61–1.24)
GFR calc non Af Amer: 32 mL/min — ABNORMAL LOW (ref >60)
GFR, EST AFRICAN AMERICAN: 37 mL/min — AB (ref >60)
Glucose, Bld: 90 mg/dL (ref 65–99)
POTASSIUM: 4.3 mmol/L (ref 3.5–5.1)
SODIUM: 135 mmol/L (ref 135–145)

## 2017-05-13 LAB — BLOOD CULTURE ID PANEL (REFLEXED)
ACINETOBACTER BAUMANNII: NOT DETECTED
CANDIDA ALBICANS: NOT DETECTED
CANDIDA GLABRATA: NOT DETECTED
CANDIDA PARAPSILOSIS: NOT DETECTED
CANDIDA TROPICALIS: NOT DETECTED
Candida krusei: NOT DETECTED
ENTEROBACTER CLOACAE COMPLEX: NOT DETECTED
ENTEROBACTERIACEAE SPECIES: NOT DETECTED
Enterococcus species: NOT DETECTED
Escherichia coli: NOT DETECTED
HAEMOPHILUS INFLUENZAE: NOT DETECTED
KLEBSIELLA OXYTOCA: NOT DETECTED
Klebsiella pneumoniae: NOT DETECTED
Listeria monocytogenes: NOT DETECTED
METHICILLIN RESISTANCE: DETECTED — AB
NEISSERIA MENINGITIDIS: NOT DETECTED
PSEUDOMONAS AERUGINOSA: NOT DETECTED
Proteus species: NOT DETECTED
STREPTOCOCCUS PNEUMONIAE: NOT DETECTED
STREPTOCOCCUS PYOGENES: NOT DETECTED
STREPTOCOCCUS SPECIES: NOT DETECTED
Serratia marcescens: NOT DETECTED
Staphylococcus aureus (BCID): NOT DETECTED
Staphylococcus species: DETECTED — AB
Streptococcus agalactiae: NOT DETECTED

## 2017-05-13 LAB — URINE CULTURE: CULTURE: NO GROWTH

## 2017-05-13 LAB — CBC
HCT: 26.7 % — ABNORMAL LOW (ref 39.0–52.0)
Hemoglobin: 8.1 g/dL — ABNORMAL LOW (ref 13.0–17.0)
MCH: 27.9 pg (ref 26.0–34.0)
MCHC: 30.3 g/dL (ref 30.0–36.0)
MCV: 92.1 fL (ref 78.0–100.0)
Platelets: 215 10*3/uL (ref 150–400)
RBC: 2.9 MIL/uL — AB (ref 4.22–5.81)
RDW: 19.1 % — AB (ref 11.5–15.5)
WBC: 14.8 10*3/uL — AB (ref 4.0–10.5)

## 2017-05-13 LAB — LACTATE DEHYDROGENASE: LDH: 135 U/L (ref 98–192)

## 2017-05-13 LAB — PROTIME-INR
INR: 2.55
Prothrombin Time: 27.9 seconds — ABNORMAL HIGH (ref 11.4–15.2)

## 2017-05-13 LAB — PROCALCITONIN: PROCALCITONIN: 4.56 ng/mL

## 2017-05-13 MED ORDER — HALOPERIDOL LACTATE 2 MG/ML PO CONC
0.5000 mg | ORAL | Status: DC | PRN
  Filled 2017-05-13: qty 0.3

## 2017-05-13 MED ORDER — GLYCOPYRROLATE 0.2 MG/ML IJ SOLN
0.2000 mg | INTRAMUSCULAR | Status: DC | PRN
  Administered 2017-05-13: 0.2 mg via INTRAVENOUS
  Filled 2017-05-13: qty 1

## 2017-05-13 MED ORDER — MIDAZOLAM BOLUS VIA INFUSION
2.0000 mg | INTRAVENOUS | Status: DC | PRN
  Administered 2017-05-13: 2 mg via INTRAVENOUS
  Filled 2017-05-13: qty 2

## 2017-05-13 MED ORDER — HALOPERIDOL 0.5 MG PO TABS: 0.5000 mg | ORAL_TABLET | ORAL | Status: DC | PRN

## 2017-05-13 MED ORDER — FENTANYL 2500MCG IN NS 250ML (10MCG/ML) PREMIX INFUSION
10.0000 ug/h | INTRAVENOUS | Status: DC
  Administered 2017-05-13: 10 ug/h via INTRAVENOUS
  Filled 2017-05-13: qty 250

## 2017-05-13 MED ORDER — FENTANYL BOLUS VIA INFUSION
20.0000 ug | INTRAVENOUS | Status: DC | PRN
  Filled 2017-05-13: qty 20

## 2017-05-13 MED ORDER — GLYCOPYRROLATE 0.2 MG/ML IJ SOLN: 0.2000 mg | INTRAMUSCULAR | Status: DC | PRN

## 2017-05-13 MED ORDER — FENTANYL BOLUS VIA INFUSION
25.0000 ug | INTRAVENOUS | Status: DC | PRN
  Administered 2017-05-13: 25 ug via INTRAVENOUS
  Filled 2017-05-13: qty 25

## 2017-05-13 MED ORDER — MIDAZOLAM HCL 5 MG/ML IJ SOLN
0.5000 mg/h | INTRAMUSCULAR | Status: DC
  Administered 2017-05-13: 0.5 mg/h via INTRAVENOUS
  Filled 2017-05-13: qty 10

## 2017-05-13 MED ORDER — GLYCOPYRROLATE 1 MG PO TABS
1.0000 mg | ORAL_TABLET | ORAL | Status: DC | PRN
  Filled 2017-05-13: qty 1

## 2017-05-13 MED ORDER — ATROPINE SULFATE 1 % OP SOLN
2.0000 [drp] | Freq: Four times a day (QID) | OPHTHALMIC | Status: DC | PRN
  Filled 2017-05-13: qty 2

## 2017-05-13 MED ORDER — HALOPERIDOL LACTATE 5 MG/ML IJ SOLN: 0.5000 mg | INTRAMUSCULAR | Status: DC | PRN

## 2017-05-15 ENCOUNTER — Encounter (HOSPITAL_COMMUNITY): Payer: Self-pay | Admitting: *Deleted

## 2017-05-15 LAB — CULTURE, BLOOD (ROUTINE X 2): SPECIAL REQUESTS: ADEQUATE

## 2017-05-15 NOTE — Progress Notes (Signed)
DC completed and signed by Dr Gala Romney, returned to funeral home

## 2017-05-16 ENCOUNTER — Telehealth: Payer: Self-pay | Admitting: Licensed Clinical Social Worker

## 2017-05-16 NOTE — Telephone Encounter (Signed)
CSW contacted patient's family to offer support and condolences. CSW spoke with family member who stated wife was unable to come to the phone but would pass on number for her to return call if needed. CSW available as needed. Lasandra Beech, LCSW, CCSW-MCS [phone]

## 2017-05-17 LAB — CULTURE, BLOOD (ROUTINE X 2)
Culture: NO GROWTH
Special Requests: ADEQUATE

## 2017-06-02 NOTE — Progress Notes (Signed)
Daily Progress Note   Patient Name: [name]       Date: 05/24/2017 DOB: Dec 26, 1940  Age: 76 y.o. MRN#: [id number] Attending Physician: Dolores Patty, MD Primary Care Physician: Ignatius Specking, MD Admit Date: 05-20-2017  Reason for Consultation/Follow-up: Non pain symptom management, Pain control, Terminal Care and Withdrawal of life-sustaining treatment  Subjective: Patient in bed. Awake, tells me he is in pain, his legs hurt and his buttocks is raw. He says he wants to "go to sleep" and "IF I wake up I'll be better".  Family states he has said he is ready to stop the LVAD and has asked, "why am I awake for this?". Heart Failure NP notes patient verbalized he was ready to d/c LVAD. Patient's wife, daughter, daughter in law, son, and grandson are at bedside. All agree that patient is suffering and they desire to discontinue artificial life supporting measures including LVAD. Their GOC at this point are comfort and support patient through dying process.    ROS  Length of Stay: 20  Current Medications: Scheduled Meds:  . Gerhardt's butt cream   Topical TID    Continuous Infusions: . fentaNYL infusion INTRAVENOUS    . midazolam (VERSED) infusion      PRN Meds: fentaNYL, glycopyrrolate **OR** glycopyrrolate **OR** glycopyrrolate, haloperidol **OR** haloperidol **OR** haloperidol lactate, midazolam  Physical Exam          Vital Signs: BP (!) 79/61 (BP Location: Left Arm)   Pulse 97   Temp 98.6 F (37 C) (Oral)   Resp 12   Ht 5\' 7"  (1.702 m)   Wt 99.6 kg (219 lb 9.6 oz)   SpO2 90%   BMI 34.39 kg/m  SpO2: SpO2: 90 % O2 Device: O2 Device: Nasal Cannula O2 Flow Rate: O2 Flow Rate (L/min): 6 L/min  Intake/output summary:  Intake/Output Summary (Last 24 hours) at  05/21/2017 1616 Last data filed at 05/26/2017 1100  Gross per 24 hour  Intake              200 ml  Output             2125 ml  Net            -1925 ml   LBM: Last BM Date: 05/05/2017 Baseline Weight: Weight: 88.9 kg (196 lb) Most  recent weight: Weight: 99.6 kg (219 lb 9.6 oz)       Palliative Assessment/Data:  PPS: 10%   Flowsheet Rows     Most Recent Value  Intake Tab  Referral Department  Cardiology  Unit at Time of Referral  ICU  Palliative Care Primary Diagnosis  Cardiac  Date Notified  05/09/17  Palliative Care Type  New Palliative care  Reason for referral  Clarify Goals of Care  Date of Admission  04/29/2017  Date first seen by Palliative Care  05/09/17  # of days Palliative referral response time  0 Day(s)  # of days IP prior to Palliative referral  16  Clinical Assessment  Psychosocial & Spiritual Assessment  Palliative Care Outcomes      Patient Active Problem List   Diagnosis Date Noted  . 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 Protein-calorie malnutrition, severe (HCC) 05/09/2017  . Acute on chronic systolic (congestive) heart failure (HCC)   . Palliative care by specialist   . Goals of care, counseling/discussion   . Advance care planning   . Healthcare-associated pneumonia   . Chronic kidney disease (CKD), stage IV (severe) (HCC)   . Tachycardia   . Tachypnea   . Symptomatic anemia 04/19/2017  . Iron deficiency anemia due to chronic blood loss 02/11/2017  . Malabsorption of iron 02/11/2017  . Anemia of chronic renal failure, stage 4 (severe) (HCC) 02/11/2017  . Mass of pancreas 09/01/2015  . Falls 08/17/2015  . LVAD (left ventricular assist device) present (HCC) 09/15/2014  . Fatigue 03/18/2014  . GI bleed 07/14/2013  . Implantable cardiac defibrillator lead failure 11/20/2012  . ICD-Medtronic 11/13/2012  . Gout 11/12/2012  . Acquired von Willebrand disease (HCC) 05/27/2012  . Mitral regurgitation 11/20/2011  . Renal insufficiency 11/20/2011  .  Edema 08/28/2011  . Peripheral neuropathy 08/28/2011  . Long term current use of anticoagulant therapy 02/23/2011  . Dyspnea and respiratory abnormalities 12/21/2010  . POSTSURG PERCUT TRANSLUMINAL COR ANGPLSTY STS 12/21/2010  . CHRONIC SYSTOLIC HEART FAILURE 11/24/2010  . ATRIAL FIBRILLATION 10/13/2010  . DM 10/11/2009  . OBSTRUCTIVE SLEEP APNEA 10/11/2009  . CORONARY ARTERY DISEASE, S/P PTCA 10/11/2009  . DVT 10/11/2009  . PULMONARY NODULE 10/11/2009  . MESENTERIC VENOUS THROMBOSIS 10/11/2009  . OTH SPEC D/O RESULT FROM IMPAIRED RENAL FUNCTION 10/11/2009  . PULMONARY EMBOLISM, HX OF 10/11/2009  . HYPERLIPIDEMIA-MIXED 08/16/2009  . CAD, NATIVE VESSEL 08/16/2009  . Hypotension 08/16/2009    Palliative Care Assessment & Plan   Patient Profile:  76 y.o. 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 Workup revealed Hgb at 6.6, sepsis and pneumonia. He has received multiple blood transfusions during admission and current Hgb is at 8.6. 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 Functional recovery has been minimal. He does not qualify for CIR- not able to withstand intense rehab. Palliative medicine consulted for GOC. Patient and family are now ready for transition to full comfort care. Anticipate hospital death.  Assessment/Recommendations/Plan   DNR  Start versed and fentanyl continuous IV infusions for pain and symptom control  DC all interventions not provided for patient comfort  Expect  that once LVAD is stopped patient will become quite symptomatic with CHF so will  be aggressive with palliative sedation parameters- discussed with patient's RN  Goals of Care and Additional Recommendations:  Limitations on Scope of Treatment: Full Comfort Care  Code Status:  DNR  Prognosis:   Hours - Days  Discharge Planning:  Anticipated Hospital Death  Care plan was discussed with patient, patient's family, patient's RN.  Thank you for allowing the Palliative Medicine Team to assist in the care of this patient.   Time In: 1500 Time Out: 1600 Total Time 60 minutes Prolonged Time Billed NO      Greater than 50%  of this time was spent counseling and coordinating care related to the above assessment and plan.  Ocie Bob, AGNP-C Palliative Medicine   Please contact Palliative Medicine Team phone at [phone] for questions and concerns.

## 2017-06-02 NOTE — Discharge Summary (Signed)
Advanced Heart Failure Team  Discharge/Death  Summary   Patient ID: [name] MRN: [id number], DOB/AGE: Nov 10, 1941 76 y.o. Admit date: 04/11/2017 D/C date:    2017/05/31   Primary Discharge Diagnoses:  1. Health care associated pneumonia 2. Acute on chronic systolic heart failure s/p LVAD 3. Stevens-Johnson syndrome 4. Acute on chronic renal failure stage III 5. Severe protein calorie malnutrition 6. Chronic AF 7. Failure to thrive 8. Acute blood loss anemia likely due to recurrent GI bleed    Hospital Course:   [name] was a 76 y.o. male with multiple problems as above. He underwent LVAD placement in 5/13 at [facility] for end-stage HF.   Earlier this year,n admitted to [facility] for ankle fracture and had a prolonged hospital stay (4 months) due to failure to thrive and multiple comorbidities. His functional status has declined steadily over the course of the year.  In early May seen in our HF/VAD Clinic for weakness and probable bronchitis. Started on po antiobiotics.  On 04/03/2017 admitted for profound fatigue and tarry stools. Hgb found to be 6.6. Also noted to have productive cough with LUL infiltrate on CXR concerning for HCPA. He was transfused but felt to be too infirm for repeat endoscopy. Melena subsequently resolved. Treated with broad spectrum IV abx. PNA resolved but unfortunately developed multiple skin wounds concerning for [employer] syndrome. He was treated with aggressive wound care and pain control and lesions improved somewhat though wound healing limited by poor nutritional status.   Unfortunately patient's clinical course steadily deteriorated over the course of his hospital stay with volume overload, worsening anemia requiring multiple blood transfusions, severe protein calorie malnutrition and profound debility. Numerous efforts were made to improve his nutritional status and strength however these were unsuccessful.   The Palliative Care team was  consulted. After multiple conversations with him and his family, [name] decided that his quality of life was poor and he opted to switch to comfort care. On 05/31/2017 his LVAD was turned off and he passed minutes later with his family at his bedside.    Signed, Arvilla Meres MD 05/29/2017, 6:56 PM

## 2017-06-02 NOTE — Progress Notes (Signed)
LVAD Coordinator inpatient rounding note:  Admitted 5/22/18due to suspected GIB due to black, tarry stools.   Septic/PNA- antibiotics stopped on 5/29.   HeartMate II LVAD implanted on 5/2013by [facility].   Vital signs: HR: 92 Doppler Pressure: 80 Automatic BP: Not able to perform O2 Sat: 91 on 6 LNC Wt in lbs:  196.....205>207>208>215>209>213>214>[id number]  LVAD interrogation reveals:  Speed: 9400 Flow: 5.5 Power: 6.1 PI: 4.9 Alarms: none Events: >200 over last 24 hrs Fixed speed: 9400 Low speed limit: 8800  Drive Line: Weekly. Performed by wife; next dressing change due 05/16/17  Gtts:  Amiodarone 30 mg/hr - started 04/30/17, stopped 6/2   Labs:  LDH trend:143>137>200>131>139>129>129>125>128>137>137>126>154>144>151>153>135  INR trend: 4.12>3.94>3.99>1.77>1.79>2.19>2.27>2.15>2.37>2.60>2.33>2.00>1.73>1.66>1.97>2.21>2.55  Anticoagulation Plan: -INR Goal: 2-3 -ASA Dose: none due to GIB history  Adverse Events on VAD: -07/2013>GIB  Plan/Recommendations:   1. Weekly dressing changes. Next due 6/14. 2. Palliative care consult completed. 3. Pt is now DNR 4. Continue supportive care  Hessie Diener RN, VAD Coordinator 24/7 pager [phone]

## 2017-06-02 NOTE — Progress Notes (Signed)
Physical Therapy Discharge Patient Details Name: [name] MRN: [id number] DOB: [birth year] Today's Date: May 24, 2017 Time:  -     Patient discharged from PT services secondary to pt for comfort care.  [facility] PT [phone]      [name] [facility] 05/24/17, 7:59 AM

## 2017-06-02 NOTE — Progress Notes (Signed)
Per palliative note patient transitioning to comfort measures and no longer interested in SNF placement  CSW signing off- please reconsult if needed  Burna Sis, LCSW Clinical Social Worker [phone]

## 2017-06-02 NOTE — Progress Notes (Signed)
OT Sign off Note  Patient Details Name: [name] MRN: [id number] DOB: [birth year]   Cancelled Treatment:    Reason Eval/Treat Not Completed: OT screened, no needs identified, will sign off Pt currently comfort care only. Spoke with PT [employer] OT to sign off acutely  Harolyn Rutherford  [phone] 05/14/2017, 8:01 AM

## 2017-06-02 NOTE — Progress Notes (Addendum)
Witnessed waste of Versed and Fentanyl with Feliberto Gottron RN

## 2017-06-02 NOTE — Progress Notes (Signed)
16 mL of Versed and 200 mL of Fentanyl wasted in sink.  Witnessed by Holland Falling RN.    Marlou Porch

## 2017-06-02 NOTE — Progress Notes (Signed)
Chaplain was paged to support Pt and family during withdrawal. Chaplain spoke with grandson outside of the room. Upon enter Chaplain notice family . Son was quietly emotional and withdrawn. Wife and daughter and daughter-in law was present. They were accepting of the soon transition of the pt. Chaplain asked if they wanted prayer and they agreed. Chaplain prayed a prayer for peace and comfort. The family whispered to the Pt and said thing that brought laughter. Chaplain helped draw grandson closer to the bedside. Chaplain then stood quietly at tthe foot of the bed to just be present. Chaplain asked if it was anything the family needed. He then asked if they wanted to be alone with the Pgt and they agreed.    05-15-17 1800  Clinical Encounter Type  Visited With Patient and family together  Visit Type Spiritual support;Patient actively dying  Referral From Nurse  Spiritual Encounters  Spiritual Needs Prayer;Grief support  Stress Factors  Patient Stress Factors Major life changes  Family Stress Factors Major life changes

## 2017-06-02 NOTE — Progress Notes (Signed)
Pt time of death 1830. Family at bedside. RN's Joni Reining and Crest listened for 2 minutes. NP [name] and MD Bensihmon notified. Strip printed in pts chart. CDS notified. Support given to family.

## 2017-06-02 NOTE — Progress Notes (Signed)
Cards MD paged regarding blood-streaked stool last night and patient's morning Hgb of 8.1. No new orders received at this time. Will continue to monitor.   Marlou Porch

## 2017-06-02 NOTE — Progress Notes (Signed)
PHARMACY - PHYSICIAN COMMUNICATION CRITICAL VALUE ALERT - BLOOD CULTURE IDENTIFICATION (BCID)  Results for orders placed or performed during the hospital encounter of 04/08/2017  Blood Culture ID Panel (Reflexed) (Collected: 05/12/2017  9:08 AM)  Result Value Ref Range   Enterococcus species NOT DETECTED NOT DETECTED   Listeria monocytogenes NOT DETECTED NOT DETECTED   Staphylococcus species DETECTED (A) NOT DETECTED   Staphylococcus aureus NOT DETECTED NOT DETECTED   Methicillin resistance DETECTED (A) NOT DETECTED   Streptococcus species NOT DETECTED NOT DETECTED   Streptococcus agalactiae NOT DETECTED NOT DETECTED   Streptococcus pneumoniae NOT DETECTED NOT DETECTED   Streptococcus pyogenes NOT DETECTED NOT DETECTED   Acinetobacter baumannii NOT DETECTED NOT DETECTED   Enterobacteriaceae species NOT DETECTED NOT DETECTED   Enterobacter cloacae complex NOT DETECTED NOT DETECTED   Escherichia coli NOT DETECTED NOT DETECTED   Klebsiella oxytoca NOT DETECTED NOT DETECTED   Klebsiella pneumoniae NOT DETECTED NOT DETECTED   Proteus species NOT DETECTED NOT DETECTED   Serratia marcescens NOT DETECTED NOT DETECTED   Haemophilus influenzae NOT DETECTED NOT DETECTED   Neisseria meningitidis NOT DETECTED NOT DETECTED   Pseudomonas aeruginosa NOT DETECTED NOT DETECTED   Candida albicans NOT DETECTED NOT DETECTED   Candida glabrata NOT DETECTED NOT DETECTED   Candida krusei NOT DETECTED NOT DETECTED   Candida parapsilosis NOT DETECTED NOT DETECTED   Candida tropicalis NOT DETECTED NOT DETECTED    Name of physician (or Provider) Contacted: Bensimhon, MD  Changes to prescribed antibiotics required: None, likely contaminant  Tad Moore, BCPS  Clinical Pharmacist Pager [phone]  05/15/2017 1:58 PM

## 2017-06-02 NOTE — Progress Notes (Signed)
ANTICOAGULATION CONSULT NOTE - Follow Up Consult  Pharmacy Consult for Warfarin Indication: LVAD  Patient Measurements: Height: 5\' 7"  (170.2 cm) Weight: 219 lb 9.6 oz (99.6 kg) IBW/kg (Calculated) : 66.1  Vital Signs: Temp: 98.2 F (36.8 C) (06/11 1100) Temp Source: Oral (06/11 1100) Pulse Rate: 99 (06/11 1200)  Labs:  Recent Labs  05/11/17 0552 05/12/17 0500 12-Jun-2017 0345  HGB 8.3* 9.0* 8.1*  HCT 28.0* 30.2* 26.7*  PLT 240 297 215  LABPROT 22.7* 24.9* 27.9*  INR 1.97 2.21 2.55  CREATININE 1.72* 2.05* 1.93*    Estimated Creatinine Clearance: 35.5 mL/min (A) (by C-G formula based on SCr of 1.93 mg/dL (H)).  Assessment: 76 yo male with LVAD on chronic warfarin.  Admitted with supratherapeutic INR of 4.12 and GIB. Warfarin was held 5/22-5/27 and restarted 5/28.   INR trending up today to 2.55 (above goal range). He is also on fluconazole for a fungal superinfection of his groin. Fluconazole can elevate INR so will need to watch closely. Continues on PO amiodarone.   Hgb down today, some blood in stool overnight. LDH stable.   PTA Warfarin dose 2 mg daily.  Per patient report was recently decreased from 2 mg daily except 3 mg on MWF. No aspirin.  Goal of Therapy:  INR 1.7-2.3 (per [facility]) Monitor platelets by anticoagulation protocol: Yes   Plan:  1) Hold warfarin tonight 2) Daily INR, CBC, LDH 3) Will follow for decisions re: palliative care.  Tad Moore, BCPS  Clinical Pharmacist Pager [phone]  2017-06-12 12:43 PM

## 2017-06-02 NOTE — Progress Notes (Signed)
Patient ID: [name], male   DOB: Apr 26, 1941, 76 y.o.   MRN: [id number]   Advanced Heart Failure VAD Team Note  Subjective:    Admitted 2017-05-07 with black, tarry stools and Hgb 6.6.  Septic/PNA- antibiotics stopped on 5/29.   Palliative Care consulted for GOC. Now DNR, considering hospice care.  On June 10th developed fever 103. Given IV tylenol. WBC up to 28K. Oxygen increased to 6 liters. CXR showed evidence of recurrent pneumonia and hf. Pro calcitonin 3.78. Antibiotics not started due to multiple allergies and overall decline. Lengthy discussions with Palliative Care and his family. At the conclusion ICD was turned off and non essential medications stopped.   Over night hgb dropped to 8.1 and blood noted in his stool.   Today he is awake and oriented. He verbalized that he was ready to turn LVAD pump off.   LVAD INTERROGATION:  HeartMate II LVAD:  Flow 5.8 liters/min, speed 9400, power 6 PI 4.3     Objective:    Vital Signs:   Temp:  [97.5 F (36.4 C)-98.2 F (36.8 C)] 97.9 F (36.6 C) (06/11 0700) Pulse Rate:  [75-93] 81 (06/11 0800) Resp:  [7-19] 19 (06/11 0800) SpO2:  [90 %-100 %] 99 % (06/11 0800) Weight:  [219 lb 9.6 oz (99.6 kg)] 219 lb 9.6 oz (99.6 kg) (06/11 0404) Last BM Date: 05/12/2017 Mean arterial Pressure Map 80s  Intake/Output:   Intake/Output Summary (Last 24 hours) at 05/24/2017 0913 Last data filed at 05/29/2017 0802  Gross per 24 hour  Intake              200 ml  Output             1950 ml  Net            -1750 ml     Physical Exam: CVP 8  GENERAL: chronically ill appearing. In bed. Wife at bed side.  HEENT: normal  NECK: Supple, JVP 7-8 Carotids  2+ bilaterally, no bruits.  No lymphadenopathy or thyromegaly appreciated.   CARDIAC:  Mechanical heart sounds with LVAD hum present.  LUNGS:  Rhonchi throughout 6 liters [place] oxygen.   ABDOMEN:  Soft, round, nontender, positive bowel sounds x4.     LVAD exit site: well-healed and incorporated.   Dressing dry and intact.  No erythema or drainage.  Stabilization device present and accurately applied.   EXTREMITIES:  Warm and dry, no cyanosis, clubbing, rash or R and LLE 3+ edema . Prevalon Boots on bilaterally.  NEUROLOGIC:  Alert and oriented x 4.  Gait steady.  No aphasia.  No dysarthria.  Affect flat  GU: Foley yellow urine Skin: Dry scales, groin penis with partial thickness skin loss. Allevyn foam noted on extremities. .      Telemetry:  Afib 80-100s personally reviewed.  Labs: Basic Metabolic Panel:  Recent Labs Lab 05/09/17 0434 05/10/17 0454 05/11/17 0552 05/12/17 0500 06/01/2017 0345  NA 133* 133* 132* 133* 135  K 5.4* 4.7 4.8 5.2* 4.3  CL 98* 96* 96* 97* 98*  CO2 29 29 30 30  32  GLUCOSE 79 93 82 99 90  BUN 60* 62* 60* 63* 64*  CREATININE 1.79* 2.04* 1.72* 2.05* 1.93*  CALCIUM 8.1* 8.0* 8.1* 8.0* 7.8*    Liver Function Tests: No results for input(s): AST, ALT, ALKPHOS, BILITOT, PROT, ALBUMIN in the last 168 hours. No results for input(s): LIPASE, AMYLASE in the last 168 hours.  Recent Labs Lab 05/10/17 1127  AMMONIA 12  CBC:  Recent Labs Lab 05/09/17 0434 05/10/17 0454 05/11/17 0552 05/12/17 0500 2017/06/01 0345  WBC 7.6 9.6 9.2 28.4* 14.8*  HGB 8.6* 8.6* 8.3* 9.0* 8.1*  HCT 29.4* 28.6* 28.0* 30.2* 26.7*  MCV 93.9 93.2 93.6 94.1 92.1  PLT 244 245 240 297 215    INR:  Recent Labs Lab 05/09/17 0434 05/10/17 0454 05/11/17 0552 05/12/17 0500 01-Jun-2017 0345  INR 1.73 1.66 1.97 2.21 2.55    Other results:   Imaging: Dg Chest Port 1 View  Result Date: 05/12/2017 CLINICAL DATA:  Respiratory failure EXAM: PORTABLE CHEST 1 VIEW COMPARISON:  Apr 25, 2017 FINDINGS: There is interstitial and alveolar opacity throughout much of the right lung. There is persistent airspace consolidation in the left lower lobe region with small pleural effusions bilaterally. There is cardiomegaly with mild pulmonary venous hypertension. Pacemaker leads are attached  to the right atrium and right ventricle. There is a left ventricular assist device. Central catheter tip is in the superior vena cava. No evident adenopathy. There is aortic atherosclerosis. IMPRESSION: There is interstitial and alveolar opacity throughout much the right lung as well as the left lower lobe. Small pleural effusions bilaterally. Stable cardiac prominence. Suspect congestive heart failure, although there may well be superimposed pneumonia in the areas of alveolar opacity on each side. Both pneumonia and congestive heart failure may present concurrently. The cardiac silhouette is stable compared to the previous study. There is aortic atherosclerosis. Electronically Signed   By: Bretta Bang III M.D.   On: 05/12/2017 09:27     Medications:     Scheduled Medications: . feeding supplement (ENSURE ENLIVE)  237 mL Oral BID BM  . fentaNYL (SUBLIMAZE) injection  25 mcg Intravenous Q4H  . furosemide  80 mg Intravenous BID  . Gerhardt's butt cream   Topical TID  . mirtazapine  7.5 mg Oral QHS  . mometasone-formoterol  2 puff Inhalation BID  . sodium chloride flush  10-40 mL Intracatheter Q12H  . Warfarin - Pharmacist Dosing Inpatient   Does not apply q1800    Infusions: . fluconazole (DIFLUCAN) IV 400 mg (Jun 01, 2017 0802)    PRN Medications: acetaminophen, albuterol, atropine, diphenhydrAMINE, fentaNYL (SUBLIMAZE) injection, hydrOXYzine, Melatonin, midazolam, ondansetron (ZOFRAN) IV, sodium chloride flush   Assessment/Plan   1. 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 Noted to have functional SBO with 3 retained capsule cameras.  Has had multiple GI work-ups at [facility]. Would not repeat scope unless rebleeds. - Has had multiple transfusions this admission.  Continue Iron daily.  -Todays hgb down to 8.1 from 9.    2. 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   Diuresed with IV lasix. Creatinine ok.  Continue IV lasix for comfort.  -3. Chronic anticoagulation for LVAD/afib:  - Goal INR 1.7-2.3, no ASA, INR 2.5. Continue coumadin for now per Pharm D.  4. Chronic Afib:  On coumadin. Rate controlled. Rate controlled.     5. Shock Septic with productive cough: suspect HCAP.  CXR with L basilar airspace opacity.  PCT falling,  Off pressors. Antibiotics stopped 5/29. 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 He had 7 days of antibiotics.  On 6/10 Temp 103 with WBC up to 28. CXR was worse. Blood/Urine Cx obtain.  Afebrile. Discussed with pharmacy d. No  antibiotics option given multiple allergies. Today WBC 26.7. Procalcitonin up to 4.56Wbc significant    6. AKI on CKD IV: Creatinine 1.93.      7. 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  - Continue silicone dressing to R forearm and buttock.  - Continue Gerhardt creams to perianal skin - Continue interdry to groin. Continue IV diflucan.  - Continue low air loss overload and reposition R to L only.  - Elevated heels to prevent pressure ulcers. Add Prevalon Boot to R and LLE .  -Continue topical wound care as above. 8. Depression 9. NSVT - Off amio.  10. Severe Malnutrition: Nutrition following. Prealbumin falling 6.2. Continue high protein foods.  Liberalize diet.  11. Urinary Incontinence: Continue foley for now until skin issues improved.  12. Candidiasis: Groin.  For now continue IV diflucan.   13. Suspect Deep Tissue Injury --->L Dorsum of Foot nonblanching purple area. Will need to keep compression off. Continue prevalon boots.  14. Delirium: Coherent today.   15. DNR- Palliative following. Continues to decline.  Suspect he will pass in the hospital. Today he is alert and says he is ready to go didn't want to suffer anymore. . Anticipate transition to full comfort care later  today. Plan to follow up once his son and daughter are present.   I reviewed the LVAD parameters from today, and compared the results to the patient's prior recorded data.  No programming changes were made.  The LVAD is functioning within specified parameters.  LVAD equipment check completed and is in good working order.  Back-up equipment present.   LVAD education done on emergency procedures and precautions and reviewed exit site care.  Length of Stay: 20  [name], NP 05/18/2017, 9:13 AM  VAD Team --- VAD ISSUES ONLY--- Pager [phone] (7am - 7am)  Advanced Heart Failure Team  Pager [phone] (M-F; 7a - 4p)  Please contact [facility] for night-coverage after hours (4p -7a ) and weekends on [email or web address]  Patient seen and examined with [name], NP. We discussed all aspects of the encounter. I agree with the assessment and plan as stated above.   He continues to deteriorate with MSOF. He clearly states that he is tried and done fighting. He would like his VAD turned off.   Family at bedside this evening. His wife is having a hard time with his decision but they agree to follow his wishes.   VAD deactivated tonight at bedside by VAD team. He is now on versed and fentanyl. Suspect he will pass soon.   Arvilla Meres, MD  6:13 PM

## 2017-06-02 DEATH — deceased

## 2017-06-08 ENCOUNTER — Other Ambulatory Visit: Payer: Self-pay | Admitting: Nurse Practitioner

## 2017-07-05 ENCOUNTER — Encounter: Payer: Medicare Other | Admitting: Internal Medicine

## 2018-11-30 IMAGING — DX DG CHEST 2V
2 series · 2 of 2 positions shown · non-contrast
Comparison: Or chest x-ray January 18, 2017

CLINICAL DATA: Several day history of weakness and shortness of
breath

EXAM:
CHEST  2 VIEW

[x chest ap]
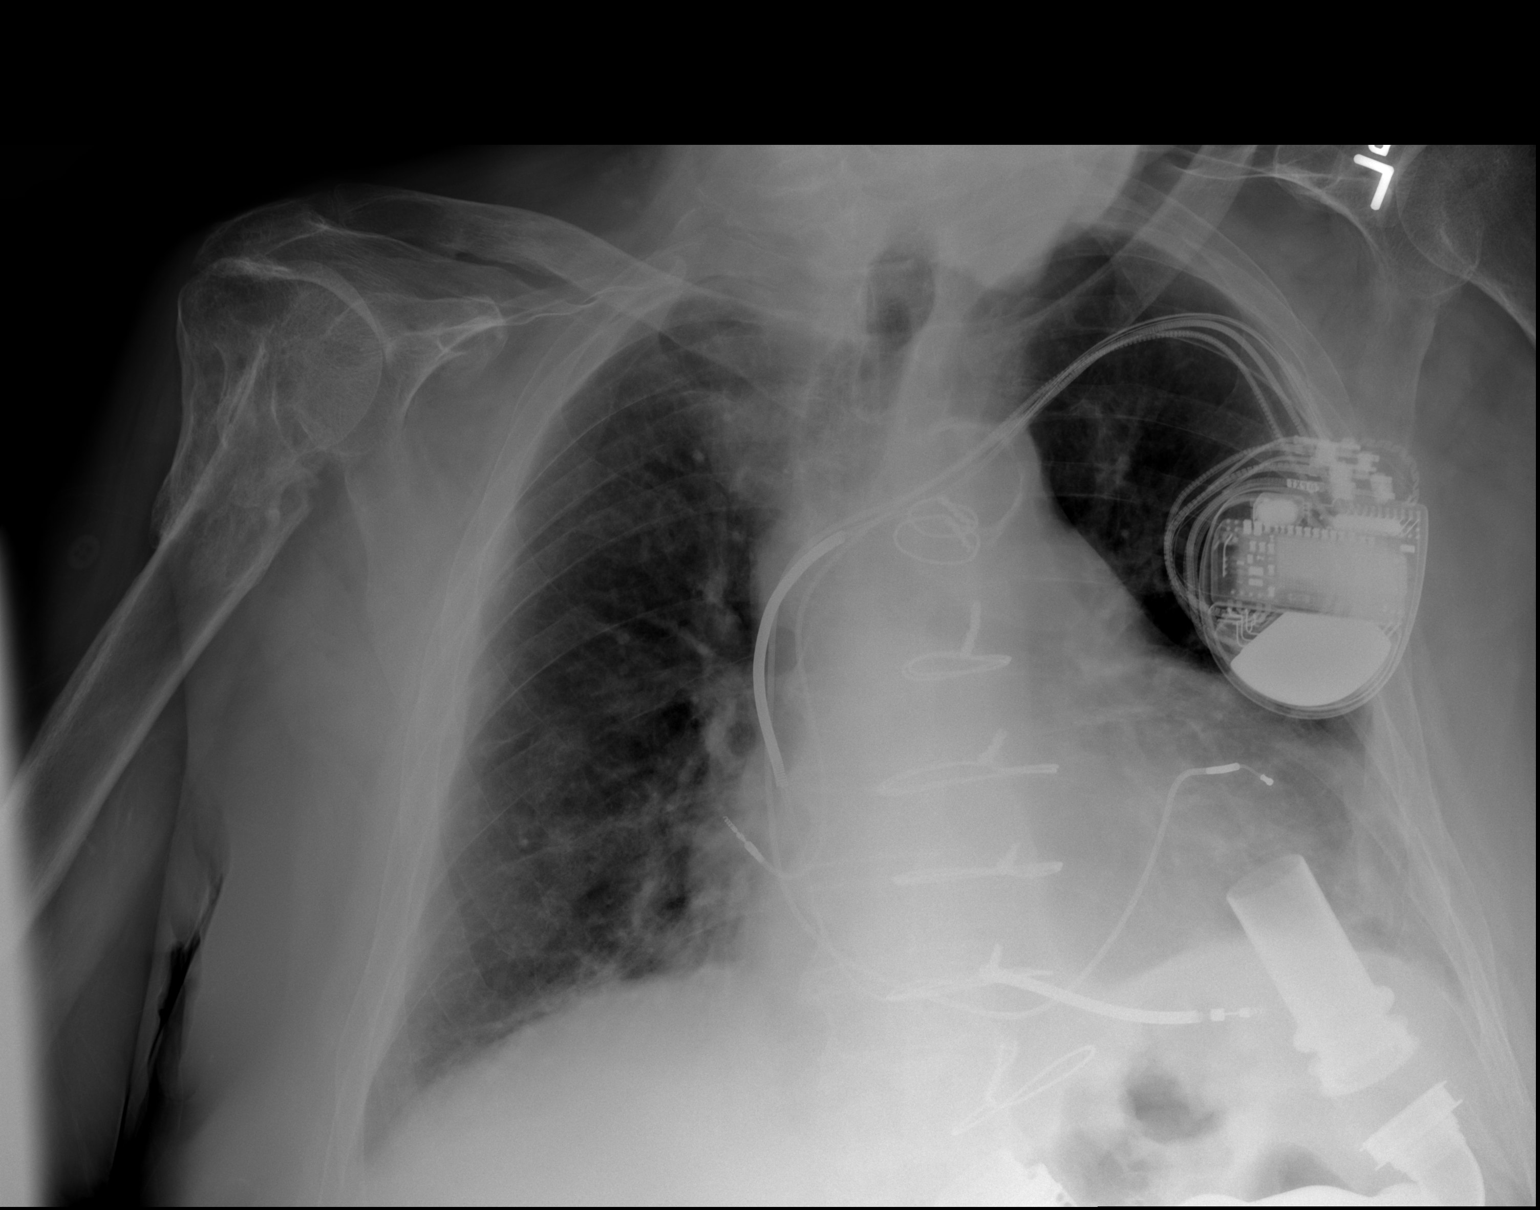

[w chest lat]
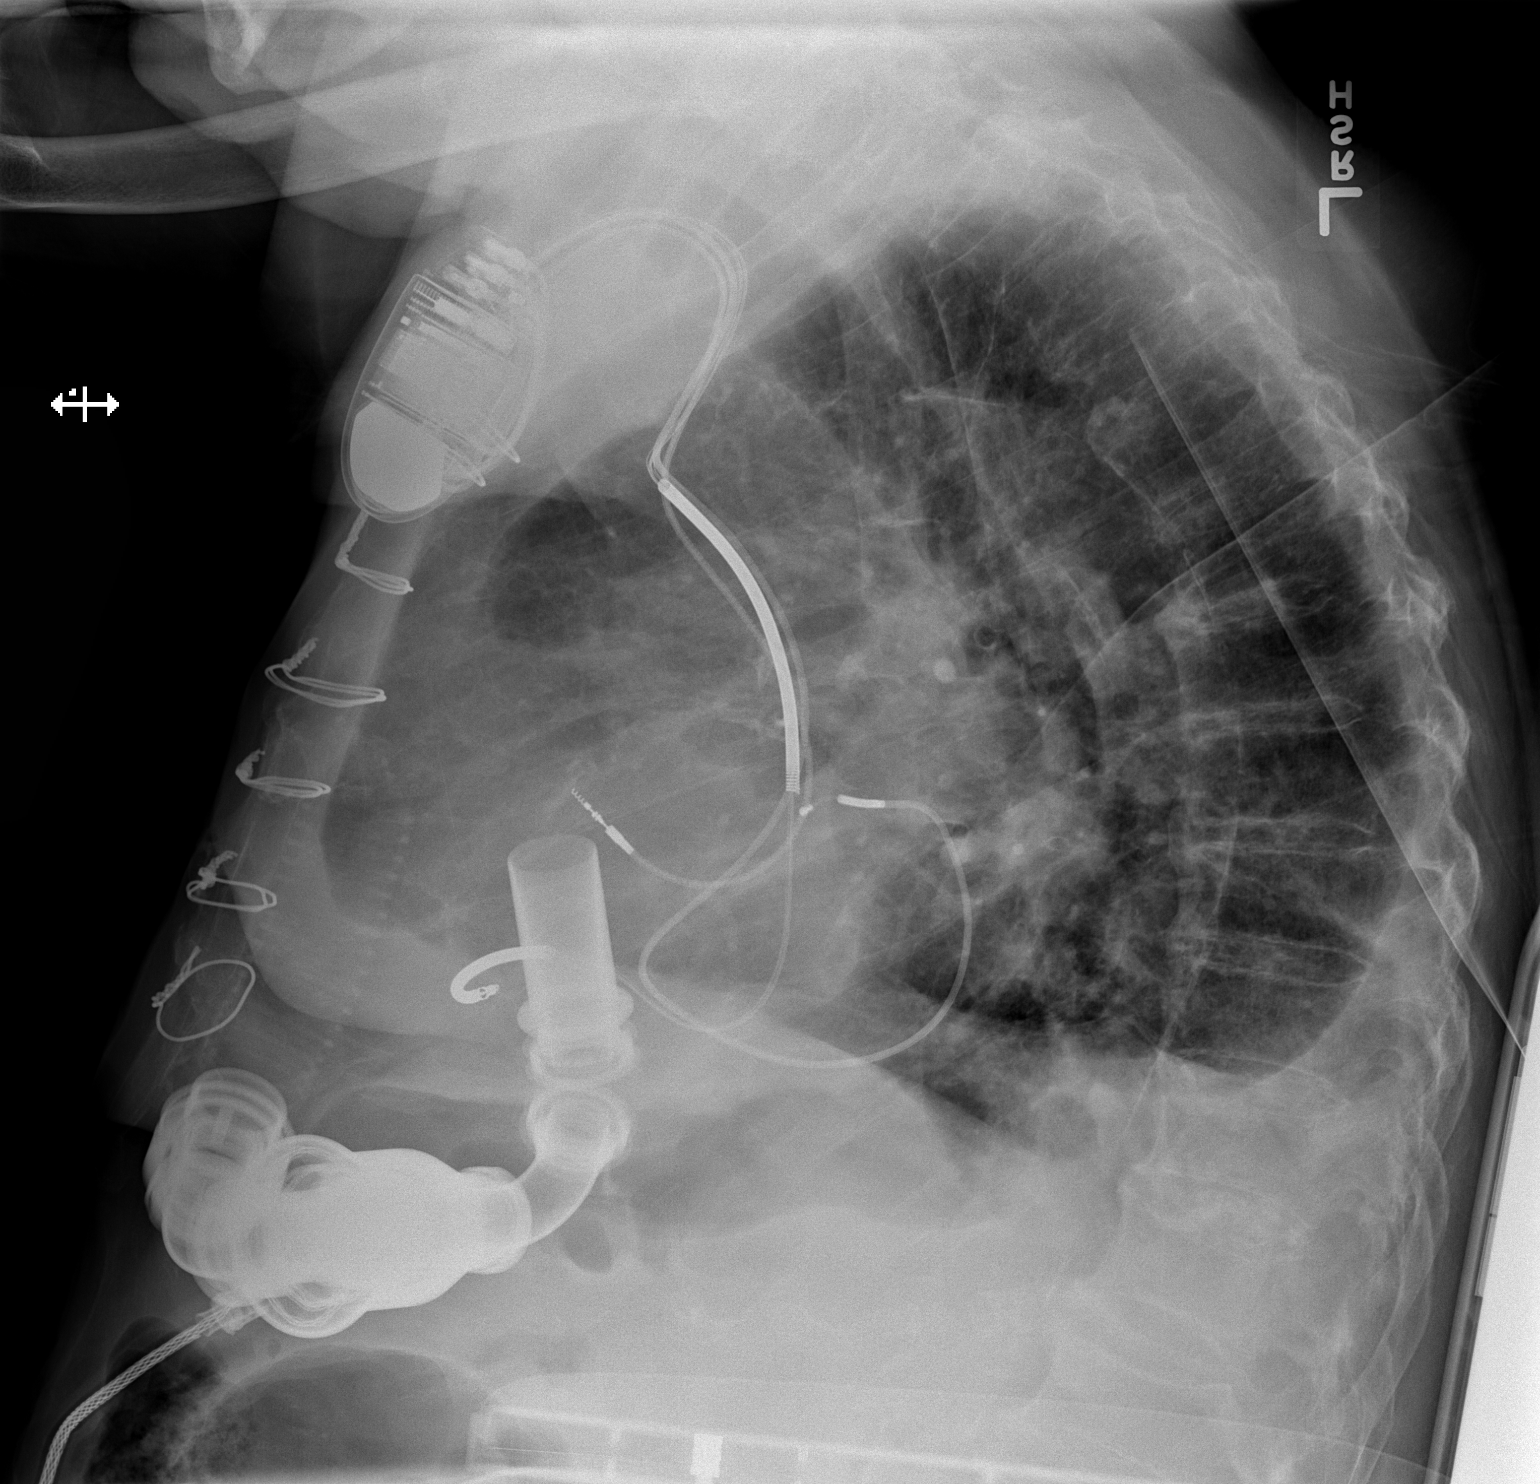

[2 of 2 positions shown; findings below may reference images not displayed]

FINDINGS: The lungs are adequately inflated. The interstitial markings are
mildly prominent. There are pleural effusions layering posteriorly
blunting the costophrenic angles. No definite pulmonary parenchymal
masses are observed. There is calcification in the wall of the
aortic arch. The cardiac silhouette remains enlarged. The pulmonary
vascularity is slightly more prominent today. The ICD is in stable
position. The left ventricular assist device is in reasonable
position where visualized. There is chronic deformity of the right
humeral head and neck.
IMPRESSION: Mild pulmonary edema, pulmonary vascular congestion, and
cardiomegaly consistent with mild CHF. The support devices are in
reasonable position.

Thoracic aortic atherosclerosis.

## 2018-12-26 IMAGING — CR DG CHEST 1V PORT
1 series · 1 of 1 positions shown · non-contrast
Comparison: April 25, 2017

CLINICAL DATA: Respiratory failure

EXAM:
PORTABLE CHEST 1 VIEW

[AP]
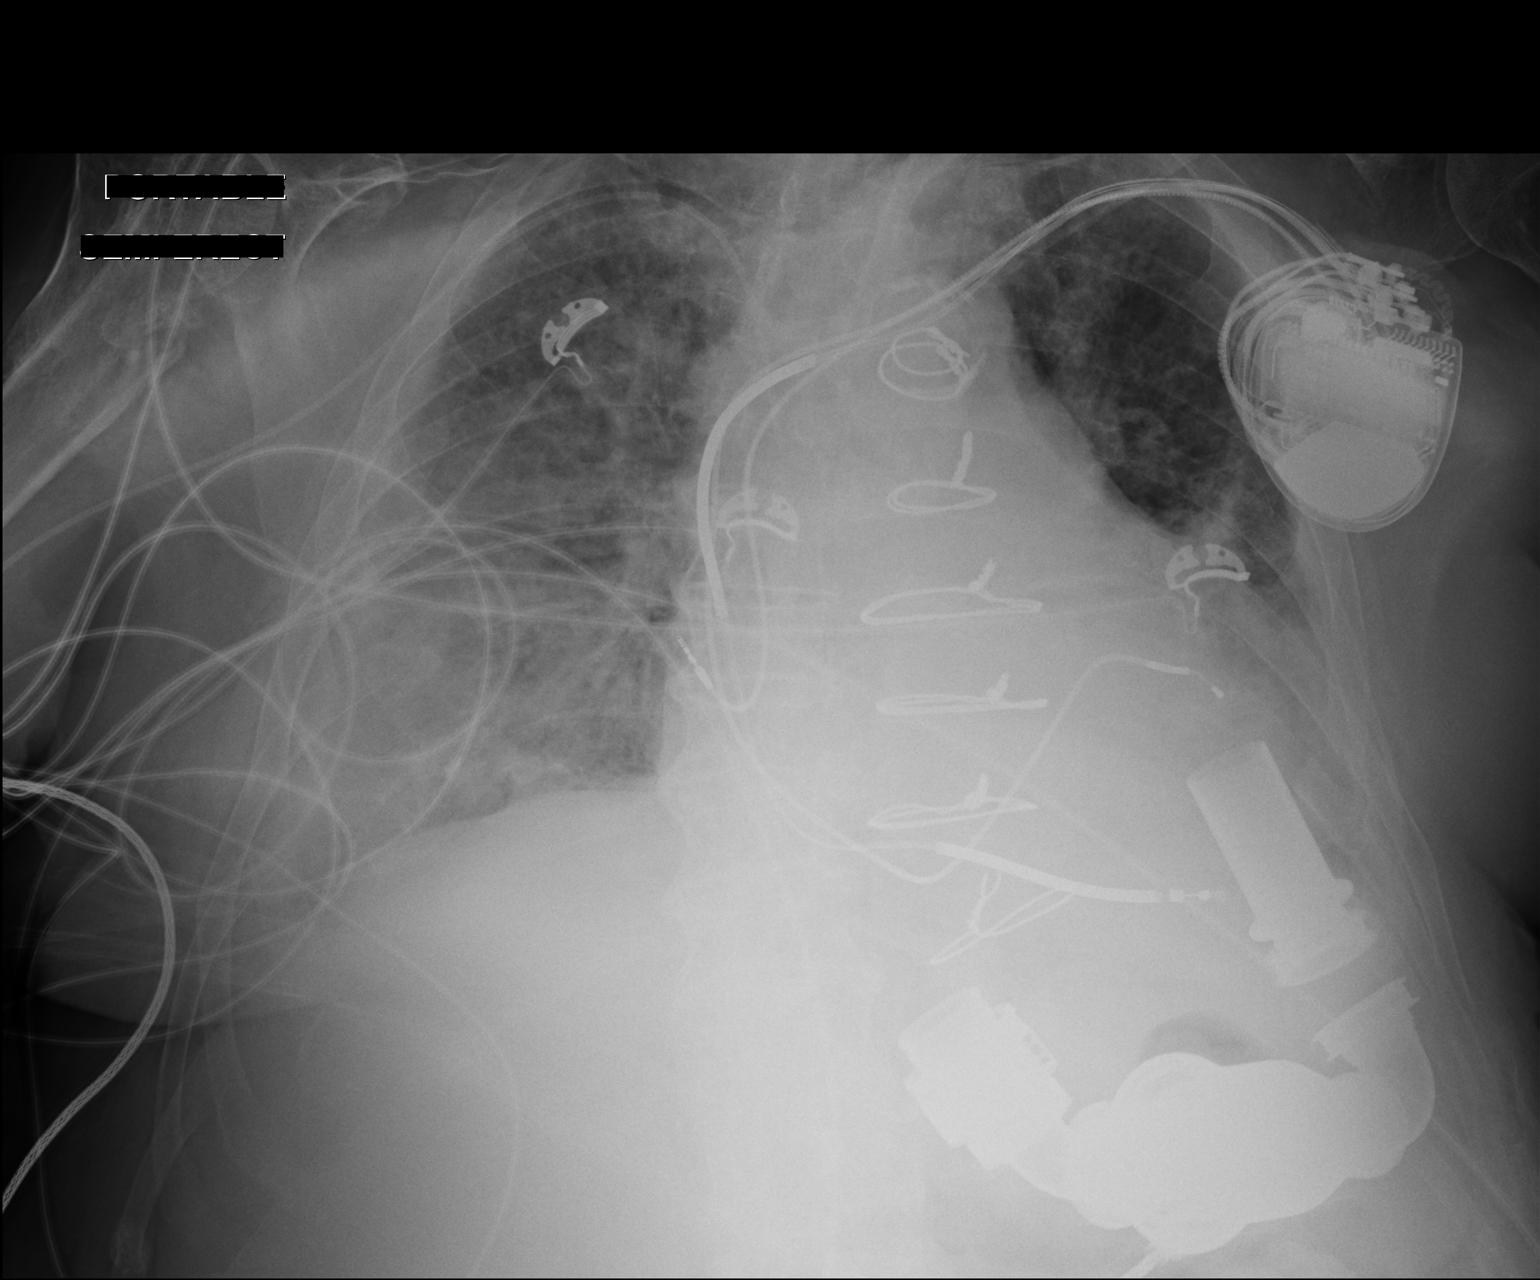

[1 of 1 positions shown; findings below may reference images not displayed]

FINDINGS: There is interstitial and alveolar opacity throughout much of the
right lung. There is persistent airspace consolidation in the left
lower lobe region with small pleural effusions bilaterally. There is
cardiomegaly with mild pulmonary venous hypertension. Pacemaker
leads are attached to the right atrium and right ventricle. There is
a left ventricular assist device. Central catheter tip is in the
superior vena cava. No evident adenopathy. There is aortic
atherosclerosis.
IMPRESSION: There is interstitial and alveolar opacity throughout much the right
lung as well as the left lower lobe. Small pleural effusions
bilaterally. Stable cardiac prominence. Suspect congestive heart
failure, although there may well be superimposed pneumonia in the
areas of alveolar opacity on each side. Both pneumonia and
congestive heart failure may present concurrently. The cardiac
silhouette is stable compared to the previous study. There is aortic
atherosclerosis.
